# Patient Record
Sex: Female | Born: 1960 | Race: White | Hispanic: No | Marital: Single | Smoking: Never smoker
Health system: Southern US, Community
[De-identification: ages and names within clinical notes are randomized; demographics above are authoritative.]

## PROBLEM LIST (undated history)

## (undated) DIAGNOSIS — F250 Schizoaffective disorder, bipolar type: Secondary | ICD-10-CM

## (undated) DIAGNOSIS — I639 Cerebral infarction, unspecified: Secondary | ICD-10-CM

## (undated) DIAGNOSIS — J449 Chronic obstructive pulmonary disease, unspecified: Secondary | ICD-10-CM

## (undated) DIAGNOSIS — I10 Essential (primary) hypertension: Secondary | ICD-10-CM

## (undated) DIAGNOSIS — F32A Depression, unspecified: Secondary | ICD-10-CM

## (undated) DIAGNOSIS — F209 Schizophrenia, unspecified: Secondary | ICD-10-CM

## (undated) DIAGNOSIS — G2401 Drug induced subacute dyskinesia: Secondary | ICD-10-CM

## (undated) DIAGNOSIS — E039 Hypothyroidism, unspecified: Secondary | ICD-10-CM

## (undated) DIAGNOSIS — F329 Major depressive disorder, single episode, unspecified: Secondary | ICD-10-CM

## (undated) HISTORY — PX: NO PAST SURGERIES: SHX2092

---

## 2008-01-21 ENCOUNTER — Emergency Department (HOSPITAL_BASED_OUTPATIENT_CLINIC_OR_DEPARTMENT_OTHER): Admission: EM | Admit: 2008-01-21 | Discharge: 2008-01-21 | Payer: Self-pay | Admitting: Emergency Medicine

## 2014-10-07 ENCOUNTER — Emergency Department (HOSPITAL_COMMUNITY)
Admission: EM | Admit: 2014-10-07 | Discharge: 2014-10-10 | Disposition: A | Discharge status: Home / Self Care | Payer: Medicare Other | Attending: Emergency Medicine | Admitting: Emergency Medicine

## 2014-10-07 ENCOUNTER — Encounter (HOSPITAL_COMMUNITY): Payer: Self-pay | Admitting: Emergency Medicine

## 2014-10-07 DIAGNOSIS — F29 Unspecified psychosis not due to a substance or known physiological condition: Secondary | ICD-10-CM | POA: Diagnosis not present

## 2014-10-07 DIAGNOSIS — Z79899 Other long term (current) drug therapy: Secondary | ICD-10-CM | POA: Diagnosis not present

## 2014-10-07 DIAGNOSIS — Z8673 Personal history of transient ischemic attack (TIA), and cerebral infarction without residual deficits: Secondary | ICD-10-CM | POA: Diagnosis not present

## 2014-10-07 DIAGNOSIS — Z8669 Personal history of other diseases of the nervous system and sense organs: Secondary | ICD-10-CM | POA: Diagnosis not present

## 2014-10-07 DIAGNOSIS — Z794 Long term (current) use of insulin: Secondary | ICD-10-CM | POA: Insufficient documentation

## 2014-10-07 DIAGNOSIS — J449 Chronic obstructive pulmonary disease, unspecified: Secondary | ICD-10-CM | POA: Insufficient documentation

## 2014-10-07 DIAGNOSIS — F200 Paranoid schizophrenia: Secondary | ICD-10-CM | POA: Diagnosis not present

## 2014-10-07 DIAGNOSIS — I10 Essential (primary) hypertension: Secondary | ICD-10-CM | POA: Insufficient documentation

## 2014-10-07 DIAGNOSIS — Z3202 Encounter for pregnancy test, result negative: Secondary | ICD-10-CM | POA: Diagnosis not present

## 2014-10-07 DIAGNOSIS — E039 Hypothyroidism, unspecified: Secondary | ICD-10-CM | POA: Diagnosis not present

## 2014-10-07 DIAGNOSIS — Z008 Encounter for other general examination: Secondary | ICD-10-CM | POA: Diagnosis present

## 2014-10-07 DIAGNOSIS — F329 Major depressive disorder, single episode, unspecified: Secondary | ICD-10-CM | POA: Insufficient documentation

## 2014-10-07 HISTORY — DX: Depression, unspecified: F32.A

## 2014-10-07 HISTORY — DX: Chronic obstructive pulmonary disease, unspecified: J44.9

## 2014-10-07 HISTORY — DX: Essential (primary) hypertension: I10

## 2014-10-07 HISTORY — DX: Hypothyroidism, unspecified: E03.9

## 2014-10-07 HISTORY — DX: Cerebral infarction, unspecified: I63.9

## 2014-10-07 HISTORY — DX: Drug induced subacute dyskinesia: G24.01

## 2014-10-07 HISTORY — DX: Major depressive disorder, single episode, unspecified: F32.9

## 2014-10-07 LAB — RAPID URINE DRUG SCREEN, HOSP PERFORMED
Amphetamines: NOT DETECTED
BARBITURATES: NOT DETECTED
Benzodiazepines: NOT DETECTED
Cocaine: NOT DETECTED
OPIATES: NOT DETECTED
TETRAHYDROCANNABINOL: NOT DETECTED

## 2014-10-07 LAB — CBC WITH DIFFERENTIAL/PLATELET
BASOS ABS: 0 10*3/uL (ref 0.0–0.1)
BASOS PCT: 0 % (ref 0–1)
EOS ABS: 0.2 10*3/uL (ref 0.0–0.7)
Eosinophils Relative: 2 % (ref 0–5)
HCT: 38.6 % (ref 36.0–46.0)
Hemoglobin: 13 g/dL (ref 12.0–15.0)
LYMPHS PCT: 34 % (ref 12–46)
Lymphs Abs: 4.1 10*3/uL — ABNORMAL HIGH (ref 0.7–4.0)
MCH: 32.4 pg (ref 26.0–34.0)
MCHC: 33.7 g/dL (ref 30.0–36.0)
MCV: 96.3 fL (ref 78.0–100.0)
MONOS PCT: 7 % (ref 3–12)
Monocytes Absolute: 0.9 10*3/uL (ref 0.1–1.0)
NEUTROS ABS: 6.8 10*3/uL (ref 1.7–7.7)
NEUTROS PCT: 57 % (ref 43–77)
Platelets: 245 10*3/uL (ref 150–400)
RBC: 4.01 MIL/uL (ref 3.87–5.11)
RDW: 13.1 % (ref 11.5–15.5)
WBC: 12 10*3/uL — ABNORMAL HIGH (ref 4.0–10.5)

## 2014-10-07 LAB — URINALYSIS, ROUTINE W REFLEX MICROSCOPIC
BILIRUBIN URINE: NEGATIVE
GLUCOSE, UA: NEGATIVE mg/dL
HGB URINE DIPSTICK: NEGATIVE
Ketones, ur: NEGATIVE mg/dL
Leukocytes, UA: NEGATIVE
NITRITE: NEGATIVE
PH: 5.5 (ref 5.0–8.0)
Protein, ur: NEGATIVE mg/dL
SPECIFIC GRAVITY, URINE: 1.009 (ref 1.005–1.030)
Urobilinogen, UA: 0.2 mg/dL (ref 0.0–1.0)

## 2014-10-07 LAB — COMPREHENSIVE METABOLIC PANEL
ALBUMIN: 4.4 g/dL (ref 3.5–5.0)
ALT: 15 U/L (ref 14–54)
AST: 21 U/L (ref 15–41)
Alkaline Phosphatase: 78 U/L (ref 38–126)
Anion gap: 11 (ref 5–15)
BILIRUBIN TOTAL: 0.7 mg/dL (ref 0.3–1.2)
BUN: 23 mg/dL — AB (ref 6–20)
CALCIUM: 9.7 mg/dL (ref 8.9–10.3)
CO2: 24 mmol/L (ref 22–32)
Chloride: 102 mmol/L (ref 101–111)
Creatinine, Ser: 0.91 mg/dL (ref 0.44–1.00)
GLUCOSE: 129 mg/dL — AB (ref 65–99)
Potassium: 4.3 mmol/L (ref 3.5–5.1)
Sodium: 137 mmol/L (ref 135–145)
TOTAL PROTEIN: 7.4 g/dL (ref 6.5–8.1)

## 2014-10-07 LAB — TSH: TSH: 15.183 u[IU]/mL — AB (ref 0.350–4.500)

## 2014-10-07 LAB — PREGNANCY, URINE: PREG TEST UR: NEGATIVE

## 2014-10-07 LAB — ACETAMINOPHEN LEVEL: Acetaminophen (Tylenol), Serum: <10 ug/mL — ABNORMAL LOW (ref 10–30)

## 2014-10-07 LAB — ETHANOL

## 2014-10-07 LAB — SALICYLATE LEVEL

## 2014-10-07 MED ORDER — LORATADINE 10 MG PO TABS
10.0000 mg | ORAL_TABLET | Freq: Every day | ORAL | Status: DC
  Administered 2014-10-08 – 2014-10-10 (×3): 10 mg via ORAL
  Filled 2014-10-07 (×2): qty 1

## 2014-10-07 MED ORDER — FLUPHENAZINE HCL 5 MG PO TABS
5.0000 mg | ORAL_TABLET | Freq: Two times a day (BID) | ORAL | Status: DC
  Administered 2014-10-07 – 2014-10-10 (×6): 5 mg via ORAL
  Filled 2014-10-07 (×8): qty 1

## 2014-10-07 MED ORDER — POTASSIUM CHLORIDE CRYS ER 10 MEQ PO TBCR
10.0000 meq | EXTENDED_RELEASE_TABLET | Freq: Every day | ORAL | Status: DC
  Administered 2014-10-07 – 2014-10-10 (×4): 10 meq via ORAL
  Filled 2014-10-07 (×4): qty 1

## 2014-10-07 MED ORDER — ZIPRASIDONE MESYLATE 20 MG IM SOLR
20.0000 mg | Freq: Once | INTRAMUSCULAR | Status: AC
  Administered 2014-10-07: 20 mg via INTRAMUSCULAR
  Filled 2014-10-07: qty 20

## 2014-10-07 MED ORDER — LEVOTHYROXINE SODIUM 100 MCG PO TABS
100.0000 ug | ORAL_TABLET | Freq: Every day | ORAL | Status: DC
  Administered 2014-10-08 – 2014-10-10 (×3): 100 ug via ORAL
  Filled 2014-10-07 (×4): qty 1

## 2014-10-07 MED ORDER — MIRTAZAPINE 30 MG PO TABS
15.0000 mg | ORAL_TABLET | Freq: Every day | ORAL | Status: DC
  Administered 2014-10-07 – 2014-10-09 (×3): 15 mg via ORAL
  Filled 2014-10-07 (×3): qty 1

## 2014-10-07 MED ORDER — FUROSEMIDE 40 MG PO TABS
20.0000 mg | ORAL_TABLET | Freq: Every day | ORAL | Status: DC
  Administered 2014-10-07 – 2014-10-10 (×4): 20 mg via ORAL
  Filled 2014-10-07: qty 1
  Filled 2014-10-07: qty 0.5
  Filled 2014-10-07: qty 1
  Filled 2014-10-07: qty 0.5

## 2014-10-07 MED ORDER — LORAZEPAM 2 MG/ML IJ SOLN
2.0000 mg | Freq: Once | INTRAMUSCULAR | Status: AC
  Administered 2014-10-07: 2 mg via INTRAMUSCULAR
  Filled 2014-10-07: qty 1

## 2014-10-07 MED ORDER — FLUPHENAZINE HCL 10 MG PO TABS
10.0000 mg | ORAL_TABLET | Freq: Every day | ORAL | Status: DC
  Administered 2014-10-07 – 2014-10-09 (×3): 10 mg via ORAL
  Filled 2014-10-07 (×4): qty 1

## 2014-10-07 MED ORDER — STERILE WATER FOR INJECTION IJ SOLN
INTRAMUSCULAR | Status: AC
  Administered 2014-10-07: 1.2 mL
  Filled 2014-10-07: qty 10

## 2014-10-07 MED ORDER — LORAZEPAM 1 MG PO TABS
1.0000 mg | ORAL_TABLET | Freq: Three times a day (TID) | ORAL | Status: DC | PRN
  Administered 2014-10-07 – 2014-10-10 (×3): 1 mg via ORAL
  Filled 2014-10-07 (×3): qty 1

## 2014-10-07 MED ORDER — ALBUTEROL SULFATE HFA 108 (90 BASE) MCG/ACT IN AERS
2.0000 | INHALATION_SPRAY | Freq: Four times a day (QID) | RESPIRATORY_TRACT | Status: DC | PRN
  Administered 2014-10-08: 2 via RESPIRATORY_TRACT
  Filled 2014-10-07: qty 6.7

## 2014-10-07 MED ORDER — FAMOTIDINE 20 MG PO TABS
20.0000 mg | ORAL_TABLET | Freq: Two times a day (BID) | ORAL | Status: DC
  Administered 2014-10-07 – 2014-10-10 (×7): 20 mg via ORAL
  Filled 2014-10-07 (×7): qty 1

## 2014-10-07 MED ORDER — LOSARTAN POTASSIUM 25 MG PO TABS
25.0000 mg | ORAL_TABLET | Freq: Every day | ORAL | Status: DC
  Administered 2014-10-08 – 2014-10-10 (×3): 25 mg via ORAL
  Filled 2014-10-07 (×3): qty 1

## 2014-10-07 MED ORDER — METFORMIN HCL 500 MG PO TABS
1000.0000 mg | ORAL_TABLET | Freq: Two times a day (BID) | ORAL | Status: DC
  Administered 2014-10-07 – 2014-10-10 (×7): 1000 mg via ORAL
  Filled 2014-10-07 (×8): qty 2

## 2014-10-07 MED ORDER — INSULIN GLARGINE 100 UNIT/ML ~~LOC~~ SOLN
15.0000 [IU] | Freq: Every day | SUBCUTANEOUS | Status: DC
  Administered 2014-10-08 – 2014-10-10 (×3): 15 [IU] via SUBCUTANEOUS
  Filled 2014-10-07 (×3): qty 0.15

## 2014-10-07 NOTE — ED Notes (Signed)
Pt defecated large stool on floor. Cleaned by environmental.
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
## 2014-10-07 NOTE — ED Notes (Signed)
Patient restless and coming out to nursing station. Staff redirected pt to room. No s/s of distress noted.
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
## 2014-10-07 NOTE — ED Notes (Signed)
Patient continues to scream, yell, and be in a state of high agitation. Patient ambulatory without difficulty.

## 2014-10-07 NOTE — ED Notes (Addendum)
GUARDIAN/ Judeth CornfieldSISTER- [facility]  HOME [phone] MOBILE [phone] WORK [phone]

## 2014-10-08 DIAGNOSIS — F29 Unspecified psychosis not due to a substance or known physiological condition: Secondary | ICD-10-CM | POA: Diagnosis not present

## 2014-10-08 LAB — CBG MONITORING, ED: Glucose-Capillary: 145 mg/dL — ABNORMAL HIGH (ref 65–99)

## 2014-10-08 NOTE — ED Notes (Signed)
MD and NP bedside

## 2014-10-09 DIAGNOSIS — F29 Unspecified psychosis not due to a substance or known physiological condition: Secondary | ICD-10-CM | POA: Insufficient documentation

## 2014-10-09 DIAGNOSIS — F200 Paranoid schizophrenia: Secondary | ICD-10-CM | POA: Diagnosis present

## 2014-10-09 LAB — CBG MONITORING, ED: Glucose-Capillary: 136 mg/dL — ABNORMAL HIGH (ref 65–99)
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
## 2014-10-09 NOTE — Progress Notes (Signed)
CSW contacted outside facilities which were all at capacity.  CSW faxed referrals to the following inpatient psych facilities that are at capacity, but taking referrals:  [facility]  Addelyn Alleman LCSW,LCAS Behavioral Health Disposition CSW [phone]   

## 2014-10-09 NOTE — ED Notes (Addendum)
Behavioral Health called to inquire if patient is on oxygen.  Informed caller patient is on room air.
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
## 2014-10-09 NOTE — ED Notes (Signed)
Patient ate 100% of her lunch. 

## 2014-10-09 NOTE — ED Notes (Signed)
Pt waiting on placement. Pt is wanting all her medications with the side effects and the reasons for pt to be on the medications. Pt would also like to see the doctor before 11am today. Pt calm at this time just demanding when talked to . Sitter at bedside. Educated pt that we would have the doctor come and talk to her just unsure on the time frame. I will also ask the pharmacy to send information on pts medications.

## 2014-10-09 NOTE — ED Notes (Signed)
Patient very verbal asking for lunch.  Informed patient it should be another 30 minutes before lunch arrives.  Patient states she will remind nurse in 30 minutes. Resting on stretcher.

## 2014-10-09 NOTE — ED Notes (Signed)
Pt ate 100% of her lunch at this time 

## 2014-10-10 DIAGNOSIS — F29 Unspecified psychosis not due to a substance or known physiological condition: Secondary | ICD-10-CM | POA: Diagnosis not present

## 2014-10-10 LAB — CBG MONITORING, ED
GLUCOSE-CAPILLARY: 127 mg/dL — AB (ref 65–99)
Glucose-Capillary: 104 mg/dL — ABNORMAL HIGH (ref 65–99)
Glucose-Capillary: 119 mg/dL — ABNORMAL HIGH (ref 65–99)
Glucose-Capillary: 134 mg/dL — ABNORMAL HIGH (ref 65–99)

## 2014-10-10 LAB — T4, FREE: Free T4: 1.08 ng/dL (ref 0.61–1.12)

## 2014-10-10 MED ORDER — ACETAMINOPHEN 325 MG PO TABS
650.0000 mg | ORAL_TABLET | Freq: Once | ORAL | Status: DC
  Filled 2014-10-10: qty 2

## 2014-10-10 NOTE — ED Notes (Signed)
[facility] called for transport request.

## 2014-10-10 NOTE — Consult Note (Signed)
[facility] Face-to-Face Psychiatry Consult   Reason for Consult:  Delusions Referring Physician:  EDP Patient Identification: Carrie Clark MRN:  [id number] Principal Diagnosis: Schizophrenia, paranoid Diagnosis:   Patient Active Problem List   Diagnosis Date Noted  . 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 Psychoses [F29]     Total Time spent with patient: 30 minutes  Subjective:   Carrie Clark is a 54 y.o. female patient does not warrant admission.  HPI:  The patient has stabilized.  Denies suicidal/homicidal ideations, hallucinations, and alcohol/drug abuse.  She is delusional at times over somatic concerns but not a threat to herself.  The psychiatrist feels she is stable to return to her assisted living, [facility]. HPI Elements:   Location:  generalized. Quality:  acute. Severity:  mild. Timing:  intermittent. Duration:  few days. Context:  stressors.  Past Medical History:  Past Medical History  Diagnosis Date  . Tardive dyskinesia   . COPD (chronic obstructive pulmonary disease)   . Depression   . Hypertension   . Hypothyroidism   . CVA (cerebral infarction)    History reviewed. No pertinent past surgical history. Family History: History reviewed. No pertinent family history. Social History:  History  Alcohol Use No     History  Drug Use No    History   Social History  . Marital Status: Unknown    Spouse Name: N/A  . Number of Children: N/A  . Years of Education: N/A   Social History Main Topics  . Smoking status: Never Smoker   . Smokeless tobacco: Not on file  . Alcohol Use: No  . Drug Use: No  . Sexual Activity: Not on file   Other Topics Concern  . None   Social History Narrative  . None   Additional Social History:    Pain Medications: none History of alcohol / drug use?: No history of alcohol / drug abuse                     Allergies:   Allergies  Allergen Reactions  . Fish Allergy Other (See Comments)   Allergy reaction not listed    Labs:  Results for orders placed or performed during the hospital encounter of 10/07/14 (from the past 48 hour(s))  CBG monitoring, ED     Status: Abnormal   Collection Time: 10/09/14  1:29 PM  Result Value Ref Range   Glucose-Capillary 136 (H) 65 - 99 mg/dL  POC CBG, ED     Status: Abnormal   Collection Time: 10/09/14  5:50 PM  Result Value Ref Range   Glucose-Capillary 127 (H) 65 - 99 mg/dL   Comment 1 Notify RN    Comment 2 Document in Chart   POC CBG, ED     Status: Abnormal   Collection Time: 10/10/14  7:26 AM  Result Value Ref Range   Glucose-Capillary 104 (H) 65 - 99 mg/dL   Comment 1 Notify RN   T4, free     Status: None   Collection Time: 10/10/14  9:34 AM  Result Value Ref Range   Free T4 1.08 0.61 - 1.12 ng/dL    Comment: Performed at [facility]  CBG monitoring, ED     Status: Abnormal   Collection Time: 10/10/14 11:57 AM  Result Value Ref Range   Glucose-Capillary 119 (H) 65 - 99 mg/dL   Comment 1 Notify RN     Vitals: Blood pressure 106/57, pulse 85, temperature  97.8 F (36.6 C), temperature source Oral, resp. rate 18, SpO2 98 %.  Risk to Self: Suicidal Ideation: No-Not Currently/Within Last 6 Months Suicidal Intent: No-Not Currently/Within Last 6 Months Is patient at risk for suicide?: No Suicidal Plan?: No-Not Currently/Within Last 6 Months Access to Means: No What has been your use of drugs/alcohol within the last 12 months?: none Intentional Self Injurious Behavior: None Risk to Others: Homicidal Ideation: No-Not Currently/Within Last 6 Months Thoughts of Harm to Others: No-Not Currently Present/Within Last 6 Months Current Homicidal Intent: No-Not Currently/Within Last 6 Months Current Homicidal Plan: No-Not Currently/Within Last 6 Months Access to Homicidal Means: No History of harm to others?: No Assessment of Violence: On admission Violent Behavior Description: yelling, screaming,  Does patient have access  to weapons?: No Criminal Charges Pending?: No Does patient have a court date: No Prior Inpatient Therapy: Prior Inpatient Therapy: Yes Prior Therapy Dates: unknown Prior Therapy Facilty/Provider(s): unknown Reason for Treatment: Schizophrenia Prior Outpatient Therapy: Prior Outpatient Therapy: Yes Prior Therapy Dates: current Prior Therapy Facilty/Provider(s): Dr. Rana SnareZanic Reason for Treatment: Schizophrenia Does patient have an ACCT team?: Unknown Does patient have Intensive In-House Services?  : No Does patient have [facility] services? : No Does patient have P4CC services?: No  Current Facility-Administered Medications  Medication Dose Route Frequency Provider Last Rate Last Dose  . acetaminophen (TYLENOL) tablet 650 mg  650 mg Oral Once Charm RingsJamison Y Lord, NP      . albuterol (PROVENTIL HFA;VENTOLIN HFA) 108 (90 BASE) MCG/ACT inhaler 2 puff  2 puff Inhalation Q6H PRN Benjiman CoreNathan Pickering, MD   2 puff at 10/08/14 2304  . famotidine (PEPCID) tablet 20 mg  20 mg Oral BID Benjiman CoreNathan Pickering, MD   20 mg at 10/10/14 0951  . fluPHENAZine (PROLIXIN) tablet 10 mg  10 mg Oral QHS Benjiman CoreNathan Pickering, MD   10 mg at 10/09/14 2122  . fluPHENAZine (PROLIXIN) tablet 5 mg  5 mg Oral BID Benjiman CoreNathan Pickering, MD   5 mg at 10/10/14 [id number]  . furosemide (LASIX) tablet 20 mg  20 mg Oral Daily Benjiman CoreNathan Pickering, MD   20 mg at 10/10/14 0951  . insulin glargine (LANTUS) injection 15 Units  15 Units Subcutaneous Daily Benjiman CoreNathan Pickering, MD   15 Units at 10/10/14 [phone]  . levothyroxine (SYNTHROID, LEVOTHROID) tablet 100 mcg  100 mcg Oral QAC breakfast Benjiman CoreNathan Pickering, MD   100 mcg at 10/10/14 [id number]  . loratadine (CLARITIN) tablet 10 mg  10 mg Oral Daily Benjiman CoreNathan Pickering, MD   10 mg at 10/10/14 [id number]  . LORazepam (ATIVAN) tablet 1 mg  1 mg Oral Q8H PRN Benjiman CoreNathan Pickering, MD   1 mg at 10/10/14 1628  . losartan (COZAAR) tablet 25 mg  25 mg Oral Daily Benjiman CoreNathan Pickering, MD   25 mg at 10/10/14 0807  . metFORMIN (GLUCOPHAGE) tablet 1,000 mg   1,000 mg Oral BID WC Benjiman CoreNathan Pickering, MD   1,000 mg at 10/10/14 1629  . mirtazapine (REMERON) tablet 15 mg  15 mg Oral QHS Benjiman CoreNathan Pickering, MD   15 mg at 10/09/14 2121  . potassium chloride (K-DUR,KLOR-CON) CR tablet 10 mEq  10 mEq Oral Daily Benjiman CoreNathan Pickering, MD   10 mEq at 10/10/14 [id number]   Current Outpatient Prescriptions  Medication Sig Dispense Refill  . albuterol (PROVENTIL HFA;VENTOLIN HFA) 108 (90 BASE) MCG/ACT inhaler Inhale 2 puffs into the lungs every 6 (six) hours as needed for wheezing or shortness of breath.    . cetirizine (ZYRTEC) 10 MG tablet Take 10 mg by  mouth daily.    . famotidine (PEPCID) 20 MG tablet Take 20 mg by mouth 2 (two) times daily.    . fluPHENAZine (PROLIXIN) 10 MG tablet Take 10 mg by mouth at bedtime.    . fluPHENAZine (PROLIXIN) 5 MG tablet Take 5 mg by mouth 2 (two) times daily.    . fluticasone (FLONASE) 50 MCG/ACT nasal spray Place 50 sprays into both nostrils 2 (two) times daily as needed.    . furosemide (LASIX) 20 MG tablet Take 20 mg by mouth daily.    Marland Kitchen. ibuprofen (ADVIL,MOTRIN) 400 MG tablet Take 400 mg by mouth 2 (two) times daily as needed for moderate pain.    Marland Kitchen. LANTUS 100 UNIT/ML injection Inject 100 mLs as directed daily.    Marland Kitchen. levothyroxine (SYNTHROID, LEVOTHROID) 50 MCG tablet Take 50 mcg by mouth daily.    Marland Kitchen. LORazepam (ATIVAN) 0.5 MG tablet Take 0.5 mg by mouth 3 times/day as needed-between meals & bedtime for anxiety or sedation.     Marland Kitchen. losartan (COZAAR) 25 MG tablet Take 25 mg by mouth daily.    . Menthol, Topical Analgesic, (BENGAY EX) Apply 1 application topically 3 (three) times daily as needed (neck pain).    . metFORMIN (GLUCOPHAGE) 1000 MG tablet Take 1,000 mg by mouth 2 (two) times daily.    . mirtazapine (REMERON) 15 MG tablet Take 15 mg by mouth at bedtime.    . Multiple Vitamins-Minerals (MULTIVITAMIN WITH MINERALS) tablet Take 1 tablet by mouth daily.    . ondansetron (ZOFRAN-ODT) 4 MG disintegrating tablet Take 4 mg by mouth 3  (three) times daily as needed.    . potassium chloride (K-DUR,KLOR-CON) 10 MEQ tablet Take 10 mEq by mouth daily.      Musculoskeletal: Strength & Muscle Tone: within normal limits Gait & Station: normal Patient leans: N/A  Psychiatric Specialty Exam: Physical Exam  Review of Systems  Constitutional: Negative.   HENT: Negative.   Eyes: Negative.   Respiratory: Negative.   Cardiovascular: Negative.   Gastrointestinal: Negative.   Genitourinary: Negative.   Musculoskeletal: Negative.   Skin: Negative.   Neurological: Negative.   Endo/Heme/Allergies: Negative.   Psychiatric/Behavioral: The patient has insomnia.     Blood pressure 106/57, pulse 85, temperature 97.8 F (36.6 C), temperature source Oral, resp. rate 18, SpO2 98 %.There is no height or weight on file to calculate BMI.  General Appearance: Casual  Eye Contact::  Good  Speech:  Normal Rate  Volume:  Normal  Mood:  Euthymic  Affect:  Blunt  Thought Process:  Coherent  Orientation:  Full (Time, Place, and Person)  Thought Content:  Delusions  Suicidal Thoughts:  No  Homicidal Thoughts:  No  Memory:  Immediate;   Fair Recent;   Fair Remote;   Fair  Judgement:  Fair  Insight:  Fair  Psychomotor Activity:  Normal  Concentration:  Good  Recall:  Good  Fund of Knowledge:  Good  Language: Good  Akathisia:  No  Handed:  Right  AIMS (if indicated):     Assets:  Financial Resources/Insurance Housing Leisure Time Resilience Social Support  ADL's:  Intact  Cognition: WNL  Sleep:      Medical Decision Making: Review of Psycho-Social Stressors (1), Review or order clinical lab tests (1) and Review of Medication Regimen & Side Effects (2)  Treatment Plan Summary: Daily contact with patient to assess and evaluate symptoms and progress in treatment, Medication management and Plan DIscharge to [facility], SNF with follow-up at her  regular providers  Plan:  No evidence of imminent risk to self or others at present.    Disposition: DIscharge to [facility], assisted living, with follow-up at her regular providers  Nanine Means, PMH-NP 10/10/2014 4:49 PM Patient seen face-to-face for psychiatric evaluation, chart reviewed and case discussed with the physician extender and developed treatment plan. Reviewed the information documented and agree with the treatment plan. Thedore Mins, MD

## 2014-10-10 NOTE — Progress Notes (Signed)
CSW completed FL2 for pt and [facility] through [facility].  [facility] and [facility] at [phone].  Trish MageBrittney Rodolphe Edmonston, LCSWA [phone] ED CSW 10/10/2014 7:03 PM

## 2014-10-10 NOTE — [facility] Counselor (Signed)
Called [facility] at [phone] to let them know about discharge plans- can call the same number for report.   Kateri PlummerKristin Dusten Ellinwood, M.S., LPCA, [place]ASA, [facility] Licensed Professional Counselor Associate  Triage Specialist  [facility]  Therapeutic Triage Services Phone: [phone] Fax: [phone]

## 2014-10-10 NOTE — ED Notes (Signed)
45 Patient pacing in room, coming out in nursing station asking for water or ice chips, patient instructed that is was night time, that she should lay down and take a nap, patient states " I 'll try"  At  [phone] Patient awake, has rested for a short time tonight talking to self at this time, laughing, clapping hands.

## 2014-10-10 NOTE — [facility] Suicide Risk Assessment (Signed)
Suicide Risk Assessment  Discharge [facility]   [facility]   Demographic Factors:  Caucasian  Total Time spent with patient: 30 minutes  Musculoskeletal: Strength & Muscle Tone: within normal limits Gait & Station: normal Patient leans: N/A  Psychiatric Specialty Exam: Physical Exam  Review of Systems  Constitutional: Negative.   HENT: Negative.   Eyes: Negative.   Respiratory: Negative.   Cardiovascular: Negative.   Gastrointestinal: Negative.   Genitourinary: Negative.   Musculoskeletal: Negative.   Skin: Negative.   Neurological: Negative.   Endo/Heme/Allergies: Negative.   Psychiatric/Behavioral: The patient has insomnia.     Blood pressure 106/57, pulse 85, temperature 97.8 F (36.6 C), temperature source Oral, resp. rate 18, SpO2 98 %.There is no height or weight on file to calculate BMI.  General Appearance: Casual  Eye Contact::  Good  Speech:  Normal Rate  Volume:  Normal  Mood:  Euthymic  Affect:  Blunt  Thought Process:  Coherent  Orientation:  Full (Time, Place, and Person)  Thought Content:  Delusions  Suicidal Thoughts:  No  Homicidal Thoughts:  No  Memory:  Immediate;   Fair Recent;   Fair Remote;   Fair  Judgement:  Fair  Insight:  Fair  Psychomotor Activity:  Normal  Concentration:  Good  Recall:  Good  Fund of Knowledge:  Good  Language: Good  Akathisia:  No  Handed:  Right  AIMS (if indicated):     Assets:  Financial Resources/Insurance Housing Leisure Time Resilience Social Support  ADL's:  Intact  Cognition: WNL  Sleep:      Has this patient used any form of tobacco in the last 30 days? (Cigarettes, Smokeless Tobacco, Cigars, and/or Pipes) No  Mental Status Per Nursing Assessment::   On Admission:   Delusional  Current Mental Status by Physician: NA  Loss Factors: NA  Historical Factors: NA  Risk Reduction Factors:   Sense of responsibility to family, Living with another person, especially  a relative, Positive social support and Positive therapeutic relationship  Continued Clinical Symptoms:  Somatic delusions at times  Cognitive Features That Contribute To Risk:  None    Suicide Risk:  Minimal: No identifiable suicidal ideation.  Patients presenting with no risk factors but with morbid ruminations; may be classified as minimal risk based on the severity of the depressive symptoms  Principal Problem: Schizophrenia, paranoid Discharge Diagnoses:  Patient Active Problem List   Diagnosis Date Noted  . 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 Psychoses [F29]       Plan Of Care/Follow-up recommendations:  Activity:  as tolerated Diet:  heart healthy  Is patient on multiple antipsychotic therapies at discharge:  No   Has Patient had three or more failed trials of antipsychotic monotherapy by history:  No  Recommended Plan for Multiple Antipsychotic Therapies: NA    Chad Donoghue, PMH-NP 10/10/2014, 4:55 PM

## 2014-10-10 NOTE — ED Notes (Addendum)
Pt is very pleasant reading the bible this am. Pt eats very fast and has to be reminded to slow down so she does not chock. Pt at times has garbled speech but this is normal for her. Pt is a 1:1 this am for safety.pt stated he has been drinking since the age of 54. Pt also stated he has not followed up with any of his referrals and is not sure why. He di tell the MD and NP he wants longterm treatment. Pt FSBS is 119 at 12noon. Pt remains with a sitter and remains a 1:1. Pt is concerned and fixated on her blood volume and having someone bring in a machine to check it.12:45p- Pt was given 650mg  of tylenol for a headache 6/10. Pt at times can be very loud and demanding;1:35pm-Pt stated her headache is gone.

## 2014-11-11 ENCOUNTER — Encounter (HOSPITAL_COMMUNITY): Payer: Self-pay

## 2014-11-11 ENCOUNTER — Emergency Department (HOSPITAL_COMMUNITY)
Admission: EM | Admit: 2014-11-11 | Discharge: 2014-11-11 | Disposition: A | Discharge status: Home / Self Care | Payer: Medicare Other | Attending: Emergency Medicine | Admitting: Emergency Medicine

## 2014-11-11 DIAGNOSIS — E039 Hypothyroidism, unspecified: Secondary | ICD-10-CM | POA: Diagnosis not present

## 2014-11-11 DIAGNOSIS — J449 Chronic obstructive pulmonary disease, unspecified: Secondary | ICD-10-CM | POA: Diagnosis not present

## 2014-11-11 DIAGNOSIS — M549 Dorsalgia, unspecified: Secondary | ICD-10-CM | POA: Insufficient documentation

## 2014-11-11 DIAGNOSIS — Z8673 Personal history of transient ischemic attack (TIA), and cerebral infarction without residual deficits: Secondary | ICD-10-CM | POA: Diagnosis not present

## 2014-11-11 DIAGNOSIS — R4689 Other symptoms and signs involving appearance and behavior: Secondary | ICD-10-CM | POA: Diagnosis present

## 2014-11-11 DIAGNOSIS — Z8669 Personal history of other diseases of the nervous system and sense organs: Secondary | ICD-10-CM | POA: Insufficient documentation

## 2014-11-11 DIAGNOSIS — Z79899 Other long term (current) drug therapy: Secondary | ICD-10-CM | POA: Insufficient documentation

## 2014-11-11 DIAGNOSIS — F200 Paranoid schizophrenia: Secondary | ICD-10-CM

## 2014-11-11 DIAGNOSIS — F918 Other conduct disorders: Secondary | ICD-10-CM | POA: Diagnosis not present

## 2014-11-11 DIAGNOSIS — I10 Essential (primary) hypertension: Secondary | ICD-10-CM | POA: Insufficient documentation

## 2014-11-11 DIAGNOSIS — Z794 Long term (current) use of insulin: Secondary | ICD-10-CM | POA: Diagnosis not present

## 2014-11-11 DIAGNOSIS — R45851 Suicidal ideations: Secondary | ICD-10-CM | POA: Diagnosis present

## 2014-11-11 LAB — COMPREHENSIVE METABOLIC PANEL
ALBUMIN: 3.9 g/dL (ref 3.5–5.0)
ALK PHOS: 86 U/L (ref 38–126)
ALT: 15 U/L (ref 14–54)
ANION GAP: 11 (ref 5–15)
AST: 19 U/L (ref 15–41)
BUN: 9 mg/dL (ref 6–20)
CO2: 25 mmol/L (ref 22–32)
Calcium: 9.7 mg/dL (ref 8.9–10.3)
Chloride: 103 mmol/L (ref 101–111)
Creatinine, Ser: 0.72 mg/dL (ref 0.44–1.00)
GFR calc Af Amer: >60 mL/min (ref >60)
GFR calc non Af Amer: >60 mL/min (ref >60)
Glucose, Bld: 169 mg/dL — ABNORMAL HIGH (ref 65–99)
POTASSIUM: 3.6 mmol/L (ref 3.5–5.1)
SODIUM: 139 mmol/L (ref 135–145)
TOTAL PROTEIN: 6.9 g/dL (ref 6.5–8.1)
Total Bilirubin: 0.3 mg/dL (ref 0.3–1.2)

## 2014-11-11 LAB — CBC
HCT: 38.4 % (ref 36.0–46.0)
Hemoglobin: 12.5 g/dL (ref 12.0–15.0)
MCH: 30.5 pg (ref 26.0–34.0)
MCHC: 32.6 g/dL (ref 30.0–36.0)
MCV: 93.7 fL (ref 78.0–100.0)
PLATELETS: 289 10*3/uL (ref 150–400)
RBC: 4.1 MIL/uL (ref 3.87–5.11)
RDW: 12.9 % (ref 11.5–15.5)
WBC: 11.2 10*3/uL — AB (ref 4.0–10.5)

## 2014-11-11 LAB — ETHANOL

## 2014-11-11 LAB — SALICYLATE LEVEL: Salicylate Lvl: <4 mg/dL (ref 2.8–30.0)

## 2014-11-11 LAB — ACETAMINOPHEN LEVEL

## 2014-11-11 MED ORDER — FLUTICASONE PROPIONATE 50 MCG/ACT NA SUSP: 50.0000 | Freq: Two times a day (BID) | NASAL | Status: DC | PRN

## 2014-11-11 MED ORDER — LOSARTAN POTASSIUM 25 MG PO TABS: 25.0000 mg | ORAL_TABLET | Freq: Every day | ORAL | Status: DC

## 2014-11-11 MED ORDER — LEVOTHYROXINE SODIUM 50 MCG PO TABS: 50.0000 ug | ORAL_TABLET | Freq: Every day | ORAL | Status: DC

## 2014-11-11 MED ORDER — CETIRIZINE HCL 10 MG PO TABS: 10.0000 mg | ORAL_TABLET | Freq: Every day | ORAL | Status: DC

## 2014-11-11 MED ORDER — FUROSEMIDE 20 MG PO TABS: 20.0000 mg | ORAL_TABLET | Freq: Every day | ORAL | Status: AC

## 2014-11-11 MED ORDER — NICOTINE 21 MG/24HR TD PT24
21.0000 mg | MEDICATED_PATCH | Freq: Every day | TRANSDERMAL | Status: DC
  Administered 2014-11-11: 21 mg via TRANSDERMAL
  Filled 2014-11-11: qty 1

## 2014-11-11 MED ORDER — ACETAMINOPHEN 325 MG PO TABS: 650.0000 mg | ORAL_TABLET | ORAL | Status: DC | PRN

## 2014-11-11 MED ORDER — MULTI-VITAMIN/MINERALS PO TABS: 1.0000 | ORAL_TABLET | Freq: Every day | ORAL | Status: DC

## 2014-11-11 MED ORDER — POTASSIUM CHLORIDE CRYS ER 10 MEQ PO TBCR
10.0000 meq | EXTENDED_RELEASE_TABLET | Freq: Every day | ORAL | Status: DC
Start: 2014-11-11 — End: 2023-10-09

## 2014-11-11 MED ORDER — LANTUS 100 UNIT/ML ~~LOC~~ SOLN: 1.0000 [IU] | Freq: Every day | SUBCUTANEOUS | Status: DC

## 2014-11-11 MED ORDER — ONDANSETRON HCL 4 MG PO TABS: 4.0000 mg | ORAL_TABLET | Freq: Three times a day (TID) | ORAL | Status: DC | PRN

## 2014-11-11 MED ORDER — NICOTINE 21 MG/24HR TD PT24: 21.0000 mg | MEDICATED_PATCH | Freq: Every day | TRANSDERMAL | Status: DC

## 2014-11-11 MED ORDER — METFORMIN HCL 1000 MG PO TABS: 1000.0000 mg | ORAL_TABLET | Freq: Two times a day (BID) | ORAL | Status: DC

## 2014-11-11 MED ORDER — FLUPHENAZINE HCL 10 MG PO TABS: 10.0000 mg | ORAL_TABLET | Freq: Every day | ORAL | Status: DC

## 2014-11-11 MED ORDER — FAMOTIDINE 20 MG PO TABS: 20.0000 mg | ORAL_TABLET | Freq: Two times a day (BID) | ORAL | Status: DC
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
## 2014-11-11 NOTE — ED Notes (Signed)
Pt presents with c/o suicidal thoughts. Per pt, she made some statements to the facility where she stays that she wanted to kill herself. Pt reports she does not have a plan, thoughts only. Pt reports she suffers from anxiety and depression as well.

## 2014-11-11 NOTE — ED Notes (Addendum)
Pt is asleep but does awaken when her name is called. She stated she wanted to wait on breakfast. Pt denies Si and HI and contracts for safety-pt is able to ambulate. Report was given to Angie and pt will be transported back to the pscy ER.
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
## 2014-11-11 NOTE — Consult Note (Signed)
Combine Psychiatry Consult   Reason for Consult:  Delusions, Suicidal Statements Referring Physician:  EDP Patient Identification: Carrie Clark MRN:  [id number] Principal Diagnosis: Schizophrenia, paranoid Diagnosis:   Patient Active Problem List   Diagnosis Date Noted  . Episode of behavior change [R68.89]     Priority: High  . 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 Psychoses [F29]     Total Time spent with patient: 30 minutes  Subjective:   Carrie Clark is a 54 y.o. female patient does not warrant admission. Pt seen and chart reviewed with this NP and Dr. Darleene Cleaver. Pt denies suicidal/homicidal ideation and psychosis and does not appear to be responding to internal stimuli at this time. Pt reports that she was upset, but is not upset any longer. Pt at baseline for discharge at this time.   HPI: The patient has stabilized.  Denies suicidal/homicidal ideations, hallucinations, and alcohol/drug abuse.  She is delusional at times over somatic concerns, but pt is as she was last time, in that she is not a threat to herself or others at this time. Pt is from [facility] and will sometimes act out there and require evaluation to rule out necessity of psychiatric admission.    HPI Elements:   Location:  generalized. Quality:  acute. Severity:  mild. Timing:  intermittent. Duration:  few days. Context:  stressors.  Past Medical History:  Past Medical History  Diagnosis Date  . Tardive dyskinesia   . COPD (chronic obstructive pulmonary disease)   . Depression   . Hypertension   . Hypothyroidism   . CVA (cerebral infarction)    History reviewed. No pertinent past surgical history. Family History: No family history on file. Social History:  History  Alcohol Use No     History  Drug Use No    History   Social History  . Marital Status: Unknown    Spouse Name: N/A  . Number of Children: N/A  . Years of Education: N/A   Social History Main  Topics  . Smoking status: Never Smoker   . Smokeless tobacco: Not on file  . Alcohol Use: No  . Drug Use: No  . Sexual Activity: Not on file   Other Topics Concern  . None   Social History Narrative   Additional Social History:                          Allergies:   Allergies  Allergen Reactions  . Fish Allergy Other (See Comments)    Allergy reaction not listed    Labs:  Results for orders placed or performed during the hospital encounter of 11/11/14 (from the past 48 hour(s))  Acetaminophen level     Status: Abnormal   Collection Time: 11/11/14  2:20 AM  Result Value Ref Range   Acetaminophen (Tylenol), Serum <10 (L) 10 - 30 ug/mL    Comment:        THERAPEUTIC CONCENTRATIONS VARY SIGNIFICANTLY. A RANGE OF 10-30 ug/mL MAY BE AN EFFECTIVE CONCENTRATION FOR MANY PATIENTS. HOWEVER, SOME ARE BEST TREATED AT CONCENTRATIONS OUTSIDE THIS RANGE. ACETAMINOPHEN CONCENTRATIONS >150 ug/mL AT 4 HOURS AFTER INGESTION AND >50 ug/mL AT 12 HOURS AFTER INGESTION ARE OFTEN ASSOCIATED WITH TOXIC REACTIONS.   CBC     Status: Abnormal   Collection Time: 11/11/14  2:20 AM  Result Value Ref Range   WBC 11.2 (H) 4.0 - 10.5 K/uL   RBC 4.10 3.87 - 5.11  MIL/uL   Hemoglobin 12.5 12.0 - 15.0 g/dL   HCT 38.4 36.0 - 46.0 %   MCV 93.7 78.0 - 100.0 fL   MCH 30.5 26.0 - 34.0 pg   MCHC 32.6 30.0 - 36.0 g/dL   RDW 12.9 11.5 - 15.5 %   Platelets 289 150 - 400 K/uL  Comprehensive metabolic panel     Status: Abnormal   Collection Time: 11/11/14  2:20 AM  Result Value Ref Range   Sodium 139 135 - 145 mmol/L   Potassium 3.6 3.5 - 5.1 mmol/L   Chloride 103 101 - 111 mmol/L   CO2 25 22 - 32 mmol/L   Glucose, Bld 169 (H) 65 - 99 mg/dL   BUN 9 6 - 20 mg/dL   Creatinine, Ser 0.72 0.44 - 1.00 mg/dL   Calcium 9.7 8.9 - 10.3 mg/dL   Total Protein 6.9 6.5 - 8.1 g/dL   Albumin 3.9 3.5 - 5.0 g/dL   AST 19 15 - 41 U/L   ALT 15 14 - 54 U/L   Alkaline Phosphatase 86 38 - 126 U/L   Total  Bilirubin 0.3 0.3 - 1.2 mg/dL   GFR calc non Af Amer >60 >60 mL/min   GFR calc Af Amer >60 >60 mL/min    Comment: (NOTE) The eGFR has been calculated using the CKD EPI equation. This calculation has not been validated in all clinical situations. eGFR's persistently <60 mL/min signify possible Chronic Kidney Disease.    Anion gap 11 5 - 15  Ethanol (ETOH)     Status: None   Collection Time: 11/11/14  2:20 AM  Result Value Ref Range   Alcohol, Ethyl (B) <5 <5 mg/dL    Comment:        LOWEST DETECTABLE LIMIT FOR SERUM ALCOHOL IS 5 mg/dL FOR MEDICAL PURPOSES ONLY   Salicylate level     Status: None   Collection Time: 11/11/14  2:20 AM  Result Value Ref Range   Salicylate Lvl <4.0 2.8 - 30.0 mg/dL    Vitals: Blood pressure 122/88, pulse 78, temperature 98.4 F (36.9 C), temperature source Oral, resp. rate 18, SpO2 100 %.  Risk to Self: Is patient at risk for suicide?: Yes Risk to Others:   Prior Inpatient Therapy:   Prior Outpatient Therapy:    Current Facility-Administered Medications  Medication Dose Route Frequency Provider Last Rate Last Dose  . acetaminophen (TYLENOL) tablet 650 mg  650 mg Oral Q4H PRN Antonietta Breach, PA-C      . nicotine (NICODERM CQ - dosed in mg/24 hours) patch 21 mg  21 mg Transdermal Daily Antonietta Breach, PA-C   21 mg at 11/11/14 0854  . ondansetron (ZOFRAN) tablet 4 mg  4 mg Oral Q8H PRN Antonietta Breach, PA-C       Current Outpatient Prescriptions  Medication Sig Dispense Refill  . albuterol (PROVENTIL HFA;VENTOLIN HFA) 108 (90 BASE) MCG/ACT inhaler Inhale 2 puffs into the lungs every 6 (six) hours as needed for wheezing or shortness of breath.    Marland Kitchen ibuprofen (ADVIL,MOTRIN) 400 MG tablet Take 400 mg by mouth 2 (two) times daily as needed for moderate pain.    Marland Kitchen LORazepam (ATIVAN) 0.5 MG tablet Take 0.5 mg by mouth 3 times/day as needed-between meals & bedtime for anxiety or sedation.     . ondansetron (ZOFRAN-ODT) 4 MG disintegrating tablet Take 4 mg by mouth  3 (three) times daily as needed for nausea.     . cetirizine (ZYRTEC) 10 MG tablet  Take 1 tablet (10 mg total) by mouth daily.    . famotidine (PEPCID) 20 MG tablet Take 1 tablet (20 mg total) by mouth 2 (two) times daily.    . fluPHENAZine (PROLIXIN) 10 MG tablet Take 1 tablet (10 mg total) by mouth at bedtime.    . fluticasone (FLONASE) 50 MCG/ACT nasal spray Place 50 sprays into both nostrils 2 (two) times daily as needed.    . furosemide (LASIX) 20 MG tablet Take 1 tablet (20 mg total) by mouth daily. 30 tablet   . LANTUS 100 UNIT/ML injection Inject 0.01 mLs (1 Units total) into the skin daily. And titrate dose as directed by prescriber 10 mL   . levothyroxine (SYNTHROID, LEVOTHROID) 50 MCG tablet Take 1 tablet (50 mcg total) by mouth daily.    Marland Kitchen losartan (COZAAR) 25 MG tablet Take 1 tablet (25 mg total) by mouth daily.    . metFORMIN (GLUCOPHAGE) 1000 MG tablet Take 1 tablet (1,000 mg total) by mouth 2 (two) times daily.    . Multiple Vitamins-Minerals (MULTIVITAMIN WITH MINERALS) tablet Take 1 tablet by mouth daily.    . nicotine (NICODERM CQ - DOSED IN MG/24 HOURS) 21 mg/24hr patch Place 1 patch (21 mg total) onto the skin daily. 28 patch 0  . potassium chloride (K-DUR,KLOR-CON) 10 MEQ tablet Take 1 tablet (10 mEq total) by mouth daily.      Musculoskeletal: Strength & Muscle Tone: within normal limits Gait & Station: normal Patient leans: N/A  Psychiatric Specialty Exam: Physical Exam  Review of Systems  Constitutional: Negative.   HENT: Negative.   Eyes: Negative.   Respiratory: Negative.   Cardiovascular: Negative.   Gastrointestinal: Negative.   Genitourinary: Negative.   Musculoskeletal: Negative.   Skin: Negative.   Neurological: Negative.   Endo/Heme/Allergies: Negative.   Psychiatric/Behavioral: Positive for depression. Negative for suicidal ideas. The patient has insomnia.   All other systems reviewed and are negative.   Blood pressure 122/88, pulse 78,  temperature 98.4 F (36.9 C), temperature source Oral, resp. rate 18, SpO2 100 %.There is no height or weight on file to calculate BMI.  General Appearance: Casual  Eye Contact::  Good  Speech:  Normal Rate  Volume:  Normal  Mood:  Euthymic  Affect:  Blunt  Thought Process:  Coherent  Orientation:  Full (Time, Place, and Person)  Thought Content:  Delusions  Suicidal Thoughts:  No  Homicidal Thoughts:  No  Memory:  Immediate;   Fair Recent;   Fair Remote;   Fair  Judgement:  Fair  Insight:  Fair  Psychomotor Activity:  Normal  Concentration:  Good  Recall:  Good  Fund of Knowledge:  Good  Language: Good  Akathisia:  No  Handed:  Right  AIMS (if indicated):     Assets:  Financial Resources/Insurance Housing Leisure Time Resilience Social Support  ADL's:  Intact  Cognition: WNL  Sleep:      Medical Decision Making: Review of Psycho-Social Stressors (1), Review or order clinical lab tests (1) and Review of Medication Regimen & Side Effects (2)  Treatment Plan Summary: Schizophrenia, paranoid, improving acute episode of exacerbation; stable for discharge back to group home.   Daily contact with patient to assess and evaluate symptoms and progress in treatment, Medication management and Plan DIscharge to [facility], SNF with follow-up at her regular providers  Plan:  No evidence of imminent risk to self or others at present.     Disposition: -Discharge to [facility], assisted living -Continue  home meds without changes -Follow-up at her regular providers  Benjamine Mola, FNP-BC 11/11/2014 3:18 PM  Patient seen face-to-face for psychiatric evaluation, chart reviewed and case discussed with the physician extender and developed treatment plan. Reviewed the information documented and agree with the treatment plan. Corena Pilgrim, MD

## 2014-11-11 NOTE — ED Notes (Addendum)
Pt wanded, belongings searched by security. 

## 2014-11-11 NOTE — Progress Notes (Signed)
CSW was informed that by 1st shift ED CSW that Lurena Joiner from [facility] states that the patient does not need an FL2 due to her not being here more than 24 hours.  CSW spoke with Deanna Artis of [facility] who states that the pt is welcomed back, and that Dorthy from the facility will be here to pick up the pt upon discharge.  Trish Mage [phone] ED CSW 11/11/2014 4:23 PM

## 2014-11-11 NOTE — ED Notes (Signed)
Patient verbalized understanding of discharge instructions and follow up, no further questions at this time. Patient will be discharged back to [facility] once ride arrives.  Watson Robarge, Wyman Songster, RN

## 2014-11-11 NOTE — ED Notes (Signed)
Patient denies SI/HI and A/V hallucinations. Patient being transferred back to [facility], stated she is very happy to be going back.  Mahalie Kanner, Wyman Songster, RN

## 2014-11-11 NOTE — ED Notes (Signed)
Pt in room and not talking at this time. Pt became tearful,when asked what was wrong, pt states "I rather not say". Pt not answering any other questions at this time.

## 2014-11-11 NOTE — ED Notes (Signed)
Patient denies SI/HI and A/V hallucinations. Patient gives minimal response, is isolative to her room, flat affect and presents as depressed. Patient kept closing her eyes and gave doctor minimal response while he was trying to talk to her.  Sylvestre Rathgeber, Wyman Songster, RN

## 2014-11-11 NOTE — BH Assessment (Addendum)
Spoke with Carollee Herter RN, who reports pt is keeping her head covered and not speaking. She will contact this writer if pt appears ready to be assessed.    Attempted again to assess pt, she would not remove blanket from her head, would not respond to questions. She made growl/snoring sounds when questions were asked. Carollee Herter RN reports pt informed her she was from [facility]. When asked if she was thinking of harming herself pt only responded with "I'd rather no say," and then did not communicate with staff again. Carollee Herter RN will contact this Clinical research associate by [phone] if pt awakens more fully at morning vitals.     Clista Bernhardt, [facility] Triage Specialist 11/11/2014 4:28 AM

## 2014-11-17 ENCOUNTER — Emergency Department (HOSPITAL_COMMUNITY)
Admission: EM | Admit: 2014-11-17 | Discharge: 2014-11-17 | Disposition: A | Discharge status: Home / Self Care | Payer: Medicare Other | Attending: Emergency Medicine | Admitting: Emergency Medicine

## 2014-11-17 ENCOUNTER — Encounter (HOSPITAL_COMMUNITY): Payer: Self-pay | Admitting: *Deleted

## 2014-11-17 DIAGNOSIS — J449 Chronic obstructive pulmonary disease, unspecified: Secondary | ICD-10-CM | POA: Diagnosis not present

## 2014-11-17 DIAGNOSIS — I10 Essential (primary) hypertension: Secondary | ICD-10-CM | POA: Insufficient documentation

## 2014-11-17 DIAGNOSIS — R112 Nausea with vomiting, unspecified: Secondary | ICD-10-CM

## 2014-11-17 DIAGNOSIS — Z794 Long term (current) use of insulin: Secondary | ICD-10-CM | POA: Diagnosis not present

## 2014-11-17 DIAGNOSIS — E039 Hypothyroidism, unspecified: Secondary | ICD-10-CM | POA: Insufficient documentation

## 2014-11-17 DIAGNOSIS — Z79899 Other long term (current) drug therapy: Secondary | ICD-10-CM | POA: Insufficient documentation

## 2014-11-17 DIAGNOSIS — Z8669 Personal history of other diseases of the nervous system and sense organs: Secondary | ICD-10-CM | POA: Diagnosis not present

## 2014-11-17 DIAGNOSIS — Z8673 Personal history of transient ischemic attack (TIA), and cerebral infarction without residual deficits: Secondary | ICD-10-CM | POA: Diagnosis not present

## 2014-11-17 LAB — CBC WITH DIFFERENTIAL/PLATELET
BASOS PCT: 0 % (ref 0–1)
Basophils Absolute: 0 10*3/uL (ref 0.0–0.1)
EOS ABS: 0.3 10*3/uL (ref 0.0–0.7)
Eosinophils Relative: 3 % (ref 0–5)
HCT: 37.2 % (ref 36.0–46.0)
Hemoglobin: 12.3 g/dL (ref 12.0–15.0)
Lymphocytes Relative: 43 % (ref 12–46)
Lymphs Abs: 5 10*3/uL — ABNORMAL HIGH (ref 0.7–4.0)
MCH: 31 pg (ref 26.0–34.0)
MCHC: 33.1 g/dL (ref 30.0–36.0)
MCV: 93.7 fL (ref 78.0–100.0)
Monocytes Absolute: 0.8 10*3/uL (ref 0.1–1.0)
Monocytes Relative: 7 % (ref 3–12)
Neutro Abs: 5.6 10*3/uL (ref 1.7–7.7)
Neutrophils Relative %: 47 % (ref 43–77)
PLATELETS: 285 10*3/uL (ref 150–400)
RBC: 3.97 MIL/uL (ref 3.87–5.11)
RDW: 13 % (ref 11.5–15.5)
WBC: 11.7 10*3/uL — ABNORMAL HIGH (ref 4.0–10.5)

## 2014-11-17 LAB — COMPREHENSIVE METABOLIC PANEL
ALBUMIN: 3.6 g/dL (ref 3.5–5.0)
ALK PHOS: 78 U/L (ref 38–126)
ALT: 12 U/L — AB (ref 14–54)
AST: 20 U/L (ref 15–41)
Anion gap: 11 (ref 5–15)
BUN: 10 mg/dL (ref 6–20)
CO2: 27 mmol/L (ref 22–32)
CREATININE: 0.7 mg/dL (ref 0.44–1.00)
Calcium: 9.4 mg/dL (ref 8.9–10.3)
Chloride: 99 mmol/L — ABNORMAL LOW (ref 101–111)
GFR calc Af Amer: >60 mL/min (ref >60)
Glucose, Bld: 113 mg/dL — ABNORMAL HIGH (ref 65–99)
POTASSIUM: 3.3 mmol/L — AB (ref 3.5–5.1)
SODIUM: 137 mmol/L (ref 135–145)
Total Bilirubin: 0.5 mg/dL (ref 0.3–1.2)
Total Protein: 6.5 g/dL (ref 6.5–8.1)

## 2014-11-17 LAB — URINE MICROSCOPIC-ADD ON

## 2014-11-17 LAB — URINALYSIS, ROUTINE W REFLEX MICROSCOPIC
BILIRUBIN URINE: NEGATIVE
Glucose, UA: NEGATIVE mg/dL
HGB URINE DIPSTICK: NEGATIVE
Ketones, ur: NEGATIVE mg/dL
Nitrite: NEGATIVE
Protein, ur: NEGATIVE mg/dL
Specific Gravity, Urine: 1.002 — ABNORMAL LOW (ref 1.005–1.030)
Urobilinogen, UA: 0.2 mg/dL (ref 0.0–1.0)
pH: 6 (ref 5.0–8.0)

## 2014-11-17 LAB — CBG MONITORING, ED: Glucose-Capillary: 105 mg/dL — ABNORMAL HIGH (ref 65–99)

## 2014-11-17 LAB — LIPASE, BLOOD: Lipase: 22 U/L (ref 22–51)

## 2014-11-17 MED ORDER — SODIUM CHLORIDE 0.9 % IV BOLUS (SEPSIS)
1000.0000 mL | Freq: Once | INTRAVENOUS | Status: AC
Start: 2014-11-17 — End: 2014-11-17
  Administered 2014-11-17: 1000 mL via INTRAVENOUS

## 2014-11-17 MED ORDER — ONDANSETRON HCL 4 MG/2ML IJ SOLN
4.0000 mg | Freq: Once | INTRAMUSCULAR | Status: AC
  Administered 2014-11-17: 4 mg via INTRAVENOUS
  Filled 2014-11-17: qty 2

## 2014-11-17 NOTE — ED Notes (Signed)
Bed: FE07 Expected date: 11/17/14 Expected time: 12:26 AM Means of arrival: Ambulance Comments: Vomiting, hypotensive

## 2014-11-17 NOTE — ED Notes (Signed)
Pt states " I just don't feel good", pt unable to tell what is wrong, asking for blood to be checked, pt also asking for snack.

## 2014-11-17 NOTE — ED Notes (Signed)
Pt stating she is not nauseous. Requesting a snack.

## 2014-11-17 NOTE — ED Notes (Signed)
Per EMS pt from [facility], states she's been having vomiting, low blood pressure, low blood sugar, and blood in urine x 2 weeks, BP 110/70, CBG 124.

## 2014-11-17 NOTE — ED Notes (Signed)
[facility] called to transport pt back to [facility] 

## 2014-11-17 NOTE — ED Provider Notes (Signed)
CSN: [id number]     Arrival date & time 11/17/14  0038 History   First MD Initiated Contact with Patient 11/17/14 0046     Chief Complaint  Patient presents with  . Emesis     (Consider location/radiation/quality/duration/timing/severity/associated sxs/prior Treatment) Patient is a 54 y.o. female presenting with vomiting. The history is provided by the patient. No language interpreter was used.  Emesis Severity:  Moderate Duration:  3 days Timing:  Constant Number of daily episodes:  3 Quality:  Stomach contents Progression:  Unchanged Chronicity:  New Recent urination:  Normal Context: not post-tussive and not self-induced   Relieved by:  Nothing Worsened by:  Nothing tried Ineffective treatments:  None tried Associated symptoms: no abdominal pain, no arthralgias, no chills and no diarrhea   Risk factors: no alcohol use, no diabetes, not pregnant now, no sick contacts, no suspect food intake and no travel to endemic areas     Past Medical History  Diagnosis Date  . Tardive dyskinesia   . COPD (chronic obstructive pulmonary disease)   . Depression   . Hypertension   . Hypothyroidism   . CVA (cerebral infarction)    History reviewed. No pertinent past surgical history. No family history on file. History  Substance Use Topics  . Smoking status: Never Smoker   . Smokeless tobacco: Not on file  . Alcohol Use: No   OB History    No data available     Review of Systems  Constitutional: Negative for fever, chills and fatigue.  HENT: Negative for trouble swallowing.   Eyes: Negative for visual disturbance.  Respiratory: Negative for shortness of breath.   Cardiovascular: Negative for chest pain and palpitations.  Gastrointestinal: Positive for nausea and vomiting. Negative for abdominal pain and diarrhea.  Genitourinary: Negative for dysuria and difficulty urinating.  Musculoskeletal: Negative for arthralgias and neck pain.  Skin: Negative for color change.   Neurological: Negative for dizziness and weakness.  Psychiatric/Behavioral: Negative for dysphoric mood.      Allergies  Fish allergy  Home Medications   Prior to Admission medications   Medication Sig Start Date End Date Taking? Authorizing Provider  albuterol (PROVENTIL HFA;VENTOLIN HFA) 108 (90 BASE) MCG/ACT inhaler Inhale 2 puffs into the lungs every 6 (six) hours as needed for wheezing or shortness of breath.    Historical Provider, MD  cetirizine (ZYRTEC) 10 MG tablet Take 1 tablet (10 mg total) by mouth daily. 11/11/14   Beau Fanny, FNP  famotidine (PEPCID) 20 MG tablet Take 1 tablet (20 mg total) by mouth 2 (two) times daily. 11/11/14   Beau Fanny, FNP  fluPHENAZine (PROLIXIN) 10 MG tablet Take 1 tablet (10 mg total) by mouth at bedtime. 11/11/14   Beau Fanny, FNP  fluticasone (FLONASE) 50 MCG/ACT nasal spray Place 50 sprays into both nostrils 2 (two) times daily as needed. 11/11/14   Beau Fanny, FNP  furosemide (LASIX) 20 MG tablet Take 1 tablet (20 mg total) by mouth daily. 11/11/14   Beau Fanny, FNP  ibuprofen (ADVIL,MOTRIN) 400 MG tablet Take 400 mg by mouth 2 (two) times daily as needed for moderate pain.    Historical Provider, MD  LANTUS 100 UNIT/ML injection Inject 0.01 mLs (1 Units total) into the skin daily. And titrate dose as directed by prescriber 11/11/14   Beau Fanny, FNP  levothyroxine (SYNTHROID, LEVOTHROID) 50 MCG tablet Take 1 tablet (50 mcg total) by mouth daily. 11/11/14   Beau Fanny, FNP  LORazepam (  ATIVAN) 0.5 MG tablet Take 0.5 mg by mouth 3 times/day as needed-between meals & bedtime for anxiety or sedation.  09/09/14   Historical Provider, MD  losartan (COZAAR) 25 MG tablet Take 1 tablet (25 mg total) by mouth daily. 11/11/14   Beau Fanny, FNP  metFORMIN (GLUCOPHAGE) 1000 MG tablet Take 1 tablet (1,000 mg total) by mouth 2 (two) times daily. 11/11/14   Beau Fanny, FNP  Multiple Vitamins-Minerals (MULTIVITAMIN WITH MINERALS) tablet  Take 1 tablet by mouth daily. 11/11/14   Beau Fanny, FNP  nicotine (NICODERM CQ - DOSED IN MG/24 HOURS) 21 mg/24hr patch Place 1 patch (21 mg total) onto the skin daily. 11/11/14   Beau Fanny, FNP  ondansetron (ZOFRAN-ODT) 4 MG disintegrating tablet Take 4 mg by mouth 3 (three) times daily as needed for nausea.  09/09/14   Historical Provider, MD  potassium chloride (K-DUR,KLOR-CON) 10 MEQ tablet Take 1 tablet (10 mEq total) by mouth daily. 11/11/14   Beau Fanny, FNP   There were no vitals taken for this visit. Physical Exam  Constitutional: She is oriented to person, place, and time. She appears well-developed and well-nourished. No distress.  HENT:  Head: Normocephalic and atraumatic.  Eyes: Conjunctivae and EOM are normal.  Neck: Normal range of motion.  Cardiovascular: Normal rate and regular rhythm.  Exam reveals no gallop and no friction rub.   No murmur heard. Pulmonary/Chest: Effort normal and breath sounds normal. She has no wheezes. She has no rales. She exhibits no tenderness.  Abdominal: Soft. She exhibits no distension. There is no tenderness. There is no rebound.  Musculoskeletal: Normal range of motion.  Neurological: She is alert and oriented to person, place, and time. Coordination normal.  Speech is goal-oriented. Moves limbs without ataxia.   Skin: Skin is warm and dry.  Psychiatric: She has a normal mood and affect. Her behavior is normal.  Nursing note and vitals reviewed.   ED Course  Procedures (including critical care time) Labs Review Labs Reviewed  CBC WITH DIFFERENTIAL/PLATELET - Abnormal; Notable for the following:    WBC 11.7 (*)    Lymphs Abs 5.0 (*)    All other components within normal limits  COMPREHENSIVE METABOLIC PANEL - Abnormal; Notable for the following:    Potassium 3.3 (*)    Chloride 99 (*)    Glucose, Bld 113 (*)    ALT 12 (*)    All other components within normal limits  URINALYSIS, ROUTINE W REFLEX MICROSCOPIC (NOT AT [facility]) -  Abnormal; Notable for the following:    APPearance CLOUDY (*)    Specific Gravity, Urine 1.002 (*)    Leukocytes, UA TRACE (*)    All other components within normal limits  CBG MONITORING, ED - Abnormal; Notable for the following:    Glucose-Capillary 105 (*)    All other components within normal limits  LIPASE, BLOOD  URINE MICROSCOPIC-ADD ON    Imaging Review No results found.   EKG Interpretation None      MDM   Final diagnoses:  Non-intractable vomiting with nausea, vomiting of unspecified type    2:46 AM Labs show mild elevation in WBC which are likely due to vomiting. I reviewed the labs and urinalysis which show no other acute changes. Vitals stable and patient afebrile. Patient reports her nausea has improved. Patient likely has a viral illness and will be discharged without further evaluation.    Emilia Beck, PA-C 11/17/14 0253  Richardean Canal, MD 11/17/14  0704 

## 2014-11-17 NOTE — ED Notes (Signed)
Pt ambulatory to restroom with steady gait to give urine sample.

## 2014-12-09 ENCOUNTER — Encounter (HOSPITAL_COMMUNITY): Payer: Self-pay | Admitting: Physical Medicine and Rehabilitation

## 2014-12-09 ENCOUNTER — Emergency Department (HOSPITAL_COMMUNITY)
Admission: EM | Admit: 2014-12-09 | Discharge: 2014-12-10 | Disposition: A | Discharge status: Skilled Nursing Facility | Payer: Medicare Other | Attending: Emergency Medicine | Admitting: Emergency Medicine

## 2014-12-09 ENCOUNTER — Emergency Department (HOSPITAL_COMMUNITY): Payer: Medicare Other

## 2014-12-09 DIAGNOSIS — Z8669 Personal history of other diseases of the nervous system and sense organs: Secondary | ICD-10-CM | POA: Insufficient documentation

## 2014-12-09 DIAGNOSIS — J449 Chronic obstructive pulmonary disease, unspecified: Secondary | ICD-10-CM | POA: Insufficient documentation

## 2014-12-09 DIAGNOSIS — I10 Essential (primary) hypertension: Secondary | ICD-10-CM | POA: Diagnosis not present

## 2014-12-09 DIAGNOSIS — Z79899 Other long term (current) drug therapy: Secondary | ICD-10-CM | POA: Diagnosis not present

## 2014-12-09 DIAGNOSIS — E039 Hypothyroidism, unspecified: Secondary | ICD-10-CM | POA: Diagnosis not present

## 2014-12-09 DIAGNOSIS — F209 Schizophrenia, unspecified: Secondary | ICD-10-CM | POA: Diagnosis not present

## 2014-12-09 DIAGNOSIS — Z8673 Personal history of transient ischemic attack (TIA), and cerebral infarction without residual deficits: Secondary | ICD-10-CM | POA: Diagnosis not present

## 2014-12-09 DIAGNOSIS — Z794 Long term (current) use of insulin: Secondary | ICD-10-CM | POA: Diagnosis not present

## 2014-12-09 DIAGNOSIS — R531 Weakness: Secondary | ICD-10-CM | POA: Diagnosis present

## 2014-12-09 HISTORY — DX: Schizophrenia, unspecified: F20.9

## 2014-12-09 LAB — CBC WITH DIFFERENTIAL/PLATELET
BASOS ABS: 0.1 10*3/uL (ref 0.0–0.1)
Basophils Relative: 1 % (ref 0–1)
Eosinophils Absolute: 0.3 10*3/uL (ref 0.0–0.7)
Eosinophils Relative: 3 % (ref 0–5)
HEMATOCRIT: 35.6 % — AB (ref 36.0–46.0)
HEMOGLOBIN: 12 g/dL (ref 12.0–15.0)
LYMPHS PCT: 33 % (ref 12–46)
Lymphs Abs: 3.6 10*3/uL (ref 0.7–4.0)
MCH: 31 pg (ref 26.0–34.0)
MCHC: 33.7 g/dL (ref 30.0–36.0)
MCV: 92 fL (ref 78.0–100.0)
MONO ABS: 0.7 10*3/uL (ref 0.1–1.0)
MONOS PCT: 7 % (ref 3–12)
Neutro Abs: 6.2 10*3/uL (ref 1.7–7.7)
Neutrophils Relative %: 56 % (ref 43–77)
Platelets: 291 10*3/uL (ref 150–400)
RBC: 3.87 MIL/uL (ref 3.87–5.11)
RDW: 12.7 % (ref 11.5–15.5)
WBC: 10.9 10*3/uL — ABNORMAL HIGH (ref 4.0–10.5)

## 2014-12-09 LAB — URINALYSIS, ROUTINE W REFLEX MICROSCOPIC
Bilirubin Urine: NEGATIVE
GLUCOSE, UA: NEGATIVE mg/dL
HGB URINE DIPSTICK: NEGATIVE
Ketones, ur: NEGATIVE mg/dL
Nitrite: NEGATIVE
Protein, ur: NEGATIVE mg/dL
Specific Gravity, Urine: 1.002 — ABNORMAL LOW (ref 1.005–1.030)
UROBILINOGEN UA: 0.2 mg/dL (ref 0.0–1.0)
pH: 5.5 (ref 5.0–8.0)

## 2014-12-09 LAB — URINE MICROSCOPIC-ADD ON

## 2014-12-09 LAB — CBG MONITORING, ED: GLUCOSE-CAPILLARY: 157 mg/dL — AB (ref 65–99)

## 2014-12-09 LAB — COMPREHENSIVE METABOLIC PANEL
ALT: 14 U/L (ref 14–54)
ANION GAP: 11 (ref 5–15)
AST: 21 U/L (ref 15–41)
Albumin: 3.6 g/dL (ref 3.5–5.0)
Alkaline Phosphatase: 69 U/L (ref 38–126)
BUN: 10 mg/dL (ref 6–20)
CO2: 23 mmol/L (ref 22–32)
Calcium: 9.3 mg/dL (ref 8.9–10.3)
Chloride: 99 mmol/L — ABNORMAL LOW (ref 101–111)
Creatinine, Ser: 0.71 mg/dL (ref 0.44–1.00)
GFR calc Af Amer: >60 mL/min (ref >60)
GFR calc non Af Amer: >60 mL/min (ref >60)
Glucose, Bld: 212 mg/dL — ABNORMAL HIGH (ref 65–99)
POTASSIUM: 4.3 mmol/L (ref 3.5–5.1)
Sodium: 133 mmol/L — ABNORMAL LOW (ref 135–145)
Total Bilirubin: 0.3 mg/dL (ref 0.3–1.2)
Total Protein: 5.9 g/dL — ABNORMAL LOW (ref 6.5–8.1)

## 2014-12-09 LAB — ETHANOL: Alcohol, Ethyl (B): <5 mg/dL (ref <5)

## 2014-12-09 MED ORDER — SODIUM CHLORIDE 0.9 % IV BOLUS (SEPSIS)
500.0000 mL | Freq: Once | INTRAVENOUS | Status: AC
  Administered 2014-12-09: 500 mL via INTRAVENOUS

## 2014-12-09 NOTE — ED Notes (Signed)
Tried to call report to [facility], phone line busy x2.

## 2014-12-09 NOTE — Discharge Instructions (Signed)

## 2014-12-09 NOTE — ED Notes (Signed)
[facility] has been contacted for pt. Pick up.

## 2014-12-09 NOTE — ED Notes (Signed)
Pt was found by security walking around hospital campus and brought back to ED lobby. Pt was diaphoretic and drooling. Vitals and CBG were rechecked. When asked why she left, pt responded, "I don't know." Pt has been asking for matches, ice cream, and food. Pt only given water at this time, per RN Danae OrleansNikki Simmons.

## 2014-12-09 NOTE — ED Notes (Addendum)
Pt gave tech a urine cup filled with water, pt informed that she needed to provide a urine specimen and verbalized understanding.

## 2014-12-09 NOTE — ED Notes (Signed)
Pt states she is ready to go, refusing to put BP cuff and be placed on heart monitor again.

## 2014-12-09 NOTE — ED Notes (Signed)
Pt presents to department for evaluation of generalized weakness. [facility] ED via [facility]. Pt states "I need a blood transfusion because I feel weak." pt is alert and oriented x4.

## 2014-12-09 NOTE — ED Provider Notes (Signed)
CSN: [id number]     Arrival date & time 12/09/14  1449 History   First MD Initiated Contact with Patient 12/09/14 1657     Chief Complaint  Patient presents with  . Weakness     (Consider location/radiation/quality/duration/timing/severity/associated sxs/prior Treatment) HPI Comments: Patient with a history of paranoid schizophrenia, tardive dyskinesia, diabetes mellitus and COPD presents with generalized weakness. She lives at [facility] assisted living facility. She was brought in by [facility] for generalized weakness. After she initially checked in, she apparently wandered off and was found by security walking outside the building. She was diaphoretic. She was brought back to the ED. When asked her why she left, she states that she just needs a little exercise. She states she feels a little weak after the walk. She feels generally weak. She feels a little bit achy all over. She denies any specific areas of pain. She denies any chest pain or shortness of breath. She has a little bit of nonproductive cough. She denies any urinary symptoms. She denies any nausea vomiting or diarrhea. She denies any hallucinations. She denies any suicidal ideations.  Patient is a 54 y.o. female presenting with weakness.  Weakness Pertinent negatives include no chest pain, no abdominal pain, no headaches and no shortness of breath.    Past Medical History  Diagnosis Date  . Tardive dyskinesia   . COPD (chronic obstructive pulmonary disease)   . Depression   . Hypertension   . Hypothyroidism   . CVA (cerebral infarction)   . Schizophrenia    History reviewed. No pertinent past surgical history. No family history on file. History  Substance Use Topics  . Smoking status: Never Smoker   . Smokeless tobacco: Not on file  . Alcohol Use: No   OB History    No data available     Review of Systems  Constitutional: Negative for fever, chills, diaphoresis and fatigue.  HENT: Negative for  congestion, rhinorrhea and sneezing.   Eyes: Negative.   Respiratory: Positive for cough. Negative for chest tightness and shortness of breath.   Cardiovascular: Negative for chest pain and leg swelling.  Gastrointestinal: Negative for nausea, vomiting, abdominal pain, diarrhea and blood in stool.  Genitourinary: Negative for frequency, hematuria, flank pain and difficulty urinating.  Musculoskeletal: Positive for myalgias. Negative for back pain and arthralgias.  Skin: Negative for rash.  Neurological: Positive for weakness. Negative for dizziness, speech difficulty, numbness and headaches.      Allergies  Fish allergy  Home Medications   Prior to Admission medications   Medication Sig Start Date End Date Taking? Authorizing Provider  albuterol (PROVENTIL HFA;VENTOLIN HFA) 108 (90 BASE) MCG/ACT inhaler Inhale 2 puffs into the lungs every 6 (six) hours as needed for wheezing or shortness of breath.   Yes Historical Provider, MD  cetirizine (ZYRTEC) 10 MG tablet Take 1 tablet (10 mg total) by mouth daily. 11/11/14  Yes Beau Fanny, FNP  famotidine (PEPCID) 20 MG tablet Take 1 tablet (20 mg total) by mouth 2 (two) times daily. 11/11/14  Yes Beau Fanny, FNP  fluPHENAZine (PROLIXIN) 5 MG tablet Take 5 mg by mouth daily.   Yes Historical Provider, MD  fluticasone (FLONASE) 50 MCG/ACT nasal spray Place 50 sprays into both nostrils 2 (two) times daily as needed. Patient taking differently: Place 50 sprays into both nostrils 2 (two) times daily as needed for allergies or rhinitis.  11/11/14  Yes Beau Fanny, FNP  furosemide (LASIX) 20 MG tablet Take  1 tablet (20 mg total) by mouth daily. 11/11/14  Yes Beau Fanny, FNP  ibuprofen (ADVIL,MOTRIN) 400 MG tablet Take 400 mg by mouth 2 (two) times daily as needed for moderate pain.   Yes Historical Provider, MD  levothyroxine (SYNTHROID, LEVOTHROID) 50 MCG tablet Take 1 tablet (50 mcg total) by mouth daily. 11/11/14  Yes Beau Fanny, FNP   lisinopril (PRINIVIL,ZESTRIL) 5 MG tablet Take 5 mg by mouth daily.   Yes Historical Provider, MD  LORazepam (ATIVAN) 0.5 MG tablet Take 0.5 mg by mouth 3 times/day as needed-between meals & bedtime for anxiety or sedation.  09/09/14  Yes Historical Provider, MD  metFORMIN (GLUCOPHAGE) 1000 MG tablet Take 1 tablet (1,000 mg total) by mouth 2 (two) times daily. 11/11/14  Yes Beau Fanny, FNP  Multiple Vitamins-Minerals (MULTIVITAMIN WITH MINERALS) tablet Take 1 tablet by mouth daily. 11/11/14  Yes Beau Fanny, FNP  nicotine (NICODERM CQ - DOSED IN MG/24 HOURS) 21 mg/24hr patch Place 1 patch (21 mg total) onto the skin daily. 11/11/14  Yes Beau Fanny, FNP  ondansetron (ZOFRAN-ODT) 4 MG disintegrating tablet Take 4 mg by mouth 3 (three) times daily as needed for nausea.  09/09/14  Yes Historical Provider, MD  potassium chloride (K-DUR,KLOR-CON) 10 MEQ tablet Take 1 tablet (10 mEq total) by mouth daily. 11/11/14  Yes Beau Fanny, FNP  tiotropium (SPIRIVA) 18 MCG inhalation capsule Place 18 mcg into inhaler and inhale daily.   Yes Historical Provider, MD  fluPHENAZine (PROLIXIN) 10 MG tablet Take 1 tablet (10 mg total) by mouth at bedtime. Patient not taking: Reported on 12/09/2014 11/11/14   Beau Fanny, FNP  LANTUS 100 UNIT/ML injection Inject 0.01 mLs (1 Units total) into the skin daily. And titrate dose as directed by prescriber Patient not taking: Reported on 12/09/2014 11/11/14   Beau Fanny, FNP  losartan (COZAAR) 25 MG tablet Take 1 tablet (25 mg total) by mouth daily. Patient not taking: Reported on 12/09/2014 11/11/14   Everardo All Withrow, FNP   BP 140/92 mmHg  Pulse 78  Temp(Src) 98 F (36.7 C) (Oral)  Resp 18  SpO2 98% Physical Exam  Constitutional: She is oriented to person, place, and time. She appears well-developed and well-nourished.  HENT:  Head: Normocephalic and atraumatic.  Eyes: Pupils are equal, round, and reactive to light.  Neck: Normal range of motion. Neck supple.   Cardiovascular: Normal rate, regular rhythm and normal heart sounds.   Pulmonary/Chest: Effort normal and breath sounds normal. No respiratory distress. She has no wheezes. She has no rales. She exhibits no tenderness.  Abdominal: Soft. Bowel sounds are normal. There is no tenderness. There is no rebound and no guarding.  Musculoskeletal: Normal range of motion. She exhibits no edema.  No edema or calf tenderness  Lymphadenopathy:    She has no cervical adenopathy.  Neurological: She is alert and oriented to person, place, and time.  Patient is oriented to person place and time. She knows the day of the week and the month and year. She is acting appropriately with no apparent psychosis. She is moving all extremities symmetrically. She has no facial drooping or slurred speech.  Skin: Skin is warm and dry. No rash noted.  Psychiatric: She has a normal mood and affect.    ED Course  Procedures (including critical care time) Labs Review Labs Reviewed  CBC WITH DIFFERENTIAL/PLATELET - Abnormal; Notable for the following:    WBC 10.9 (*)    HCT  35.6 (*)    All other components within normal limits  COMPREHENSIVE METABOLIC PANEL - Abnormal; Notable for the following:    Sodium 133 (*)    Chloride 99 (*)    Glucose, Bld 212 (*)    Total Protein 5.9 (*)    All other components within normal limits  URINALYSIS, ROUTINE W REFLEX MICROSCOPIC (NOT AT [facility]) - Abnormal; Notable for the following:    Specific Gravity, Urine 1.002 (*)    Leukocytes, UA TRACE (*)    All other components within normal limits  URINE MICROSCOPIC-ADD ON - Abnormal; Notable for the following:    Squamous Epithelial / LPF FEW (*)    All other components within normal limits  CBG MONITORING, ED - Abnormal; Notable for the following:    Glucose-Capillary 157 (*)    All other components within normal limits  ETHANOL  CBG MONITORING, ED    Imaging Review Dg Chest 2 View  12/09/2014   CLINICAL DATA:  Cough, weakness,  history of COPD.  Nonsmoker.  EXAM: CHEST  2 VIEW  COMPARISON:  07/27/2014  FINDINGS: The heart size and mediastinal contours are within normal limits. Both lungs are clear. The visualized skeletal structures are unremarkable.  IMPRESSION: No active cardiopulmonary disease.   Electronically Signed   By: Elige KoHetal  Patel   On: 12/09/2014 18:46     EKG Interpretation   Date/Time:  Friday December 09 2014 17:16:30 EDT Ventricular Rate:  88 PR Interval:    QRS Duration: 90 QT Interval:  379 QTC Calculation: 458 R Axis:   19 Text Interpretation:  Normal sinus rhythm No old tracing to compare  Confirmed by Tahjay Binion  MD, Omeed Osuna ([id number]) on 12/09/2014 5:22:59 PM      MDM   Final diagnoses:  Weakness    Pt's labs are unremarkable.  Urine neg.  Pt without symptoms currently.  Ambulating without difficulty.  mentating normally without evidence of psychosis.  Will d/c back to assisted living    Rolan BuccoMelanie Anica Alcaraz, MD 12/10/14 1058

## 2014-12-09 NOTE — ED Notes (Addendum)
Tried to call report to [facility], phone line busy x1.

## 2014-12-10 NOTE — ED Notes (Signed)
Pt discharged back to [facility] with [facility]

## 2015-02-05 ENCOUNTER — Emergency Department (HOSPITAL_COMMUNITY): Payer: Medicare Other

## 2015-02-05 ENCOUNTER — Encounter (HOSPITAL_COMMUNITY): Payer: Self-pay

## 2015-02-05 ENCOUNTER — Emergency Department (HOSPITAL_COMMUNITY)
Admission: EM | Admit: 2015-02-05 | Discharge: 2015-02-05 | Disposition: A | Discharge status: Home / Self Care | Payer: Medicare Other | Attending: Emergency Medicine | Admitting: Emergency Medicine

## 2015-02-05 DIAGNOSIS — R51 Headache: Secondary | ICD-10-CM | POA: Diagnosis present

## 2015-02-05 DIAGNOSIS — F200 Paranoid schizophrenia: Secondary | ICD-10-CM | POA: Insufficient documentation

## 2015-02-05 DIAGNOSIS — Z720 Tobacco use: Secondary | ICD-10-CM | POA: Insufficient documentation

## 2015-02-05 DIAGNOSIS — Z8669 Personal history of other diseases of the nervous system and sense organs: Secondary | ICD-10-CM | POA: Insufficient documentation

## 2015-02-05 DIAGNOSIS — E039 Hypothyroidism, unspecified: Secondary | ICD-10-CM | POA: Diagnosis not present

## 2015-02-05 DIAGNOSIS — J449 Chronic obstructive pulmonary disease, unspecified: Secondary | ICD-10-CM | POA: Diagnosis not present

## 2015-02-05 DIAGNOSIS — F419 Anxiety disorder, unspecified: Secondary | ICD-10-CM | POA: Insufficient documentation

## 2015-02-05 DIAGNOSIS — R000 Tachycardia, unspecified: Secondary | ICD-10-CM | POA: Diagnosis not present

## 2015-02-05 DIAGNOSIS — I10 Essential (primary) hypertension: Secondary | ICD-10-CM | POA: Diagnosis not present

## 2015-02-05 DIAGNOSIS — F329 Major depressive disorder, single episode, unspecified: Secondary | ICD-10-CM | POA: Diagnosis not present

## 2015-02-05 DIAGNOSIS — Z7951 Long term (current) use of inhaled steroids: Secondary | ICD-10-CM | POA: Insufficient documentation

## 2015-02-05 DIAGNOSIS — Z79899 Other long term (current) drug therapy: Secondary | ICD-10-CM | POA: Insufficient documentation

## 2015-02-05 DIAGNOSIS — Z794 Long term (current) use of insulin: Secondary | ICD-10-CM | POA: Insufficient documentation

## 2015-02-05 DIAGNOSIS — Z8673 Personal history of transient ischemic attack (TIA), and cerebral infarction without residual deficits: Secondary | ICD-10-CM | POA: Insufficient documentation

## 2015-02-05 MED ORDER — ACETAMINOPHEN 325 MG PO TABS
650.0000 mg | ORAL_TABLET | Freq: Once | ORAL | Status: AC
  Administered 2015-02-05: 650 mg via ORAL
  Filled 2015-02-05: qty 2

## 2015-02-05 NOTE — ED Notes (Signed)
DC papers reviewed with patient. Pt verbalizes understanding  And does not argue discharge.

## 2015-02-05 NOTE — ED Notes (Addendum)
PT REFUSED BLOOD DRAW X 3 RN MADE AWARE

## 2015-02-05 NOTE — ED Notes (Signed)
Pt continually making angry outbursts.  However, outbursts are not toward staff and are random.

## 2015-02-05 NOTE — ED Notes (Signed)
[facility] at bedside. Pt alert,oriented, and ambulated to [facility] truck.  She takes all belongings with her.

## 2015-02-05 NOTE — ED Notes (Signed)
Bed: ZO10 Expected date:  Expected time:  Means of arrival:  Comments: Unresponsive ? Vagal, ? Responsive on EMS arrival

## 2015-02-05 NOTE — ED Notes (Signed)
PT INFO ME I COULD GET SOME BLOOD WORK FROM HER.ONCE I STARTED PT INFO ME SHE DOES NOT WANT TO GIVE BLOOD TAKE EVERYTHING OUT AND OFF OF HER. NURSE HAVE BEEN INFO

## 2015-02-05 NOTE — ED Notes (Addendum)
Pt refuses to allow staff to assist her to the restroom to provide urine sample, or allow for bloodwork. RN aware

## 2015-02-05 NOTE — ED Notes (Addendum)
Spoke with patient again per Dr. Eliot Ford request.  Patient refuses to let us draw any blood from her.  When questioned why, she screamed "because it makes her weaker and she is not allowing any further harmful treatment from [place]".    Dr. Freida Busman made aware of patient's response.

## 2015-02-05 NOTE — ED Notes (Addendum)
Report called to [facility] spoke to [place].

## 2015-02-05 NOTE — Discharge Instructions (Signed)
You have deferred blood work at this time. Chest x-ray was negative. Follow-up with your doctor as needed

## 2015-02-05 NOTE — ED Notes (Signed)
[facility] has been called  

## 2015-02-05 NOTE — ED Provider Notes (Signed)
CSN: [id number]     Arrival date & time 02/05/15  1638 History   First MD Initiated Contact with Patient 02/05/15 1717     Chief Complaint  Patient presents with  . Headache  . Cough     (Consider location/radiation/quality/duration/timing/severity/associated sxs/prior Treatment) HPI Comments: Here from her nursing home complaining of cough and congestion. Some associated mild frontal headache 1 day. No associated fever or chills. No dysuria or hematuria. No neck pain. No photophobia. Became upset with staff members at [facility] care where she lives and was sent here for further management. Symptoms persistent and nothing makes them better. No treatment use prior to arrival.  Patient is a 54 y.o. female presenting with headaches and cough. The history is provided by the patient.  Headache Associated symptoms: cough   Cough Associated symptoms: headaches     Past Medical History  Diagnosis Date  . Tardive dyskinesia   . COPD (chronic obstructive pulmonary disease)   . Depression   . Hypertension   . Hypothyroidism   . CVA (cerebral infarction)   . Schizophrenia    History reviewed. No pertinent past surgical history. History reviewed. No pertinent family history. Social History  Substance Use Topics  . Smoking status: Current Every Day Smoker -- 1.00 packs/day    Types: Cigarettes  . Smokeless tobacco: None  . Alcohol Use: No   OB History    No data available     Review of Systems  Respiratory: Positive for cough.   Neurological: Positive for headaches.  All other systems reviewed and are negative.     Allergies  Fish allergy  Home Medications   Prior to Admission medications   Medication Sig Start Date End Date Taking? Authorizing Provider  albuterol (PROVENTIL HFA;VENTOLIN HFA) 108 (90 BASE) MCG/ACT inhaler Inhale 2 puffs into the lungs every 6 (six) hours as needed for wheezing or shortness of breath.   Yes Historical Provider, MD  benztropine (COGENTIN) 0.5  MG tablet Take 0.5 mg by mouth at bedtime.   Yes Historical Provider, MD  cetirizine (ZYRTEC) 10 MG tablet Take 1 tablet (10 mg total) by mouth daily. 11/11/14  Yes Beau Fanny, FNP  famotidine (PEPCID) 20 MG tablet Take 1 tablet (20 mg total) by mouth 2 (two) times daily. 11/11/14  Yes Beau Fanny, FNP  fluPHENAZine (PROLIXIN) 5 MG/ML solution Take 10 mg by mouth 2 (two) times daily. Dose with 60 ML of water prior to administration   Yes Historical Provider, MD  fluticasone (FLONASE) 50 MCG/ACT nasal spray Place 50 sprays into both nostrils 2 (two) times daily as needed. Patient taking differently: Place 50 sprays into both nostrils 2 (two) times daily as needed for allergies or rhinitis.  11/11/14  Yes Beau Fanny, FNP  furosemide (LASIX) 20 MG tablet Take 1 tablet (20 mg total) by mouth daily. 11/11/14  Yes Beau Fanny, FNP  ibuprofen (ADVIL,MOTRIN) 400 MG tablet Take 400 mg by mouth 2 (two) times daily as needed for moderate pain.   Yes Historical Provider, MD  levothyroxine (SYNTHROID, LEVOTHROID) 50 MCG tablet Take 1 tablet (50 mcg total) by mouth daily. 11/11/14  Yes Beau Fanny, FNP  lisinopril (PRINIVIL,ZESTRIL) 5 MG tablet Take 5 mg by mouth daily.   Yes Historical Provider, MD  LORazepam (ATIVAN) 0.5 MG tablet Take 0.5 mg by mouth at bedtime. May take 1 tablet every 8 hours as needed for anxiety/agitation 09/09/14  Yes Historical Provider, MD  metFORMIN (GLUCOPHAGE) 1000 MG tablet  Take 1 tablet (1,000 mg total) by mouth 2 (two) times daily. 11/11/14  Yes Beau Fanny, FNP  Multiple Vitamins-Minerals (MULTIVITAMIN WITH MINERALS) tablet Take 1 tablet by mouth daily. 11/11/14  Yes Beau Fanny, FNP  nicotine (NICODERM CQ - DOSED IN MG/24 HOURS) 21 mg/24hr patch Place 1 patch (21 mg total) onto the skin daily. 11/11/14  Yes Beau Fanny, FNP  ondansetron (ZOFRAN-ODT) 4 MG disintegrating tablet Take 4 mg by mouth 3 (three) times daily as needed for nausea.  09/09/14  Yes Historical  Provider, MD  potassium chloride (K-DUR,KLOR-CON) 10 MEQ tablet Take 1 tablet (10 mEq total) by mouth daily. 11/11/14  Yes Beau Fanny, FNP  tiotropium (SPIRIVA) 18 MCG inhalation capsule Place 18 mcg into inhaler and inhale daily.   Yes Historical Provider, MD  fluPHENAZine (PROLIXIN) 10 MG tablet Take 1 tablet (10 mg total) by mouth at bedtime. Patient not taking: Reported on 12/09/2014 11/11/14   Beau Fanny, FNP  LANTUS 100 UNIT/ML injection Inject 0.01 mLs (1 Units total) into the skin daily. And titrate dose as directed by prescriber Patient not taking: Reported on 12/09/2014 11/11/14   Beau Fanny, FNP  losartan (COZAAR) 25 MG tablet Take 1 tablet (25 mg total) by mouth daily. Patient not taking: Reported on 12/09/2014 11/11/14   Beau Fanny, FNP   BP 111/63 mmHg  Pulse 107  Temp(Src) 99.1 F (37.3 C) (Oral)  Resp 18  SpO2 96% Physical Exam  Constitutional: She is oriented to person, place, and time. She appears well-developed and well-nourished.  Non-toxic appearance. No distress.  HENT:  Head: Normocephalic and atraumatic.  Eyes: Conjunctivae, EOM and lids are normal. Pupils are equal, round, and reactive to light.  Neck: Normal range of motion. Neck supple. No tracheal deviation present. No thyroid mass present.  Cardiovascular: Regular rhythm and normal heart sounds.  Tachycardia present.  Exam reveals no gallop.   No murmur heard. Pulmonary/Chest: Effort normal and breath sounds normal. No stridor. No respiratory distress. She has no decreased breath sounds. She has no wheezes. She has no rhonchi. She has no rales.  Abdominal: Soft. Normal appearance and bowel sounds are normal. She exhibits no distension. There is no tenderness. There is no rebound and no CVA tenderness.  Musculoskeletal: Normal range of motion. She exhibits no edema or tenderness.  Neurological: She is alert and oriented to person, place, and time. She has normal strength. No cranial nerve deficit or  sensory deficit. GCS eye subscore is 4. GCS verbal subscore is 5. GCS motor subscore is 6.  Skin: Skin is warm and dry. No abrasion and no rash noted.  Psychiatric: Her speech is normal and behavior is normal. Her mood appears anxious.  Nursing note and vitals reviewed.   ED Course  Procedures (including critical care time) Labs Review Labs Reviewed  URINE CULTURE  URINALYSIS, ROUTINE W REFLEX MICROSCOPIC (NOT AT [facility])  CBC WITH DIFFERENTIAL/PLATELET  COMPREHENSIVE METABOLIC PANEL    Imaging Review No results found. I have personally reviewed and evaluated these images and lab results as part of my medical decision-making.   EKG Interpretation None      MDM   Final diagnoses:  None    Patient offered blood work which she has refused. She has capacity to make this decision. Suspect that she likely has a viral illness. Chest x-ray is negative. We discharged back to her facility    Lorre Nick, MD 02/05/15 1905

## 2015-02-05 NOTE — ED Notes (Addendum)
Per EMS, Pt, from [facility], c/o headache starting this morning and congested cough x "a while."  Pain score 10/10.  Pt has not taken anything for pain.

## 2015-02-07 ENCOUNTER — Emergency Department (HOSPITAL_COMMUNITY)
Admission: EM | Admit: 2015-02-07 | Discharge: 2015-02-07 | Disposition: A | Discharge status: Home / Self Care | Payer: Medicare Other | Attending: Emergency Medicine | Admitting: Emergency Medicine

## 2015-02-07 ENCOUNTER — Encounter (HOSPITAL_COMMUNITY): Payer: Self-pay | Admitting: Nurse Practitioner

## 2015-02-07 DIAGNOSIS — J449 Chronic obstructive pulmonary disease, unspecified: Secondary | ICD-10-CM | POA: Diagnosis not present

## 2015-02-07 DIAGNOSIS — Z79899 Other long term (current) drug therapy: Secondary | ICD-10-CM | POA: Diagnosis not present

## 2015-02-07 DIAGNOSIS — E039 Hypothyroidism, unspecified: Secondary | ICD-10-CM | POA: Diagnosis not present

## 2015-02-07 DIAGNOSIS — Y92128 Other place in nursing home as the place of occurrence of the external cause: Secondary | ICD-10-CM | POA: Insufficient documentation

## 2015-02-07 DIAGNOSIS — S300XXA Contusion of lower back and pelvis, initial encounter: Secondary | ICD-10-CM | POA: Insufficient documentation

## 2015-02-07 DIAGNOSIS — Y998 Other external cause status: Secondary | ICD-10-CM | POA: Insufficient documentation

## 2015-02-07 DIAGNOSIS — Z720 Tobacco use: Secondary | ICD-10-CM | POA: Insufficient documentation

## 2015-02-07 DIAGNOSIS — Y9389 Activity, other specified: Secondary | ICD-10-CM | POA: Diagnosis not present

## 2015-02-07 DIAGNOSIS — Z8673 Personal history of transient ischemic attack (TIA), and cerebral infarction without residual deficits: Secondary | ICD-10-CM | POA: Diagnosis not present

## 2015-02-07 DIAGNOSIS — Z794 Long term (current) use of insulin: Secondary | ICD-10-CM | POA: Insufficient documentation

## 2015-02-07 DIAGNOSIS — W010XXA Fall on same level from slipping, tripping and stumbling without subsequent striking against object, initial encounter: Secondary | ICD-10-CM | POA: Diagnosis not present

## 2015-02-07 DIAGNOSIS — I10 Essential (primary) hypertension: Secondary | ICD-10-CM | POA: Insufficient documentation

## 2015-02-07 DIAGNOSIS — Z8669 Personal history of other diseases of the nervous system and sense organs: Secondary | ICD-10-CM | POA: Insufficient documentation

## 2015-02-07 DIAGNOSIS — Z8659 Personal history of other mental and behavioral disorders: Secondary | ICD-10-CM | POA: Insufficient documentation

## 2015-02-07 DIAGNOSIS — S3992XA Unspecified injury of lower back, initial encounter: Secondary | ICD-10-CM | POA: Diagnosis present

## 2015-02-07 MED ORDER — TRAMADOL HCL 50 MG PO TABS
50.0000 mg | ORAL_TABLET | Freq: Once | ORAL | Status: AC
  Administered 2015-02-07: 50 mg via ORAL
  Filled 2015-02-07: qty 1

## 2015-02-07 MED ORDER — HYDROCODONE-ACETAMINOPHEN 5-325 MG PO TABS: 1.0000 | ORAL_TABLET | ORAL | Status: DC | PRN

## 2015-02-07 NOTE — Discharge Instructions (Signed)
Contusion °A contusion is a deep bruise. Contusions are the result of an injury that caused bleeding under the skin. The contusion may turn blue, purple, or yellow. Minor injuries will give you a painless contusion, but more severe contusions may stay painful and swollen for a few weeks.  °CAUSES  °A contusion is usually caused by a blow, trauma, or direct force to an area of the body. °SYMPTOMS  °· Swelling and redness of the injured area. °· Bruising of the injured area. °· Tenderness and soreness of the injured area. °· Pain. °DIAGNOSIS  °The diagnosis can be made by taking a history and physical exam. An X-ray, CT scan, or MRI may be needed to determine if there were any associated injuries, such as fractures. °TREATMENT  °Specific treatment will depend on what area of the body was injured. In general, the best treatment for a contusion is resting, icing, elevating, and applying cold compresses to the injured area. Over-the-counter medicines may also be recommended for pain control. Ask your caregiver what the best treatment is for your contusion. °HOME CARE INSTRUCTIONS  °· Put ice on the injured area. °¨ Put ice in a plastic bag. °¨ Place a towel between your skin and the bag. °¨ Leave the ice on for 15-20 minutes, 3-4 times a day, or as directed by your health care provider. °· Only take over-the-counter or prescription medicines for pain, discomfort, or fever as directed by your caregiver. Your caregiver may recommend avoiding anti-inflammatory medicines (aspirin, ibuprofen, and naproxen) for 48 hours because these medicines may increase bruising. °· Rest the injured area. °· If possible, elevate the injured area to reduce swelling. °SEEK IMMEDIATE MEDICAL CARE IF:  °· You have increased bruising or swelling. °· You have pain that is getting worse. °· Your swelling or pain is not relieved with medicines. °MAKE SURE YOU:  °· Understand these instructions. °· Will watch your condition. °· Will get help right  away if you are not doing well or get worse. °Document Released: 02/20/2005 Document Revised: 05/18/2013 Document Reviewed: 03/18/2011 °[employer]® Patient Information ©2015 [employer]. This information is not intended to replace advice given to you by your health care provider. Make sure you discuss any questions you have with your health care provider. ° °

## 2015-02-07 NOTE — ED Notes (Signed)
Pt is from [facility], facility sent her because she fell and landed on her buttocks, ambulatory during this triage assessment, asking for "strong medication."

## 2015-02-07 NOTE — ED Notes (Signed)
Bed: WTR8 Expected date:  Expected time:  Means of arrival:  Comments: EMS/69F/fall/nsg home/R buttock pain

## 2015-02-07 NOTE — ED Provider Notes (Signed)
CSN: [id number]     Arrival date & time 02/07/15  2046 History  This chart was scribed for non-physician practitioner Antony Madura, PA-C, working with Eber Hong, MD, by Tanda Rockers, ED Scribe. This patient was seen in room WTR8/WTR8 and the patient's care was started at 9:05 PM.  Chief Complaint  Patient presents with  . Buttock Pain   . Fall   The history is provided by the patient. No language interpreter was used.     HPI Comments: Carrie Clark is a 54 y.o. female brought in by ambulance, with hx schizophrenia who presents to the Emergency Department complaining of sudden onset, severe, right buttock pain s/p ground level fall that occurred approximately 1 hour ago. Pt states that she tripped, causing her to fall. No head injury or LOC. Pt was given 2 Tylenol en route without relief. She would like something else for the pain. Pt is able to ambulate without difficulty. Denies back pain, neck pain, or any other associated symptoms.    Past Medical History  Diagnosis Date  . Tardive dyskinesia   . COPD (chronic obstructive pulmonary disease)   . Depression   . Hypertension   . Hypothyroidism   . CVA (cerebral infarction)   . Schizophrenia    History reviewed. No pertinent past surgical history. History reviewed. No pertinent family history. Social History  Substance Use Topics  . Smoking status: Current Every Day Smoker -- 1.00 packs/day    Types: Cigarettes  . Smokeless tobacco: None  . Alcohol Use: No   OB History    No data available      Review of Systems  Musculoskeletal: Positive for arthralgias (Right buttock pain). Negative for back pain and neck pain.  Neurological: Negative for syncope.  All other systems reviewed and are negative.   Allergies  Fish allergy  Home Medications   Prior to Admission medications   Medication Sig Start Date End Date Taking? Authorizing Provider  albuterol (PROVENTIL HFA;VENTOLIN HFA) 108 (90 BASE) MCG/ACT inhaler Inhale 2  puffs into the lungs every 6 (six) hours as needed for wheezing or shortness of breath.    Historical Provider, MD  benztropine (COGENTIN) 0.5 MG tablet Take 0.5 mg by mouth at bedtime.    Historical Provider, MD  cetirizine (ZYRTEC) 10 MG tablet Take 1 tablet (10 mg total) by mouth daily. 11/11/14   Beau Fanny, FNP  famotidine (PEPCID) 20 MG tablet Take 1 tablet (20 mg total) by mouth 2 (two) times daily. 11/11/14   Beau Fanny, FNP  fluPHENAZine (PROLIXIN) 10 MG tablet Take 1 tablet (10 mg total) by mouth at bedtime. Patient not taking: Reported on 12/09/2014 11/11/14   Beau Fanny, FNP  fluPHENAZine (PROLIXIN) 5 MG/ML solution Take 10 mg by mouth 2 (two) times daily. Dose with 60 ML of water prior to administration    Historical Provider, MD  fluticasone (FLONASE) 50 MCG/ACT nasal spray Place 50 sprays into both nostrils 2 (two) times daily as needed. Patient taking differently: Place 50 sprays into both nostrils 2 (two) times daily as needed for allergies or rhinitis.  11/11/14   Beau Fanny, FNP  furosemide (LASIX) 20 MG tablet Take 1 tablet (20 mg total) by mouth daily. 11/11/14   Beau Fanny, FNP  HYDROcodone-acetaminophen (NORCO/VICODIN) 5-325 MG per tablet Take 1 tablet by mouth every 4 (four) hours as needed. 02/07/15   Antony Madura, PA-C  ibuprofen (ADVIL,MOTRIN) 400 MG tablet Take 400 mg by mouth 2 (two)  times daily as needed for moderate pain.    Historical Provider, MD  LANTUS 100 UNIT/ML injection Inject 0.01 mLs (1 Units total) into the skin daily. And titrate dose as directed by prescriber Patient not taking: Reported on 12/09/2014 11/11/14   Beau Fanny, FNP  levothyroxine (SYNTHROID, LEVOTHROID) 50 MCG tablet Take 1 tablet (50 mcg total) by mouth daily. 11/11/14   Beau Fanny, FNP  lisinopril (PRINIVIL,ZESTRIL) 5 MG tablet Take 5 mg by mouth daily.    Historical Provider, MD  LORazepam (ATIVAN) 0.5 MG tablet Take 0.5 mg by mouth at bedtime. May take 1 tablet every 8  hours as needed for anxiety/agitation 09/09/14   Historical Provider, MD  losartan (COZAAR) 25 MG tablet Take 1 tablet (25 mg total) by mouth daily. Patient not taking: Reported on 12/09/2014 11/11/14   Beau Fanny, FNP  metFORMIN (GLUCOPHAGE) 1000 MG tablet Take 1 tablet (1,000 mg total) by mouth 2 (two) times daily. 11/11/14   Beau Fanny, FNP  Multiple Vitamins-Minerals (MULTIVITAMIN WITH MINERALS) tablet Take 1 tablet by mouth daily. 11/11/14   Beau Fanny, FNP  nicotine (NICODERM CQ - DOSED IN MG/24 HOURS) 21 mg/24hr patch Place 1 patch (21 mg total) onto the skin daily. 11/11/14   Beau Fanny, FNP  ondansetron (ZOFRAN-ODT) 4 MG disintegrating tablet Take 4 mg by mouth 3 (three) times daily as needed for nausea.  09/09/14   Historical Provider, MD  potassium chloride (K-DUR,KLOR-CON) 10 MEQ tablet Take 1 tablet (10 mEq total) by mouth daily. 11/11/14   Beau Fanny, FNP  tiotropium (SPIRIVA) 18 MCG inhalation capsule Place 18 mcg into inhaler and inhale daily.    Historical Provider, MD   Triage Vitals: BP 126/62 mmHg  Pulse 96  Temp(Src) 98.6 F (37 C) (Oral)  Resp 14  SpO2 96%   Physical Exam  Constitutional: She is oriented to person, place, and time. She appears well-developed and well-nourished. No distress.  Nontoxic/nonseptic appearing  HENT:  Head: Normocephalic and atraumatic.  Eyes: Conjunctivae and EOM are normal. No scleral icterus.  Neck: Normal range of motion.  Pulmonary/Chest: Effort normal. No respiratory distress.  Respirations even and unlabored  Musculoskeletal: Normal range of motion.       Right hip: She exhibits tenderness. She exhibits normal range of motion, no bony tenderness, no crepitus and no deformity.       Legs: No tenderness to palpation to the thoracic or lumbar midline. No bony deformities, step-offs, or crepitus.  Neurological: She is alert and oriented to person, place, and time. She exhibits normal muscle tone. Coordination normal.  GCS  15. Patient ambulatory with steady gait.  Skin: Skin is warm and dry. No rash noted. She is not diaphoretic. No erythema. No pallor.  Psychiatric: Her speech is normal. She is agitated.  Nursing note and vitals reviewed.   ED Course  Procedures (including critical care time)  DIAGNOSTIC STUDIES: Oxygen Saturation is 96% on RA, normal by my interpretation.    COORDINATION OF CARE: 9:07 PM-Discussed treatment plan which includes pain medication with pt at bedside and pt agreed to plan.   Labs Review Labs Reviewed - No data to display  Imaging Review No results found.   I have personally reviewed and evaluated these images and lab results as part of my medical decision-making.   EKG Interpretation None      MDM   Final diagnoses:  Contusion, buttock, initial encounter    54 year old female presents to the  emergency department for further evaluation of pain to her right buttock secondary to a fall. Patient in no acute distress. She is ambulatory in the emergency department. She has a palpable contusion to her right buttock without ecchymosis or hematoma. No tenderness to palpation to the thoracic or lumbosacral midline. No bony deformities, step-offs, or crepitus. No red flags or signs concerning for cauda equina. Symptoms to be managed with ice and short course of Norco. Will refer to PCP for recheck. Return precautions given at discharge. Patient stable for discharge back to [facility] via [facility].  I personally performed the services described in this documentation, which was scribed in my presence. The recorded information has been reviewed and is accurate.   Filed Vitals:   02/07/15 2101  BP: 126/62  Pulse: 96  Temp: 98.6 F (37 C)  TempSrc: Oral  Resp: 14  SpO2: 96%        Antony Madura, PA-C 02/07/15 2156  Eber Hong, MD 02/09/15 2157

## 2015-04-02 ENCOUNTER — Encounter (HOSPITAL_COMMUNITY): Payer: Self-pay | Admitting: Emergency Medicine

## 2015-04-02 ENCOUNTER — Emergency Department (HOSPITAL_COMMUNITY)
Admission: EM | Admit: 2015-04-02 | Discharge: 2015-04-02 | Disposition: A | Discharge status: Home / Self Care | Payer: Medicare Other | Attending: Emergency Medicine | Admitting: Emergency Medicine

## 2015-04-02 DIAGNOSIS — R112 Nausea with vomiting, unspecified: Secondary | ICD-10-CM | POA: Diagnosis not present

## 2015-04-02 DIAGNOSIS — Z8669 Personal history of other diseases of the nervous system and sense organs: Secondary | ICD-10-CM | POA: Diagnosis not present

## 2015-04-02 DIAGNOSIS — Z7951 Long term (current) use of inhaled steroids: Secondary | ICD-10-CM | POA: Diagnosis not present

## 2015-04-02 DIAGNOSIS — Z8673 Personal history of transient ischemic attack (TIA), and cerebral infarction without residual deficits: Secondary | ICD-10-CM | POA: Insufficient documentation

## 2015-04-02 DIAGNOSIS — E039 Hypothyroidism, unspecified: Secondary | ICD-10-CM | POA: Diagnosis not present

## 2015-04-02 DIAGNOSIS — Z720 Tobacco use: Secondary | ICD-10-CM | POA: Insufficient documentation

## 2015-04-02 DIAGNOSIS — Z79899 Other long term (current) drug therapy: Secondary | ICD-10-CM | POA: Diagnosis not present

## 2015-04-02 DIAGNOSIS — J449 Chronic obstructive pulmonary disease, unspecified: Secondary | ICD-10-CM | POA: Diagnosis not present

## 2015-04-02 DIAGNOSIS — I10 Essential (primary) hypertension: Secondary | ICD-10-CM | POA: Insufficient documentation

## 2015-04-02 DIAGNOSIS — F329 Major depressive disorder, single episode, unspecified: Secondary | ICD-10-CM | POA: Diagnosis not present

## 2015-04-02 NOTE — ED Provider Notes (Signed)
CSN: [id number]     Arrival date & time 04/02/15  1027 History   First MD Initiated Contact with Patient 04/02/15 1131     Chief Complaint  Patient presents with  . Emesis    vomited x 1 this am. denies pain     (Consider location/radiation/quality/duration/timing/severity/associated sxs/prior Treatment) Patient is a 54 y.o. female presenting with vomiting. The history is provided by the patient.  Emesis  patient presents from her nursing home. Reportedly had one episode of vomiting. States she just felt unsteady. States she feels much better now. No chest pain. No trouble breathing. No headache. No confusion. No dysuria. States she just wants to go back home. States she no longer feels nauseous.  Past Medical History  Diagnosis Date  . Tardive dyskinesia   . COPD (chronic obstructive pulmonary disease) (HCC)   . Depression   . Hypertension   . Hypothyroidism   . CVA (cerebral infarction)   . Schizophrenia (HCC)    History reviewed. No pertinent past surgical history. Family History  Problem Relation Age of Onset  . Family history unknown: Yes   Social History  Substance Use Topics  . Smoking status: Current Every Day Smoker -- 1.00 packs/day    Types: Cigarettes  . Smokeless tobacco: None  . Alcohol Use: No   OB History    No data available     Review of Systems  Constitutional: Negative for appetite change.  Respiratory: Negative for shortness of breath.   Gastrointestinal: Positive for nausea and vomiting.  Endocrine: Negative for polydipsia.  Genitourinary: Negative for flank pain.  Musculoskeletal: Negative for joint swelling.  Skin: Negative for wound.  Neurological: Negative for light-headedness.      Allergies  Review of patient's allergies indicates no known allergies.  Home Medications   Prior to Admission medications   Medication Sig Start Date End Date Taking? Authorizing Provider  albuterol (PROVENTIL HFA;VENTOLIN HFA) 108 (90 BASE) MCG/ACT  inhaler Inhale 2 puffs into the lungs every 6 (six) hours as needed for wheezing or shortness of breath.   Yes Historical Provider, MD  benztropine (COGENTIN) 0.5 MG tablet Take 0.5 mg by mouth at bedtime.   Yes Historical Provider, MD  cetirizine (ZYRTEC) 10 MG tablet Take 1 tablet (10 mg total) by mouth daily. 11/11/14  Yes Beau FannyJohn C Withrow, FNP  donepezil (ARICEPT) 5 MG tablet Take 5 mg by mouth at bedtime.   Yes Historical Provider, MD  famotidine (PEPCID) 20 MG tablet Take 1 tablet (20 mg total) by mouth 2 (two) times daily. 11/11/14  Yes Beau FannyJohn C Withrow, FNP  fluPHENAZine (PROLIXIN) 10 MG tablet Take 1 tablet (10 mg total) by mouth at bedtime. Patient taking differently: Take 10 mg by mouth 3 (three) times daily.  11/11/14  Yes Beau FannyJohn C Withrow, FNP  fluticasone (FLONASE) 50 MCG/ACT nasal spray Place 50 sprays into both nostrils 2 (two) times daily as needed. Patient taking differently: Place 50 sprays into both nostrils 2 (two) times daily as needed for allergies or rhinitis.  11/11/14  Yes Beau FannyJohn C Withrow, FNP  furosemide (LASIX) 20 MG tablet Take 1 tablet (20 mg total) by mouth daily. 11/11/14  Yes Beau FannyJohn C Withrow, FNP  HYDROcodone-acetaminophen (NORCO/VICODIN) 5-325 MG per tablet Take 1 tablet by mouth every 4 (four) hours as needed. Patient taking differently: Take 1 tablet by mouth every 4 (four) hours as needed for moderate pain.  02/07/15  Yes Antony MaduraKelly Humes, PA-C  ibuprofen (ADVIL,MOTRIN) 400 MG tablet Take 400 mg by  mouth 2 (two) times daily as needed for moderate pain.   Yes Historical Provider, MD  LANTUS 100 UNIT/ML injection Inject 0.01 mLs (1 Units total) into the skin daily. And titrate dose as directed by prescriber Patient taking differently: Inject 30 Units into the skin at bedtime. Depends on BG level 11/11/14  Yes Beau Fanny, FNP  levothyroxine (SYNTHROID, LEVOTHROID) 50 MCG tablet Take 1 tablet (50 mcg total) by mouth daily. 11/11/14  Yes Beau Fanny, FNP  lisinopril  (PRINIVIL,ZESTRIL) 5 MG tablet Take 5 mg by mouth daily.   Yes Historical Provider, MD  LORazepam (ATIVAN) 0.5 MG tablet Take 0.5 mg by mouth at bedtime. May take 1 tablet every 8 hours as needed for anxiety/agitation 09/09/14  Yes Historical Provider, MD  metFORMIN (GLUCOPHAGE) 1000 MG tablet Take 1 tablet (1,000 mg total) by mouth 2 (two) times daily. 11/11/14  Yes Beau Fanny, FNP  mirtazapine (REMERON) 30 MG tablet Take 30 mg by mouth daily.   Yes Historical Provider, MD  Multiple Vitamins-Minerals (MULTIVITAMIN WITH MINERALS) tablet Take 1 tablet by mouth daily. 11/11/14  Yes Beau Fanny, FNP  nicotine (NICODERM CQ - DOSED IN MG/24 HOURS) 21 mg/24hr patch Place 1 patch (21 mg total) onto the skin daily. 11/11/14  Yes Beau Fanny, FNP  ondansetron (ZOFRAN-ODT) 4 MG disintegrating tablet Take 4 mg by mouth 3 (three) times daily as needed for nausea.  09/09/14  Yes Historical Provider, MD  potassium chloride (K-DUR,KLOR-CON) 10 MEQ tablet Take 1 tablet (10 mEq total) by mouth daily. 11/11/14  Yes Beau Fanny, FNP  tiotropium (SPIRIVA) 18 MCG inhalation capsule Place 18 mcg into inhaler and inhale daily.   Yes Historical Provider, MD  losartan (COZAAR) 25 MG tablet Take 1 tablet (25 mg total) by mouth daily. Patient not taking: Reported on 12/09/2014 11/11/14   Everardo All Withrow, FNP   BP 132/67 mmHg  Pulse 82  Temp(Src) 98.7 F (37.1 C) (Oral)  Resp 20  SpO2 96% Physical Exam  Constitutional: She appears well-developed.  HENT:  Head: Atraumatic.  Eyes: Pupils are equal, round, and reactive to light.  Neck: Neck supple.  Cardiovascular: Normal rate.   Pulmonary/Chest: Effort normal.  Abdominal: Soft.  Musculoskeletal: Normal range of motion.  Neurological: She is alert.  Skin: Skin is warm.  Psychiatric:  Somewhat flat affect    ED Course  Procedures (including critical care time) Labs Review Labs Reviewed - No data to display  Imaging Review No results found. I have  personally reviewed and evaluated these images and lab results as part of my medical decision-making.   EKG Interpretation None      MDM   Final diagnoses:  Non-intractable vomiting with nausea, vomiting of unspecified type     patient comes from nursing home with one episode of vomiting. Tolerated orals here.  She appears better baseline. Will discharge home.    Benjiman Core, MD 04/03/15 [id number]

## 2015-04-02 NOTE — ED Notes (Signed)
Per EMS-#12. [facility] staff called EMS with patient c/o vomiting x 1. Pt currently denies NVD or pain. Pt is alert, oriented , appropriate and cooperative

## 2015-04-02 NOTE — ED Notes (Signed)
Bed: WA31 Expected date:  Expected time:  Means of arrival:  Comments: 

## 2015-04-02 NOTE — ED Notes (Signed)
Bed: WA26 Expected date:  Expected time:  Means of arrival:  Comments: 

## 2015-04-02 NOTE — Discharge Instructions (Signed)

## 2015-04-02 NOTE — ED Notes (Signed)
Bed: [id number] Expected date: 04/02/15 Expected time: 10:29 AM Means of arrival: Ambulance Comments: AMS from [facility] home

## 2015-06-07 ENCOUNTER — Emergency Department (HOSPITAL_COMMUNITY)
Admission: EM | Admit: 2015-06-07 | Discharge: 2015-06-08 | Discharge status: Against Medical Advice | Payer: Medicare Other | Attending: Emergency Medicine | Admitting: Emergency Medicine

## 2015-06-07 ENCOUNTER — Encounter (HOSPITAL_COMMUNITY): Payer: Self-pay

## 2015-06-07 ENCOUNTER — Emergency Department (HOSPITAL_COMMUNITY): Payer: Medicare Other

## 2015-06-07 DIAGNOSIS — I10 Essential (primary) hypertension: Secondary | ICD-10-CM | POA: Diagnosis not present

## 2015-06-07 DIAGNOSIS — J441 Chronic obstructive pulmonary disease with (acute) exacerbation: Secondary | ICD-10-CM | POA: Insufficient documentation

## 2015-06-07 DIAGNOSIS — Z7951 Long term (current) use of inhaled steroids: Secondary | ICD-10-CM | POA: Insufficient documentation

## 2015-06-07 DIAGNOSIS — F17210 Nicotine dependence, cigarettes, uncomplicated: Secondary | ICD-10-CM | POA: Insufficient documentation

## 2015-06-07 DIAGNOSIS — Z79899 Other long term (current) drug therapy: Secondary | ICD-10-CM | POA: Diagnosis not present

## 2015-06-07 DIAGNOSIS — Z8669 Personal history of other diseases of the nervous system and sense organs: Secondary | ICD-10-CM | POA: Diagnosis not present

## 2015-06-07 DIAGNOSIS — F329 Major depressive disorder, single episode, unspecified: Secondary | ICD-10-CM | POA: Insufficient documentation

## 2015-06-07 DIAGNOSIS — E039 Hypothyroidism, unspecified: Secondary | ICD-10-CM | POA: Diagnosis not present

## 2015-06-07 DIAGNOSIS — R062 Wheezing: Secondary | ICD-10-CM

## 2015-06-07 DIAGNOSIS — R0602 Shortness of breath: Secondary | ICD-10-CM | POA: Diagnosis present

## 2015-06-07 DIAGNOSIS — Z8673 Personal history of transient ischemic attack (TIA), and cerebral infarction without residual deficits: Secondary | ICD-10-CM | POA: Diagnosis not present

## 2015-06-07 MED ORDER — PREDNISONE 20 MG PO TABS
60.0000 mg | ORAL_TABLET | Freq: Once | ORAL | Status: AC
  Administered 2015-06-07: 60 mg via ORAL
  Filled 2015-06-07: qty 3

## 2015-06-07 MED ORDER — PREDNISONE 20 MG PO TABS: 60.0000 mg | ORAL_TABLET | Freq: Once | ORAL | Status: DC

## 2015-06-07 MED ORDER — ALBUTEROL SULFATE HFA 108 (90 BASE) MCG/ACT IN AERS: 2.0000 | INHALATION_SPRAY | Freq: Four times a day (QID) | RESPIRATORY_TRACT | Status: DC | PRN

## 2015-06-07 MED ORDER — ALBUTEROL (5 MG/ML) CONTINUOUS INHALATION SOLN
10.0000 mg/h | INHALATION_SOLUTION | Freq: Once | RESPIRATORY_TRACT | Status: AC
  Administered 2015-06-07: 10 mg/h via RESPIRATORY_TRACT
  Filled 2015-06-07: qty 20

## 2015-06-07 MED ORDER — AZITHROMYCIN 250 MG PO TABS: ORAL_TABLET | ORAL | Status: DC

## 2015-06-07 MED ORDER — ONDANSETRON HCL 4 MG/2ML IJ SOLN
4.0000 mg | Freq: Once | INTRAMUSCULAR | Status: AC
  Administered 2015-06-07: 4 mg via INTRAVENOUS
  Filled 2015-06-07: qty 2

## 2015-06-07 MED ORDER — MAGNESIUM SULFATE 2 GM/50ML IV SOLN
2.0000 g | Freq: Once | INTRAVENOUS | Status: AC
  Administered 2015-06-07: 2 g via INTRAVENOUS
  Filled 2015-06-07: qty 50

## 2015-06-07 MED ORDER — IPRATROPIUM BROMIDE 0.02 % IN SOLN
0.5000 mg | Freq: Once | RESPIRATORY_TRACT | Status: AC
  Administered 2015-06-07: 0.5 mg via RESPIRATORY_TRACT
  Filled 2015-06-07: qty 2.5

## 2015-06-07 NOTE — ED Notes (Signed)
[facility] called. They state that they do not have a way to transport the patient home. Their "transportation guy isnt there"

## 2015-06-07 NOTE — ED Notes (Signed)
Pt sister called. States that she is unable to pick pt up because it is 10pm and she is ready for bed.

## 2015-06-07 NOTE — ED Notes (Signed)
[facility] arrives via EMS [facility]. Complaining of SOB. EMS reports no difficulty ambulating, pt speaking in complete sentences, clear lung sounds. EMS vitals 122/66, 72 HR, 22 RR

## 2015-06-07 NOTE — Progress Notes (Signed)
RT attempted to give pt cont. Neb and patient took it off twice. RT explained to patient that this was beneficial to her lungs but she still refused to finish the rest of it.

## 2015-06-07 NOTE — ED Provider Notes (Signed)
CSN: [id number]     Arrival date & time 06/07/15  1913 History   First MD Initiated Contact with Patient 06/07/15 1915     Chief Complaint  Patient presents with  . Shortness of Breath     (Consider location/radiation/quality/duration/timing/severity/associated sxs/prior Treatment) Patient is a 55 y.o. female presenting with shortness of breath and cough.  Shortness of Breath Associated symptoms: cough   Cough Cough characteristics:  Non-productive Severity:  Mild Duration:  1 day Timing:  Constant Chronicity:  Recurrent Smoker: no   Context: not animal exposure   Relieved by:  None tried Worsened by:  Nothing tried Ineffective treatments:  None tried Associated symptoms: chills and shortness of breath     Past Medical History  Diagnosis Date  . Tardive dyskinesia   . COPD (chronic obstructive pulmonary disease) (HCC)   . Depression   . Hypertension   . Hypothyroidism   . CVA (cerebral infarction)   . Schizophrenia (HCC)    History reviewed. No pertinent past surgical history. Family History  Problem Relation Age of Onset  . Family history unknown: Yes   Social History  Substance Use Topics  . Smoking status: Current Every Day Smoker -- 1.00 packs/day    Types: Cigarettes  . Smokeless tobacco: None  . Alcohol Use: No   OB History    No data available     Review of Systems  Constitutional: Positive for chills.  Respiratory: Positive for cough and shortness of breath. Negative for chest tightness.   Neurological: Negative for seizures.  All other systems reviewed and are negative.     Allergies  Review of patient's allergies indicates no known allergies.  Home Medications   Prior to Admission medications   Medication Sig Start Date End Date Taking? Authorizing Provider  benztropine (COGENTIN) 0.5 MG tablet Take 0.5 mg by mouth at bedtime.   Yes Historical Provider, MD  cetirizine (ZYRTEC) 10 MG tablet Take 1 tablet (10 mg total) by mouth daily.  11/11/14  Yes Beau FannyJohn C Withrow, FNP  famotidine (PEPCID) 20 MG tablet Take 1 tablet (20 mg total) by mouth 2 (two) times daily. 11/11/14  Yes Beau FannyJohn C Withrow, FNP  fluPHENAZine (PROLIXIN) 10 MG tablet Take 1 tablet (10 mg total) by mouth at bedtime. Patient taking differently: Take 10 mg by mouth 3 (three) times daily.  11/11/14  Yes Beau FannyJohn C Withrow, FNP  fluticasone (FLONASE) 50 MCG/ACT nasal spray Place 50 sprays into both nostrils 2 (two) times daily as needed. Patient taking differently: Place 50 sprays into both nostrils 2 (two) times daily as needed for allergies or rhinitis.  11/11/14  Yes Beau FannyJohn C Withrow, FNP  furosemide (LASIX) 20 MG tablet Take 1 tablet (20 mg total) by mouth daily. 11/11/14  Yes Beau FannyJohn C Withrow, FNP  ibuprofen (ADVIL,MOTRIN) 400 MG tablet Take 400 mg by mouth 2 (two) times daily as needed for moderate pain.   Yes Historical Provider, MD  levothyroxine (SYNTHROID, LEVOTHROID) 50 MCG tablet Take 1 tablet (50 mcg total) by mouth daily. 11/11/14  Yes Beau FannyJohn C Withrow, FNP  lisinopril (PRINIVIL,ZESTRIL) 5 MG tablet Take 5 mg by mouth daily.   Yes Historical Provider, MD  LORazepam (ATIVAN) 0.5 MG tablet Take 0.5 mg by mouth at bedtime. May take 1 tablet every 8 hours as needed for anxiety/agitation 09/09/14  Yes Historical Provider, MD  metFORMIN (GLUCOPHAGE) 1000 MG tablet Take 1 tablet (1,000 mg total) by mouth 2 (two) times daily. 11/11/14  Yes Beau FannyJohn C Withrow, FNP  mirtazapine (REMERON) 30 MG tablet Take 30 mg by mouth daily.   Yes Historical Provider, MD  Multiple Vitamins-Minerals (MULTIVITAMIN WITH MINERALS) tablet Take 1 tablet by mouth daily. 11/11/14  Yes Beau Fanny, FNP  ondansetron (ZOFRAN-ODT) 4 MG disintegrating tablet Take 4 mg by mouth 3 (three) times daily as needed for nausea or vomiting.   Yes Historical Provider, MD  potassium chloride (K-DUR,KLOR-CON) 10 MEQ tablet Take 1 tablet (10 mEq total) by mouth daily. 11/11/14  Yes Beau Fanny, FNP  tiotropium (SPIRIVA) 18 MCG  inhalation capsule Place 18 mcg into inhaler and inhale daily.   Yes Historical Provider, MD  albuterol (PROVENTIL HFA;VENTOLIN HFA) 108 (90 Base) MCG/ACT inhaler Inhale 2 puffs into the lungs every 6 (six) hours as needed for wheezing or shortness of breath. 06/07/15   Marily Memos, MD  azithromycin (ZITHROMAX Z-PAK) 250 MG tablet 2 po day one, then 1 daily x 4 days 06/07/15   Marily Memos, MD  predniSONE (DELTASONE) 20 MG tablet Take 3 tablets (60 mg total) by mouth once. 06/08/15   Eithel Ryall, MD   BP 103/61 mmHg  Pulse 81  Temp(Src) 98.5 F (36.9 C) (Oral)  Resp 18  SpO2 93% Physical Exam  Constitutional: She is oriented to person, place, and time. She appears well-developed and well-nourished.  HENT:  Head: Normocephalic and atraumatic.  Eyes: Pupils are equal, round, and reactive to light.  Neck: Normal range of motion.  Cardiovascular: Normal rate and regular rhythm.   Pulmonary/Chest: Effort normal. No stridor. No respiratory distress. She has wheezes.  Abdominal: Soft. She exhibits no distension.  Musculoskeletal: Normal range of motion. She exhibits no edema or tenderness.  Neurological: She is alert and oriented to person, place, and time. No cranial nerve deficit.  Skin: Skin is warm and dry. No erythema.  Nursing note and vitals reviewed.   ED Course  Procedures (including critical care time) Labs Review Labs Reviewed - No data to display  Imaging Review Dg Chest 2 View  06/07/2015  CLINICAL DATA:  Shortness of breath and chest pain for several hours. COPD. Hypertension. Smoker. EXAM: CHEST  2 VIEW COMPARISON:  02/05/2015 FINDINGS: The heart size and mediastinal contours are within normal limits. Both lungs are clear. The visualized skeletal structures are unremarkable. IMPRESSION: No active cardiopulmonary disease. Electronically Signed   By: Myles Rosenthal M.D.   On: 06/07/2015 19:52   I have personally reviewed and evaluated these images and lab results as part of my  medical decision-making.   EKG Interpretation None      MDM   Final diagnoses:  Wheezing   Likely copd exacerbation. Will tx with treatments, steroids, cxr to eval for pneumonia.   patient refusing all medications and interventions. Her vital signs are stable. She is alert and oriented. She has a capacity make decisions and was leaving against my advice. I discussed with her possibly having pneumonia versus COPD exacerbation versus blood clot or many other possibilities that have not been went into. However she states she doesn't want "any of these fucking medications" " please take this fuckign thing out of my arm" when referring to the IV. Patient left AMA.     Marily Memos, MD 06/07/15 2117

## 2015-06-07 NOTE — ED Notes (Signed)
Pt. On monitor. 

## 2015-06-07 NOTE — ED Notes (Signed)
Resp called for continuous neb 

## 2015-06-07 NOTE — ED Notes (Signed)
Pt refused continuous neb per Amy (respiratory).

## 2015-06-07 NOTE — ED Notes (Signed)
Notified [facility] for transportation [facility].

## 2015-06-07 NOTE — ED Notes (Signed)
Bed: WA06 Expected date:  Expected time:  Means of arrival:  Comments[facility]54[facility]

## 2015-06-07 NOTE — ED Notes (Signed)
Pt tore out her IV with half of the magnesium in. She states that she no longer wants it. She is refusing all further treatment including the peak flow meter. Pt concerned that we are attempting to cremate her and demands to leave AMA. MD and Charge nurse have been notified.

## 2015-06-07 NOTE — ED Notes (Signed)
[facility]

## 2015-07-03 ENCOUNTER — Emergency Department (HOSPITAL_COMMUNITY): Payer: Medicare Other

## 2015-07-03 ENCOUNTER — Encounter (HOSPITAL_COMMUNITY): Payer: Self-pay | Admitting: Emergency Medicine

## 2015-07-03 ENCOUNTER — Emergency Department (HOSPITAL_COMMUNITY)
Admission: EM | Admit: 2015-07-03 | Discharge: 2015-07-03 | Disposition: A | Discharge status: Home / Self Care | Payer: Medicare Other | Attending: Emergency Medicine | Admitting: Emergency Medicine

## 2015-07-03 DIAGNOSIS — J449 Chronic obstructive pulmonary disease, unspecified: Secondary | ICD-10-CM | POA: Diagnosis not present

## 2015-07-03 DIAGNOSIS — IMO0002 Reserved for concepts with insufficient information to code with codable children: Secondary | ICD-10-CM

## 2015-07-03 DIAGNOSIS — Z79899 Other long term (current) drug therapy: Secondary | ICD-10-CM | POA: Diagnosis not present

## 2015-07-03 DIAGNOSIS — F911 Conduct disorder, childhood-onset type: Secondary | ICD-10-CM | POA: Diagnosis not present

## 2015-07-03 DIAGNOSIS — I10 Essential (primary) hypertension: Secondary | ICD-10-CM | POA: Insufficient documentation

## 2015-07-03 DIAGNOSIS — R4182 Altered mental status, unspecified: Secondary | ICD-10-CM

## 2015-07-03 DIAGNOSIS — F17210 Nicotine dependence, cigarettes, uncomplicated: Secondary | ICD-10-CM | POA: Insufficient documentation

## 2015-07-03 DIAGNOSIS — Z8669 Personal history of other diseases of the nervous system and sense organs: Secondary | ICD-10-CM | POA: Diagnosis not present

## 2015-07-03 DIAGNOSIS — Z7951 Long term (current) use of inhaled steroids: Secondary | ICD-10-CM | POA: Diagnosis not present

## 2015-07-03 DIAGNOSIS — Z8673 Personal history of transient ischemic attack (TIA), and cerebral infarction without residual deficits: Secondary | ICD-10-CM | POA: Diagnosis not present

## 2015-07-03 DIAGNOSIS — E039 Hypothyroidism, unspecified: Secondary | ICD-10-CM | POA: Diagnosis not present

## 2015-07-03 DIAGNOSIS — F329 Major depressive disorder, single episode, unspecified: Secondary | ICD-10-CM | POA: Insufficient documentation

## 2015-07-03 DIAGNOSIS — Z7982 Long term (current) use of aspirin: Secondary | ICD-10-CM | POA: Diagnosis not present

## 2015-07-03 DIAGNOSIS — F209 Schizophrenia, unspecified: Secondary | ICD-10-CM | POA: Insufficient documentation

## 2015-07-03 LAB — URINE MICROSCOPIC-ADD ON
RBC / HPF: NONE SEEN RBC/hpf (ref 0–5)
SQUAMOUS EPITHELIAL / LPF: NONE SEEN

## 2015-07-03 LAB — RAPID URINE DRUG SCREEN, HOSP PERFORMED
Amphetamines: NOT DETECTED
BARBITURATES: NOT DETECTED
Benzodiazepines: NOT DETECTED
Cocaine: NOT DETECTED
Opiates: NOT DETECTED
Tetrahydrocannabinol: NOT DETECTED

## 2015-07-03 LAB — ETHANOL: Alcohol, Ethyl (B): <5 mg/dL (ref <5)

## 2015-07-03 LAB — COMPREHENSIVE METABOLIC PANEL
ALBUMIN: 4 g/dL (ref 3.5–5.0)
ALT: 23 U/L (ref 14–54)
AST: 23 U/L (ref 15–41)
Alkaline Phosphatase: 88 U/L (ref 38–126)
Anion gap: 12 (ref 5–15)
BILIRUBIN TOTAL: 0.5 mg/dL (ref 0.3–1.2)
BUN: 20 mg/dL (ref 6–20)
CO2: 26 mmol/L (ref 22–32)
CREATININE: 0.86 mg/dL (ref 0.44–1.00)
Calcium: 9.5 mg/dL (ref 8.9–10.3)
Chloride: 99 mmol/L — ABNORMAL LOW (ref 101–111)
GFR calc Af Amer: >60 mL/min (ref >60)
GLUCOSE: 186 mg/dL — AB (ref 65–99)
POTASSIUM: 4.2 mmol/L (ref 3.5–5.1)
Sodium: 137 mmol/L (ref 135–145)
TOTAL PROTEIN: 6.5 g/dL (ref 6.5–8.1)

## 2015-07-03 LAB — CBC WITH DIFFERENTIAL/PLATELET
Basophils Absolute: 0.1 10*3/uL (ref 0.0–0.1)
Basophils Relative: 0 %
EOS PCT: 2 %
Eosinophils Absolute: 0.3 10*3/uL (ref 0.0–0.7)
HEMATOCRIT: 38.7 % (ref 36.0–46.0)
Hemoglobin: 12.8 g/dL (ref 12.0–15.0)
LYMPHS PCT: 41 %
Lymphs Abs: 5.6 10*3/uL — ABNORMAL HIGH (ref 0.7–4.0)
MCH: 31 pg (ref 26.0–34.0)
MCHC: 33.1 g/dL (ref 30.0–36.0)
MCV: 93.7 fL (ref 78.0–100.0)
MONO ABS: 0.9 10*3/uL (ref 0.1–1.0)
MONOS PCT: 6 %
NEUTROS ABS: 6.9 10*3/uL (ref 1.7–7.7)
Neutrophils Relative %: 51 %
PLATELETS: 331 10*3/uL (ref 150–400)
RBC: 4.13 MIL/uL (ref 3.87–5.11)
RDW: 14.5 % (ref 11.5–15.5)
WBC: 13.8 10*3/uL — ABNORMAL HIGH (ref 4.0–10.5)

## 2015-07-03 LAB — URINALYSIS, ROUTINE W REFLEX MICROSCOPIC
BILIRUBIN URINE: NEGATIVE
GLUCOSE, UA: NEGATIVE mg/dL
HGB URINE DIPSTICK: NEGATIVE
KETONES UR: NEGATIVE mg/dL
Nitrite: POSITIVE — AB
PH: 5 (ref 5.0–8.0)
PROTEIN: NEGATIVE mg/dL
Specific Gravity, Urine: 1.006 (ref 1.005–1.030)

## 2015-07-03 NOTE — Progress Notes (Signed)
Patient noted to have been seen in the ED 5 times within the last six  Months.  Patient is resident at [facility].  Pcp listed on nursing facility paperwork is Dr. Herma Mering.  System updated.
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
## 2015-07-03 NOTE — ED Notes (Signed)
Nurse will get labs 
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
## 2015-07-03 NOTE — ED Notes (Signed)
Alerted MD Phiffer to patient status, stated she would place orders on patient. Notified charge nurse, who stated we could move her to next available room.
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
## 2015-07-03 NOTE — ED Notes (Signed)
UNABLE TO COLLECT LABS PATIENT IS NOT IN THE ROOM 

## 2015-07-03 NOTE — Discharge Instructions (Signed)
Schizophrenia °Schizophrenia is a mental illness. It may cause disturbed or disorganized thinking, speech, or behavior. People with schizophrenia have problems functioning in one or more areas of life: work, school, home, or relationships. People with schizophrenia are at increased risk for suicide, certain chronic physical illnesses, and unhealthy behaviors, such as smoking and drug use. °People who have family members with schizophrenia are at higher risk of developing the illness. Schizophrenia affects men and women equally but usually appears at an earlier age (teenage or early adult years) in men.  °SYMPTOMS °The earliest symptoms are often subtle (prodrome) and may go unnoticed until the illness becomes more severe (first-break psychosis). Symptoms of schizophrenia may be continuous or may come and go in severity. Episodes often are triggered by major life events, such as family stress, college, military service, marriage, pregnancy or child birth, divorce, or loss of a loved one. People with schizophrenia may see, hear, or feel things that do not exist (hallucinations). They may have false beliefs in spite of obvious proof to the contrary (delusions). Sometimes speech is incoherent or behavior is odd or withdrawn.  °DIAGNOSIS °Schizophrenia is diagnosed through an assessment by your caregiver. Your caregiver will ask questions about your thoughts, behavior, mood, and ability to function in daily life. Your caregiver may ask questions about your medical history and use of alcohol or drugs, including prescription medication. Your caregiver may also order blood tests and imaging exams. Certain medical conditions and substances can cause symptoms that resemble schizophrenia. Your caregiver may refer you to a mental health specialist for evaluation. There are three major criterion for a diagnosis of schizophrenia: °· Two or more of the following five symptoms are present for a month or longer: °¨ Delusions. Often  the delusions are that you are being attacked, harassed, cheated, persecuted or conspired against (persecutory delusions). °¨ Hallucinations.   °¨ Disorganized speech that does not make sense to others. °¨ Grossly disorganized (confused or unfocused) behavior or extremely overactive or underactive motor activity (catatonia). °¨ Negative symptoms such as bland or blunted emotions (flat affect), loss of will power (avolition), and withdrawal from social contacts (social isolation). °· Level of functioning in one or more major areas of life (work, school, relationships, or self-care) is markedly below the level of functioning before the onset of illness.   °· There are continuous signs of illness (either mild symptoms or decreased level of functioning) for at least 6 months or longer. °TREATMENT  °Schizophrenia is a long-term illness. It is best controlled with continuous treatment rather than treatment only when symptoms occur. The following treatments are used to manage schizophrenia: °· Medication--Medication is the most effective and important form of treatment for schizophrenia. Antipsychotic medications are usually prescribed to help manage schizophrenia. Other types of medication may be added to relieve any symptoms that may occur despite the use of antipsychotic medications. °· Counseling or talk therapy--Individual, group, or family counseling may be helpful in providing education, support, and guidance. Many people with schizophrenia also benefit from social skills and job skills (vocational) training. °A combination of medication and counseling is best for managing the disorder over time. A procedure in which electricity is applied to the brain through the scalp (electroconvulsive therapy) may be used to treat catatonic schizophrenia or schizophrenia in people who cannot take or do not respond to medication and counseling. °  °This information is not intended to replace advice given to you by your health  care provider. Make sure you discuss any questions you have with   your health care provider. °  °Document Released: 05/10/2000 Document Revised: 06/03/2014 Document Reviewed: 08/05/2012 °Elsevier Interactive Patient Education ©2016 [employer] ° °

## 2015-07-03 NOTE — ED Notes (Signed)
Facility called and [facility] called.

## 2015-09-07 ENCOUNTER — Emergency Department (HOSPITAL_COMMUNITY)
Admission: EM | Admit: 2015-09-07 | Discharge: 2015-09-11 | Disposition: A | Discharge status: Home / Self Care | Payer: Medicare Other | Attending: Emergency Medicine | Admitting: Emergency Medicine

## 2015-09-07 ENCOUNTER — Encounter (HOSPITAL_COMMUNITY): Payer: Self-pay | Admitting: Emergency Medicine

## 2015-09-07 DIAGNOSIS — I10 Essential (primary) hypertension: Secondary | ICD-10-CM | POA: Diagnosis not present

## 2015-09-07 DIAGNOSIS — F419 Anxiety disorder, unspecified: Secondary | ICD-10-CM | POA: Insufficient documentation

## 2015-09-07 DIAGNOSIS — R443 Hallucinations, unspecified: Secondary | ICD-10-CM | POA: Diagnosis not present

## 2015-09-07 DIAGNOSIS — Z794 Long term (current) use of insulin: Secondary | ICD-10-CM | POA: Insufficient documentation

## 2015-09-07 DIAGNOSIS — Z8669 Personal history of other diseases of the nervous system and sense organs: Secondary | ICD-10-CM | POA: Insufficient documentation

## 2015-09-07 DIAGNOSIS — F919 Conduct disorder, unspecified: Secondary | ICD-10-CM | POA: Diagnosis not present

## 2015-09-07 DIAGNOSIS — Z7951 Long term (current) use of inhaled steroids: Secondary | ICD-10-CM | POA: Diagnosis not present

## 2015-09-07 DIAGNOSIS — Z8673 Personal history of transient ischemic attack (TIA), and cerebral infarction without residual deficits: Secondary | ICD-10-CM | POA: Insufficient documentation

## 2015-09-07 DIAGNOSIS — R451 Restlessness and agitation: Secondary | ICD-10-CM | POA: Diagnosis not present

## 2015-09-07 DIAGNOSIS — R45850 Homicidal ideations: Secondary | ICD-10-CM | POA: Insufficient documentation

## 2015-09-07 DIAGNOSIS — R4689 Other symptoms and signs involving appearance and behavior: Secondary | ICD-10-CM

## 2015-09-07 DIAGNOSIS — F329 Major depressive disorder, single episode, unspecified: Secondary | ICD-10-CM | POA: Diagnosis not present

## 2015-09-07 DIAGNOSIS — Z7982 Long term (current) use of aspirin: Secondary | ICD-10-CM | POA: Insufficient documentation

## 2015-09-07 DIAGNOSIS — Z046 Encounter for general psychiatric examination, requested by authority: Secondary | ICD-10-CM | POA: Diagnosis present

## 2015-09-07 DIAGNOSIS — J449 Chronic obstructive pulmonary disease, unspecified: Secondary | ICD-10-CM | POA: Diagnosis not present

## 2015-09-07 DIAGNOSIS — F250 Schizoaffective disorder, bipolar type: Secondary | ICD-10-CM | POA: Diagnosis not present

## 2015-09-07 DIAGNOSIS — E039 Hypothyroidism, unspecified: Secondary | ICD-10-CM | POA: Diagnosis not present

## 2015-09-07 DIAGNOSIS — Z79899 Other long term (current) drug therapy: Secondary | ICD-10-CM | POA: Insufficient documentation

## 2015-09-07 DIAGNOSIS — R45851 Suicidal ideations: Secondary | ICD-10-CM | POA: Diagnosis not present

## 2015-09-07 DIAGNOSIS — Z7984 Long term (current) use of oral hypoglycemic drugs: Secondary | ICD-10-CM | POA: Insufficient documentation

## 2015-09-07 DIAGNOSIS — F251 Schizoaffective disorder, depressive type: Secondary | ICD-10-CM | POA: Diagnosis not present

## 2015-09-07 DIAGNOSIS — F17210 Nicotine dependence, cigarettes, uncomplicated: Secondary | ICD-10-CM | POA: Diagnosis not present

## 2015-09-07 DIAGNOSIS — N390 Urinary tract infection, site not specified: Secondary | ICD-10-CM | POA: Insufficient documentation

## 2015-09-07 DIAGNOSIS — F6089 Other specific personality disorders: Secondary | ICD-10-CM | POA: Diagnosis not present

## 2015-09-07 LAB — URINE MICROSCOPIC-ADD ON

## 2015-09-07 LAB — URINALYSIS, ROUTINE W REFLEX MICROSCOPIC
Bilirubin Urine: NEGATIVE
GLUCOSE, UA: NEGATIVE mg/dL
HGB URINE DIPSTICK: NEGATIVE
KETONES UR: NEGATIVE mg/dL
NITRITE: POSITIVE — AB
PH: 5 (ref 5.0–8.0)
PROTEIN: NEGATIVE mg/dL
SPECIFIC GRAVITY, URINE: 1.006 (ref 1.005–1.030)

## 2015-09-07 LAB — COMPREHENSIVE METABOLIC PANEL
ALK PHOS: 74 U/L (ref 38–126)
ALT: 17 U/L (ref 14–54)
ANION GAP: 9 (ref 5–15)
AST: 23 U/L (ref 15–41)
Albumin: 4 g/dL (ref 3.5–5.0)
BUN: 19 mg/dL (ref 6–20)
CO2: 28 mmol/L (ref 22–32)
CREATININE: 0.83 mg/dL (ref 0.44–1.00)
Calcium: 9.8 mg/dL (ref 8.9–10.3)
Chloride: 101 mmol/L (ref 101–111)
Glucose, Bld: 157 mg/dL — ABNORMAL HIGH (ref 65–99)
Potassium: 4.4 mmol/L (ref 3.5–5.1)
Sodium: 138 mmol/L (ref 135–145)
Total Bilirubin: 0.3 mg/dL (ref 0.3–1.2)
Total Protein: 6.6 g/dL (ref 6.5–8.1)

## 2015-09-07 LAB — RAPID URINE DRUG SCREEN, HOSP PERFORMED
Amphetamines: NOT DETECTED
Barbiturates: NOT DETECTED
Benzodiazepines: NOT DETECTED
Cocaine: NOT DETECTED
OPIATES: NOT DETECTED
Tetrahydrocannabinol: NOT DETECTED

## 2015-09-07 LAB — CBG MONITORING, ED: Glucose-Capillary: 199 mg/dL — ABNORMAL HIGH (ref 65–99)

## 2015-09-07 LAB — ETHANOL

## 2015-09-07 LAB — CBC
HCT: 39.5 % (ref 36.0–46.0)
Hemoglobin: 13.3 g/dL (ref 12.0–15.0)
MCH: 31.1 pg (ref 26.0–34.0)
MCHC: 33.7 g/dL (ref 30.0–36.0)
MCV: 92.5 fL (ref 78.0–100.0)
PLATELETS: 279 10*3/uL (ref 150–400)
RBC: 4.27 MIL/uL (ref 3.87–5.11)
RDW: 13.2 % (ref 11.5–15.5)
WBC: 12.3 10*3/uL — ABNORMAL HIGH (ref 4.0–10.5)

## 2015-09-07 LAB — SALICYLATE LEVEL

## 2015-09-07 LAB — ACETAMINOPHEN LEVEL: Acetaminophen (Tylenol), Serum: <10 ug/mL — ABNORMAL LOW (ref 10–30)

## 2015-09-07 MED ORDER — TIOTROPIUM BROMIDE MONOHYDRATE 18 MCG IN CAPS
18.0000 ug | ORAL_CAPSULE | Freq: Every day | RESPIRATORY_TRACT | Status: DC
  Administered 2015-09-08 – 2015-09-10 (×3): 18 ug via RESPIRATORY_TRACT
  Filled 2015-09-07: qty 5

## 2015-09-07 MED ORDER — ALUM & MAG HYDROXIDE-SIMETH 200-200-20 MG/5ML PO SUSP
30.0000 mL | ORAL | Status: DC | PRN
Start: 2015-09-07 — End: 2015-09-11

## 2015-09-07 MED ORDER — FLUTICASONE PROPIONATE 50 MCG/ACT NA SUSP
2.0000 | Freq: Every evening | NASAL | Status: DC | PRN
  Filled 2015-09-07: qty 16

## 2015-09-07 MED ORDER — LEVOTHYROXINE SODIUM 50 MCG PO TABS
50.0000 ug | ORAL_TABLET | Freq: Every day | ORAL | Status: DC
  Administered 2015-09-08 – 2015-09-11 (×4): 50 ug via ORAL
  Filled 2015-09-07 (×5): qty 1

## 2015-09-07 MED ORDER — ATORVASTATIN CALCIUM 10 MG PO TABS
10.0000 mg | ORAL_TABLET | Freq: Every day | ORAL | Status: DC
  Administered 2015-09-08 – 2015-09-10 (×3): 10 mg via ORAL
  Filled 2015-09-07 (×4): qty 1

## 2015-09-07 MED ORDER — LISINOPRIL 5 MG PO TABS
5.0000 mg | ORAL_TABLET | Freq: Every day | ORAL | Status: DC
  Administered 2015-09-09 – 2015-09-11 (×3): 5 mg via ORAL
  Filled 2015-09-07 (×4): qty 1

## 2015-09-07 MED ORDER — ACETAMINOPHEN 325 MG PO TABS: 650.0000 mg | ORAL_TABLET | ORAL | Status: DC | PRN

## 2015-09-07 MED ORDER — ONDANSETRON 4 MG PO TBDP: 4.0000 mg | ORAL_TABLET | Freq: Three times a day (TID) | ORAL | Status: DC | PRN

## 2015-09-07 MED ORDER — FAMOTIDINE 20 MG PO TABS
20.0000 mg | ORAL_TABLET | Freq: Two times a day (BID) | ORAL | Status: DC
  Administered 2015-09-07 – 2015-09-11 (×7): 20 mg via ORAL
  Filled 2015-09-07 (×8): qty 1

## 2015-09-07 MED ORDER — METFORMIN HCL 500 MG PO TABS
1000.0000 mg | ORAL_TABLET | Freq: Two times a day (BID) | ORAL | Status: DC
  Administered 2015-09-08 – 2015-09-11 (×7): 1000 mg via ORAL
  Filled 2015-09-07 (×9): qty 2

## 2015-09-07 MED ORDER — DONEPEZIL HCL 5 MG PO TABS
5.0000 mg | ORAL_TABLET | Freq: Every day | ORAL | Status: DC
  Administered 2015-09-07 – 2015-09-10 (×4): 5 mg via ORAL
  Filled 2015-09-07 (×4): qty 1

## 2015-09-07 MED ORDER — ONDANSETRON HCL 4 MG PO TABS: 4.0000 mg | ORAL_TABLET | Freq: Three times a day (TID) | ORAL | Status: DC | PRN

## 2015-09-07 MED ORDER — IBUPROFEN 200 MG PO TABS: 600.0000 mg | ORAL_TABLET | Freq: Three times a day (TID) | ORAL | Status: DC | PRN

## 2015-09-07 MED ORDER — FUROSEMIDE 20 MG PO TABS
20.0000 mg | ORAL_TABLET | Freq: Every day | ORAL | Status: DC
  Administered 2015-09-08 – 2015-09-11 (×4): 20 mg via ORAL
  Filled 2015-09-07 (×4): qty 1

## 2015-09-07 MED ORDER — ASPIRIN EC 81 MG PO TBEC
81.0000 mg | DELAYED_RELEASE_TABLET | Freq: Every day | ORAL | Status: DC
  Administered 2015-09-08 – 2015-09-11 (×4): 81 mg via ORAL
  Filled 2015-09-07 (×4): qty 1

## 2015-09-07 MED ORDER — LORAZEPAM 1 MG PO TABS
1.0000 mg | ORAL_TABLET | Freq: Three times a day (TID) | ORAL | Status: DC | PRN
  Administered 2015-09-07 – 2015-09-10 (×5): 1 mg via ORAL
  Filled 2015-09-07 (×5): qty 1

## 2015-09-07 MED ORDER — CEPHALEXIN 500 MG PO CAPS
500.0000 mg | ORAL_CAPSULE | Freq: Once | ORAL | Status: AC
  Administered 2015-09-07: 500 mg via ORAL
  Filled 2015-09-07: qty 1

## 2015-09-07 MED ORDER — ACETAMINOPHEN 325 MG PO TABS
325.0000 mg | ORAL_TABLET | Freq: Four times a day (QID) | ORAL | Status: DC | PRN
  Administered 2015-09-09: 325 mg via ORAL
  Filled 2015-09-07: qty 1

## 2015-09-07 MED ORDER — POTASSIUM CHLORIDE CRYS ER 10 MEQ PO TBCR
10.0000 meq | EXTENDED_RELEASE_TABLET | Freq: Every day | ORAL | Status: DC
  Administered 2015-09-09 – 2015-09-11 (×3): 10 meq via ORAL
  Filled 2015-09-07 (×4): qty 1

## 2015-09-07 MED ORDER — LORATADINE 10 MG PO TABS
10.0000 mg | ORAL_TABLET | Freq: Every day | ORAL | Status: DC
  Administered 2015-09-08 – 2015-09-11 (×4): 10 mg via ORAL
  Filled 2015-09-07 (×4): qty 1

## 2015-09-07 MED ORDER — INSULIN GLARGINE 100 UNIT/ML ~~LOC~~ SOLN
30.0000 [IU] | Freq: Every day | SUBCUTANEOUS | Status: DC
  Administered 2015-09-07 – 2015-09-10 (×4): 30 [IU] via SUBCUTANEOUS
  Filled 2015-09-07 (×6): qty 0.3

## 2015-09-07 MED ORDER — FLUPHENAZINE HCL 10 MG PO TABS
10.0000 mg | ORAL_TABLET | Freq: Three times a day (TID) | ORAL | Status: DC
  Administered 2015-09-07 – 2015-09-11 (×11): 10 mg via ORAL
  Filled 2015-09-07 (×13): qty 1

## 2015-09-07 MED ORDER — ALBUTEROL SULFATE HFA 108 (90 BASE) MCG/ACT IN AERS: 2.0000 | INHALATION_SPRAY | Freq: Four times a day (QID) | RESPIRATORY_TRACT | Status: DC | PRN

## 2015-09-07 MED ORDER — BENZTROPINE MESYLATE 1 MG PO TABS
0.5000 mg | ORAL_TABLET | Freq: Every day | ORAL | Status: DC
  Administered 2015-09-07: 0.5 mg via ORAL
  Filled 2015-09-07: qty 1
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
## 2015-09-07 NOTE — ED Notes (Signed)
Pt given night time snack of 2 cheese sticks and diet Sprite.

## 2015-09-07 NOTE — ED Provider Notes (Signed)
CSN: [id number]     Arrival date & time 09/07/15  1425 History   First MD Initiated Contact with Patient 09/07/15 1553     Chief Complaint  Patient presents with  . IVC      (Consider location/radiation/quality/duration/timing/severity/associated sxs/prior Treatment) Patient is a 55 y.o. female presenting with mental health disorder.  Mental Health Problem Presenting symptoms: aggressive behavior, agitation and homicidal ideas   Presenting symptoms: no suicidal thoughts, no suicidal threats and no suicide attempt   Patient accompanied by:  Law enforcement Degree of incapacity (severity):  Moderate Timing:  Constant Chronicity:  New Context: not alcohol use   Treatment compliance:  Untreated Relieved by:  None tried Worsened by:  Nothing tried Ineffective treatments:  None tried Associated symptoms: no abdominal pain, no anxiety, no chest pain, no hypersomnia, no insomnia and no irritability     Past Medical History  Diagnosis Date  . Tardive dyskinesia   . COPD (chronic obstructive pulmonary disease) (HCC)   . Depression   . Hypertension   . Hypothyroidism   . CVA (cerebral infarction)   . Schizophrenia (HCC)    No past surgical history on file. Family History  Problem Relation Age of Onset  . Family history unknown: Yes   Social History  Substance Use Topics  . Smoking status: Current Every Day Smoker -- 1.00 packs/day    Types: Cigarettes  . Smokeless tobacco: None  . Alcohol Use: No   OB History    No data available     Review of Systems  Constitutional: Negative for fever, chills and irritability.  Eyes: Negative for pain.  Respiratory: Negative for cough and shortness of breath.   Cardiovascular: Negative for chest pain.  Gastrointestinal: Negative for abdominal pain.  Genitourinary: Negative for dysuria.  Psychiatric/Behavioral: Positive for homicidal ideas and agitation. Negative for suicidal ideas. The patient is not nervous/anxious and does not have  insomnia.   All other systems reviewed and are negative.     Allergies  Fish allergy  Home Medications   Prior to Admission medications   Medication Sig Start Date End Date Taking? Authorizing Provider  acetaminophen (TYLENOL) 325 MG tablet Take 325 mg by mouth every 6 (six) hours as needed for moderate pain.   Yes Historical Provider, MD  albuterol (PROVENTIL HFA;VENTOLIN HFA) 108 (90 Base) MCG/ACT inhaler Inhale 2 puffs into the lungs every 6 (six) hours as needed for wheezing or shortness of breath. 06/07/15  Yes Marily Memos, MD  aspirin EC 81 MG tablet Take 81 mg by mouth daily.   Yes Historical Provider, MD  atorvastatin (LIPITOR) 10 MG tablet Take 10 mg by mouth daily.   Yes Historical Provider, MD  benztropine (COGENTIN) 0.5 MG tablet Take 0.5 mg by mouth at bedtime.   Yes Historical Provider, MD  cetirizine (ZYRTEC) 10 MG tablet Take 1 tablet (10 mg total) by mouth daily. 11/11/14  Yes Beau Fanny, FNP  donepezil (ARICEPT) 5 MG tablet Take 5 mg by mouth at bedtime.   Yes Historical Provider, MD  famotidine (PEPCID) 20 MG tablet Take 1 tablet (20 mg total) by mouth 2 (two) times daily. 11/11/14  Yes Beau Fanny, FNP  fluPHENAZine (PROLIXIN) 10 MG tablet Take 1 tablet (10 mg total) by mouth at bedtime. Patient taking differently: Take 10 mg by mouth 3 (three) times daily.  11/11/14  Yes Beau Fanny, FNP  fluticasone (FLONASE) 50 MCG/ACT nasal spray Place 50 sprays into both nostrils 2 (two) times daily as  needed. Patient taking differently: Place 2 sprays into both nostrils at bedtime as needed for rhinitis (congestion.).  11/11/14  Yes Beau Fanny, FNP  furosemide (LASIX) 20 MG tablet Take 1 tablet (20 mg total) by mouth daily. 11/11/14  Yes Beau Fanny, FNP  insulin glargine (LANTUS) 100 UNIT/ML injection Inject 30 Units into the skin at bedtime.   Yes Historical Provider, MD  levothyroxine (SYNTHROID, LEVOTHROID) 50 MCG tablet Take 1 tablet (50 mcg total) by mouth daily.  11/11/14  Yes Beau Fanny, FNP  lisinopril (PRINIVIL,ZESTRIL) 5 MG tablet Take 5 mg by mouth daily.   Yes Historical Provider, MD  LORazepam (ATIVAN) 0.5 MG tablet Take 0.5 mg by mouth at bedtime.  09/09/14  Yes Historical Provider, MD  LORazepam (ATIVAN) 0.5 MG tablet Take 0.5 mg by mouth every 8 (eight) hours as needed for anxiety (/agitation.).   Yes Historical Provider, MD  metFORMIN (GLUCOPHAGE) 1000 MG tablet Take 1 tablet (1,000 mg total) by mouth 2 (two) times daily. 11/11/14  Yes Beau Fanny, FNP  mirtazapine (REMERON) 30 MG tablet Take 30 mg by mouth daily.   Yes Historical Provider, MD  Multiple Vitamins-Minerals (MULTIVITAMIN WITH MINERALS) tablet Take 1 tablet by mouth daily. 11/11/14  Yes Beau Fanny, FNP  ondansetron (ZOFRAN-ODT) 4 MG disintegrating tablet Take 4 mg by mouth 3 (three) times daily as needed for nausea or vomiting.   Yes Historical Provider, MD  potassium chloride (K-DUR,KLOR-CON) 10 MEQ tablet Take 1 tablet (10 mEq total) by mouth daily. 11/11/14  Yes Beau Fanny, FNP  tiotropium (SPIRIVA) 18 MCG inhalation capsule Place 18 mcg into inhaler and inhale daily.   Yes Historical Provider, MD   BP 101/64 mmHg  Pulse 78  Temp(Src) 97.9 F (36.6 C) (Oral)  Resp 16  SpO2 96% Physical Exam  Constitutional: She is oriented to person, place, and time. She appears well-developed and well-nourished.  HENT:  Head: Normocephalic and atraumatic.  Neck: Normal range of motion.  Cardiovascular: Normal rate and regular rhythm.  Exam reveals no friction rub.   No murmur heard. Pulmonary/Chest: No stridor. No respiratory distress.  Abdominal: She exhibits no distension.  Musculoskeletal: Normal range of motion. She exhibits no edema or tenderness.  Neurological: She is alert and oriented to person, place, and time. No cranial nerve deficit.  Psychiatric: Her mood appears anxious. Her affect is labile. She expresses no homicidal and no suicidal ideation. She expresses no  suicidal plans and no homicidal plans.  Nursing note and vitals reviewed.   ED Course  Procedures (including critical care time) Labs Review Labs Reviewed  COMPREHENSIVE METABOLIC PANEL - Abnormal; Notable for the following:    Glucose, Bld 157 (*)    All other components within normal limits  ACETAMINOPHEN LEVEL - Abnormal; Notable for the following:    Acetaminophen (Tylenol), Serum <10 (*)    All other components within normal limits  CBC - Abnormal; Notable for the following:    WBC 12.3 (*)    All other components within normal limits  URINALYSIS, ROUTINE W REFLEX MICROSCOPIC (NOT AT [facility]) - Abnormal; Notable for the following:    Nitrite POSITIVE (*)    Leukocytes, UA SMALL (*)    All other components within normal limits  URINE MICROSCOPIC-ADD ON - Abnormal; Notable for the following:    Squamous Epithelial / LPF 0-5 (*)    Bacteria, UA MANY (*)    All other components within normal limits  CBG MONITORING, ED -  Abnormal; Notable for the following:    Glucose-Capillary 199 (*)    All other components within normal limits  URINE CULTURE  ETHANOL  SALICYLATE LEVEL  URINE RAPID DRUG SCREEN, HOSP PERFORMED    Imaging Review No results found. I have personally reviewed and evaluated these images and lab results as part of my medical decision-making.   EKG Interpretation None      MDM   Final diagnoses:  Aggression  UTI (lower urinary tract infection)   Per facility, has had some hallucinations, increasing aggressive behavior. IVC'ed by them, will consult TTS.  Has UTI, started keflex, will need that to continue. Awaiting TTS consult and disposition.     Brystol Wasilewski MesnerMarily Memos, MD 09/08/15 [phone]

## 2015-09-07 NOTE — ED Notes (Signed)
Pt used waiting room bathroom before coming back to triage room. Unable for UA ATM

## 2015-09-07 NOTE — BH Assessment (Signed)
Per Carrie CottonJosephine, patient will remain in the ED overnight and will be re-assessed in the morning for a final disposition.
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
## 2015-09-08 DIAGNOSIS — R45851 Suicidal ideations: Secondary | ICD-10-CM | POA: Diagnosis not present

## 2015-09-08 DIAGNOSIS — F251 Schizoaffective disorder, depressive type: Secondary | ICD-10-CM

## 2015-09-08 DIAGNOSIS — F919 Conduct disorder, unspecified: Secondary | ICD-10-CM | POA: Diagnosis not present

## 2015-09-08 DIAGNOSIS — R45850 Homicidal ideations: Secondary | ICD-10-CM | POA: Diagnosis not present

## 2015-09-08 LAB — CBG MONITORING, ED
GLUCOSE-CAPILLARY: 136 mg/dL — AB (ref 65–99)
Glucose-Capillary: 59 mg/dL — ABNORMAL LOW (ref 65–99)
Glucose-Capillary: 120 mg/dL — ABNORMAL HIGH (ref 65–99)

## 2015-09-08 MED ORDER — BENZTROPINE MESYLATE 1 MG PO TABS
0.5000 mg | ORAL_TABLET | Freq: Two times a day (BID) | ORAL | Status: DC
  Administered 2015-09-08 – 2015-09-11 (×6): 0.5 mg via ORAL
  Filled 2015-09-08 (×6): qty 1

## 2015-09-08 MED ORDER — OXCARBAZEPINE 150 MG PO TABS
150.0000 mg | ORAL_TABLET | Freq: Two times a day (BID) | ORAL | Status: DC
Start: 2015-09-08 — End: 2015-09-10
  Administered 2015-09-08 – 2015-09-09 (×4): 150 mg via ORAL
  Filled 2015-09-08: qty 1
  Filled 2015-09-08 (×2): qty 0.5
  Filled 2015-09-08 (×2): qty 1

## 2015-09-08 MED ORDER — CEPHALEXIN 500 MG PO CAPS
500.0000 mg | ORAL_CAPSULE | Freq: Four times a day (QID) | ORAL | Status: DC
  Administered 2015-09-08 – 2015-09-11 (×14): 500 mg via ORAL
  Filled 2015-09-08 (×15): qty 1
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
## 2015-09-08 NOTE — ED Notes (Signed)
Night time snack of 2 cheese sticks and Sprite Zero given.  Pt requested Orange Juice also.

## 2015-09-08 NOTE — ED Notes (Signed)
Pt asked for adult diapers. She urinates in the diapers and has to be redirected to go to the bathroom and change them. She is capable of doing this, but does not do it. She is ambulatory. Her room smells like urine.

## 2015-09-08 NOTE — ED Notes (Signed)
Pt awake, alert & responsive, no distress noted, pt sleeping at present,  Monitoring for safety, Q 15 min checks in effect.

## 2015-09-08 NOTE — BHH Counselor (Signed)
This Clinical research associatewriter submitted supporting documentation to the following facilities for patient placement: [facility], [facility], [facility], [facility], [facility]    Leonila Speranza [place], [place]
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
## 2015-09-09 DIAGNOSIS — F919 Conduct disorder, unspecified: Secondary | ICD-10-CM | POA: Diagnosis not present

## 2015-09-09 LAB — CBG MONITORING, ED
GLUCOSE-CAPILLARY: 47 mg/dL — AB (ref 65–99)
GLUCOSE-CAPILLARY: 185 mg/dL — AB (ref 65–99)
Glucose-Capillary: 219 mg/dL — ABNORMAL HIGH (ref 65–99)
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
## 2015-09-09 NOTE — ED Notes (Signed)
Pt walking up and down hallway, yelling. Pt denies AVH. Reports "today is not my day" Will continue to monitor.

## 2015-09-09 NOTE — ED Notes (Signed)
Pt sitting up eating breakfast.

## 2015-09-09 NOTE — ED Notes (Signed)
Patient appears pleasant, cooperative. Denies SI, HI, AVH. Talkative and calm.  Encouragement offered.  Q15 safety checks in place.

## 2015-09-09 NOTE — ED Notes (Signed)
Patient restless, argumentative.

## 2015-09-10 DIAGNOSIS — F251 Schizoaffective disorder, depressive type: Secondary | ICD-10-CM | POA: Diagnosis not present

## 2015-09-10 DIAGNOSIS — F919 Conduct disorder, unspecified: Secondary | ICD-10-CM | POA: Diagnosis not present

## 2015-09-10 DIAGNOSIS — R4689 Other symptoms and signs involving appearance and behavior: Secondary | ICD-10-CM | POA: Diagnosis present

## 2015-09-10 DIAGNOSIS — F6089 Other specific personality disorders: Secondary | ICD-10-CM

## 2015-09-10 LAB — URINE CULTURE

## 2015-09-10 LAB — CBG MONITORING, ED
GLUCOSE-CAPILLARY: 142 mg/dL — AB (ref 65–99)
GLUCOSE-CAPILLARY: 208 mg/dL — AB (ref 65–99)

## 2015-09-10 MED ORDER — DIPHENHYDRAMINE HCL 50 MG/ML IJ SOLN
50.0000 mg | Freq: Once | INTRAMUSCULAR | Status: AC
  Administered 2015-09-11: 50 mg via INTRAMUSCULAR
  Filled 2015-09-10: qty 1

## 2015-09-10 MED ORDER — OXCARBAZEPINE 300 MG PO TABS
300.0000 mg | ORAL_TABLET | Freq: Two times a day (BID) | ORAL | Status: DC
  Administered 2015-09-10 – 2015-09-11 (×2): 300 mg via ORAL
  Filled 2015-09-10 (×2): qty 1

## 2015-09-10 MED ORDER — OLANZAPINE 10 MG PO TBDP
10.0000 mg | ORAL_TABLET | Freq: Three times a day (TID) | ORAL | Status: DC | PRN
Start: 2015-09-10 — End: 2015-09-11
  Administered 2015-09-10: 10 mg via ORAL
  Filled 2015-09-10 (×2): qty 1

## 2015-09-10 NOTE — ED Notes (Signed)
Pt awake, alert & responsive, no distress noted, calm & cooperative.  Monitoring for safety, Q 15 min checks in effect,no distress noted.

## 2015-09-10 NOTE — ED Notes (Signed)
Pt becoming more agitated, constantly coming to the Nurses Station.  Pt demanding to make a phone call and get out of [facility] at this time.  Redirected back to room, pt screaming & cursing at staff.

## 2015-09-10 NOTE — Consult Note (Signed)
Psychiatric Specialty Exam: Physical Exam  ROS  Blood pressure 104/55, pulse 79, temperature 97.4 F (36.3 C), temperature source Oral, resp. rate 18, SpO2 99 %.There is no height or weight on file to calculate BMI.  General Appearance: Disheveled  Eye Contact:: Minimal  Speech: Garbled and Slow  Volume: Decreased  Mood: Irritable  Affect: Labile  Thought Process: Disorganized  Orientation: Other: only to person  Thought Content: Delusions and Hallucinations: Auditory  Suicidal Thoughts:no  Homicidal Thoughts:no  Memory: Immediate; Fair Recent; Poor Remote; Poor  Judgement: Impaired  Insight: Lacking  Psychomotor Activity: Increased  Concentration: Fair  Recall: [place]  Fund of Knowledge:Fair  Language: Fair  Akathisia: No  Handed: Right  AIMS (if indicated):    Assets: Social Support  ADL's: Impaired  Cognition: Impaired, Mild  Sleep:            Patient yells out for her needs and also comes to the Nursing station.  Patient states she yells out if she is annoyed of something and when asked about what makes her angry she replied"everything makes me angry"   Patient is eating and drinking well and she is compliant with her medications.  She denies SI/HI/AVH.  We are still looking for inpatient Psychiatric hospitalization.  Schizoaffective disorder, depressive type (HCC)   Plan:  Seek placement at any facility with available bed, Offer medications as prescribed.  Dahlia ByesJosephine Onuoha   PMHNP-BC Patient seen face-to-face for psychiatric evaluation, chart reviewed and case discussed with the physician extender and developed treatment plan. Reviewed the information documented and agree with the treatment plan. Thedore MinsMojeed Quincey Nored, MD

## 2015-09-11 DIAGNOSIS — F919 Conduct disorder, unspecified: Secondary | ICD-10-CM | POA: Diagnosis not present

## 2015-09-11 DIAGNOSIS — F250 Schizoaffective disorder, bipolar type: Secondary | ICD-10-CM | POA: Diagnosis not present

## 2015-09-11 DIAGNOSIS — F6089 Other specific personality disorders: Secondary | ICD-10-CM | POA: Diagnosis not present

## 2015-09-11 MED ORDER — METFORMIN HCL 1000 MG PO TABS: 1000.0000 mg | ORAL_TABLET | Freq: Two times a day (BID) | ORAL | Status: DC

## 2015-09-11 MED ORDER — LORAZEPAM 1 MG PO TABS: 1.0000 mg | ORAL_TABLET | Freq: Three times a day (TID) | ORAL | Status: DC | PRN

## 2015-09-11 MED ORDER — CEPHALEXIN 500 MG PO CAPS: 500.0000 mg | ORAL_CAPSULE | Freq: Four times a day (QID) | ORAL | Status: DC

## 2015-09-11 MED ORDER — OXCARBAZEPINE 300 MG PO TABS: 300.0000 mg | ORAL_TABLET | Freq: Two times a day (BID) | ORAL | Status: DC

## 2015-09-11 NOTE — Consult Note (Signed)
[facility] Face-to-Face Psychiatry Consult   Reason for Consult:  Aggression Referring Physician:  EDP Patient Identification: Carrie Clark MRN:  [id number] Principal Diagnosis: Schizoaffective disorder, bipolar type [facility]) Diagnosis:   Patient Active Problem List   Diagnosis Date Noted  . Schizoaffective disorder, bipolar type (HCC) [F25.0] 09/11/2015    Priority: High  . Aggression [F60.89]     Priority: High  . Episode of behavior change [R68.89]   . Psychoses [F29]     Total Time spent with patient: 30 minutes  Subjective:   Carrie Clark is a 55 y.o. female patient does not warrant admission.  HPI:  55 yo female who presented to the ED from her assisted living facility, history of schizoaffective disorder, with suicidal ideations and agitation.  Denies suicidal/homicidal ideations, hallucinations.  She has been calm and cooperative with no aggression or agitation in 24 hours, stable for discharge.  Past Psychiatric History: schizoaffective disorder  Risk to Self: Suicidal Ideation: Yes-Currently Present Suicidal Intent: Yes-Currently Present Is patient at risk for suicide?: Yes Suicidal Plan?: No Access to Means: No What has been your use of drugs/alcohol within the last 12 months?: NA How many times?: 0 Other Self Harm Risks: NA Triggers for Past Attempts:  (NA) Intentional Self Injurious Behavior: None Risk to Others: Homicidal Ideation: No Thoughts of Harm to Others: No Current Homicidal Intent: No Current Homicidal Plan: No Access to Homicidal Means: No Identified Victim: NA History of harm to others?: No Assessment of Violence: None Noted Violent Behavior Description: N Does patient have access to weapons?: No Criminal Charges Pending?: No Does patient have a court date: No Prior Inpatient Therapy: Prior Inpatient Therapy: Yes Prior Therapy Dates: Unable to remember  Prior Therapy Facilty/Provider(s): Unable to remember Reason for Treatment: SI and Psychosis Prior  Outpatient Therapy: Prior Outpatient Therapy: Yes Prior Therapy Dates: Unable to remember Prior Therapy Facilty/Provider(s): Unable to remember Reason for Treatment: Unable to remember  Does patient have an ACCT team?: No Does patient have Intensive In-House Services?  : No Does patient have [facility] services? : No Does patient have P4CC services?: No  Past Medical History:  Past Medical History  Diagnosis Date  . Tardive dyskinesia   . COPD (chronic obstructive pulmonary disease) (HCC)   . Depression   . Hypertension   . Hypothyroidism   . CVA (cerebral infarction)   . Schizophrenia (HCC)    No past surgical history on file. Family History:  Family History  Problem Relation Age of Onset  . Family history unknown: Yes   Family Psychiatric  History: Unknown Social History:  History  Alcohol Use No     History  Drug Use No    Social History   Social History  . Marital Status: Unknown    Spouse Name: N/A  . Number of Children: N/A  . Years of Education: N/A   Social History Main Topics  . Smoking status: Current Every Day Smoker -- 1.00 packs/day    Types: Cigarettes  . Smokeless tobacco: None  . Alcohol Use: No  . Drug Use: No  . Sexual Activity: Not Asked   Other Topics Concern  . None   Social History Narrative   Additional Social History:    Allergies:   Allergies  Allergen Reactions  . Fish Allergy Other (See Comments)    Allergy reaction not listed- on MAR    Labs:  Results for orders placed or performed during the hospital encounter of 09/07/15 (from the past 48 hour(s))  CBG  monitoring, ED     Status: Abnormal   Collection Time: 09/09/15  8:09 PM  Result Value Ref Range   Glucose-Capillary 185 (H) 65 - 99 mg/dL  CBG monitoring, ED     Status: Abnormal   Collection Time: 09/10/15  7:26 AM  Result Value Ref Range   Glucose-Capillary 142 (H) 65 - 99 mg/dL  CBG monitoring, ED     Status: Abnormal   Collection Time: 09/10/15  8:28 PM  Result  Value Ref Range   Glucose-Capillary 208 (H) 65 - 99 mg/dL    Current Facility-Administered Medications  Medication Dose Route Frequency Provider Last Rate Last Dose  . acetaminophen (TYLENOL) tablet 325 mg  325 mg Oral Q6H PRN Marily Memos, MD   325 mg at 09/09/15 2218  . acetaminophen (TYLENOL) tablet 650 mg  650 mg Oral Q4H PRN Marily Memos, MD      . albuterol (PROVENTIL HFA;VENTOLIN HFA) 108 (90 Base) MCG/ACT inhaler 2 puff  2 puff Inhalation Q6H PRN Marily Memos, MD      . alum & mag hydroxide-simeth (MAALOX/MYLANTA) 200-200-20 MG/5ML suspension 30 mL  30 mL Oral PRN Marily Memos, MD      . aspirin EC tablet 81 mg  81 mg Oral Daily Marily Memos, MD   81 mg at 09/10/15 1059  . atorvastatin (LIPITOR) tablet 10 mg  10 mg Oral q1800 Marily Memos, MD   10 mg at 09/10/15 1853  . benztropine (COGENTIN) tablet 0.5 mg  0.5 mg Oral BID Thedore Mins, MD   0.5 mg at 09/10/15 2140  . cephALEXin (KEFLEX) capsule 500 mg  500 mg Oral 4 times per day Marily Memos, MD   500 mg at 09/11/15 1610  . donepezil (ARICEPT) tablet 5 mg  5 mg Oral QHS Marily Memos, MD   5 mg at 09/10/15 2141  . famotidine (PEPCID) tablet 20 mg  20 mg Oral BID Marily Memos, MD   20 mg at 09/10/15 2140  . fluPHENAZine (PROLIXIN) tablet 10 mg  10 mg Oral TID Marily Memos, MD   10 mg at 09/10/15 2140  . fluticasone (FLONASE) 50 MCG/ACT nasal spray 2 spray  2 spray Each Nare QHS PRN Marily Memos, MD      . furosemide (LASIX) tablet 20 mg  20 mg Oral Daily Marily Memos, MD   20 mg at 09/10/15 1100  . ibuprofen (ADVIL,MOTRIN) tablet 600 mg  600 mg Oral Q8H PRN Marily Memos, MD      . insulin glargine (LANTUS) injection 30 Units  30 Units Subcutaneous QHS Marily Memos, MD   30 Units at 09/10/15 2141  . levothyroxine (SYNTHROID, LEVOTHROID) tablet 50 mcg  50 mcg Oral QAC breakfast Marily Memos, MD   50 mcg at 09/11/15 0817  . lisinopril (PRINIVIL,ZESTRIL) tablet 5 mg  5 mg Oral Daily Marily Memos, MD   5 mg at 09/10/15 1101  . loratadine  (CLARITIN) tablet 10 mg  10 mg Oral Daily Marily Memos, MD   10 mg at 09/10/15 1059  . LORazepam (ATIVAN) tablet 1 mg  1 mg Oral Q8H PRN Marily Memos, MD   1 mg at 09/10/15 2140  . metFORMIN (GLUCOPHAGE) tablet 1,000 mg  1,000 mg Oral BID WC Marily Memos, MD   1,000 mg at 09/11/15 0817  . OLANZapine zydis (ZYPREXA) disintegrating tablet 10 mg  10 mg Oral Q8H PRN Thedore Mins, MD   10 mg at 09/10/15 2141  . ondansetron (ZOFRAN) tablet 4 mg  4 mg  Oral Q8H PRN Marily MemosJason Mesner, MD      . ondansetron (ZOFRAN-ODT) disintegrating tablet 4 mg  4 mg Oral TID PRN Marily MemosJason Mesner, MD      . Oxcarbazepine (TRILEPTAL) tablet 300 mg  300 mg Oral BID Thedore MinsMojeed Muna Demers, MD   300 mg at 09/10/15 2141  . potassium chloride (K-DUR,KLOR-CON) CR tablet 10 mEq  10 mEq Oral Daily Marily MemosJason Mesner, MD   10 mEq at 09/10/15 1104  . tiotropium (SPIRIVA) inhalation capsule 18 mcg  18 mcg Inhalation Daily Marily MemosJason Mesner, MD   18 mcg at 09/10/15 [id number]   Current Outpatient Prescriptions  Medication Sig Dispense Refill  . acetaminophen (TYLENOL) 325 MG tablet Take 325 mg by mouth every 6 (six) hours as needed for moderate pain.    Marland Kitchen. albuterol (PROVENTIL HFA;VENTOLIN HFA) 108 (90 Base) MCG/ACT inhaler Inhale 2 puffs into the lungs every 6 (six) hours as needed for wheezing or shortness of breath. 1 Inhaler 0  . aspirin EC 81 MG tablet Take 81 mg by mouth daily.    Marland Kitchen. atorvastatin (LIPITOR) 10 MG tablet Take 10 mg by mouth daily.    . benztropine (COGENTIN) 0.5 MG tablet Take 0.5 mg by mouth at bedtime.    . cetirizine (ZYRTEC) 10 MG tablet Take 1 tablet (10 mg total) by mouth daily.    Marland Kitchen. donepezil (ARICEPT) 5 MG tablet Take 5 mg by mouth at bedtime.    . famotidine (PEPCID) 20 MG tablet Take 1 tablet (20 mg total) by mouth 2 (two) times daily.    . fluPHENAZine (PROLIXIN) 10 MG tablet Take 1 tablet (10 mg total) by mouth at bedtime. (Patient taking differently: Take 10 mg by mouth 3 (three) times daily. )    . fluticasone (FLONASE) 50  MCG/ACT nasal spray Place 50 sprays into both nostrils 2 (two) times daily as needed. (Patient taking differently: Place 2 sprays into both nostrils at bedtime as needed for rhinitis (congestion.). )    . furosemide (LASIX) 20 MG tablet Take 1 tablet (20 mg total) by mouth daily. 30 tablet   . insulin glargine (LANTUS) 100 UNIT/ML injection Inject 30 Units into the skin at bedtime.    Marland Kitchen. levothyroxine (SYNTHROID, LEVOTHROID) 50 MCG tablet Take 1 tablet (50 mcg total) by mouth daily.    Marland Kitchen. lisinopril (PRINIVIL,ZESTRIL) 5 MG tablet Take 5 mg by mouth daily.    Marland Kitchen. LORazepam (ATIVAN) 0.5 MG tablet Take 0.5 mg by mouth at bedtime.     Marland Kitchen. LORazepam (ATIVAN) 0.5 MG tablet Take 0.5 mg by mouth every 8 (eight) hours as needed for anxiety Marland Kitchen(/agitation.).    Marland Kitchen. metFORMIN (GLUCOPHAGE) 1000 MG tablet Take 1 tablet (1,000 mg total) by mouth 2 (two) times daily.    . mirtazapine (REMERON) 30 MG tablet Take 30 mg by mouth daily.    . Multiple Vitamins-Minerals (MULTIVITAMIN WITH MINERALS) tablet Take 1 tablet by mouth daily.    . ondansetron (ZOFRAN-ODT) 4 MG disintegrating tablet Take 4 mg by mouth 3 (three) times daily as needed for nausea or vomiting.    . potassium chloride (K-DUR,KLOR-CON) 10 MEQ tablet Take 1 tablet (10 mEq total) by mouth daily.    Marland Kitchen. tiotropium (SPIRIVA) 18 MCG inhalation capsule Place 18 mcg into inhaler and inhale daily.    . [DISCONTINUED] losartan (COZAAR) 25 MG tablet Take 1 tablet (25 mg total) by mouth daily. (Patient not taking: Reported on 12/09/2014)      Musculoskeletal: Strength & Muscle Tone: within normal limits Gait &  Station: normal Patient leans: N/A  Psychiatric Specialty Exam: Review of Systems  Constitutional: Negative.   HENT: Negative.   Eyes: Negative.   Respiratory: Negative.   Cardiovascular: Negative.   Gastrointestinal: Negative.   Genitourinary: Negative.   Musculoskeletal: Negative.   Skin: Negative.   Neurological: Negative.   Endo/Heme/Allergies:  Negative.   Psychiatric/Behavioral: Negative.     Blood pressure 99/63, pulse 67, temperature 98.6 F (37 C), temperature source Oral, resp. rate 18, SpO2 98 %.There is no height or weight on file to calculate BMI.  General Appearance: Casual  Eye Contact::  Good  Speech:  Normal Rate  Volume:  Normal  Mood:  Euthymic  Affect:  Congruent  Thought Process:  Coherent  Orientation:  Full (Time, Place, and Person)  Thought Content:  WDL  Suicidal Thoughts:  No  Homicidal Thoughts:  No  Memory:  Immediate;   Good Recent;   Good Remote;   Good  Judgement:  Fair  Insight:  Fair  Psychomotor Activity:  Normal  Concentration:  Good  Recall:  Good  Fund of Knowledge:Good  Language: Good  Akathisia:  No  Handed:  Right  AIMS (if indicated):     Assets:  Housing Leisure Time Physical Health Resilience Social Support  ADL's:  Intact  Cognition: WNL  Sleep:      Treatment Plan Summary: Daily contact with patient to assess and evaluate symptoms and progress in treatment, Medication management and Plan schizoaffective disorder, bipolar type:  -Crisis stabilization -Medication management:  Continue Cogentin 0.5 mg BID for EPS, Aricept 5 mg at bedtime for memory issues, Prolixin 10 mg TID for psychosis, Ativan 1 mg TID PRN agitation/anxiety, Trileptal 300 mg BID mood stabilization, and Zyprexa 10 mg every 8 hours PRN agitation. -Individual counseling  Disposition: No evidence of imminent risk to self or others at present.    Nanine Means, NP 09/11/2015 10:21 AM Patient seen face-to-face for psychiatric evaluation, chart reviewed and case discussed with the physician extender and developed treatment plan. Reviewed the information documented and agree with the treatment plan. Thedore Mins, MD

## 2015-09-11 NOTE — ED Notes (Signed)
[facility]  Left the unit on a stretcher with [facility].  All belongings, discharge summary and prescriptions given to the transport team.

## 2015-09-11 NOTE — ED Notes (Signed)
Patient incontinent of urine.  Bed cleaned and sheets changed.  Patient put on clean scrubs, but did not shower.  Was angrily asking for cereal and became upset when told we didn't have any.

## 2015-09-11 NOTE — [place] FL2 (Signed)
[place] MEDICAID FL2 LEVEL OF CARE SCREENING TOOL     IDENTIFICATION  Patient Name: Carrie Clark Birthdate: 12/07/1960 Sex: female Admission Date (Current Location): 09/07/2015  [facility] and [place] Number:  Producer, television/film/video and Address:  [employer],  [address] [place] [address]      Provider Number: [id number]  Attending Physician Name and Address:  Provider Default, MD  Relative Name and Phone Number:       Current Level of Care: Hospital Recommended Level of Care: Assisted Living Facility Prior Approval Number:    Date Approved/Denied:   PASRR Number:    Discharge Plan: Other (Comment) (Assisted Living Facility)    Current Diagnoses: Patient Active Problem List   Diagnosis Date Noted  . Schizoaffective disorder, bipolar type (HCC) 09/11/2015  . Aggression   . Episode of behavior change   . Psychoses     Orientation RESPIRATION BLADDER Height & Weight     Self, Time, Situation, Place  Normal Continent (Per Nurse, patient being treated for a UTI) Weight:   Height:     BEHAVIORAL SYMPTOMS/MOOD NEUROLOGICAL BOWEL NUTRITION STATUS      Continent Diet  AMBULATORY STATUS COMMUNICATION OF NEEDS Skin   Independent Verbally Normal                       Personal Care Assistance Level of Assistance  Feeding, Bathing Bathing Assistance: Independent (Per Nurse, needed verbal cueing) Feeding assistance: Independent       Functional Limitations Info  Sight, Hearing, Speech Sight Info: Adequate Hearing Info: Adequate Speech Info: Adequate    SPECIAL CARE FACTORS FREQUENCY   (Per Nurse, patient taking medication for blood pressure)                    Contractures      Additional Factors Info  Allergies (Fish Allergy)   Allergies Info: Fish Allergy           Current Medications (09/11/2015):  This is the current hospital active medication list Current Facility-Administered Medications  Medication Dose Route Frequency  Provider Last Rate Last Dose  . acetaminophen (TYLENOL) tablet 325 mg  325 mg Oral Q6H PRN Marily Memos, MD   325 mg at 09/09/15 2218  . acetaminophen (TYLENOL) tablet 650 mg  650 mg Oral Q4H PRN Marily Memos, MD      . albuterol (PROVENTIL HFA;VENTOLIN HFA) 108 (90 Base) MCG/ACT inhaler 2 puff  2 puff Inhalation Q6H PRN Marily Memos, MD      . alum & mag hydroxide-simeth (MAALOX/MYLANTA) 200-200-20 MG/5ML suspension 30 mL  30 mL Oral PRN Marily Memos, MD      . aspirin EC tablet 81 mg  81 mg Oral Daily Marily Memos, MD   81 mg at 09/11/15 1044  . atorvastatin (LIPITOR) tablet 10 mg  10 mg Oral q1800 Marily Memos, MD   10 mg at 09/10/15 1853  . benztropine (COGENTIN) tablet 0.5 mg  0.5 mg Oral BID Kennedy Bohanon, MD   0.5 mg at 09/11/15 1045  . cephALEXin (KEFLEX) capsule 500 mg  500 mg Oral 4 times per day Marily Memos, MD   500 mg at 09/11/15 8119  . donepezil (ARICEPT) tablet 5 mg  5 mg Oral QHS Marily Memos, MD   5 mg at 09/10/15 2141  . famotidine (PEPCID) tablet 20 mg  20 mg Oral BID Marily Memos, MD   20 mg at 09/11/15 1045  . fluPHENAZine (PROLIXIN)  tablet 10 mg  10 mg Oral TID Marily MemosJason Mesner, MD   10 mg at 09/11/15 1045  . fluticasone (FLONASE) 50 MCG/ACT nasal spray 2 spray  2 spray Each Nare QHS PRN Marily MemosJason Mesner, MD      . furosemide (LASIX) tablet 20 mg  20 mg Oral Daily Marily MemosJason Mesner, MD   20 mg at 09/11/15 1045  . ibuprofen (ADVIL,MOTRIN) tablet 600 mg  600 mg Oral Q8H PRN Marily MemosJason Mesner, MD      . insulin glargine (LANTUS) injection 30 Units  30 Units Subcutaneous QHS Marily MemosJason Mesner, MD   30 Units at 09/10/15 2141  . levothyroxine (SYNTHROID, LEVOTHROID) tablet 50 mcg  50 mcg Oral QAC breakfast Marily MemosJason Mesner, MD   50 mcg at 09/11/15 0817  . lisinopril (PRINIVIL,ZESTRIL) tablet 5 mg  5 mg Oral Daily Marily MemosJason Mesner, MD   5 mg at 09/11/15 1045  . loratadine (CLARITIN) tablet 10 mg  10 mg Oral Daily Marily MemosJason Mesner, MD   10 mg at 09/11/15 1045  . LORazepam (ATIVAN) tablet 1 mg  1 mg Oral Q8H PRN  Marily MemosJason Mesner, MD   1 mg at 09/10/15 2140  . metFORMIN (GLUCOPHAGE) tablet 1,000 mg  1,000 mg Oral BID WC Marily MemosJason Mesner, MD   1,000 mg at 09/11/15 0817  . OLANZapine zydis (ZYPREXA) disintegrating tablet 10 mg  10 mg Oral Q8H PRN Thedore MinsMojeed Holiday Mcmenamin, MD   10 mg at 09/10/15 2141  . ondansetron (ZOFRAN) tablet 4 mg  4 mg Oral Q8H PRN Marily MemosJason Mesner, MD      . ondansetron (ZOFRAN-ODT) disintegrating tablet 4 mg  4 mg Oral TID PRN Marily MemosJason Mesner, MD      . Oxcarbazepine (TRILEPTAL) tablet 300 mg  300 mg Oral BID Thedore MinsMojeed Ligaya Cormier, MD   300 mg at 09/11/15 1045  . potassium chloride (K-DUR,KLOR-CON) CR tablet 10 mEq  10 mEq Oral Daily Marily MemosJason Mesner, MD   10 mEq at 09/11/15 1045  . tiotropium (SPIRIVA) inhalation capsule 18 mcg  18 mcg Inhalation Daily Marily MemosJason Mesner, MD   18 mcg at 09/10/15 [id number]   Current Outpatient Prescriptions  Medication Sig Dispense Refill  . acetaminophen (TYLENOL) 325 MG tablet Take 325 mg by mouth every 6 (six) hours as needed for moderate pain.    Marland Kitchen. albuterol (PROVENTIL HFA;VENTOLIN HFA) 108 (90 Base) MCG/ACT inhaler Inhale 2 puffs into the lungs every 6 (six) hours as needed for wheezing or shortness of breath. 1 Inhaler 0  . aspirin EC 81 MG tablet Take 81 mg by mouth daily.    Marland Kitchen. atorvastatin (LIPITOR) 10 MG tablet Take 10 mg by mouth daily.    . benztropine (COGENTIN) 0.5 MG tablet Take 0.5 mg by mouth at bedtime.    . cetirizine (ZYRTEC) 10 MG tablet Take 1 tablet (10 mg total) by mouth daily.    Marland Kitchen. donepezil (ARICEPT) 5 MG tablet Take 5 mg by mouth at bedtime.    . famotidine (PEPCID) 20 MG tablet Take 1 tablet (20 mg total) by mouth 2 (two) times daily.    . fluPHENAZine (PROLIXIN) 10 MG tablet Take 1 tablet (10 mg total) by mouth at bedtime. (Patient taking differently: Take 10 mg by mouth 3 (three) times daily. )    . fluticasone (FLONASE) 50 MCG/ACT nasal spray Place 50 sprays into both nostrils 2 (two) times daily as needed. (Patient taking differently: Place 2 sprays into both  nostrils at bedtime as needed for rhinitis (congestion.). )    . furosemide (LASIX) 20  MG tablet Take 1 tablet (20 mg total) by mouth daily. 30 tablet   . insulin glargine (LANTUS) 100 UNIT/ML injection Inject 30 Units into the skin at bedtime.    Marland Kitchen levothyroxine (SYNTHROID, LEVOTHROID) 50 MCG tablet Take 1 tablet (50 mcg total) by mouth daily.    Marland Kitchen lisinopril (PRINIVIL,ZESTRIL) 5 MG tablet Take 5 mg by mouth daily.    Marland Kitchen LORazepam (ATIVAN) 0.5 MG tablet Take 0.5 mg by mouth at bedtime.     Marland Kitchen LORazepam (ATIVAN) 0.5 MG tablet Take 0.5 mg by mouth every 8 (eight) hours as needed for anxiety Marland Kitchen/agitation.).    Marland Kitchen mirtazapine (REMERON) 30 MG tablet Take 30 mg by mouth daily.    . Multiple Vitamins-Minerals (MULTIVITAMIN WITH MINERALS) tablet Take 1 tablet by mouth daily.    . ondansetron (ZOFRAN-ODT) 4 MG disintegrating tablet Take 4 mg by mouth 3 (three) times daily as needed for nausea or vomiting.    . potassium chloride (K-DUR,KLOR-CON) 10 MEQ tablet Take 1 tablet (10 mEq total) by mouth daily.    Marland Kitchen tiotropium (SPIRIVA) 18 MCG inhalation capsule Place 18 mcg into inhaler and inhale daily.    . cephALEXin (KEFLEX) 500 MG capsule Take 1 capsule (500 mg total) by mouth every 6 (six) hours. 10 capsule 0  . LORazepam (ATIVAN) 1 MG tablet Take 1 tablet (1 mg total) by mouth every 8 (eight) hours as needed for anxiety (agitation). 30 tablet 0  . metFORMIN (GLUCOPHAGE) 1000 MG tablet Take 1 tablet (1,000 mg total) by mouth 2 (two) times daily.    . Oxcarbazepine (TRILEPTAL) 300 MG tablet Take 1 tablet (300 mg total) by mouth 2 (two) times daily. 60 tablet 0  . [DISCONTINUED] losartan (COZAAR) 25 MG tablet Take 1 tablet (25 mg total) by mouth daily. (Patient not taking: Reported on 12/09/2014)       Discharge Medications: Please see discharge summary for a list of discharge medications.  Relevant Imaging Results:  Relevant Lab Results:   Additional Information    Claudean Severance,  [place]    Thedore Mins, MD 09/11/2015 11:38 AM

## 2015-09-11 NOTE — Progress Notes (Addendum)
CSW called [facility] to speak with the Admissions Director, as patient has been psychiatrically discharged. CSW spoke with Myrle ShengShontay Gardner at [facility] who states they will need a FL2 signed by the physician for review before patient could return. She stated Judeth Cornfieldebecca White, Resident Coordinator would be the person that would review the FL2. CSW informed Myrle ShengShontay Gardner that patient had been cleared for discharge and was ready to return to their facility. [facility]. Fax# [phone]  CSW spoke with Judeth Cornfieldebecca White, Care Coordinator who states she will call CSW back [facility]. CSW informed Care Coordinator that patient had been cleared and is ready for discharge.   Elenore PaddyLaVonia Doryce Mcgregory, LCSWA [phone] ED CSW 09/11/2015 11:10 AM

## 2015-09-11 NOTE — [facility] Suicide Risk Assessment (Signed)
Suicide Risk Assessment  Discharge Assessment   [facility]   Principal Problem: Schizoaffective disorder, bipolar type [facility](HCC) Discharge Diagnoses:  Patient Active Problem List   Diagnosis Date Noted  . Schizoaffective disorder, bipolar type (HCC) [F25.0] 09/11/2015    Priority: High  . Aggression [F60.89]     Priority: High  . Episode of behavior change [R68.89]   . Psychoses [F29]     Total Time spent with patient: 30 minutes   Musculoskeletal: Strength & Muscle Tone: within normal limits Gait & Station: normal Patient leans: N/A  Psychiatric Specialty Exam: Review of Systems  Constitutional: Negative.   HENT: Negative.   Eyes: Negative.   Respiratory: Negative.   Cardiovascular: Negative.   Gastrointestinal: Negative.   Genitourinary: Negative.   Musculoskeletal: Negative.   Skin: Negative.   Neurological: Negative.   Endo/Heme/Allergies: Negative.   Psychiatric/Behavioral: Negative.     Blood pressure 99/63, pulse 67, temperature 98.6 F (37 C), temperature source Oral, resp. rate 18, SpO2 98 %.There is no height or weight on file to calculate BMI.  General Appearance: Casual  Eye Contact::  Good  Speech:  Normal Rate  Volume:  Normal  Mood:  Euthymic  Affect:  Congruent  Thought Process:  Coherent  Orientation:  Full (Time, Place, and Person)  Thought Content:  WDL  Suicidal Thoughts:  No  Homicidal Thoughts:  No  Memory:  Immediate;   Good Recent;   Good Remote;   Good  Judgement:  Fair  Insight:  Fair  Psychomotor Activity:  Normal  Concentration:  Good  Recall:  Good  Fund of Knowledge:Good  Language: Good  Akathisia:  No  Handed:  Right  AIMS (if indicated):     Assets:  Housing Leisure Time Physical Health Resilience Social Support  ADL's:  Intact  Cognition: WNL  Sleep:      Mental Status Per Nursing Assessment::   On Admission:   suicidal ideations, agitation  Demographic Factors:  NA  Loss  Factors: NA  Historical Factors: Impulsivity  Risk Reduction Factors:   Living with another person, especially a relative, Positive social support and Positive therapeutic relationship  Continued Clinical Symptoms:  None  Cognitive Features That Contribute To Risk:  None    Suicide Risk:  Minimal: No identifiable suicidal ideation.  Patients presenting with no risk factors but with morbid ruminations; may be classified as minimal risk based on the severity of the depressive symptoms    Plan Of Care/Follow-up recommendations:  Activity:  as tolerated Diet:  regular  Twilla Khouri, NP 09/11/2015, 10:30 AM

## 2015-09-11 NOTE — Progress Notes (Addendum)
CSW spoke with Judeth Cornfieldebecca White, Care Coordinator at [facility], [phone]. [facility] their [facility]. CSW informed NP and Psychiatry Team.  CSW spoke with Judeth Cornfieldebecca White, Care Coordinator to see if patient would be able to be transported by their agency. She stated the person that transports was out on today. Nurse was informed of this information and she stated to consult with the NP regarding transport. NP stated patient could be transported by taxi. CSW consulted Asst. Social Work Interior and spatial designerDirector to see if patient would be able to be transported by taxi. He stated [facility]. CSW informed NP, Nurse, and Psychiatry Team. CSW provided Nurse with the name and contact number for the Care Coordinator at [facility].   Elenore PaddyLaVonia Danitza Schoenfeldt, LCSWA [phone] ED CSW 09/11/2015 12:41 PM

## 2015-10-10 ENCOUNTER — Encounter (HOSPITAL_COMMUNITY): Payer: Self-pay | Admitting: Emergency Medicine

## 2015-10-10 ENCOUNTER — Emergency Department (HOSPITAL_COMMUNITY)
Admission: EM | Admit: 2015-10-10 | Discharge: 2015-10-10 | Disposition: A | Discharge status: Home / Self Care | Payer: Medicare Other | Attending: Emergency Medicine | Admitting: Emergency Medicine

## 2015-10-10 ENCOUNTER — Emergency Department (HOSPITAL_COMMUNITY): Payer: Medicare Other

## 2015-10-10 DIAGNOSIS — E162 Hypoglycemia, unspecified: Secondary | ICD-10-CM

## 2015-10-10 DIAGNOSIS — R410 Disorientation, unspecified: Secondary | ICD-10-CM | POA: Insufficient documentation

## 2015-10-10 DIAGNOSIS — J449 Chronic obstructive pulmonary disease, unspecified: Secondary | ICD-10-CM | POA: Diagnosis not present

## 2015-10-10 DIAGNOSIS — F17210 Nicotine dependence, cigarettes, uncomplicated: Secondary | ICD-10-CM | POA: Insufficient documentation

## 2015-10-10 DIAGNOSIS — Z7984 Long term (current) use of oral hypoglycemic drugs: Secondary | ICD-10-CM | POA: Insufficient documentation

## 2015-10-10 DIAGNOSIS — Z792 Long term (current) use of antibiotics: Secondary | ICD-10-CM | POA: Insufficient documentation

## 2015-10-10 DIAGNOSIS — F329 Major depressive disorder, single episode, unspecified: Secondary | ICD-10-CM | POA: Insufficient documentation

## 2015-10-10 DIAGNOSIS — E871 Hypo-osmolality and hyponatremia: Secondary | ICD-10-CM | POA: Insufficient documentation

## 2015-10-10 DIAGNOSIS — Z7982 Long term (current) use of aspirin: Secondary | ICD-10-CM | POA: Diagnosis not present

## 2015-10-10 DIAGNOSIS — I10 Essential (primary) hypertension: Secondary | ICD-10-CM | POA: Diagnosis not present

## 2015-10-10 DIAGNOSIS — Z794 Long term (current) use of insulin: Secondary | ICD-10-CM | POA: Diagnosis not present

## 2015-10-10 DIAGNOSIS — Z79899 Other long term (current) drug therapy: Secondary | ICD-10-CM | POA: Diagnosis not present

## 2015-10-10 DIAGNOSIS — Z7951 Long term (current) use of inhaled steroids: Secondary | ICD-10-CM | POA: Insufficient documentation

## 2015-10-10 DIAGNOSIS — Y92129 Unspecified place in nursing home as the place of occurrence of the external cause: Secondary | ICD-10-CM

## 2015-10-10 DIAGNOSIS — Z8673 Personal history of transient ischemic attack (TIA), and cerebral infarction without residual deficits: Secondary | ICD-10-CM | POA: Diagnosis not present

## 2015-10-10 DIAGNOSIS — F209 Schizophrenia, unspecified: Secondary | ICD-10-CM | POA: Insufficient documentation

## 2015-10-10 DIAGNOSIS — E039 Hypothyroidism, unspecified: Secondary | ICD-10-CM | POA: Diagnosis not present

## 2015-10-10 DIAGNOSIS — W19XXXA Unspecified fall, initial encounter: Secondary | ICD-10-CM

## 2015-10-10 HISTORY — DX: Cerebral infarction, unspecified: I63.9

## 2015-10-10 LAB — CBC WITH DIFFERENTIAL/PLATELET
Basophils Absolute: 0.1 10*3/uL (ref 0.0–0.1)
Basophils Relative: 1 %
EOS PCT: 2 %
Eosinophils Absolute: 0.2 10*3/uL (ref 0.0–0.7)
HCT: 38 % (ref 36.0–46.0)
Hemoglobin: 12.6 g/dL (ref 12.0–15.0)
LYMPHS ABS: 2.8 10*3/uL (ref 0.7–4.0)
LYMPHS PCT: 27 %
MCH: 30.4 pg (ref 26.0–34.0)
MCHC: 33.2 g/dL (ref 30.0–36.0)
MCV: 91.6 fL (ref 78.0–100.0)
MONO ABS: 0.7 10*3/uL (ref 0.1–1.0)
Monocytes Relative: 6 %
Neutro Abs: 6.9 10*3/uL (ref 1.7–7.7)
Neutrophils Relative %: 64 %
PLATELETS: 280 10*3/uL (ref 150–400)
RBC: 4.15 MIL/uL (ref 3.87–5.11)
RDW: 13.2 % (ref 11.5–15.5)
WBC: 10.5 10*3/uL (ref 4.0–10.5)

## 2015-10-10 LAB — COMPREHENSIVE METABOLIC PANEL
ALBUMIN: 4.3 g/dL (ref 3.5–5.0)
ALT: 17 U/L (ref 14–54)
AST: 21 U/L (ref 15–41)
Alkaline Phosphatase: 74 U/L (ref 38–126)
Anion gap: 12 (ref 5–15)
BUN: 19 mg/dL (ref 6–20)
CHLORIDE: 93 mmol/L — AB (ref 101–111)
CO2: 22 mmol/L (ref 22–32)
CREATININE: 0.59 mg/dL (ref 0.44–1.00)
Calcium: 9.3 mg/dL (ref 8.9–10.3)
GFR calc Af Amer: >60 mL/min (ref >60)
GFR calc non Af Amer: >60 mL/min (ref >60)
GLUCOSE: 127 mg/dL — AB (ref 65–99)
POTASSIUM: 4.3 mmol/L (ref 3.5–5.1)
Sodium: 127 mmol/L — ABNORMAL LOW (ref 135–145)
Total Bilirubin: 0.5 mg/dL (ref 0.3–1.2)
Total Protein: 6.6 g/dL (ref 6.5–8.1)

## 2015-10-10 LAB — URINALYSIS, ROUTINE W REFLEX MICROSCOPIC
Bilirubin Urine: NEGATIVE
GLUCOSE, UA: NEGATIVE mg/dL
Hgb urine dipstick: NEGATIVE
KETONES UR: NEGATIVE mg/dL
NITRITE: NEGATIVE
PH: 5 (ref 5.0–8.0)
PROTEIN: NEGATIVE mg/dL
Specific Gravity, Urine: 1.011 (ref 1.005–1.030)

## 2015-10-10 LAB — URINE MICROSCOPIC-ADD ON

## 2015-10-10 LAB — CBG MONITORING, ED
GLUCOSE-CAPILLARY: 179 mg/dL — AB (ref 65–99)
GLUCOSE-CAPILLARY: 131 mg/dL — AB (ref 65–99)
Glucose-Capillary: 92 mg/dL (ref 65–99)

## 2015-10-10 NOTE — ED Notes (Addendum)
CSW reports that they called [facility] and the Pt is clear to return.

## 2015-10-10 NOTE — ED Notes (Signed)
Patient received a lunch tray.

## 2015-10-10 NOTE — ED Notes (Signed)
Paper work and report given to [facility].   

## 2015-10-10 NOTE — ED Notes (Signed)
[facility] with [facility]/o [facility]  Pt states she fell three times yesterday  Pt denies any LOC or pain from the fall  EMS  Facility states she has not been acting herself, has been crying, and disoriented  EMS states the initial CBG was 34 so they gave 30gm of oral glucose  Rechecked it and it went up to 83 then rechecked again when they arrived and it was 67  Pt states [facility] is not [facility] because they are mean to her and have not let her have food since lunch time yesterday

## 2015-10-10 NOTE — ED Notes (Signed)
Report given to Alaina, RN

## 2015-10-10 NOTE — ED Notes (Signed)
Pt transported to CT ?

## 2015-10-10 NOTE — ED Notes (Signed)
Dr. Preston FleetingGlick at bedside. Aware most recent CBG 92. Pt provided with orange juice and sandwich at this time.

## 2015-10-10 NOTE — ED Provider Notes (Signed)
I received this patient in signout from Dr. Preston FleetingGlick. She had presented with hypoglycemia and had received workup including lab work and head CT which was reassuring. We were awaiting social work consultation because the patient requested not to return to her current living facility. Social work evaluated the patient and discussed with Merchandiser, retailsupervisor. Ultimately, recommended to return to current facility. We have no concerns for neglect based on the patient's reassuring exam. He shouldn't has requested multiple times for food and has had a sandwich and lunch tray. Discharged in satisfactory condition.  Laurence Spatesachel Morgan Ritvik Mczeal, MD 10/10/15 [phone]

## 2015-10-10 NOTE — ED Provider Notes (Signed)
CSN: [id number]     Arrival date & time 10/10/15  [id number] History   None    Chief Complaint  Patient presents with  . Hypoglycemia     (Consider location/radiation/quality/duration/timing/severity/associated sxs/prior Treatment) Patient is a [age] y.o. female presenting with hypoglycemia. The history is provided by the patient, the nursing home and the EMS personnel.  Hypoglycemia [facility] sugar. She apparently had fallen several times yesterday and the facility had stated that she had been disoriented. EMS noted blood sugar of 34 and gave her oral glucose with improvement of blood sugar to 83. Patient states that she did hit her head when she fell. She is also stating that she does not wish to go back to the facility.  Past Medical History  Diagnosis Date  . Tardive dyskinesia   . COPD (chronic obstructive pulmonary disease) (HCC)   . Depression   . Hypertension   . Hypothyroidism   . CVA (cerebral infarction)   . Schizophrenia (HCC)   . Stroke [facility](HCC)    History reviewed. No pertinent past surgical history. Family History  Problem Relation Age of Onset  . Family history unknown: Yes   Social History  Substance Use Topics  . Smoking status: Current Every Day Smoker -- 1.00 packs/day    Types: Cigarettes  . Smokeless tobacco: None  . Alcohol Use: No   OB History    No data available     Review of Systems  All other systems reviewed and are negative.     Allergies  Fish allergy  Home Medications   Prior to Admission medications   Medication Sig Start Date End Date Taking? Authorizing Provider  acetaminophen (TYLENOL) 325 MG tablet Take 325 mg by mouth every 6 (six) hours as needed for moderate pain.    Historical Provider, MD  albuterol (PROVENTIL HFA;VENTOLIN HFA) 108 (90 Base) MCG/ACT inhaler Inhale 2 puffs into the lungs every 6 (six) hours as needed for wheezing or shortness of breath. 06/07/15   Marily MemosJason Mesner, MD   aspirin EC 81 MG tablet Take 81 mg by mouth daily.    Historical Provider, MD  atorvastatin (LIPITOR) 10 MG tablet Take 10 mg by mouth daily.    Historical Provider, MD  benztropine (COGENTIN) 0.5 MG tablet Take 0.5 mg by mouth at bedtime.    Historical Provider, MD  cephALEXin (KEFLEX) 500 MG capsule Take 1 capsule (500 mg total) by mouth every 6 (six) hours. 09/11/15   Charm RingsJamison Y Lord, NP  cetirizine (ZYRTEC) 10 MG tablet Take 1 tablet (10 mg total) by mouth daily. 11/11/14   Beau FannyJohn C Withrow, FNP  donepezil (ARICEPT) 5 MG tablet Take 5 mg by mouth at bedtime.    Historical Provider, MD  famotidine (PEPCID) 20 MG tablet Take 1 tablet (20 mg total) by mouth 2 (two) times daily. 11/11/14   Beau FannyJohn C Withrow, FNP  fluPHENAZine (PROLIXIN) 10 MG tablet Take 1 tablet (10 mg total) by mouth at bedtime. Patient taking differently: Take 10 mg by mouth 3 (three) times daily.  11/11/14   Beau FannyJohn C Withrow, FNP  fluticasone (FLONASE) 50 MCG/ACT nasal spray Place 50 sprays into both nostrils 2 (two) times daily as needed. Patient taking differently: Place 2 sprays into both nostrils at bedtime as needed for rhinitis (congestion.).  11/11/14   Beau FannyJohn C Withrow, FNP  furosemide (LASIX) 20 MG tablet Take 1 tablet (20 mg total) by mouth daily. 11/11/14   Beau FannyJohn C Withrow,  FNP  insulin glargine (LANTUS) 100 UNIT/ML injection Inject 30 Units into the skin at bedtime.    Historical Provider, MD  levothyroxine (SYNTHROID, LEVOTHROID) 50 MCG tablet Take 1 tablet (50 mcg total) by mouth daily. 11/11/14   Beau Fanny, FNP  lisinopril (PRINIVIL,ZESTRIL) 5 MG tablet Take 5 mg by mouth daily.    Historical Provider, MD  LORazepam (ATIVAN) 1 MG tablet Take 1 tablet (1 mg total) by mouth every 8 (eight) hours as needed for anxiety (agitation). 09/11/15   Charm Rings, NP  metFORMIN (GLUCOPHAGE) 1000 MG tablet Take 1 tablet (1,000 mg total) by mouth 2 (two) times daily. 09/11/15   Charm Rings, NP  mirtazapine (REMERON) 30 MG tablet Take  30 mg by mouth daily.    Historical Provider, MD  Multiple Vitamins-Minerals (MULTIVITAMIN WITH MINERALS) tablet Take 1 tablet by mouth daily. 11/11/14   Beau Fanny, FNP  ondansetron (ZOFRAN-ODT) 4 MG disintegrating tablet Take 4 mg by mouth 3 (three) times daily as needed for nausea or vomiting.    Historical Provider, MD  Oxcarbazepine (TRILEPTAL) 300 MG tablet Take 1 tablet (300 mg total) by mouth 2 (two) times daily. 09/11/15   Charm Rings, NP  potassium chloride (K-DUR,KLOR-CON) 10 MEQ tablet Take 1 tablet (10 mEq total) by mouth daily. 11/11/14   Beau Fanny, FNP  tiotropium (SPIRIVA) 18 MCG inhalation capsule Place 18 mcg into inhaler and inhale daily.    Historical Provider, MD   BP 117/56 mmHg  Pulse 79  Temp(Src) 97.7 F (36.5 C) (Oral)  Resp 16  SpO2 95% Physical Exam  Nursing note and vitals reviewed.  54 year old female, resting comfortably and in no acute distress. Vital signs are normal. Oxygen saturation is 95%, which is normal. Head is normocephalic and atraumatic. PERRLA, EOMI. Oropharynx is clear. Neck is nontender without adenopathy or JVD. Back is nontender and there is no CVA tenderness. Lungs are clear without rales, wheezes, or rhonchi. Chest is nontender. Heart has regular rate and rhythm without murmur. Abdomen is soft, flat, nontender without masses or hepatosplenomegaly and peristalsis is normoactive. Extremities have no cyanosis or edema, full range of motion is present. Skin is warm and dry without rash. Neurologic: Mental status is normal, cranial nerves are intact, there are no motor or sensory deficits.  ED Course  Procedures (including critical care time) Labs Review Results for orders placed or performed during the hospital encounter of 10/10/15  CBC with Differential  Result Value Ref Range   WBC 10.5 4.0 - 10.5 K/uL   RBC 4.15 3.87 - 5.11 MIL/uL   Hemoglobin 12.6 12.0 - 15.0 g/dL   HCT 16.1 09.6 - 04.5 %   MCV 91.6 78.0 - 100.0 fL    MCH 30.4 26.0 - 34.0 pg   MCHC 33.2 30.0 - 36.0 g/dL   RDW 40.9 81.1 - 91.4 %   Platelets 280 150 - 400 K/uL   Neutrophils Relative % 64 %   Neutro Abs 6.9 1.7 - 7.7 K/uL   Lymphocytes Relative 27 %   Lymphs Abs 2.8 0.7 - 4.0 K/uL   Monocytes Relative 6 %   Monocytes Absolute 0.7 0.1 - 1.0 K/uL   Eosinophils Relative 2 %   Eosinophils Absolute 0.2 0.0 - 0.7 K/uL   Basophils Relative 1 %   Basophils Absolute 0.1 0.0 - 0.1 K/uL  Urinalysis, Routine w reflex microscopic  Result Value Ref Range   Color, Urine YELLOW YELLOW   APPearance  CLOUDY (A) CLEAR   Specific Gravity, Urine 1.011 1.005 - 1.030   pH 5.0 5.0 - 8.0   Glucose, UA NEGATIVE NEGATIVE mg/dL   Hgb urine dipstick NEGATIVE NEGATIVE   Bilirubin Urine NEGATIVE NEGATIVE   Ketones, ur NEGATIVE NEGATIVE mg/dL   Protein, ur NEGATIVE NEGATIVE mg/dL   Nitrite NEGATIVE NEGATIVE   Leukocytes, UA TRACE (A) NEGATIVE  Comprehensive metabolic panel  Result Value Ref Range   Sodium 127 (L) 135 - 145 mmol/L   Potassium 4.3 3.5 - 5.1 mmol/L   Chloride 93 (L) 101 - 111 mmol/L   CO2 22 22 - 32 mmol/L   Glucose, Bld 127 (H) 65 - 99 mg/dL   BUN 19 6 - 20 mg/dL   Creatinine, Ser 1.61 0.44 - 1.00 mg/dL   Calcium 9.3 8.9 - 09.6 mg/dL   Total Protein 6.6 6.5 - 8.1 g/dL   Albumin 4.3 3.5 - 5.0 g/dL   AST 21 15 - 41 U/L   ALT 17 14 - 54 U/L   Alkaline Phosphatase 74 38 - 126 U/L   Total Bilirubin 0.5 0.3 - 1.2 mg/dL   GFR calc non Af Amer >60 >60 mL/min   GFR calc Af Amer >60 >60 mL/min   Anion gap 12 5 - 15  Urine microscopic-add on  Result Value Ref Range   Squamous Epithelial / LPF 0-5 (A) NONE SEEN   WBC, UA 0-5 0 - 5 WBC/hpf   RBC / HPF 0-5 0 - 5 RBC/hpf   Bacteria, UA FEW (A) NONE SEEN  CBG monitoring, ED  Result Value Ref Range   Glucose-Capillary 92 65 - 99 mg/dL   Imaging Review Ct Head Wo Contrast  10/10/2015  CLINICAL DATA:  Multiple falls yesterday. EXAM: CT HEAD WITHOUT CONTRAST CT CERVICAL SPINE WITHOUT CONTRAST  TECHNIQUE: Multidetector CT imaging of the head and cervical spine was performed following the standard protocol without intravenous contrast. Multiplanar CT image reconstructions of the cervical spine were also generated. COMPARISON:  CT head 07/03/2015 CT head and cervical spine 08/23/2014 FINDINGS: CT HEAD FINDINGS Ventricles and sulci appear symmetrical. No mass effect or midline shift. No abnormal extra-axial fluid collections. Gray-white matter junctions are distinct. Basal cisterns are not effaced. No evidence of acute intracranial hemorrhage. No depressed skull fractures. Visualized paranasal sinuses demonstrate mucosal thickening in the ethmoid air cells. Opacification of right greater than left mastoid air cells. Opacification of left external auditory canal likely due cerumen. CT CERVICAL SPINE FINDINGS There is severe reversal of the usual cervical lordosis with slight anterior subluxation of C3 on C4. Alignment is unchanged since prior study and is likely degenerative. Diffuse degenerative changes throughout the cervical spine with narrowed interspaces and endplate hypertrophic changes. Bridging anterior osteophytes from C4 through C7. No vertebral compression deformities. Normal alignment of the posterior elements. No prevertebral soft tissue swelling. C1-2 articulation appears intact. Congenital nonunion of the posterior arch of C1. No focal bone lesion or bone destruction. Soft tissues are unremarkable. IMPRESSION: No acute intracranial abnormalities. Chronic reversal of the usual cervical lordosis likely degenerative. Diffuse degenerative changes in the cervical spine. No acute displaced fractures identified. Electronically Signed   By: Burman Nieves M.D.   On: 10/10/2015 06:25   Ct Cervical Spine Wo Contrast  10/10/2015  CLINICAL DATA:  Multiple falls yesterday. EXAM: CT HEAD WITHOUT CONTRAST CT CERVICAL SPINE WITHOUT CONTRAST TECHNIQUE: Multidetector CT imaging of the head and cervical spine  was performed following the standard protocol without intravenous contrast. Multiplanar CT  image reconstructions of the cervical spine were also generated. COMPARISON:  CT head 07/03/2015 CT head and cervical spine 08/23/2014 FINDINGS: CT HEAD FINDINGS Ventricles and sulci appear symmetrical. No mass effect or midline shift. No abnormal extra-axial fluid collections. Gray-white matter junctions are distinct. Basal cisterns are not effaced. No evidence of acute intracranial hemorrhage. No depressed skull fractures. Visualized paranasal sinuses demonstrate mucosal thickening in the ethmoid air cells. Opacification of right greater than left mastoid air cells. Opacification of left external auditory canal likely due cerumen. CT CERVICAL SPINE FINDINGS There is severe reversal of the usual cervical lordosis with slight anterior subluxation of C3 on C4. Alignment is unchanged since prior study and is likely degenerative. Diffuse degenerative changes throughout the cervical spine with narrowed interspaces and endplate hypertrophic changes. Bridging anterior osteophytes from C4 through C7. No vertebral compression deformities. Normal alignment of the posterior elements. No prevertebral soft tissue swelling. C1-2 articulation appears intact. Congenital nonunion of the posterior arch of C1. No focal bone lesion or bone destruction. Soft tissues are unremarkable. IMPRESSION: No acute intracranial abnormalities. Chronic reversal of the usual cervical lordosis likely degenerative. Diffuse degenerative changes in the cervical spine. No acute displaced fractures identified. Electronically Signed   By: Burman Nieves M.D.   On: 10/10/2015 06:25   I have personally reviewed and evaluated these images and lab results as part of my medical decision-making.   MDM   Final diagnoses:  Fall at nursing home, initial encounter  Hypoglycemia  Hyponatremia    Hypoglycemia, in patient receiving insulin and metformin. Blood  sugars come up with oral glucose and she is given a sandwich in the ED. Recent falls. She will be sent for CT of head and cervical spine to rule out occult injury. Screening labs will be obtained. Will ask for social service evaluation given the patient's desire not to return to the facility where she is staying. Old records reviewed in she does have prior ED visits for mental status changes. She does have a known history of schizoaffective disorder.  Laboratory workup shows hypernatremia which is not felt to be clinically significant. CTs are unremarkable and remainder of laboratory workup is unremarkable. Social service consult is pending.  Dione Booze, MD 10/10/15 [phone]

## 2015-10-10 NOTE — Discharge Instructions (Signed)

## 2015-10-10 NOTE — Progress Notes (Addendum)
CSW staffed with EDP and Nurse. CSW attempted to speak with patient at bedside, however, patient was asleep at the time. CSW will make another attempt to speak with patient. CSW will consult with Asst. Social Work Interior and spatial designerDirector regarding patient's placement.   CSW received a telephone call from EDP who states patient has been medically cleared and ready for discharge. CSW consulted with Asst. Social Work Interior and spatial designerDirector who stated for [facility]. EDP along with CSW spoke with patient at bedside.[facility] was [facility] CSW informed patient's Nurse of this information.  12:15pm- CSW spoke with Crystal with [facility] regarding patient returning to their facility. She stated CSW would need to speak with someone named Lurena JoinerRebecca regarding patient's return. CSW provided name and contact number for return call.  12:28pm- CSW received a telephone call from Darlyne Russiananesha Tucker, Supervisor with [facility] who states patient is allowed return to their facility. CSW informed EDP and Nurse of this information. [phone]   Elenore PaddyLaVonia Deonna Krummel, LCSWA [phone] ED CSW 8:28 AM 10/10/2015

## 2015-10-10 NOTE — ED Notes (Signed)
Pt transported back to room at this time. Pt yelling stating "I need a meal now!" Pt redirected, remains agitated at this time.

## 2016-02-04 ENCOUNTER — Emergency Department (HOSPITAL_COMMUNITY)
Admission: EM | Admit: 2016-02-04 | Discharge: 2016-02-04 | Disposition: A | Discharge status: Home / Self Care | Payer: Medicare Other | Attending: Emergency Medicine | Admitting: Emergency Medicine

## 2016-02-04 ENCOUNTER — Encounter (HOSPITAL_COMMUNITY): Payer: Self-pay | Admitting: Emergency Medicine

## 2016-02-04 ENCOUNTER — Emergency Department (HOSPITAL_COMMUNITY): Payer: Medicare Other

## 2016-02-04 DIAGNOSIS — J449 Chronic obstructive pulmonary disease, unspecified: Secondary | ICD-10-CM | POA: Insufficient documentation

## 2016-02-04 DIAGNOSIS — I10 Essential (primary) hypertension: Secondary | ICD-10-CM | POA: Insufficient documentation

## 2016-02-04 DIAGNOSIS — Y929 Unspecified place or not applicable: Secondary | ICD-10-CM | POA: Diagnosis not present

## 2016-02-04 DIAGNOSIS — W228XXA Striking against or struck by other objects, initial encounter: Secondary | ICD-10-CM | POA: Diagnosis not present

## 2016-02-04 DIAGNOSIS — W19XXXA Unspecified fall, initial encounter: Secondary | ICD-10-CM

## 2016-02-04 DIAGNOSIS — Y9301 Activity, walking, marching and hiking: Secondary | ICD-10-CM | POA: Diagnosis not present

## 2016-02-04 DIAGNOSIS — Z79899 Other long term (current) drug therapy: Secondary | ICD-10-CM | POA: Diagnosis not present

## 2016-02-04 DIAGNOSIS — Y999 Unspecified external cause status: Secondary | ICD-10-CM | POA: Insufficient documentation

## 2016-02-04 DIAGNOSIS — Z23 Encounter for immunization: Secondary | ICD-10-CM | POA: Insufficient documentation

## 2016-02-04 DIAGNOSIS — S0083XA Contusion of other part of head, initial encounter: Secondary | ICD-10-CM | POA: Insufficient documentation

## 2016-02-04 DIAGNOSIS — E039 Hypothyroidism, unspecified: Secondary | ICD-10-CM | POA: Diagnosis not present

## 2016-02-04 DIAGNOSIS — Z8673 Personal history of transient ischemic attack (TIA), and cerebral infarction without residual deficits: Secondary | ICD-10-CM | POA: Diagnosis not present

## 2016-02-04 DIAGNOSIS — F17210 Nicotine dependence, cigarettes, uncomplicated: Secondary | ICD-10-CM | POA: Diagnosis not present

## 2016-02-04 DIAGNOSIS — Z7982 Long term (current) use of aspirin: Secondary | ICD-10-CM | POA: Insufficient documentation

## 2016-02-04 DIAGNOSIS — S0990XA Unspecified injury of head, initial encounter: Secondary | ICD-10-CM

## 2016-02-04 MED ORDER — TETANUS-DIPHTH-ACELL PERTUSSIS 5-2.5-18.5 LF-MCG/0.5 IM SUSP
0.5000 mL | Freq: Once | INTRAMUSCULAR | Status: AC
  Administered 2016-02-04: 0.5 mL via INTRAMUSCULAR
  Filled 2016-02-04: qty 0.5

## 2016-02-04 MED ORDER — ACETAMINOPHEN 325 MG PO TABS
650.0000 mg | ORAL_TABLET | Freq: Once | ORAL | Status: AC
  Administered 2016-02-04: 650 mg via ORAL
  Filled 2016-02-04: qty 2

## 2016-02-04 NOTE — ED Notes (Signed)
Attempted to [facility] to [facility] with no response.

## 2016-02-04 NOTE — ED Provider Notes (Signed)
[facility]-EMERGENCY DEPT Provider Note   CSN: [id number] Arrival date & time: 02/04/16  1741     History   Chief Complaint No chief complaint on file.   HPI Carrie Clark is a 55 y.o. female.  HPI   Carrie Clark is a 55 y.o. female with PMH significant for COPD, CVA, Depression, HTN, Schizophrenia, and Hypothyroidism who presents with witnessed fall approximately 5 PM today.  Patient states she was walking outside when she lost her balance and fell backwards striking her head.  No LOC. Now complains of headache.  No neck pain, back pain, N/V.  Denies any CP, SOB, dizziness, or syncope.  No prior medications.  No modifying or aggravating factors. She is not anticoagulated.   Past Medical History:  Diagnosis Date  . COPD (chronic obstructive pulmonary disease) (HCC)   . CVA (cerebral infarction)   . Depression   . Hypertension   . Hypothyroidism   . Schizophrenia (HCC)   . Stroke (HCC)   . Tardive dyskinesia     Patient Active Problem List   Diagnosis Date Noted  . Schizoaffective disorder, bipolar type (HCC) 09/11/2015  . Aggression   . Episode of behavior change   . Psychoses     History reviewed. No pertinent surgical history.  OB History    No data available       Home Medications    Prior to Admission medications   Medication Sig Start Date End Date Taking? Authorizing Provider  acetaminophen (TYLENOL) 325 MG tablet Take 325 mg by mouth every 6 (six) hours as needed for moderate pain.    Historical Provider, MD  albuterol (PROVENTIL HFA;VENTOLIN HFA) 108 (90 Base) MCG/ACT inhaler Inhale 2 puffs into the lungs every 6 (six) hours as needed for wheezing or shortness of breath. 06/07/15   Marily Memos, MD  aspirin EC 81 MG tablet Take 81 mg by mouth daily.    Historical Provider, MD  atorvastatin (LIPITOR) 10 MG tablet Take 10 mg by mouth daily.    Historical Provider, MD  benztropine (COGENTIN) 0.5 MG tablet Take 0.5 mg by mouth at bedtime.    Historical Provider, MD    cephALEXin (KEFLEX) 500 MG capsule Take 1 capsule (500 mg total) by mouth every 6 (six) hours. Patient not taking: Reported on 10/10/2015 09/11/15   Charm Rings, NP  cetirizine (ZYRTEC) 10 MG tablet Take 1 tablet (10 mg total) by mouth daily. 11/11/14   Beau Fanny, FNP  donepezil (ARICEPT) 5 MG tablet Take 5 mg by mouth at bedtime.    Historical Provider, MD  famotidine (PEPCID) 20 MG tablet Take 1 tablet (20 mg total) by mouth 2 (two) times daily. 11/11/14   Beau Fanny, FNP  fluPHENAZine (PROLIXIN) 10 MG tablet Take 1 tablet (10 mg total) by mouth at bedtime. 11/11/14   Beau Fanny, FNP  fluticasone (FLONASE) 50 MCG/ACT nasal spray Place 50 sprays into both nostrils 2 (two) times daily as needed. Patient taking differently: Place 1 spray into both nostrils 2 (two) times daily.  11/11/14   Beau Fanny, FNP  furosemide (LASIX) 20 MG tablet Take 1 tablet (20 mg total) by mouth daily. 11/11/14   Beau Fanny, FNP  levothyroxine (SYNTHROID, LEVOTHROID) 50 MCG tablet Take 1 tablet (50 mcg total) by mouth daily. 11/11/14   Beau Fanny, FNP  lisinopril (PRINIVIL,ZESTRIL) 5 MG tablet Take 5 mg by mouth daily.    Historical Provider, MD  LORazepam (ATIVAN) 1 MG tablet Take  1 tablet (1 mg total) by mouth every 8 (eight) hours as needed for anxiety (agitation). Patient taking differently: Take 0.5 mg by mouth every 6 (six) hours as needed for anxiety (agitation). And 0.5mg  at bedtime. 09/11/15   Charm Rings, NP  metFORMIN (GLUCOPHAGE) 1000 MG tablet Take 1 tablet (1,000 mg total) by mouth 2 (two) times daily. 09/11/15   Charm Rings, NP  mirtazapine (REMERON) 30 MG tablet Take 30 mg by mouth daily.    Historical Provider, MD  Multiple Vitamins-Minerals (MULTIVITAMIN WITH MINERALS) tablet Take 1 tablet by mouth daily. 11/11/14   Beau Fanny, FNP  ondansetron (ZOFRAN-ODT) 4 MG disintegrating tablet Take 4 mg by mouth every 8 (eight) hours as needed for nausea or vomiting.     Historical  Provider, MD  Oxcarbazepine (TRILEPTAL) 300 MG tablet Take 1 tablet (300 mg total) by mouth 2 (two) times daily. 09/11/15   Charm Rings, NP  potassium chloride (K-DUR,KLOR-CON) 10 MEQ tablet Take 1 tablet (10 mEq total) by mouth daily. 11/11/14   Beau Fanny, FNP  tiotropium (SPIRIVA) 18 MCG inhalation capsule Place 18 mcg into inhaler and inhale daily.    Historical Provider, MD    Family History Family History  Problem Relation Age of Onset  . Family history unknown: Yes    Social History Social History  Substance Use Topics  . Smoking status: Current Every Day Smoker    Packs/day: 1.00    Types: Cigarettes  . Smokeless tobacco: Never Used  . Alcohol use No     Allergies   Fish allergy   Review of Systems Review of Systems All other systems negative unless otherwise stated in HPI   Physical Exam Updated Vital Signs BP 146/85   Pulse 77   Temp 98.2 F (36.8 C)   Resp 16   SpO2 96%   Physical Exam  Constitutional: She is oriented to person, place, and time. She appears well-developed and well-nourished.  Non-toxic appearance. She does not have a sickly appearance. She does not appear ill.  HENT:  Head: Normocephalic. Head is with abrasion and with contusion.    Mouth/Throat: Oropharynx is clear and moist.  Superficial abrasion to superior posterior occiput. Bleeding controlled.   Eyes: Conjunctivae are normal. Pupils are equal, round, and reactive to light.  Neck: Normal range of motion. Neck supple.  Mild cervical midline tenderness.   Cardiovascular: Normal rate and regular rhythm.   Pulmonary/Chest: Effort normal and breath sounds normal. No accessory muscle usage or stridor. No respiratory distress. She has no wheezes. She has no rhonchi. She has no rales.  Abdominal: Soft. Bowel sounds are normal. She exhibits no distension. There is no tenderness.  Musculoskeletal: Normal range of motion. She exhibits tenderness.  Lymphadenopathy:    She has no  cervical adenopathy.  Neurological: She is alert and oriented to person, place, and time.  Speech clear without dysarthria. Cranial nerves grossly intact.  Normal finger to nose.  Normal RAMs.  No pronator drift.  Normal strength and sensation.   Skin: Skin is warm and dry.  Psychiatric: She has a normal mood and affect. Her behavior is normal.     ED Treatments / Results  Labs (all labs ordered are listed, but only abnormal results are displayed) Labs Reviewed - No data to display  EKG  EKG Interpretation None       Radiology Ct Head Wo Contrast  Result Date: 02/04/2016 CLINICAL DATA:  Status post fall.  No loss  of consciousness. EXAM: CT HEAD WITHOUT CONTRAST CT CERVICAL SPINE WITHOUT CONTRAST TECHNIQUE: Multidetector CT imaging of the head and cervical spine was performed following the standard protocol without intravenous contrast. Multiplanar CT image reconstructions of the cervical spine were also generated. COMPARISON:  10/10/2015 FINDINGS: CT HEAD FINDINGS Brain: No evidence of acute infarction, hemorrhage, hydrocephalus, extra-axial collection or mass lesion/mass effect. Vascular: No hyperdense vessel or unexpected calcification. Skull: No osseous abnormality. Sinuses/Orbits: Visualized paranasal sinuses are clear. Visualized mastoid sinuses are clear. Visualized orbits demonstrate no focal abnormality. Other: None CT CERVICAL SPINE FINDINGS Alignment: No static listhesis.  Kyphosis of the cervical spine. Skull base and vertebrae: No acute fracture. No primary bone lesion or focal pathologic process. Soft tissues and spinal canal: No prevertebral fluid or swelling. No visible canal hematoma. Disc levels: Degenerative disc disease with disc height loss at C4-5, C5-6 and C6-7 with anterior bridging osteophytes. Severe right facet arthropathy at C2-3 with right foraminal stenosis. At C3-4 there is bilateral facet arthropathy and uncovertebral degenerative changes with mild foraminal  stenosis. At C5-6 there is bilateral uncovertebral degenerative change. At C6-7 there is bilateral uncovertebral degenerative changes with right foraminal stenosis. At C7-T1 there is bilateral uncovertebral degenerative changes with right foraminal stenosis. Upper chest: Lung apices are clear. Other: No fluid collection or hematoma. IMPRESSION: 1. No acute intracranial pathology. 2. No acute osseous injury of the cervical spine. 3. Cervical spine spondylosis as described above. Electronically Signed   By: Elige KoHetal  Patel   On: 02/04/2016 19:40   Ct Cervical Spine Wo Contrast  Result Date: 02/04/2016 CLINICAL DATA:  Status post fall.  No loss of consciousness. EXAM: CT HEAD WITHOUT CONTRAST CT CERVICAL SPINE WITHOUT CONTRAST TECHNIQUE: Multidetector CT imaging of the head and cervical spine was performed following the standard protocol without intravenous contrast. Multiplanar CT image reconstructions of the cervical spine were also generated. COMPARISON:  10/10/2015 FINDINGS: CT HEAD FINDINGS Brain: No evidence of acute infarction, hemorrhage, hydrocephalus, extra-axial collection or mass lesion/mass effect. Vascular: No hyperdense vessel or unexpected calcification. Skull: No osseous abnormality. Sinuses/Orbits: Visualized paranasal sinuses are clear. Visualized mastoid sinuses are clear. Visualized orbits demonstrate no focal abnormality. Other: None CT CERVICAL SPINE FINDINGS Alignment: No static listhesis.  Kyphosis of the cervical spine. Skull base and vertebrae: No acute fracture. No primary bone lesion or focal pathologic process. Soft tissues and spinal canal: No prevertebral fluid or swelling. No visible canal hematoma. Disc levels: Degenerative disc disease with disc height loss at C4-5, C5-6 and C6-7 with anterior bridging osteophytes. Severe right facet arthropathy at C2-3 with right foraminal stenosis. At C3-4 there is bilateral facet arthropathy and uncovertebral degenerative changes with mild  foraminal stenosis. At C5-6 there is bilateral uncovertebral degenerative change. At C6-7 there is bilateral uncovertebral degenerative changes with right foraminal stenosis. At C7-T1 there is bilateral uncovertebral degenerative changes with right foraminal stenosis. Upper chest: Lung apices are clear. Other: No fluid collection or hematoma. IMPRESSION: 1. No acute intracranial pathology. 2. No acute osseous injury of the cervical spine. 3. Cervical spine spondylosis as described above. Electronically Signed   By: Elige KoHetal  Patel   On: 02/04/2016 19:40    Procedures Procedures (including critical care time)  Medications Ordered in ED Medications  acetaminophen (TYLENOL) tablet 650 mg (650 mg Oral Given 02/04/16 1907)  Tdap (BOOSTRIX) injection 0.5 mL (0.5 mLs Intramuscular Given 02/04/16 1906)     Initial Impression / Assessment and Plan / ED Course  I have reviewed the triage vital signs and the  nursing notes.  Pertinent labs & imaging results that were available during my care of the patient were reviewed by me and considered in my medical decision making (see chart for details).  Clinical Course   Patient presents with fall and head injury.  No focal neurological deficits.  Contusion to posterior head.  Will update tetanus, give Tylenol, and obtain CT head/c-spine. Imaging negative for acute abnormalities.  Return precautions discussed.  Follow up PCP.  Stable for discharge.   Case has been discussed with and seen by Dr. Effie Shy who agrees with the above plan for discharge.   Final Clinical Impressions(s) / ED Diagnoses   Final diagnoses:  Fall, initial encounter  Head injury, initial encounter    New Prescriptions New Prescriptions   No medications on file     Cheri Fowler, PA-C 02/04/16 2012    Cheri Fowler, PA-C 02/04/16 2013    Mancel Bale, MD 02/05/16 1951    Mancel Bale, MD 02/05/16 1952

## 2016-02-04 NOTE — ED Notes (Signed)
[facility] called for transport to [facility].

## 2016-02-04 NOTE — ED Provider Notes (Signed)
  Face-to-face evaluation   History: She fell backwards after tripping, while walking outside.  Physical exam: Alert, cooperative, comfortable. Contusion with superficial abrasion mid occiput. Patient is alert and lucid.  Medical screening examination/treatment/procedure(s) were conducted as a shared visit with non-physician practitioner(s) and myself.  I personally evaluated the patient during the encounter   Mancel BaleElliott Mataio Mele, MD 02/05/16 1952

## 2016-02-04 NOTE — ED Notes (Signed)
Bed: WA01 Expected date:  Expected time:  Means of arrival:  Comments: Fall, head injury

## 2016-02-04 NOTE — ED Triage Notes (Signed)
[facility] for [facility]. Witness fall outside facility. No LOC, abrasion noted to posterior head, bleeding controlled. Denies neck or back pain, no N/V.  Last VS: 116/62, 70s hs, cbg95, 96%ra.

## 2016-08-19 ENCOUNTER — Ambulatory Visit (INDEPENDENT_AMBULATORY_CARE_PROVIDER_SITE_OTHER): Payer: Medicare Other | Admitting: Podiatry

## 2016-08-19 DIAGNOSIS — B351 Tinea unguium: Secondary | ICD-10-CM

## 2016-08-19 DIAGNOSIS — M79676 Pain in unspecified toe(s): Secondary | ICD-10-CM

## 2016-08-19 NOTE — Progress Notes (Signed)
Subjective:     Patient ID: Carrie Clark, female   DOB: [birth year], 56 y.o.   MRN: [id number]  HPI   Review of Systems     Objective:   Physical Exam GENERAL APPEARANCE: Alert, conversant. Appropriately groomed. No acute distress.  VASCULAR: Pedal pulses are  palpable at  [facility] bilateral.  Capillary refill time is immediate to all digits,  Normal temperature gradient.  Digital hair growth is present bilateral  NEUROLOGIC: sensation is normal to 5.07 monofilament at 5/5 sites bilateral.  Light touch is intact bilateral, Muscle strength normal.  MUSCULOSKELETAL: acceptable muscle strength, tone and stability bilateral.  Intrinsic muscluature intact bilateral.  Rectus appearance of foot and digits noted bilateral.   DERMATOLOGIC: skin color, texture, and turgor are within normal limits.  No preulcerative lesions or ulcers  are seen, no interdigital maceration noted.  No open lesions present.   No drainage noted.  NAILS  Thick disfigured discolored nails both feet.      Assessment:     Onychomycosis    Plan:     IE  Debridement of nails  RTC 4 months.   Helane GuntherGregory Pookela Sellin DPM

## 2016-12-05 ENCOUNTER — Encounter (HOSPITAL_COMMUNITY): Payer: Self-pay | Admitting: Emergency Medicine

## 2016-12-05 ENCOUNTER — Emergency Department (HOSPITAL_COMMUNITY)
Admission: EM | Admit: 2016-12-05 | Discharge: 2016-12-06 | Disposition: A | Discharge status: Home / Self Care | Payer: Medicare Other | Attending: Emergency Medicine | Admitting: Emergency Medicine

## 2016-12-05 DIAGNOSIS — R739 Hyperglycemia, unspecified: Secondary | ICD-10-CM | POA: Diagnosis not present

## 2016-12-05 DIAGNOSIS — Z79899 Other long term (current) drug therapy: Secondary | ICD-10-CM | POA: Insufficient documentation

## 2016-12-05 DIAGNOSIS — Z7984 Long term (current) use of oral hypoglycemic drugs: Secondary | ICD-10-CM | POA: Diagnosis not present

## 2016-12-05 DIAGNOSIS — E039 Hypothyroidism, unspecified: Secondary | ICD-10-CM | POA: Diagnosis not present

## 2016-12-05 DIAGNOSIS — J449 Chronic obstructive pulmonary disease, unspecified: Secondary | ICD-10-CM | POA: Diagnosis not present

## 2016-12-05 DIAGNOSIS — I10 Essential (primary) hypertension: Secondary | ICD-10-CM | POA: Insufficient documentation

## 2016-12-05 DIAGNOSIS — Z7982 Long term (current) use of aspirin: Secondary | ICD-10-CM | POA: Insufficient documentation

## 2016-12-05 LAB — CBG MONITORING, ED
GLUCOSE-CAPILLARY: 463 mg/dL — AB (ref 65–99)
GLUCOSE-CAPILLARY: 400 mg/dL — AB (ref 65–99)

## 2016-12-05 MED ORDER — SODIUM CHLORIDE 0.9 % IV BOLUS (SEPSIS)
1000.0000 mL | Freq: Once | INTRAVENOUS | Status: AC
  Administered 2016-12-06: 1000 mL via INTRAVENOUS

## 2016-12-05 MED ORDER — INSULIN ASPART 100 UNIT/ML ~~LOC~~ SOLN
5.0000 [IU] | Freq: Once | SUBCUTANEOUS | Status: AC
  Administered 2016-12-06: 5 [IU] via INTRAVENOUS
  Filled 2016-12-05: qty 1

## 2016-12-05 MED ORDER — SODIUM CHLORIDE 0.9 % IV BOLUS (SEPSIS)
500.0000 mL | Freq: Once | INTRAVENOUS | Status: AC
  Administered 2016-12-06: 500 mL via INTRAVENOUS

## 2016-12-05 NOTE — ED Triage Notes (Signed)
Pt from [facility] and has been having high blood sugars all day. Facility member cannot tell me her prior blood sugars.

## 2016-12-06 DIAGNOSIS — R739 Hyperglycemia, unspecified: Secondary | ICD-10-CM | POA: Diagnosis not present

## 2016-12-06 LAB — BASIC METABOLIC PANEL
Anion gap: 11 (ref 5–15)
BUN: 19 mg/dL (ref 6–20)
CALCIUM: 9.7 mg/dL (ref 8.9–10.3)
CHLORIDE: 96 mmol/L — AB (ref 101–111)
CO2: 29 mmol/L (ref 22–32)
CREATININE: 0.86 mg/dL (ref 0.44–1.00)
GFR calc non Af Amer: >60 mL/min (ref >60)
GLUCOSE: 438 mg/dL — AB (ref 65–99)
Potassium: 4.3 mmol/L (ref 3.5–5.1)
Sodium: 136 mmol/L (ref 135–145)

## 2016-12-06 LAB — CBC
HCT: 41.2 % (ref 36.0–46.0)
HEMOGLOBIN: 13.7 g/dL (ref 12.0–15.0)
MCH: 31.8 pg (ref 26.0–34.0)
MCHC: 33.3 g/dL (ref 30.0–36.0)
MCV: 95.6 fL (ref 78.0–100.0)
PLATELETS: 257 10*3/uL (ref 150–400)
RBC: 4.31 MIL/uL (ref 3.87–5.11)
RDW: 13.1 % (ref 11.5–15.5)
WBC: 11.2 10*3/uL — ABNORMAL HIGH (ref 4.0–10.5)

## 2016-12-06 LAB — LACTIC ACID, PLASMA: LACTIC ACID, VENOUS: 4.3 mmol/L — AB (ref 0.5–1.9)

## 2016-12-06 LAB — CBG MONITORING, ED
Glucose-Capillary: 288 mg/dL — ABNORMAL HIGH (ref 65–99)
Glucose-Capillary: 278 mg/dL — ABNORMAL HIGH (ref 65–99)

## 2016-12-06 NOTE — ED Notes (Signed)
Gave pt a urine cup and requested urine sample.  Pt returned cup with clear cold water

## 2016-12-06 NOTE — ED Notes (Signed)
ED Provider at bedside. 

## 2016-12-06 NOTE — ED Notes (Signed)
Informed Dr Lynelle DoctorKnapp about critical lactic and that pt removed own IV.  Per Dr Lynelle DoctorKnapp gave pt 4 cups of water to drink

## 2016-12-06 NOTE — ED Provider Notes (Signed)
[facility]-EMERGENCY DEPT Provider Note   CSN: [id number] Arrival date & time: 12/05/16  2013  Time seen 23:30 PM    History   Chief Complaint Chief Complaint  Patient presents with  . Hyperglycemia    HPI Carrie Clark is a 56 y.o. female.  HPI  patient is here with her nursing aid from her assisted living facility. Patient is allowed to go to the store and buy items and yesterday she bought some white powdered sugar donuts which she ate yesterday evening and this morning. The aide reports her blood sugar this morning was in the 300s and then this evening it got up into the 500s. Patient has been thirsty all day and drinking well water. She also reports she's had polyuria for at least a month. She denies nausea, vomiting, chest pain, shortness of breath, fever. Her aide states her behavior is the same as usual. She states she's had some dry cough. Pt is on metformin, and she is not on insulin.   PCP Duffy RhodyPartridge, Tanillya, FNP   Past Medical History:  Diagnosis Date  . COPD (chronic obstructive pulmonary disease) (HCC)   . CVA (cerebral infarction)   . Depression   . Hypertension   . Hypothyroidism   . Schizophrenia (HCC)   . Stroke (HCC)   . Tardive dyskinesia     Patient Active Problem List   Diagnosis Date Noted  . Schizoaffective disorder, bipolar type (HCC) 09/11/2015  . Aggression   . Episode of behavior change   . Psychoses     History reviewed. No pertinent surgical history.  OB History    No data available       Home Medications    Prior to Admission medications   Medication Sig Start Date End Date Taking? Authorizing Provider  acetaminophen (TYLENOL) 325 MG tablet Take 325 mg by mouth every 6 (six) hours as needed for moderate pain.    [provider]  albuterol (PROVENTIL HFA;VENTOLIN HFA) 108 (90 Base) MCG/ACT inhaler Inhale 2 puffs into the lungs every 6 (six) hours as needed for wheezing or shortness of breath. 06/07/15   Mesner, Barbara CowerJason, MD    aspirin EC 81 MG tablet Take 81 mg by mouth daily.    [provider]  atorvastatin (LIPITOR) 10 MG tablet Take 10 mg by mouth daily.    [provider]  benztropine (COGENTIN) 0.5 MG tablet Take 0.5 mg by mouth at bedtime.    [provider]  cephALEXin (KEFLEX) 500 MG capsule Take 1 capsule (500 mg total) by mouth every 6 (six) hours. Patient not taking: Reported on 10/10/2015 09/11/15   Charm RingsLord, Jamison Y, NP  cetirizine (ZYRTEC) 10 MG tablet Take 1 tablet (10 mg total) by mouth daily. 11/11/14   Withrow, Everardo AllJohn C, FNP  donepezil (ARICEPT) 5 MG tablet Take 5 mg by mouth at bedtime.    [provider]  famotidine (PEPCID) 20 MG tablet Take 1 tablet (20 mg total) by mouth 2 (two) times daily. 11/11/14   Withrow, Everardo AllJohn C, FNP  fluPHENAZine (PROLIXIN) 10 MG tablet Take 1 tablet (10 mg total) by mouth at bedtime. 11/11/14   Withrow, Everardo AllJohn C, FNP  fluticasone (FLONASE) 50 MCG/ACT nasal spray Place 50 sprays into both nostrils 2 (two) times daily as needed. Patient taking differently: Place 1 spray into both nostrils 2 (two) times daily.  11/11/14   Withrow, Everardo AllJohn C, FNP  furosemide (LASIX) 20 MG tablet Take 1 tablet (20 mg total) by mouth daily. 11/11/14  Withrow, Everardo All, FNP  levothyroxine (SYNTHROID, LEVOTHROID) 50 MCG tablet Take 1 tablet (50 mcg total) by mouth daily. 11/11/14   Withrow, Everardo All, FNP  lisinopril (PRINIVIL,ZESTRIL) 5 MG tablet Take 5 mg by mouth daily.    [provider]  LORazepam (ATIVAN) 1 MG tablet Take 1 tablet (1 mg total) by mouth every 8 (eight) hours as needed for anxiety (agitation). Patient taking differently: Take 0.5 mg by mouth every 6 (six) hours as needed for anxiety (agitation). And 0.5mg  at bedtime. 09/11/15   Charm Rings, NP  metFORMIN (GLUCOPHAGE) 1000 MG tablet Take 1 tablet (1,000 mg total) by mouth 2 (two) times daily. 09/11/15   Charm Rings, NP  mirtazapine (REMERON) 30 MG tablet Take 30 mg by mouth daily.    [provider]  Multiple Vitamins-Minerals (MULTIVITAMIN WITH MINERALS) tablet Take 1 tablet by mouth daily. 11/11/14   Withrow, Everardo All, FNP  ondansetron (ZOFRAN-ODT) 4 MG disintegrating tablet Take 4 mg by mouth every 8 (eight) hours as needed for nausea or vomiting.     [provider]  Oxcarbazepine (TRILEPTAL) 300 MG tablet Take 1 tablet (300 mg total) by mouth 2 (two) times daily. 09/11/15   Charm Rings, NP  potassium chloride (K-DUR,KLOR-CON) 10 MEQ tablet Take 1 tablet (10 mEq total) by mouth daily. 11/11/14   Withrow, Everardo All, FNP  tiotropium (SPIRIVA) 18 MCG inhalation capsule Place 18 mcg into inhaler and inhale daily.    [provider]    Family History Family History  Problem Relation Age of Onset  . Family history unknown: Yes    Social History Social History  Substance Use Topics  . Smoking status: Current Every Day Smoker    Packs/day: 1.00    Types: Cigarettes  . Smokeless tobacco: Never Used  . Alcohol use No  lives in ALF   Allergies   Fish allergy   Review of Systems Review of Systems  All other systems reviewed and are negative.    Physical Exam Updated Vital Signs BP (!) 112/49   Pulse 86   Temp 98.1 F (36.7 C) (Oral)   Resp 18   Ht 5\' 4"  (1.626 m)   Wt 60.8 kg (134 lb)   SpO2 95%   BMI 23.00 kg/m   Vital signs normal    Physical Exam  Constitutional: She is oriented to person, place, and time. She appears well-developed and well-nourished.  Non-toxic appearance. She does not appear ill. No distress.  Seems mentally slow  HENT:  Head: Normocephalic and atraumatic.  Right Ear: External ear normal.  Left Ear: External ear normal.  Nose: Nose normal. No mucosal edema or rhinorrhea.  Mouth/Throat: Oropharynx is clear and moist and mucous membranes are normal. No dental abscesses or uvula swelling.  Eyes: Pupils are equal, round, and reactive to light. Conjunctivae and EOM are normal.  Neck: Normal range of motion and  full passive range of motion without pain. Neck supple.  Cardiovascular: Normal rate, regular rhythm and normal heart sounds.  Exam reveals no gallop and no friction rub.   No murmur heard. Pulmonary/Chest: Effort normal and breath sounds normal. No respiratory distress. She has no wheezes. She has no rhonchi. She has no rales. She exhibits no tenderness and no crepitus.  Abdominal: Soft. Normal appearance and bowel sounds are normal. She exhibits no distension. There is no tenderness. There is no rebound and no guarding.  Musculoskeletal: Normal range of motion. She exhibits no edema or  tenderness.  Moves all extremities well.   Neurological: She is alert and oriented to person, place, and time. She has normal strength. No cranial nerve deficit.  Skin: Skin is warm, dry and intact. No rash noted. No erythema. No pallor.  Psychiatric: She has a normal mood and affect. Her speech is normal and behavior is normal. Her mood appears not anxious.  Nursing note and vitals reviewed.    ED Treatments / Results  Labs (all labs ordered are listed, but only abnormal results are displayed) Results for orders placed or performed during the hospital encounter of 12/05/16  Basic metabolic panel  Result Value Ref Range   Sodium 136 135 - 145 mmol/L   Potassium 4.3 3.5 - 5.1 mmol/L   Chloride 96 (L) 101 - 111 mmol/L   CO2 29 22 - 32 mmol/L   Glucose, Bld 438 (H) 65 - 99 mg/dL   BUN 19 6 - 20 mg/dL   Creatinine, Ser 1.61 0.44 - 1.00 mg/dL   Calcium 9.7 8.9 - 09.6 mg/dL   GFR calc non Af Amer >60 >60 mL/min   GFR calc Af Amer >60 >60 mL/min   Anion gap 11 5 - 15  CBC  Result Value Ref Range   WBC 11.2 (H) 4.0 - 10.5 K/uL   RBC 4.31 3.87 - 5.11 MIL/uL   Hemoglobin 13.7 12.0 - 15.0 g/dL   HCT 04.5 40.9 - 81.1 %   MCV 95.6 78.0 - 100.0 fL   MCH 31.8 26.0 - 34.0 pg   MCHC 33.3 30.0 - 36.0 g/dL   RDW 91.4 78.2 - 95.6 %   Platelets 257 150 - 400 K/uL  Lactic acid, plasma  Result Value Ref Range    Lactic Acid, Venous 4.3 (HH) 0.5 - 1.9 mmol/L  CBG monitoring, ED  Result Value Ref Range   Glucose-Capillary 463 (H) 65 - 99 mg/dL  CBG monitoring, ED  Result Value Ref Range   Glucose-Capillary 400 (H) 65 - 99 mg/dL  CBG monitoring, ED  Result Value Ref Range   Glucose-Capillary 288 (H) 65 - 99 mg/dL  CBG monitoring, ED  Result Value Ref Range   Glucose-Capillary 278 (H) 65 - 99 mg/dL   Laboratory interpretation all normal except Improving hyperglycemia EKG  EKG Interpretation None       Radiology No results found.  Procedures Procedures (including critical care time)  Medications Ordered in ED Medications  sodium chloride 0.9 % bolus 1,000 mL (0 mLs Intravenous Stopped 12/06/16 0137)  sodium chloride 0.9 % bolus 500 mL (0 mLs Intravenous Stopped 12/06/16 0137)  insulin aspart (novoLOG) injection 5 Units (5 Units Intravenous Given 12/06/16 0016)     Initial Impression / Assessment and Plan / ED Course  I have reviewed the triage vital signs and the nursing notes.  Pertinent labs & imaging results that were available during my care of the patient were reviewed by me and considered in my medical decision making (see chart for details).  Patient was given IV fluids and IV insulin. Laboratory testing was ordered.  Patient removed her IV after her first liter of fluid had gone in. She did get the IV insulin. She was given water to drink and drink 4 glasses. Her repeat CBG was improving. She was discharged back to her facility.  UA was not done because patient gave the nurse a cup where the fluid was clear and cool so they suspect she either put toilet water in the cup or sink water.  Final Clinical Impressions(s) / ED Diagnoses   Final diagnoses:  Hyperglycemia    Plan discharge  Devoria Albe, MD, Concha Pyo, MD 12/06/16 [phone]

## 2016-12-06 NOTE — Discharge Instructions (Signed)
Drink plenty of fluids. NO MORE DONUTS!!! Monitor your blood sugar closely. Recheck as needed.

## 2017-01-20 ENCOUNTER — Encounter (HOSPITAL_COMMUNITY): Payer: Self-pay

## 2017-01-20 ENCOUNTER — Emergency Department (HOSPITAL_COMMUNITY)
Admission: EM | Admit: 2017-01-20 | Discharge: 2017-01-20 | Disposition: A | Discharge status: Home / Self Care | Payer: Medicare Other | Attending: Emergency Medicine | Admitting: Emergency Medicine

## 2017-01-20 DIAGNOSIS — E871 Hypo-osmolality and hyponatremia: Secondary | ICD-10-CM | POA: Insufficient documentation

## 2017-01-20 DIAGNOSIS — J449 Chronic obstructive pulmonary disease, unspecified: Secondary | ICD-10-CM | POA: Diagnosis not present

## 2017-01-20 DIAGNOSIS — Y9389 Activity, other specified: Secondary | ICD-10-CM | POA: Insufficient documentation

## 2017-01-20 DIAGNOSIS — I10 Essential (primary) hypertension: Secondary | ICD-10-CM | POA: Insufficient documentation

## 2017-01-20 DIAGNOSIS — F17210 Nicotine dependence, cigarettes, uncomplicated: Secondary | ICD-10-CM | POA: Insufficient documentation

## 2017-01-20 DIAGNOSIS — Z7982 Long term (current) use of aspirin: Secondary | ICD-10-CM | POA: Insufficient documentation

## 2017-01-20 DIAGNOSIS — Y92198 Other place in other specified residential institution as the place of occurrence of the external cause: Secondary | ICD-10-CM | POA: Insufficient documentation

## 2017-01-20 DIAGNOSIS — E039 Hypothyroidism, unspecified: Secondary | ICD-10-CM | POA: Diagnosis not present

## 2017-01-20 DIAGNOSIS — Z7984 Long term (current) use of oral hypoglycemic drugs: Secondary | ICD-10-CM | POA: Diagnosis not present

## 2017-01-20 DIAGNOSIS — R451 Restlessness and agitation: Secondary | ICD-10-CM | POA: Insufficient documentation

## 2017-01-20 DIAGNOSIS — W19XXXA Unspecified fall, initial encounter: Secondary | ICD-10-CM | POA: Insufficient documentation

## 2017-01-20 DIAGNOSIS — R42 Dizziness and giddiness: Secondary | ICD-10-CM | POA: Diagnosis present

## 2017-01-20 DIAGNOSIS — Z79899 Other long term (current) drug therapy: Secondary | ICD-10-CM | POA: Insufficient documentation

## 2017-01-20 DIAGNOSIS — Y999 Unspecified external cause status: Secondary | ICD-10-CM | POA: Diagnosis not present

## 2017-01-20 LAB — COMPREHENSIVE METABOLIC PANEL
ALBUMIN: 4.1 g/dL (ref 3.5–5.0)
ALT: 15 U/L (ref 14–54)
ANION GAP: 8 (ref 5–15)
AST: 24 U/L (ref 15–41)
Alkaline Phosphatase: 68 U/L (ref 38–126)
BUN: 13 mg/dL (ref 6–20)
CALCIUM: 9.5 mg/dL (ref 8.9–10.3)
CHLORIDE: 88 mmol/L — AB (ref 101–111)
CO2: 30 mmol/L (ref 22–32)
CREATININE: 0.64 mg/dL (ref 0.44–1.00)
GFR calc Af Amer: >60 mL/min (ref >60)
GFR calc non Af Amer: >60 mL/min (ref >60)
GLUCOSE: 164 mg/dL — AB (ref 65–99)
POTASSIUM: 3.9 mmol/L (ref 3.5–5.1)
SODIUM: 126 mmol/L — AB (ref 135–145)
Total Bilirubin: 0.4 mg/dL (ref 0.3–1.2)
Total Protein: 6.3 g/dL — ABNORMAL LOW (ref 6.5–8.1)

## 2017-01-20 LAB — URINALYSIS, ROUTINE W REFLEX MICROSCOPIC
Bilirubin Urine: NEGATIVE
GLUCOSE, UA: NEGATIVE mg/dL
Hgb urine dipstick: NEGATIVE
KETONES UR: NEGATIVE mg/dL
NITRITE: NEGATIVE
PH: 6 (ref 5.0–8.0)
Protein, ur: NEGATIVE mg/dL
Specific Gravity, Urine: 1.009 (ref 1.005–1.030)

## 2017-01-20 LAB — CBC
HCT: 36.5 % (ref 36.0–46.0)
Hemoglobin: 12.9 g/dL (ref 12.0–15.0)
MCH: 32.4 pg (ref 26.0–34.0)
MCHC: 35.3 g/dL (ref 30.0–36.0)
MCV: 91.7 fL (ref 78.0–100.0)
Platelets: 327 10*3/uL (ref 150–400)
RBC: 3.98 MIL/uL (ref 3.87–5.11)
RDW: 12.4 % (ref 11.5–15.5)
WBC: 10.7 10*3/uL — AB (ref 4.0–10.5)

## 2017-01-20 LAB — TROPONIN I: Troponin I: <0.03 ng/mL (ref <0.03)

## 2017-01-20 MED ORDER — SODIUM CHLORIDE 1 G PO TABS: 1.0000 g | ORAL_TABLET | Freq: Two times a day (BID) | ORAL | 0 refills | Status: DC

## 2017-01-20 MED ORDER — ONDANSETRON HCL 4 MG/2ML IJ SOLN: 4.0000 mg | Freq: Once | INTRAMUSCULAR | Status: DC

## 2017-01-20 MED ORDER — ONDANSETRON HCL 4 MG PO TABS
4.0000 mg | ORAL_TABLET | Freq: Once | ORAL | Status: AC
  Administered 2017-01-20: 4 mg via ORAL
  Filled 2017-01-20: qty 1

## 2017-01-20 MED ORDER — SODIUM CHLORIDE 0.9 % IV BOLUS (SEPSIS)
1000.0000 mL | Freq: Once | INTRAVENOUS | Status: AC
  Administered 2017-01-20: 1000 mL via INTRAVENOUS

## 2017-01-20 NOTE — ED Provider Notes (Signed)
[facility]-EM[facility] DEPT Provider Note   CSN: [id number] Arrival date & time: 01/20/17  1251     History   Chief Complaint Chief Complaint  Patient presents with  . Dizziness    HPI Carrie Clark is a 56 y.o. female.  HPI   Carrie Clark is a 56yo female with a history of schizoaffective disorder, psychosis, bipolar disorder on a variety of psychotropic medications who presents to the Emergency Department via EMS after having a witnessed fall at her group home this morning around 10:30AM. Of note, patient is a difficult historian. Patient states that she felt normal today but is here because she "fell twice." She says that she was in her bedroom early this morning when she fell over and caught herself on her bed. She states that she did not hit her head or lose consciousness at this time. Later this morning, when she was standing smoking a cigar outside she states that she just "fell over." She states that she fell on the concrete. Remembers the entire fall, denies feeling light headed, dizzy, chest pain, shortness of breath, confusion prior or following the fall. States that she thinks she fell on her side. Denies biting her tongue or losing consciousness. She states that she doesn't think this has ever happened before. Does not use assistance with ambulation. Denies recent changes to her medication regimen, denies drug use. Does not have any complaints at this time, would like to have a "snack and sit out in the lounge."   Past Medical History:  Diagnosis Date  . COPD (chronic obstructive pulmonary disease) (HCC)   . CVA (cerebral infarction)   . Depression   . Hypertension   . Hypothyroidism   . Schizophrenia (HCC)   . Stroke (HCC)   . Tardive dyskinesia     Patient Active Problem List   Diagnosis Date Noted  . Schizoaffective disorder, bipolar type (HCC) 09/11/2015  . Aggression   . Episode of behavior change   . Psychoses     History reviewed. No pertinent surgical  history.  OB History    No data available       Home Medications    Prior to Admission medications   Medication Sig Start Date End Date Taking? Authorizing Provider  acetaminophen (TYLENOL) 325 MG tablet Take 325 mg by mouth every 6 (six) hours as needed for moderate pain.   Yes [provider]  albuterol (PROVENTIL HFA;VENTOLIN HFA) 108 (90 Base) MCG/ACT inhaler Inhale 2 puffs into the lungs every 6 (six) hours as needed for wheezing or shortness of breath. 06/07/15  Yes Mesner, Barbara Cower, MD  benztropine (COGENTIN) 0.5 MG tablet Take 0.5 mg by mouth at bedtime.   Yes [provider]  cetirizine (ZYRTEC) 10 MG tablet Take 1 tablet (10 mg total) by mouth daily. 11/11/14  Yes Withrow, Everardo All, FNP  donepezil (ARICEPT) 5 MG tablet Take 5 mg by mouth at bedtime.   Yes [provider]  famotidine (PEPCID) 20 MG tablet Take 1 tablet (20 mg total) by mouth 2 (two) times daily. 11/11/14  Yes Withrow, Everardo All, FNP  fluPHENAZine (PROLIXIN) 10 MG tablet Take 1 tablet (10 mg total) by mouth at bedtime. 11/11/14  Yes Withrow, Everardo All, FNP  fluticasone (FLONASE) 50 MCG/ACT nasal spray Place 50 sprays into both nostrils 2 (two) times daily as needed. Patient taking differently: Place 1 spray into both nostrils 2 (two) times daily.  11/11/14  Yes Withrow, Everardo All, FNP  furosemide (LASIX) 20 MG tablet  Take 1 tablet (20 mg total) by mouth daily. 11/11/14  Yes Withrow, Everardo All, FNP  levothyroxine (SYNTHROID, LEVOTHROID) 50 MCG tablet Take 1 tablet (50 mcg total) by mouth daily. 11/11/14  Yes Withrow, Everardo All, FNP  lisinopril (PRINIVIL,ZESTRIL) 5 MG tablet Take 5 mg by mouth daily.   Yes [provider]  LORazepam (ATIVAN) 1 MG tablet Take 1 tablet (1 mg total) by mouth every 8 (eight) hours as needed for anxiety (agitation). Patient taking differently: Take 0.5 mg by mouth every 6 (six) hours as needed for anxiety (agitation). And 0.5mg  at bedtime. 09/11/15  Yes Charm Rings, NP   metFORMIN (GLUCOPHAGE) 1000 MG tablet Take 1 tablet (1,000 mg total) by mouth 2 (two) times daily. 09/11/15  Yes Charm Rings, NP  mirtazapine (REMERON) 30 MG tablet Take 30 mg by mouth daily.   Yes [provider]  Multiple Vitamins-Minerals (MULTIVITAMIN WITH MINERALS) tablet Take 1 tablet by mouth daily. 11/11/14  Yes Withrow, Everardo All, FNP  ondansetron (ZOFRAN-ODT) 4 MG disintegrating tablet Take 4 mg by mouth every 8 (eight) hours as needed for nausea or vomiting.    Yes [provider]  potassium chloride (K-DUR,KLOR-CON) 10 MEQ tablet Take 1 tablet (10 mEq total) by mouth daily. 11/11/14  Yes Withrow, Everardo All, FNP  tiotropium (SPIRIVA) 18 MCG inhalation capsule Place 18 mcg into inhaler and inhale daily.   Yes [provider]  aspirin EC 81 MG tablet Take 81 mg by mouth daily.    [provider]  atorvastatin (LIPITOR) 10 MG tablet Take 10 mg by mouth daily.    [provider]  cephALEXin (KEFLEX) 500 MG capsule Take 1 capsule (500 mg total) by mouth every 6 (six) hours. Patient not taking: Reported on 10/10/2015 09/11/15   Charm Rings, NP  Oxcarbazepine (TRILEPTAL) 300 MG tablet Take 1 tablet (300 mg total) by mouth 2 (two) times daily. Patient not taking: Reported on 01/20/2017 09/11/15   Charm Rings, NP    Family History Family History  Problem Relation Age of Onset  . Family history unknown: Yes    Social History Social History  Substance Use Topics  . Smoking status: Current Every Day Smoker    Packs/day: 1.00    Types: Cigarettes  . Smokeless tobacco: Never Used  . Alcohol use No     Allergies   Fish allergy   Review of Systems Review of Systems  Constitutional: Negative for chills, diaphoresis, fatigue and fever.  HENT: Negative for congestion, ear pain and sore throat.   Eyes: Negative for pain and visual disturbance.  Respiratory: Negative for cough, shortness of breath and wheezing.   Cardiovascular: Negative  for chest pain and palpitations.  Gastrointestinal: Negative for abdominal pain, diarrhea, nausea and vomiting.  Genitourinary: Negative for difficulty urinating and dysuria.  Musculoskeletal: Negative for arthralgias and joint swelling.  Skin: Negative for rash and wound.  Neurological: Negative for dizziness, weakness, light-headedness, numbness and headaches.  Psychiatric/Behavioral: Negative for confusion.     Physical Exam Updated Vital Signs BP 112/69 (BP Location: Right Arm)   Pulse 66   Temp 98.2 F (36.8 C) (Oral)   Resp 16   Ht 5\' 4"  (1.626 m)   Wt 49.9 kg (110 lb)   SpO2 98%   BMI 18.88 kg/m   Physical Exam  Constitutional: She appears well-developed and well-nourished. No distress.  HENT:  Head: Normocephalic and atraumatic.  Mouth/Throat: Oropharynx is clear and moist. No oropharyngeal exudate.  Eyes: Pupils are equal, round, and reactive to light. Conjunctivae and EOM are normal. Right eye exhibits no discharge. Left eye exhibits no discharge.  Neck: Normal range of motion. Neck supple.  Cardiovascular: Normal rate and regular rhythm.  Exam reveals no gallop and no friction rub.   Murmur (Grade 2/6 systolic, heard best at the right upper sternal border ) heard. Pulmonary/Chest: Effort normal. No respiratory distress. She has no wheezes. She has no rales.  Abdominal: Soft. Bowel sounds are normal. There is no tenderness. There is no guarding.  Neurological: She is alert. She has normal strength. No cranial nerve deficit or sensory deficit. Coordination and gait normal.  No tremor noted with hands out in front of her.   Skin: Skin is warm and dry. She is not diaphoretic.  Psychiatric: She has a normal mood and affect. She is agitated. She expresses inappropriate judgment (Patient would like to go to the "lounge" and leave. Is not concerned about her falling episodes today.  ). She expresses no homicidal and no suicidal ideation.  Speech is mumbled, difficult to  understand.   Nursing note and vitals reviewed.    ED Treatments / Results  Labs (all labs ordered are listed, but only abnormal results are displayed) Labs Reviewed  CBC - Abnormal; Notable for the following:       Result Value   WBC 10.7 (*)    All other components within normal limits  COMPREHENSIVE METABOLIC PANEL - Abnormal; Notable for the following:    Sodium 126 (*)    Chloride 88 (*)    Glucose, Bld 164 (*)    Total Protein 6.3 (*)    All other components within normal limits  TROPONIN I  URINALYSIS, ROUTINE W REFLEX MICROSCOPIC    EKG  EKG Interpretation  Date/Time:  Monday January 20 2017 13:02:42 EDT Ventricular Rate:  69 PR Interval:    QRS Duration: 76 QT Interval:  389 QTC Calculation: 417 R Axis:   52 Text Interpretation:  Sinus rhythm Low voltage, precordial leads Borderline repolarization abnormality Borderline ST elevation, lateral leads Since last tracing Q wave now present in lead lll, ST abnormality has persisted over time Confirmed by Eber Hong ([id number]) on 01/20/2017 3:18:08 PM       Radiology No results found.  Procedures Procedures (including critical care time)  Medications Ordered in ED Medications - No data to display   Initial Impression / Assessment and Plan / ED Course  I have reviewed the triage vital signs and the nursing notes.  Pertinent labs & imaging results that were available during my care of the patient were reviewed by me and considered in my medical decision making (see chart for details).   Patient was a difficult historian, this could be a syncopal episode. Will work her up for it. Do not think that she is having a stroke at this time, as she has no focal neurological deficits on exam. EKG shows sinus rhythm, Q-waves in lead III which is new from her last EKG in February, 2017. Although patient is not having chest pain at this time, will get a troponin because of the abnormal EKG.  Do not think this is ACS because  she denies chest pain, troponin negative and EKG shows Q-wave in lead III which is likely old. Other labs reviewed. UA shows moderate amount of leukocytes with few bacteria. Will send for culture. Unlikely that a UTI caused the patient's fall today given UA. CMP shows that her sodium is  126, this seems as if it is a chronic issue for her as she had hyponatremia 09/2015 (with sodium of 127). Replaced with NS bolus. Will send her home with salt tablets. Furthermore, patient is not hypoglycemic (glucose 164).   Patient is taking many psychotropic medications which may be contributing to her fall today. She is not complaining of any chest pain, numbness, weakness, shortness of breath, headache at this time. She is able to ambulate without difficulty. At this time I believe she is safe to go home. No focal neurologic deficits again on recheck before discharge. Vital signs Blood pressure 112/69, pulse 66, temperature 98.2 F (36.8 C), temperature source Oral, resp. rate 16, height 5\' 4"  (1.626 m), weight 49.9 kg (110 lb), SpO2 98 %.  Discussed this patient with Dr. Hyacinth Meeker who agrees to the plan and also saw the patient. Patient agrees with the plan and return precautions discussed. She voices understanding.     Final Clinical Impressions(s) / ED Diagnoses   Final diagnoses:  None    New Prescriptions New Prescriptions   No medications on file     Lawrence Marseilles 01/20/17 1819    Eber Hong, MD 01/27/17 [phone]

## 2017-01-20 NOTE — ED Triage Notes (Signed)
Pt complaining of dizziness and lightheadedness for the last few days. States she has been stumbling around for the last few days. States she has issues with her "sugar" but no history of diabetes. EMS said CBG was 90 upon arrival.  VSS NAD

## 2017-01-20 NOTE — ED Provider Notes (Signed)
Medical screening examination/treatment/procedure(s) were conducted as a shared visit with non-physician practitioner(s) and myself.  I personally evaluated the patient during the encounter.  Clinical Impression:   Final diagnoses:  Fall, initial encounter  Hyponatremia    The pt presents after having 2 falls today - denies CP, palp, SOB, headache, or focal neuro deficits or complaints - she has hx of multiple psych hx and meds but denies having mania, insomnia, depression or active psychosis.  She has been eating and drinking since arrival and has no c/o - asking when she can go home.  She denies LOC with either fall.    On exam she has clear lungs and heart sounds, no arrhythmia - pt sitting in chair and able to stand and walk the room without difficutly, no drift, no facial droop - normal speech and normal strength in all 4 extrmities.  Follows commands without difficutly including FNF.   check ua, labs, ECG (essentially unchanged). Pt agreeable Anticipate d/c if w/u neg.   Eber Hong, MD 01/27/17 [phone]

## 2017-01-20 NOTE — ED Notes (Signed)
Went into patients room to get VS and to check on patient. Pt has taken off all Cardiac monitoring leads, SPO2 monitor, and BP cuff. Sitting in chair fully dressed. Told pt. We needed to put leads back on her chest and to get VS. Pt states, "No, I don' need those on me any longer." Pt would not comply

## 2017-01-20 NOTE — ED Notes (Signed)
Called [facility] about coming to pick up pt.

## 2017-01-20 NOTE — ED Notes (Signed)
Pt came out to nurses station stating, "I am ready to eat. I have not had anything to eat all day." Pt ripped out IV and had a piece of tissue stopping to blood. Went in pt's room and IV tip was intact. Covered with cotton ball and a band aid.

## 2017-01-20 NOTE — Discharge Instructions (Signed)
Please take salt tablets twice a day for a week for your low salt level in the Emergency Department.   We are sending a urine sample for culture to see if there is an infection. You will be notified of these results in a few days to a week.   Please return if you should have another fall, hit your head, lose consciousness.

## 2017-01-20 NOTE — ED Notes (Signed)
Pt given dinner tray.

## 2017-01-23 LAB — URINE CULTURE

## 2017-01-24 ENCOUNTER — Telehealth: Payer: Self-pay

## 2017-01-24 NOTE — Telephone Encounter (Signed)
No treatment needed for [facility] ED 01/20/17 per Sharin MonsEmily Sinclair Pharm D and Will Dansie PA-C

## 2017-02-03 ENCOUNTER — Ambulatory Visit: Payer: Medicare Other | Admitting: Podiatry

## 2017-04-19 IMAGING — CR DG CHEST 2V
2 series · 2 of 2 positions shown · non-contrast
Comparison: 02/05/2015

CLINICAL DATA: Shortness of breath and chest pain for several
hours. COPD. Hypertension. Smoker.

EXAM:
CHEST  2 VIEW

[w chest lat]
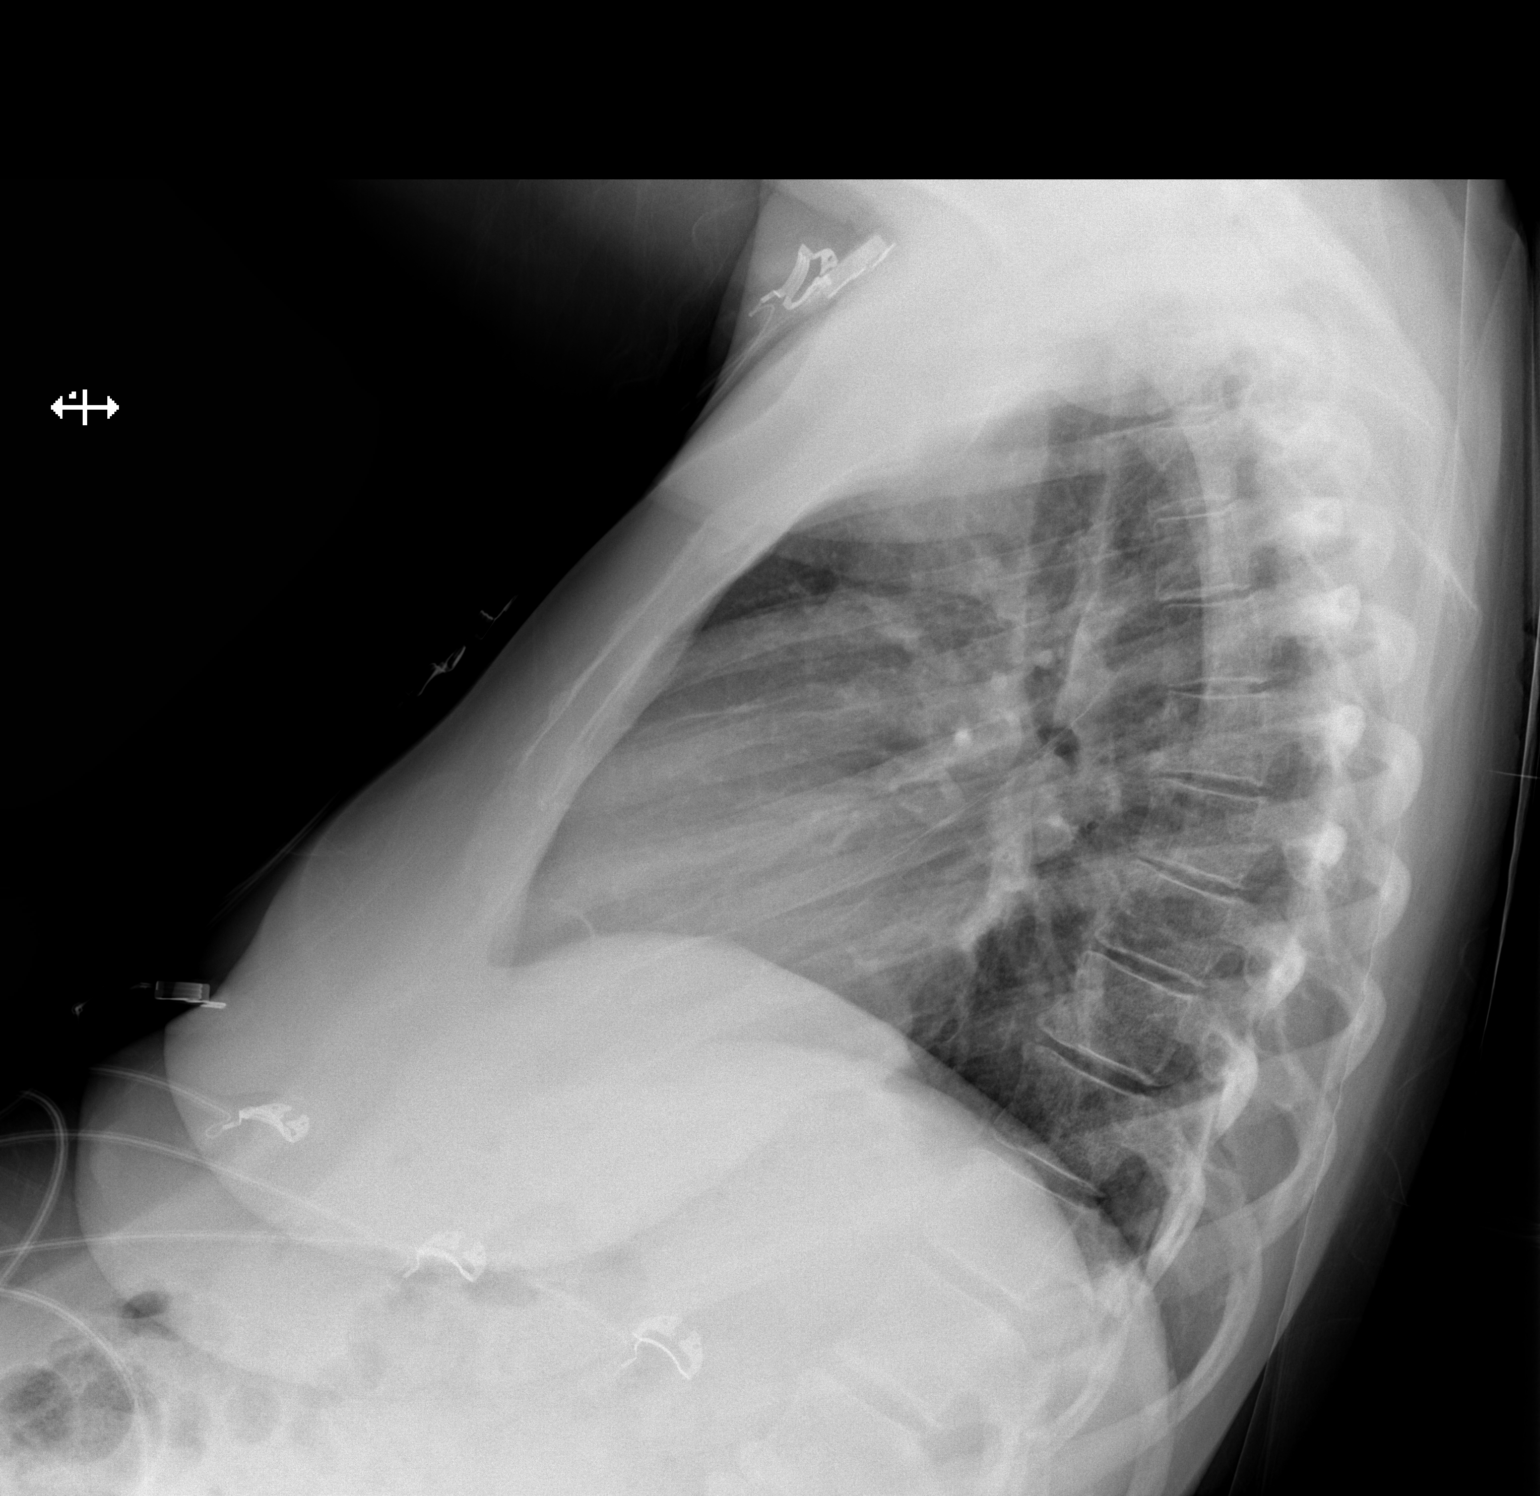

[x chest ap]
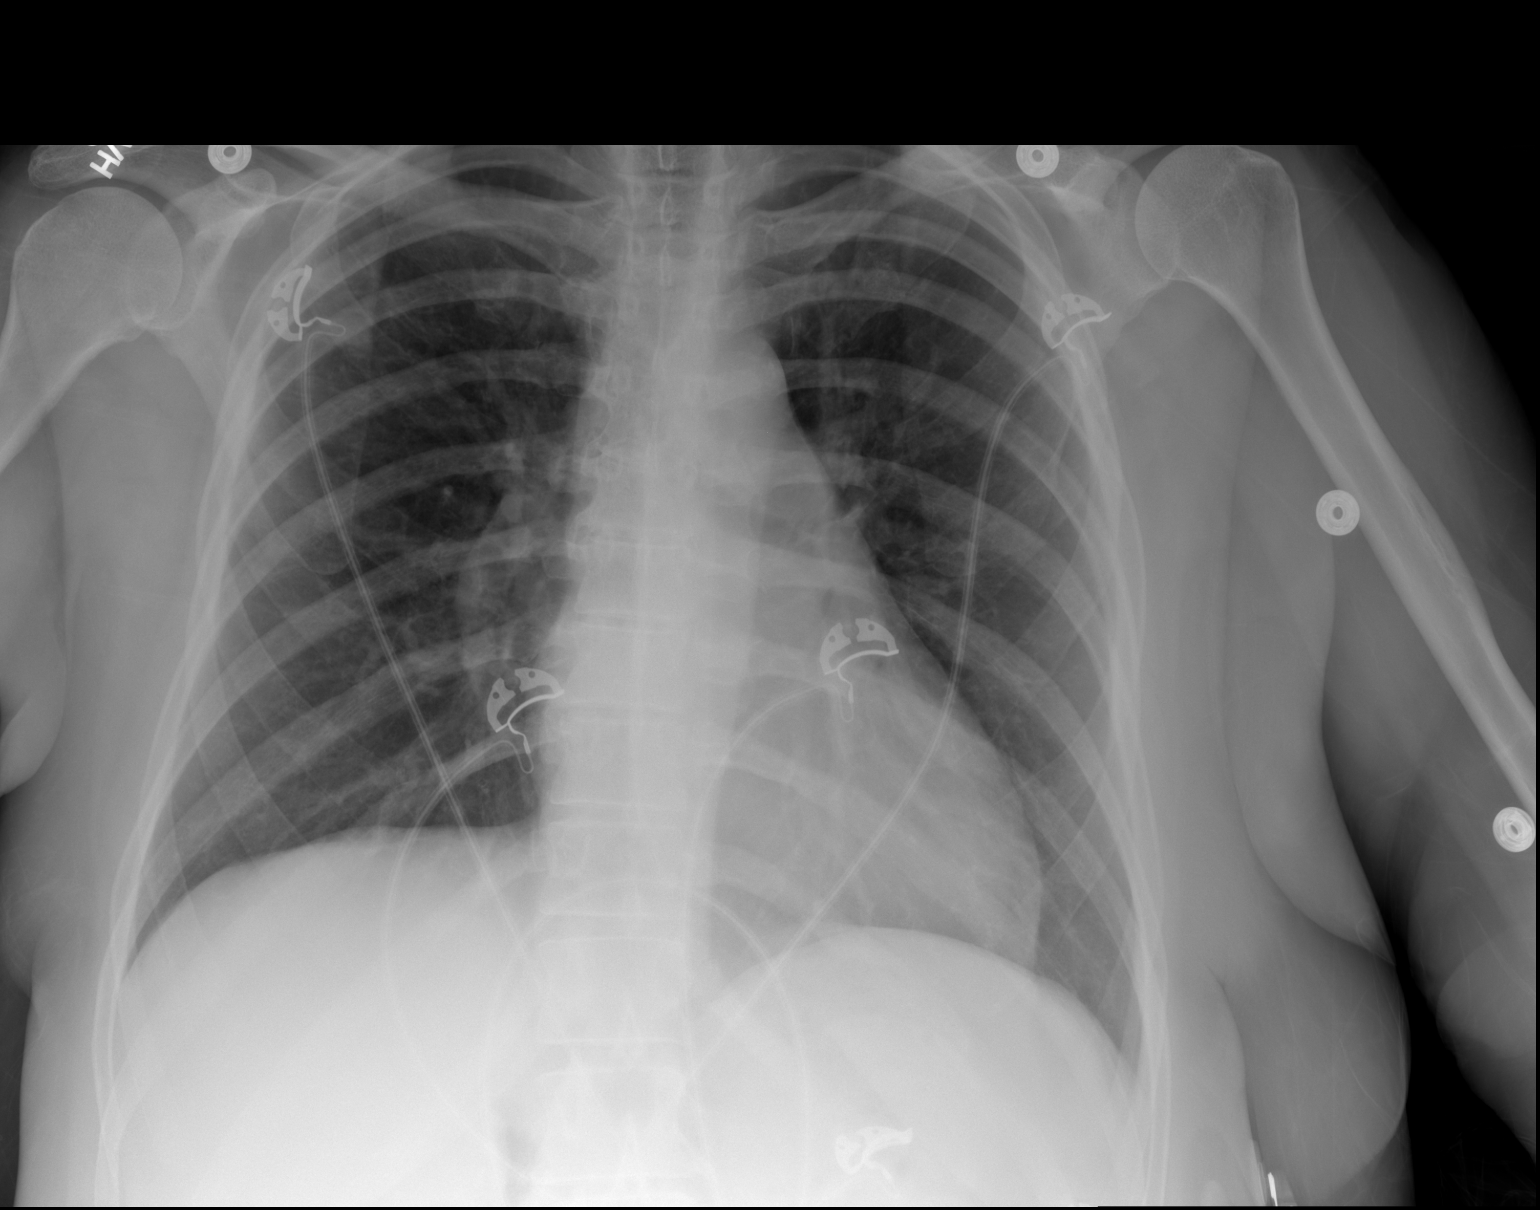

[2 of 2 positions shown; findings below may reference images not displayed]

FINDINGS: The heart size and mediastinal contours are within normal limits.
Both lungs are clear. The visualized skeletal structures are
unremarkable.
IMPRESSION: No active cardiopulmonary disease.

## 2017-08-18 ENCOUNTER — Emergency Department
Admission: EM | Admit: 2017-08-18 | Discharge: 2017-08-19 | Disposition: A | Discharge status: Home / Self Care | Payer: Medicare Other | Attending: Emergency Medicine | Admitting: Emergency Medicine

## 2017-08-18 ENCOUNTER — Encounter: Payer: Self-pay | Admitting: Medical Oncology

## 2017-08-18 DIAGNOSIS — Z79899 Other long term (current) drug therapy: Secondary | ICD-10-CM | POA: Diagnosis not present

## 2017-08-18 DIAGNOSIS — J449 Chronic obstructive pulmonary disease, unspecified: Secondary | ICD-10-CM | POA: Insufficient documentation

## 2017-08-18 DIAGNOSIS — N3001 Acute cystitis with hematuria: Secondary | ICD-10-CM

## 2017-08-18 DIAGNOSIS — F29 Unspecified psychosis not due to a substance or known physiological condition: Secondary | ICD-10-CM

## 2017-08-18 DIAGNOSIS — F17210 Nicotine dependence, cigarettes, uncomplicated: Secondary | ICD-10-CM | POA: Insufficient documentation

## 2017-08-18 DIAGNOSIS — R4689 Other symptoms and signs involving appearance and behavior: Secondary | ICD-10-CM | POA: Diagnosis present

## 2017-08-18 DIAGNOSIS — I10 Essential (primary) hypertension: Secondary | ICD-10-CM | POA: Insufficient documentation

## 2017-08-18 DIAGNOSIS — F259 Schizoaffective disorder, unspecified: Secondary | ICD-10-CM | POA: Insufficient documentation

## 2017-08-18 DIAGNOSIS — E039 Hypothyroidism, unspecified: Secondary | ICD-10-CM | POA: Diagnosis not present

## 2017-08-18 DIAGNOSIS — E162 Hypoglycemia, unspecified: Secondary | ICD-10-CM

## 2017-08-18 DIAGNOSIS — Z7982 Long term (current) use of aspirin: Secondary | ICD-10-CM | POA: Diagnosis not present

## 2017-08-18 DIAGNOSIS — Z794 Long term (current) use of insulin: Secondary | ICD-10-CM | POA: Diagnosis not present

## 2017-08-18 LAB — CBC WITH DIFFERENTIAL/PLATELET
BASOS ABS: 0.1 10*3/uL (ref 0–0.1)
Basophils Relative: 0 %
EOS ABS: 0.1 10*3/uL (ref 0–0.7)
Eosinophils Relative: 1 %
HCT: 39.5 % (ref 35.0–47.0)
HEMOGLOBIN: 13.1 g/dL (ref 12.0–16.0)
LYMPHS ABS: 1.8 10*3/uL (ref 1.0–3.6)
LYMPHS PCT: 10 %
MCH: 31.1 pg (ref 26.0–34.0)
MCHC: 33.1 g/dL (ref 32.0–36.0)
MCV: 94.1 fL (ref 80.0–100.0)
Monocytes Absolute: 1 10*3/uL — ABNORMAL HIGH (ref 0.2–0.9)
Monocytes Relative: 6 %
NEUTROS PCT: 83 %
Neutro Abs: 14.1 10*3/uL — ABNORMAL HIGH (ref 1.4–6.5)
Platelets: 301 10*3/uL (ref 150–440)
RBC: 4.2 MIL/uL (ref 3.80–5.20)
RDW: 14.8 % — ABNORMAL HIGH (ref 11.5–14.5)
WBC: 17 10*3/uL — AB (ref 3.6–11.0)

## 2017-08-18 LAB — URINALYSIS, COMPLETE (UACMP) WITH MICROSCOPIC
Bilirubin Urine: NEGATIVE
Glucose, UA: NEGATIVE mg/dL
HGB URINE DIPSTICK: NEGATIVE
Ketones, ur: NEGATIVE mg/dL
Nitrite: POSITIVE — AB
PROTEIN: NEGATIVE mg/dL
RBC / HPF: NONE SEEN RBC/hpf (ref 0–5)
SQUAMOUS EPITHELIAL / LPF: NONE SEEN
Specific Gravity, Urine: 1.013 (ref 1.005–1.030)
pH: 5 (ref 5.0–8.0)

## 2017-08-18 LAB — COMPREHENSIVE METABOLIC PANEL
ALK PHOS: 73 U/L (ref 38–126)
ALT: 28 U/L (ref 14–54)
AST: 58 U/L — AB (ref 15–41)
Albumin: 4 g/dL (ref 3.5–5.0)
Anion gap: 14 (ref 5–15)
BUN: 14 mg/dL (ref 6–20)
CO2: 24 mmol/L (ref 22–32)
CREATININE: 0.55 mg/dL (ref 0.44–1.00)
Calcium: 9.3 mg/dL (ref 8.9–10.3)
Chloride: 98 mmol/L — ABNORMAL LOW (ref 101–111)
GFR calc Af Amer: >60 mL/min (ref >60)
GFR calc non Af Amer: >60 mL/min (ref >60)
GLUCOSE: 93 mg/dL (ref 65–99)
Potassium: 3.2 mmol/L — ABNORMAL LOW (ref 3.5–5.1)
SODIUM: 136 mmol/L (ref 135–145)
Total Bilirubin: 0.7 mg/dL (ref 0.3–1.2)
Total Protein: 6.7 g/dL (ref 6.5–8.1)

## 2017-08-18 LAB — TSH: TSH: 2.295 u[IU]/mL (ref 0.350–4.500)

## 2017-08-18 LAB — GLUCOSE, CAPILLARY
Glucose-Capillary: 104 mg/dL — ABNORMAL HIGH (ref 65–99)
Glucose-Capillary: 164 mg/dL — ABNORMAL HIGH (ref 65–99)
Glucose-Capillary: 191 mg/dL — ABNORMAL HIGH (ref 65–99)

## 2017-08-18 LAB — T4, FREE: FREE T4: 1.02 ng/dL (ref 0.61–1.12)

## 2017-08-18 MED ORDER — OXCARBAZEPINE 300 MG PO TABS
300.0000 mg | ORAL_TABLET | Freq: Two times a day (BID) | ORAL | Status: DC
  Administered 2017-08-18 – 2017-08-19 (×2): 300 mg via ORAL
  Filled 2017-08-18 (×2): qty 1

## 2017-08-18 MED ORDER — ADULT MULTIVITAMIN W/MINERALS CH
1.0000 | ORAL_TABLET | Freq: Every day | ORAL | Status: DC
  Administered 2017-08-18 – 2017-08-19 (×2): 1 via ORAL
  Filled 2017-08-18 (×2): qty 1

## 2017-08-18 MED ORDER — BENZTROPINE MESYLATE 1 MG PO TABS
0.5000 mg | ORAL_TABLET | Freq: Every day | ORAL | Status: DC
  Administered 2017-08-18: 0.5 mg via ORAL
  Filled 2017-08-18: qty 1

## 2017-08-18 MED ORDER — HALOPERIDOL LACTATE 5 MG/ML IJ SOLN
5.0000 mg | Freq: Once | INTRAMUSCULAR | Status: AC
  Administered 2017-08-18: 5 mg via INTRAMUSCULAR
  Filled 2017-08-18: qty 1

## 2017-08-18 MED ORDER — FUROSEMIDE 40 MG PO TABS
20.0000 mg | ORAL_TABLET | Freq: Every day | ORAL | Status: DC
  Administered 2017-08-19: 20 mg via ORAL
  Filled 2017-08-18: qty 1

## 2017-08-18 MED ORDER — DONEPEZIL HCL 5 MG PO TABS
5.0000 mg | ORAL_TABLET | Freq: Every day | ORAL | Status: DC
  Administered 2017-08-18: 5 mg via ORAL
  Filled 2017-08-18: qty 1

## 2017-08-18 MED ORDER — OLANZAPINE 10 MG IM SOLR
5.0000 mg | Freq: Two times a day (BID) | INTRAMUSCULAR | Status: DC | PRN
  Filled 2017-08-18: qty 10

## 2017-08-18 MED ORDER — ALBUTEROL SULFATE (2.5 MG/3ML) 0.083% IN NEBU
3.0000 mL | INHALATION_SOLUTION | Freq: Four times a day (QID) | RESPIRATORY_TRACT | Status: DC | PRN
  Administered 2017-08-19: 3 mL via RESPIRATORY_TRACT
  Filled 2017-08-18: qty 3

## 2017-08-18 MED ORDER — SODIUM CHLORIDE 0.9 % IV SOLN
1.0000 g | Freq: Once | INTRAVENOUS | Status: AC
  Administered 2017-08-18: 1 g via INTRAVENOUS
  Filled 2017-08-18: qty 10

## 2017-08-18 MED ORDER — TIOTROPIUM BROMIDE MONOHYDRATE 18 MCG IN CAPS
18.0000 ug | ORAL_CAPSULE | Freq: Every day | RESPIRATORY_TRACT | Status: DC
  Administered 2017-08-18 – 2017-08-19 (×2): 18 ug via RESPIRATORY_TRACT
  Filled 2017-08-18 (×2): qty 5

## 2017-08-18 MED ORDER — LEVOTHYROXINE SODIUM 88 MCG PO TABS
88.0000 ug | ORAL_TABLET | Freq: Every day | ORAL | Status: DC
  Administered 2017-08-19: 88 ug via ORAL
  Filled 2017-08-18: qty 1

## 2017-08-18 MED ORDER — ZOLPIDEM TARTRATE 5 MG PO TABS
5.0000 mg | ORAL_TABLET | Freq: Every day | ORAL | Status: DC
  Administered 2017-08-19: 5 mg via ORAL
  Filled 2017-08-18: qty 1

## 2017-08-18 MED ORDER — OLANZAPINE 5 MG PO TABS
5.0000 mg | ORAL_TABLET | Freq: Every day | ORAL | Status: DC
  Administered 2017-08-18: 5 mg via ORAL
  Filled 2017-08-18: qty 1

## 2017-08-18 MED ORDER — FLUPHENAZINE HCL 10 MG PO TABS
10.0000 mg | ORAL_TABLET | Freq: Two times a day (BID) | ORAL | Status: DC
  Filled 2017-08-18 (×2): qty 1

## 2017-08-18 MED ORDER — FAMOTIDINE 20 MG PO TABS
20.0000 mg | ORAL_TABLET | Freq: Two times a day (BID) | ORAL | Status: DC
  Administered 2017-08-18 – 2017-08-19 (×3): 20 mg via ORAL
  Filled 2017-08-18 (×3): qty 1

## 2017-08-18 MED ORDER — LURASIDONE HCL 40 MG PO TABS
20.0000 mg | ORAL_TABLET | Freq: Every day | ORAL | Status: DC
  Administered 2017-08-18 – 2017-08-19 (×2): 20 mg via ORAL
  Filled 2017-08-18 (×2): qty 1

## 2017-08-18 MED ORDER — SODIUM CHLORIDE 1 G PO TABS
1.0000 g | ORAL_TABLET | Freq: Two times a day (BID) | ORAL | Status: DC
  Administered 2017-08-18 – 2017-08-19 (×2): 1 g via ORAL
  Filled 2017-08-18 (×3): qty 1

## 2017-08-18 MED ORDER — ZIPRASIDONE MESYLATE 20 MG IM SOLR
INTRAMUSCULAR | Status: AC
  Administered 2017-08-18: 20 mg via INTRAMUSCULAR
  Filled 2017-08-18: qty 20

## 2017-08-18 MED ORDER — LORAZEPAM 2 MG/ML IJ SOLN
2.0000 mg | Freq: Once | INTRAMUSCULAR | Status: AC
  Administered 2017-08-18: 2 mg via INTRAMUSCULAR
  Filled 2017-08-18: qty 1

## 2017-08-18 MED ORDER — FLUPHENAZINE HCL 5 MG PO TABS
10.0000 mg | ORAL_TABLET | Freq: Three times a day (TID) | ORAL | Status: DC
  Administered 2017-08-18 – 2017-08-19 (×3): 10 mg via ORAL
  Filled 2017-08-18 (×5): qty 2

## 2017-08-18 MED ORDER — ATORVASTATIN CALCIUM 20 MG PO TABS
40.0000 mg | ORAL_TABLET | Freq: Every day | ORAL | Status: DC
Start: 2017-08-18 — End: 2017-08-19
  Administered 2017-08-18 – 2017-08-19 (×2): 40 mg via ORAL
  Filled 2017-08-18 (×2): qty 2

## 2017-08-18 MED ORDER — DEXTROSE-NACL 5-0.9 % IV SOLN: Freq: Once | INTRAVENOUS | Status: DC

## 2017-08-18 MED ORDER — LISINOPRIL 5 MG PO TABS
5.0000 mg | ORAL_TABLET | Freq: Every day | ORAL | Status: DC
  Administered 2017-08-19: 5 mg via ORAL
  Filled 2017-08-18: qty 1

## 2017-08-18 MED ORDER — METFORMIN HCL 500 MG PO TABS
1000.0000 mg | ORAL_TABLET | Freq: Two times a day (BID) | ORAL | Status: DC
  Administered 2017-08-19: 1000 mg via ORAL
  Filled 2017-08-18: qty 2

## 2017-08-18 MED ORDER — CEPHALEXIN 500 MG PO CAPS
500.0000 mg | ORAL_CAPSULE | Freq: Three times a day (TID) | ORAL | Status: DC
  Administered 2017-08-19 (×2): 500 mg via ORAL
  Filled 2017-08-18 (×2): qty 1

## 2017-08-18 MED ORDER — POTASSIUM CHLORIDE CRYS ER 20 MEQ PO TBCR
10.0000 meq | EXTENDED_RELEASE_TABLET | Freq: Every day | ORAL | Status: DC
  Administered 2017-08-18 – 2017-08-19 (×2): 10 meq via ORAL
  Filled 2017-08-18 (×2): qty 1

## 2017-08-18 MED ORDER — ACETAMINOPHEN 325 MG PO TABS: 325.0000 mg | ORAL_TABLET | Freq: Four times a day (QID) | ORAL | Status: DC | PRN

## 2017-08-18 MED ORDER — ZIPRASIDONE MESYLATE 20 MG IM SOLR
20.0000 mg | Freq: Once | INTRAMUSCULAR | Status: AC
  Administered 2017-08-18: 20 mg via INTRAMUSCULAR

## 2017-08-18 NOTE — ED Notes (Signed)
Drenda FreezeFran Lodzinski called. Is pt's sister and legal guardian. Cell: [phone]. Asked her to fax papers confirming legal guardianship to us so we can document this appropriately.

## 2017-08-18 NOTE — ED Notes (Signed)
SOC  DONE  REPORT  GIVEN  GIVEN  TO MD

## 2017-08-18 NOTE — ED Notes (Signed)
SOC in progress.  

## 2017-08-18 NOTE — ED Notes (Signed)
CBG 164  
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
## 2017-08-18 NOTE — ED Provider Notes (Signed)
[facility] Emergency Department Provider Note  ____________________________________________  Time seen: Approximately 9:28 AM  I have reviewed the triage vital signs and the nursing notes.   HISTORY  Chief Complaint Hypoglycemia and Aggressive Behavior  Level 5 caveat:  Portions of the history and physical were unable to be obtained due to AMS   HPI Carrie Clark is a 57 y.o. female with a history of schizoaffective disorder, hypothyroidism, CVA on ASA only, HTN, COPD who presents for evaluation of aggressive behavior and hypoglycemic. Patient coming from [facility] via EMS for aggressive behavior, banging her head on the wall, talking about Jesus and people trying to kill her. Patient very agitated in route. Found per EMS to be hypoglycemic. She is on levemir and metformin. Seems to be eating normally but has not had any breakfast today. Patient given versed per EMS and [place]. BG on arrival 104. Patient very teary and agitated. Does not answer any questions.   Past Medical History:  Diagnosis Date  . COPD (chronic obstructive pulmonary disease) (HCC)   . CVA (cerebral infarction)   . Depression   . Hypertension   . Hypothyroidism   . Schizophrenia (HCC)   . Stroke (HCC)   . Tardive dyskinesia     Patient Active Problem List   Diagnosis Date Noted  . Schizoaffective disorder, bipolar type (HCC) 09/11/2015  . Aggression   . Episode of behavior change   . Psychoses (HCC)     History reviewed. No pertinent surgical history.  Prior to Admission medications   Medication Sig Start Date End Date Taking? Authorizing Provider  acetaminophen (TYLENOL) 325 MG tablet Take 325 mg by mouth every 6 (six) hours as needed for moderate pain.   Yes [provider]  albuterol (PROVENTIL HFA;VENTOLIN HFA) 108 (90 Base) MCG/ACT inhaler Inhale 2 puffs into the lungs every 6 (six) hours as needed for wheezing or shortness of breath. 06/07/15   Yes Mesner, Barbara Cower, MD  atorvastatin (LIPITOR) 40 MG tablet Take 40 mg by mouth daily.    Yes [provider]  benztropine (COGENTIN) 0.5 MG tablet Take 0.5 mg by mouth at bedtime.   Yes [provider]  cetirizine (ZYRTEC) 10 MG tablet Take 1 tablet (10 mg total) by mouth daily. 11/11/14  Yes Withrow, Everardo All, FNP  donepezil (ARICEPT) 5 MG tablet Take 5 mg by mouth at bedtime.   Yes [provider]  famotidine (PEPCID) 20 MG tablet Take 1 tablet (20 mg total) by mouth 2 (two) times daily. 11/11/14  Yes Withrow, Everardo All, FNP  fluPHENAZine (PROLIXIN) 10 MG tablet Take 1 tablet (10 mg total) by mouth at bedtime. Patient taking differently: Take 10 mg by mouth 3 (three) times daily.  11/11/14  Yes Withrow, Everardo All, FNP  fluticasone (FLONASE) 50 MCG/ACT nasal spray Place 50 sprays into both nostrils 2 (two) times daily as needed. Patient taking differently: Place 2 sprays into both nostrils every morning.  11/11/14  Yes Withrow, Everardo All, FNP  furosemide (LASIX) 20 MG tablet Take 1 tablet (20 mg total) by mouth daily. 11/11/14  Yes Withrow, John C, FNP  glucose blood test strip 1 each by Other route every morning. Use as instructed for blood sugar monitoring each morning before meals   Yes [provider]  guaiFENesin (ROBITUSSIN) 100 MG/5ML SOLN Take 10 mLs by mouth every 4 (four) hours as needed for cough or to loosen phlegm.   Yes [provider]  Insulin Detemir (  LEVEMIR) 100 UNIT/ML Pen Inject 18 Units into the skin 2 (two) times daily. Before breakfast and dinner   Yes [provider]  levothyroxine (SYNTHROID, LEVOTHROID) 88 MCG tablet Take 88 mcg by mouth daily before breakfast.   Yes [provider]  lisinopril (PRINIVIL,ZESTRIL) 5 MG tablet Take 5 mg by mouth daily.   Yes [provider]  LORazepam (ATIVAN) 1 MG tablet Take 1 tablet (1 mg total) by mouth every 8 (eight) hours as needed for anxiety (agitation). Patient taking  differently: Take 1 mg by mouth 2 (two) times daily as needed for anxiety (agitation).  09/11/15  Yes Lord, Herminio HeadsJamison Y, NP  lurasidone (LATUDA) 20 MG TABS tablet Take 20 mg by mouth daily. Take 20 mg PO at 1700 with a meal   Yes [provider]  metFORMIN (GLUCOPHAGE) 1000 MG tablet Take 1 tablet (1,000 mg total) by mouth 2 (two) times daily. 09/11/15  Yes Charm RingsLord, Jamison Y, NP  mirtazapine (REMERON) 15 MG tablet Take 15 mg by mouth daily.    Yes [provider]  Multiple Vitamins-Minerals (MULTIVITAMIN WITH MINERALS) tablet Take 1 tablet by mouth daily. 11/11/14  Yes Withrow, Everardo AllJohn C, FNP  ondansetron (ZOFRAN-ODT) 4 MG disintegrating tablet Take 4 mg by mouth 3 (three) times daily as needed for nausea or vomiting.    Yes [provider]  Oxcarbazepine (TRILEPTAL) 300 MG tablet Take 1 tablet (300 mg total) by mouth 2 (two) times daily. 09/11/15  Yes Lord, Herminio HeadsJamison Y, NP  potassium chloride (K-DUR,KLOR-CON) 10 MEQ tablet Take 1 tablet (10 mEq total) by mouth daily. 11/11/14  Yes Withrow, Everardo AllJohn C, FNP  tiotropium (SPIRIVA) 18 MCG inhalation capsule Place 18 mcg into inhaler and inhale daily.   Yes [provider]  zolpidem (AMBIEN) 5 MG tablet Take 5 mg by mouth at bedtime.   Yes [provider]  cephALEXin (KEFLEX) 500 MG capsule Take 1 capsule (500 mg total) by mouth every 6 (six) hours. Patient not taking: Reported on 10/10/2015 09/11/15   Charm RingsLord, Jamison Y, NP  levothyroxine (SYNTHROID, LEVOTHROID) 50 MCG tablet Take 1 tablet (50 mcg total) by mouth daily. Patient not taking: Reported on 08/18/2017 11/11/14   Beau FannyWithrow, John C, FNP  sodium chloride 1 g tablet Take 1 tablet (1 g total) by mouth 2 (two) times daily. 01/20/17   Kellie ShropshireShrosbree, Emily J, PA-C  losartan (COZAAR) 25 MG tablet Take 1 tablet (25 mg total) by mouth daily. Patient not taking: Reported on 12/09/2014 11/11/14 06/07/15  Beau FannyWithrow, John C, FNP    Allergies Fish allergy  Family History  Family history  unknown: Yes    Social History Social History   Tobacco Use  . Smoking status: Current Every Day Smoker    Packs/day: 1.00    Types: Cigarettes  . Smokeless tobacco: Never Used  Substance Use Topics  . Alcohol use: No  . Drug use: No    Review of Systems  Constitutional: Negative for fever. Respiratory: Negative for cough, congestion Gastrointestinal: Negative for vomiting or diarrhea. Psych: + agitation  Level 5 caveat:  Portions of the history and physical were unable to be obtained due to AMS   ____________________________________________   PHYSICAL EXAM:  VITAL SIGNS: ED Triage Vitals  Enc Vitals Group     BP 08/18/17 0907 (!) 164/100     Pulse Rate 08/18/17 0907 84     Resp 08/18/17 0907 20     Temp 08/18/17 0907 (!) 97.2 F (36.2 C)  Temp Source 08/18/17 0907 Oral     SpO2 08/18/17 0907 97 %     Weight 08/18/17 0908 130 lb (59 kg)     Height 08/18/17 0908 5\' 4"  (1.626 m)     Head Circumference --      Peak Flow --      Pain Score 08/18/17 0908 10     Pain Loc --      Pain Edu? --      Excl. in GC? --     Constitutional: Crying, agitated. HEENT:      Head: Normocephalic and atraumatic.         Eyes: Conjunctivae are normal. Sclera is non-icteric.       Mouth/Throat: Mucous membranes are moist.       Neck: Supple with no signs of meningismus. Cardiovascular: Regular rate and rhythm. No murmurs, gallops, or rubs. 2+ symmetrical distal pulses are present in all extremities. No JVD. Respiratory: Normal respiratory effort. Lungs are clear to auscultation bilaterally. No wheezes, crackles, or rhonchi.  Gastrointestinal: Soft, non tender, and non distended with positive bowel sounds. No rebound or guarding. Musculoskeletal: Nontender with normal range of motion in all extremities. No edema, cyanosis, or erythema of extremities. Neurologic: Normal speech, face is symmetric, moving all extremities Skin: Skin is warm, dry and intact. No rash  noted. Psychiatric: Mood and affect are depressed, crying, agitated, talking about Jesus, asking for a bible, talking about people trying to kill her, people owing her millions. ____________________________________________   LABS (all labs ordered are listed, but only abnormal results are displayed)  Labs Reviewed  CBC WITH DIFFERENTIAL/PLATELET - Abnormal; Notable for the following components:      Result Value   WBC 17.0 (*)    RDW 14.8 (*)    Neutro Abs 14.1 (*)    Monocytes Absolute 1.0 (*)    All other components within normal limits  COMPREHENSIVE METABOLIC PANEL - Abnormal; Notable for the following components:   Potassium 3.2 (*)    Chloride 98 (*)    AST 58 (*)    All other components within normal limits  URINALYSIS, COMPLETE (UACMP) WITH MICROSCOPIC - Abnormal; Notable for the following components:   Color, Urine YELLOW (*)    APPearance HAZY (*)    Nitrite POSITIVE (*)    Leukocytes, UA TRACE (*)    Bacteria, UA RARE (*)    All other components within normal limits  GLUCOSE, CAPILLARY - Abnormal; Notable for the following components:   Glucose-Capillary 104 (*)    All other components within normal limits  GLUCOSE, CAPILLARY - Abnormal; Notable for the following components:   Glucose-Capillary 164 (*)    All other components within normal limits  GLUCOSE, CAPILLARY - Abnormal; Notable for the following components:   Glucose-Capillary 191 (*)    All other components within normal limits  URINE CULTURE  TSH  T4, FREE  CBG MONITORING, ED   ____________________________________________  EKG  ED ECG REPORT I, Nita Sickle, the attending physician, personally viewed and interpreted this ECG.  Normal sinus rhythm, rate of 87, borderline prolonged QTC, normal axis, no ST elevations or depressions, diffuse T wave flattening. ____________________________________________  RADIOLOGY  none   ____________________________________________   PROCEDURES  Procedure(s) performed: None Procedures Critical Care performed:  None ____________________________________________   INITIAL IMPRESSION / ASSESSMENT AND PLAN / ED COURSE   57 y.o. female with a history of schizoaffective disorder, hypothyroidism, CVA on ASA only, HTN, COPD who presents for evaluation of aggressive  behavior and hypoglycemic.  Patient is on Levemir.  Has not eaten today.  She is very agitated, crying, altered.  Vitals are within normal limits.  She is neurologically intact.  Blood glucose here was 109.  Will feed patient and check a blood glucose q. 30 minutes for the next 2 hours.  Will check basic labs for any signs of infection, dehydration, or electrolyte abnormalities.  No signs or symptoms of stroke or meningitis.  Clinical Course as of Aug 19 1427  Mon Aug 18, 2017  1048 Patient started to become extremely agitated again, getting up from bed.  At this time she is a risk to herself and staff.  She will be given 20 mg of IM Geodon.  She is connected to telemetry.  Her repeat glucose is 134.  She had breakfast.. Labs showing leukocytosis with WBC 17. UA pending. No fever, no signs of sepsis. Plan to repeat BG in 30 min and if stable, patient will be cleared for psych evaluation. IVC papers taken.   [CV]    Clinical Course User Index [CV] Don Perking [place], MD   _________________________ 2:28 PM on 08/18/2017 -----------------------------------------  UA positive for UTI. WBC elevated but otherwise no signs of sepsis. Patient given rocephin and then keflex TID for 6 days. Culture pending. Evaluated by psych who recommended inpatient placement. TTS consulted for placement. Meds have been ordered.  As part of my medical decision making, I reviewed the following data within the electronic MEDICAL RECORD NUMBER Nursing notes reviewed and incorporated, Labs reviewed , EKG interpreted , A consult was requested and  obtained from this/these consultant(s) Psychiatry, Notes from prior ED visits and [place] Controlled Substance Database    Pertinent labs & imaging results that were available during my care of the patient were reviewed by me and considered in my medical decision making (see chart for details).    ____________________________________________   FINAL CLINICAL IMPRESSION(S) / ED DIAGNOSES  Final diagnoses:  Psychosis, unspecified psychosis type (HCC)  Acute cystitis with hematuria  Hypoglycemia      NEW MEDICATIONS STARTED DURING THIS VISIT:  ED Discharge Orders    None       Note:  This document was prepared using Dragon voice recognition software and may include unintentional dictation errors.    Don Perking, [place], MD 08/18/17 [phone]

## 2017-08-18 NOTE — ED Notes (Signed)
ENVIRONMENTAL ASSESSMENT Potentially harmful objects out of patient reach: Yes.   Personal belongings secured: Yes.   Patient dressed in hospital provided attire only: Yes.   Plastic bags out of patient reach: Yes.   Patient care equipment (cords, cables, call bells, lines, and drains) shortened, removed, or accounted for: No. Equipment and supplies removed from bottom of stretcher: Yes.   Potentially toxic materials out of patient reach: Yes.   Sharps container removed or out of patient reach: Yes.   

## 2017-08-18 NOTE — ED Notes (Signed)
SOC machine set up in pt room.  

## 2017-08-18 NOTE — ED Notes (Signed)
PT IVC/SOC COMPLETE/RECOMMENDS PLACEMENT. 

## 2017-08-18 NOTE — ED Notes (Signed)
CBG 191 

## 2017-08-18 NOTE — ED Notes (Signed)
BEHAVIORAL HEALTH ROUNDING Patient sleeping: Yes.   Patient alert and oriented: no Behavior appropriate: No.; If no, describe: delusional and paranoid when awake Nutrition and fluids offered: Yes  Toileting and hygiene offered: Yes  Sitter present: not applicable Law enforcement present: Yes

## 2017-08-18 NOTE — ED Triage Notes (Signed)
[facility] to [facility] with reports from staff there that pt was being aggressive and banging her head against the wall and punching things. EMS checked CBG and noted it to be 38. Pt refused to take oral glucose but drank a Mt dew. Receck revealed 65. Pt continued with aggressive behavior so 5MG  of IM Versed was given pta. Pt tearful upon arrival, yelling and appears to be having visual and auditory hallucinations.

## 2017-08-18 NOTE — ED Notes (Signed)
IVC PAPERS INITIATED BY MD VERONESE/[employer]S SECURITY AND RN HEATHER MADE AWARE.

## 2017-08-18 NOTE — ED Notes (Signed)
PT IVC PENDING SOC CONSULT/SOC CALLED. 

## 2017-08-18 NOTE — ED Notes (Signed)
Carrie SprangDebra Clark [phone] called to check on patient, call this number for updates.

## 2017-08-18 NOTE — ED Notes (Signed)
Patient resting quietly. Awakened for mini cath. Patient calm for cath. Has been incontinent of stool and washed and patient thanks nurse for that. After procedure, patient starts talking about grandmother trying to harm her and crying. Lights down, patient covered with blanket, continues to talk about things she is afraid of but resting on bed. Has been medicated prior to my receiving patient. Will observe to see if patient calms.

## 2017-08-18 NOTE — ED Notes (Signed)
Lunch meal provided to pt. Pt stated she did not want staff to open her juice.

## 2017-08-18 NOTE — ED Notes (Signed)
Pt changed out into burgundy scrubs. Cooperative through changing process. Pt clothes placed in pt belongings bag. Purple sheets, gray socks, gray/black clothes, jeans. 1 pt belonging bag.

## 2017-08-18 NOTE — ED Notes (Signed)
Pt cooperative with this RN. Pt keeps yelling and screaming out. Yelling does not seem to be directed at staff members but at voices pt is hearing. Pt screaming is high-pitched. Unable to understand much of what pt says while yelling. Denies SI or HI.

## 2017-08-18 NOTE — ED Notes (Signed)
Idell PicklesSandra Holloway- caretaker from [facility]- called to check on pt. Informed pt is sleeping at this time. Stated she would call back later.

## 2017-08-19 DIAGNOSIS — F29 Unspecified psychosis not due to a substance or known physiological condition: Secondary | ICD-10-CM | POA: Diagnosis not present

## 2017-08-19 MED ORDER — CEPHALEXIN 500 MG PO CAPS: 500.0000 mg | ORAL_CAPSULE | Freq: Four times a day (QID) | ORAL | 0 refills | Status: AC

## 2017-08-19 NOTE — ED Notes (Signed)
Transportation unable to pick up patient until 1900.

## 2017-08-19 NOTE — ED Notes (Signed)

## 2017-08-19 NOTE — ED Notes (Signed)
Ate all of dinner.

## 2017-08-19 NOTE — ED Notes (Signed)
Patient came out of room saying she was hungry and wanted to go to the cafeteria - I redirected her back to her room with a meal tray and chocolate milk

## 2017-08-19 NOTE — ED Notes (Signed)
Spoke with pt sister Drenda FreezeFran on the phone, per pt request, updated her the pt current status. Sister states she is going to try to come see her sometime today.

## 2017-08-19 NOTE — Discharge Instructions (Addendum)
it looks like a urinary tract infection was making her much worse. She is now better. Please continue the Keflex antibiotic one pill 4 times a day till it done. Please return for any further problems. is continue the rest of her medications and follow-up with her doctors.

## 2017-08-19 NOTE — ED Notes (Signed)

## 2017-08-19 NOTE — ED Notes (Addendum)
Spoke with June LeapFran Lodzinski, patient legal guardian about plan for discharge. Agreeable with plan to discharge back to family care home.

## 2017-08-19 NOTE — ED Notes (Addendum)
Dr. Loretha BrasilHulkower [phone].  Verbal orders to hold 1600 prolixin.  States that patient is appropriate to be discharged back to group home and that he will reverse IVC paperwork. He did speak with legal guardian, Drenda FreezeFran- patient sister.

## 2017-08-19 NOTE — ED Notes (Signed)
Spoke with Gavin Poundeborah at [facility].

## 2017-08-19 NOTE — ED Notes (Addendum)
[facility]; TV set up at bedside.

## 2017-08-19 NOTE — ED Notes (Signed)
Pt ate 100% of lunch and has had 2 snacks.

## 2017-08-19 NOTE — ED Provider Notes (Signed)
-----------------------------------------   5:58 AM on 08/19/2017 -----------------------------------------   Blood pressure 115/62, pulse (!) 110, temperature 97.8 F (36.6 C), temperature source Oral, resp. rate 20, height 5\' 4"  (1.626 m), weight 59 kg (130 lb), SpO2 91 %.  The patient had no acute events since last update.  Calm and cooperative at this time.  Disposition is pending Psychiatry/Behavioral Medicine team recommendations.     Merrily Brittleifenbark, Anaissa Macfadden, MD 08/19/17 [phone]

## 2017-08-19 NOTE — ED Notes (Signed)
Soc  Called  Per  Dr  Darnelle CatalanMalinda

## 2017-08-19 NOTE — ED Notes (Addendum)
Caregiver from facility here to get patient. Wheeled out via wheelchair.  Left with 1 bag of soiled clothing.  Left with Jory SimsJhandrel Mims

## 2017-08-19 NOTE — ED Notes (Signed)
Pt discharged back [facility]

## 2017-08-19 NOTE — ED Provider Notes (Signed)
patient is doing much better today tele-psychiatry sees the patient again and feels she is okay to go we will discharge her on her antibiotics.   Arnaldo NatalMalinda, Paul F, MD 08/19/17 1410

## 2017-08-19 NOTE — ED Notes (Signed)
Safety sitter at bedside 

## 2017-08-19 NOTE — ED Notes (Signed)
Legal guardian, Drenda FreezeFran aware of discharge plan.

## 2017-08-19 NOTE — ED Notes (Signed)
SOC  DONE  REPORT  GIVEN  TO MD 

## 2017-08-20 LAB — URINE CULTURE: Culture: >=100000 — AB

## 2017-10-13 ENCOUNTER — Ambulatory Visit (INDEPENDENT_AMBULATORY_CARE_PROVIDER_SITE_OTHER): Payer: Medicare Other | Admitting: Podiatry

## 2017-10-13 ENCOUNTER — Encounter: Payer: Self-pay | Admitting: Podiatry

## 2017-10-13 DIAGNOSIS — B351 Tinea unguium: Secondary | ICD-10-CM

## 2017-10-13 DIAGNOSIS — M79676 Pain in unspecified toe(s): Secondary | ICD-10-CM | POA: Diagnosis not present

## 2017-10-13 NOTE — Progress Notes (Signed)
Complaint:  Visit Type: Patient returns to my office for continued preventative foot care services. Complaint: Patient states" my nails have grown long and thick and become painful to walk and wear shoes" . The patient presents for preventative foot care services. No changes to ROS  Podiatric Exam: Vascular: dorsalis pedis and posterior tibial pulses are palpable bilateral. Capillary return is immediate. Temperature gradient is WNL. Skin turgor WNL  Sensorium: Normal Semmes Weinstein monofilament test. Normal tactile sensation bilaterally. Nail Exam: Pt has thick disfigured discolored nails with subungual debris noted bilateral entire nail hallux through fifth toenails Ulcer Exam: There is no evidence of ulcer or pre-ulcerative changes or infection. Orthopedic Exam: Muscle tone and strength are WNL. No limitations in general ROM. No crepitus or effusions noted. Foot type and digits show no abnormalities. Bony prominences are unremarkable. Skin: No Porokeratosis. No infection or ulcers  Diagnosis:  Onychomycosis, , Pain in right toe, pain in left toes  Treatment & Plan Procedures and Treatment: Consent by patient was obtained for treatment procedures.   Debridement of mycotic and hypertrophic toenails, 1 through 5 bilateral and clearing of subungual debris. No ulceration, no infection noted.  Return Visit-Office Procedure: Patient instructed to return to the office for a follow up visit 3 months for continued evaluation and treatment.    Winda Summerall DPM 

## 2018-01-01 ENCOUNTER — Encounter (HOSPITAL_COMMUNITY): Payer: Self-pay

## 2018-01-01 ENCOUNTER — Emergency Department (HOSPITAL_COMMUNITY)
Admission: EM | Admit: 2018-01-01 | Discharge: 2018-01-01 | Disposition: A | Discharge status: Home / Self Care | Payer: Medicare Other | Attending: Emergency Medicine | Admitting: Emergency Medicine

## 2018-01-01 DIAGNOSIS — R1084 Generalized abdominal pain: Secondary | ICD-10-CM | POA: Diagnosis present

## 2018-01-01 DIAGNOSIS — J449 Chronic obstructive pulmonary disease, unspecified: Secondary | ICD-10-CM | POA: Diagnosis not present

## 2018-01-01 DIAGNOSIS — E039 Hypothyroidism, unspecified: Secondary | ICD-10-CM | POA: Insufficient documentation

## 2018-01-01 DIAGNOSIS — F17210 Nicotine dependence, cigarettes, uncomplicated: Secondary | ICD-10-CM | POA: Insufficient documentation

## 2018-01-01 DIAGNOSIS — I10 Essential (primary) hypertension: Secondary | ICD-10-CM | POA: Insufficient documentation

## 2018-01-01 LAB — COMPREHENSIVE METABOLIC PANEL
ALK PHOS: 82 U/L (ref 38–126)
ALT: 20 U/L (ref 0–44)
ANION GAP: 9 (ref 5–15)
AST: 18 U/L (ref 15–41)
Albumin: 3.8 g/dL (ref 3.5–5.0)
BUN: 14 mg/dL (ref 6–20)
CALCIUM: 9.3 mg/dL (ref 8.9–10.3)
CO2: 30 mmol/L (ref 22–32)
Chloride: 98 mmol/L (ref 98–111)
Creatinine, Ser: 0.63 mg/dL (ref 0.44–1.00)
GFR calc Af Amer: >60 mL/min (ref >60)
Glucose, Bld: 53 mg/dL — ABNORMAL LOW (ref 70–99)
Potassium: 3.8 mmol/L (ref 3.5–5.1)
Sodium: 137 mmol/L (ref 135–145)
TOTAL PROTEIN: 6.8 g/dL (ref 6.5–8.1)
Total Bilirubin: 0.4 mg/dL (ref 0.3–1.2)

## 2018-01-01 LAB — URINALYSIS, ROUTINE W REFLEX MICROSCOPIC
Bilirubin Urine: NEGATIVE
Glucose, UA: NEGATIVE mg/dL
Hgb urine dipstick: NEGATIVE
Ketones, ur: NEGATIVE mg/dL
LEUKOCYTES UA: NEGATIVE
NITRITE: NEGATIVE
PROTEIN: NEGATIVE mg/dL
Specific Gravity, Urine: 1 — ABNORMAL LOW (ref 1.005–1.030)
pH: 6 (ref 5.0–8.0)

## 2018-01-01 LAB — CBC
HCT: 39.7 % (ref 36.0–46.0)
HEMOGLOBIN: 12.9 g/dL (ref 12.0–15.0)
MCH: 31.5 pg (ref 26.0–34.0)
MCHC: 32.5 g/dL (ref 30.0–36.0)
MCV: 97.1 fL (ref 78.0–100.0)
Platelets: 294 10*3/uL (ref 150–400)
RBC: 4.09 MIL/uL (ref 3.87–5.11)
RDW: 13.7 % (ref 11.5–15.5)
WBC: 11.8 10*3/uL — AB (ref 4.0–10.5)

## 2018-01-01 LAB — PREGNANCY, URINE: PREG TEST UR: NEGATIVE

## 2018-01-01 LAB — LIPASE, BLOOD: Lipase: 30 U/L (ref 11–51)

## 2018-01-01 NOTE — ED Notes (Signed)
Pt moved to hallway waiting for her ride.

## 2018-01-01 NOTE — ED Triage Notes (Addendum)
Pt brought in by EMS due to increase gas, loose stools and abdominal pain since this morning. Pt says she could be pregnant

## 2018-01-01 NOTE — Discharge Instructions (Signed)

## 2018-01-01 NOTE — ED Notes (Signed)
Patient states her stomach was hurting earlier, is not hurting now. Patient states it was just anxiety and she wants to go home. Non tender to palpation. Denies nausea, vomiting or diarrhea. Patient denies difficulty with urination.

## 2018-01-01 NOTE — ED Notes (Signed)
Pt loudly stating she's going to walk home.  Informed her the facility staff is on the way to pick her up.  Pt still insists on leaving.  Pt willing to wait if meal ordered.  Snack given and meal tray ordered.

## 2018-01-01 NOTE — ED Notes (Signed)
Pt walked up to desk stating she wants to go home states the pain she was having this morning is gone

## 2018-01-01 NOTE — ED Notes (Signed)
Patient will be placed in hallway for safety reasons.

## 2018-01-01 NOTE — ED Provider Notes (Signed)
Emergency Department Provider Note   I have reviewed the triage vital signs and the nursing notes.   HISTORY  Chief Complaint Abdominal Pain   HPI Carrie Clark is a 57 y.o. female with PMH of COPD, CVA, HTN, Schizophrenia, and hypothyroidism presents to the emergency department for evaluation of mid abdominal pain which was present upon waking this morning.  The patient denies any radiation of symptoms or other modifying factors.  She denies any diarrhea or vomiting.  She states that she had a bowel movement and her pain is now completely gone.  She denies any dysuria, hesitancy, urgency.  No fevers or chills.  She does tell staff that she could be pregnant though cannot know me her last menstrual cycle date.    Past Medical History:  Diagnosis Date  . COPD (chronic obstructive pulmonary disease) (HCC)   . CVA (cerebral infarction)   . Depression   . Hypertension   . Hypothyroidism   . Schizophrenia (HCC)   . Stroke (HCC)   . Tardive dyskinesia     Patient Active Problem List   Diagnosis Date Noted  . Schizoaffective disorder, bipolar type (HCC) 09/11/2015  . Aggression   . Episode of behavior change   . Psychoses (HCC)     History reviewed. No pertinent surgical history.  Allergies Fish allergy  Family History  Family history unknown: Yes    Social History Social History   Tobacco Use  . Smoking status: Current Every Day Smoker    Packs/day: 1.00    Types: Cigarettes  . Smokeless tobacco: Never Used  Substance Use Topics  . Alcohol use: No  . Drug use: No    Review of Systems  Constitutional: No fever/chills Eyes: No visual changes. ENT: No sore throat. Cardiovascular: Denies chest pain. Respiratory: Denies shortness of breath. Gastrointestinal: Positive abdominal pain.  No nausea, no vomiting.  No diarrhea.  No constipation. Genitourinary: Negative for dysuria. Musculoskeletal: Negative for back pain. Skin: Negative for rash. Neurological:  Negative for headaches, focal weakness or numbness.  10-point ROS otherwise negative.  ____________________________________________   PHYSICAL EXAM:  VITAL SIGNS: ED Triage Vitals  Enc Vitals Group     BP 01/01/18 1302 109/69     Pulse Rate 01/01/18 1302 96     Resp 01/01/18 1302 18     Temp 01/01/18 1302 97.9 F (36.6 C)     Temp Source 01/01/18 1302 Oral     SpO2 01/01/18 1302 100 %     Weight 01/01/18 1300 145 lb (65.8 kg)     Height 01/01/18 1300 5\' 5"  (1.651 m)     Pain Score 01/01/18 1301 9   Constitutional: Alert. Well appearing and in no acute distress. Eyes: Conjunctivae are normal.  Head: Atraumatic. Nose: No congestion/rhinnorhea. Mouth/Throat: Mucous membranes are moist. Neck: No stridor. Cardiovascular: Normal rate, regular rhythm. Good peripheral circulation. Grossly normal heart sounds.   Respiratory: Normal respiratory effort.  No retractions. Lungs CTAB. Gastrointestinal: Soft and nontender. No distention.  Musculoskeletal: No lower extremity tenderness nor edema. No gross deformities of extremities. Neurologic:  Normal speech and language. No gross focal neurologic deficits are appreciated.  Skin:  Skin is warm, dry and intact. No rash noted.  ____________________________________________   LABS (all labs ordered are listed, but only abnormal results are displayed)  Labs Reviewed  COMPREHENSIVE METABOLIC PANEL - Abnormal; Notable for the following components:      Result Value   Glucose, Bld 53 (*)    All other  components within normal limits  CBC - Abnormal; Notable for the following components:   WBC 11.8 (*)    All other components within normal limits  URINALYSIS, ROUTINE W REFLEX MICROSCOPIC - Abnormal; Notable for the following components:   Color, Urine COLORLESS (*)    Specific Gravity, Urine 1.000 (*)    All other components within normal limits  LIPASE, BLOOD  PREGNANCY, URINE    ____________________________________________  RADIOLOGY  None ____________________________________________   PROCEDURES  Procedure(s) performed:   Procedures  None  ____________________________________________   INITIAL IMPRESSION / ASSESSMENT AND PLAN / ED COURSE  Pertinent labs & imaging results that were available during my care of the patient were reviewed by me and considered in my medical decision making (see chart for details).  Presents to the emergency department with abdominal pain which improved after a bowel movement.  She is at a residential facility and is asking to be discharged.  Her abdomen is completely soft and nontender.  Her blood work has resulted with no acute findings.  I do not see an indication for abdominal imaging at this time.  Urinalysis is pending.  Plan for follow-up on this and likely discharge.  UA negative. Will discharge back to residential facility.   At this time, I do not feel there is any life-threatening condition present. I have reviewed and discussed all results (EKG, imaging, lab, urine as appropriate), exam findings with patient. I have reviewed nursing notes and appropriate previous records.  I feel the patient is safe to be discharged home without further emergent workup. Discussed usual and customary return precautions. Patient and family (if present) verbalize understanding and are comfortable with this plan.  Patient will follow-up with their primary care provider. If they do not have a primary care provider, information for follow-up has been provided to them. All questions have been answered.  ____________________________________________  FINAL CLINICAL IMPRESSION(S) / ED DIAGNOSES  Final diagnoses:  Generalized abdominal pain    Note:  This document was prepared using Dragon voice recognition software and may include unintentional dictation errors.  Alona BeneJoshua Achillies Buehl, MD Emergency Medicine    Cheria Sadiq, Arlyss RepressJoshua G, MD 01/01/18  [phone]

## 2018-01-01 NOTE — ED Notes (Signed)
Awaiting facility transport. Patient not safe to place in waiting area.

## 2018-01-01 NOTE — ED Notes (Signed)
Called [facility] and notifed of pt wanting to leave. Requests we tell her she needs to stay and be evaluated . Pt states she ok then returns to triage yelling she is ok and wants to leave. Pt is talking to director and she is going to stay

## 2018-01-01 NOTE — ED Notes (Signed)
Patient to be placed in area behind Triage, had waited prior to placement in room.

## 2018-01-19 ENCOUNTER — Ambulatory Visit: Payer: Medicare Other | Admitting: Podiatry

## 2018-02-12 ENCOUNTER — Ambulatory Visit: Payer: Medicare Other | Admitting: Podiatry

## 2018-04-30 ENCOUNTER — Ambulatory Visit: Payer: Medicare Other | Admitting: Podiatry

## 2018-12-02 ENCOUNTER — Emergency Department: Payer: Medicare Other

## 2018-12-02 ENCOUNTER — Other Ambulatory Visit: Payer: Self-pay

## 2018-12-02 ENCOUNTER — Observation Stay
Admission: EM | Admit: 2018-12-02 | Discharge: 2018-12-03 | Disposition: A | Discharge status: Home / Self Care | Payer: Medicare Other | Attending: Internal Medicine | Admitting: Internal Medicine

## 2018-12-02 DIAGNOSIS — Z79890 Hormone replacement therapy (postmenopausal): Secondary | ICD-10-CM | POA: Insufficient documentation

## 2018-12-02 DIAGNOSIS — Z1159 Encounter for screening for other viral diseases: Secondary | ICD-10-CM | POA: Diagnosis not present

## 2018-12-02 DIAGNOSIS — G9341 Metabolic encephalopathy: Secondary | ICD-10-CM | POA: Diagnosis not present

## 2018-12-02 DIAGNOSIS — Z8673 Personal history of transient ischemic attack (TIA), and cerebral infarction without residual deficits: Secondary | ICD-10-CM | POA: Insufficient documentation

## 2018-12-02 DIAGNOSIS — E039 Hypothyroidism, unspecified: Secondary | ICD-10-CM | POA: Diagnosis present

## 2018-12-02 DIAGNOSIS — F250 Schizoaffective disorder, bipolar type: Secondary | ICD-10-CM | POA: Diagnosis not present

## 2018-12-02 DIAGNOSIS — Z794 Long term (current) use of insulin: Secondary | ICD-10-CM | POA: Diagnosis not present

## 2018-12-02 DIAGNOSIS — E119 Type 2 diabetes mellitus without complications: Secondary | ICD-10-CM

## 2018-12-02 DIAGNOSIS — F17210 Nicotine dependence, cigarettes, uncomplicated: Secondary | ICD-10-CM | POA: Diagnosis not present

## 2018-12-02 DIAGNOSIS — E11649 Type 2 diabetes mellitus with hypoglycemia without coma: Secondary | ICD-10-CM | POA: Insufficient documentation

## 2018-12-02 DIAGNOSIS — J449 Chronic obstructive pulmonary disease, unspecified: Secondary | ICD-10-CM | POA: Diagnosis not present

## 2018-12-02 DIAGNOSIS — N390 Urinary tract infection, site not specified: Secondary | ICD-10-CM | POA: Diagnosis not present

## 2018-12-02 DIAGNOSIS — Z7951 Long term (current) use of inhaled steroids: Secondary | ICD-10-CM | POA: Diagnosis not present

## 2018-12-02 DIAGNOSIS — Z79899 Other long term (current) drug therapy: Secondary | ICD-10-CM | POA: Diagnosis not present

## 2018-12-02 DIAGNOSIS — I10 Essential (primary) hypertension: Secondary | ICD-10-CM | POA: Diagnosis not present

## 2018-12-02 DIAGNOSIS — R4182 Altered mental status, unspecified: Secondary | ICD-10-CM | POA: Diagnosis present

## 2018-12-02 LAB — COMPREHENSIVE METABOLIC PANEL
ALT: 18 U/L (ref 0–44)
AST: 25 U/L (ref 15–41)
Albumin: 3.3 g/dL — ABNORMAL LOW (ref 3.5–5.0)
Alkaline Phosphatase: 73 U/L (ref 38–126)
Anion gap: 12 (ref 5–15)
BUN: 25 mg/dL — ABNORMAL HIGH (ref 6–20)
CO2: 23 mmol/L (ref 22–32)
Calcium: 9.1 mg/dL (ref 8.9–10.3)
Chloride: 100 mmol/L (ref 98–111)
Creatinine, Ser: 0.79 mg/dL (ref 0.44–1.00)
GFR calc Af Amer: >60 mL/min (ref >60)
GFR calc non Af Amer: >60 mL/min (ref >60)
Glucose, Bld: 267 mg/dL — ABNORMAL HIGH (ref 70–99)
Potassium: 3.8 mmol/L (ref 3.5–5.1)
Sodium: 135 mmol/L (ref 135–145)
Total Bilirubin: 0.5 mg/dL (ref 0.3–1.2)
Total Protein: 5.9 g/dL — ABNORMAL LOW (ref 6.5–8.1)

## 2018-12-02 LAB — URINALYSIS, COMPLETE (UACMP) WITH MICROSCOPIC
Bilirubin Urine: NEGATIVE
Glucose, UA: NEGATIVE mg/dL
Hgb urine dipstick: NEGATIVE
Ketones, ur: NEGATIVE mg/dL
Nitrite: POSITIVE — AB
Protein, ur: NEGATIVE mg/dL
Specific Gravity, Urine: 1.013 (ref 1.005–1.030)
Squamous Epithelial / HPF: NONE SEEN (ref 0–5)
pH: 5 (ref 5.0–8.0)

## 2018-12-02 LAB — CBC WITH DIFFERENTIAL/PLATELET
Abs Immature Granulocytes: 0.06 10*3/uL (ref 0.00–0.07)
Basophils Absolute: 0.1 10*3/uL (ref 0.0–0.1)
Basophils Relative: 0 %
Eosinophils Absolute: 0.3 10*3/uL (ref 0.0–0.5)
Eosinophils Relative: 2 %
HCT: 32.2 % — ABNORMAL LOW (ref 36.0–46.0)
Hemoglobin: 10.5 g/dL — ABNORMAL LOW (ref 12.0–15.0)
Immature Granulocytes: 0 %
Lymphocytes Relative: 16 %
Lymphs Abs: 2.8 10*3/uL (ref 0.7–4.0)
MCH: 30.3 pg (ref 26.0–34.0)
MCHC: 32.6 g/dL (ref 30.0–36.0)
MCV: 93.1 fL (ref 80.0–100.0)
Monocytes Absolute: 1.1 10*3/uL — ABNORMAL HIGH (ref 0.1–1.0)
Monocytes Relative: 6 %
Neutro Abs: 13.5 10*3/uL — ABNORMAL HIGH (ref 1.7–7.7)
Neutrophils Relative %: 76 %
Platelets: 312 10*3/uL (ref 150–400)
RBC: 3.46 MIL/uL — ABNORMAL LOW (ref 3.87–5.11)
RDW: 13.4 % (ref 11.5–15.5)
WBC: 17.8 10*3/uL — ABNORMAL HIGH (ref 4.0–10.5)
nRBC: 0 % (ref 0.0–0.2)

## 2018-12-02 LAB — PREGNANCY, URINE: Preg Test, Ur: NEGATIVE

## 2018-12-02 LAB — GLUCOSE, CAPILLARY: Glucose-Capillary: 289 mg/dL — ABNORMAL HIGH (ref 70–99)

## 2018-12-02 MED ORDER — SODIUM CHLORIDE 0.9 % IV SOLN
1.0000 g | Freq: Once | INTRAVENOUS | Status: AC
  Administered 2018-12-03: 1 g via INTRAVENOUS
  Filled 2018-12-02: qty 10

## 2018-12-02 MED ORDER — HALOPERIDOL LACTATE 5 MG/ML IJ SOLN: 5.0000 mg | Freq: Once | INTRAMUSCULAR | Status: DC

## 2018-12-02 MED ORDER — CEPHALEXIN 500 MG PO CAPS
500.0000 mg | ORAL_CAPSULE | Freq: Three times a day (TID) | ORAL | Status: DC
  Administered 2018-12-02: 500 mg via ORAL
  Filled 2018-12-02: qty 1

## 2018-12-02 MED ORDER — HALOPERIDOL LACTATE 5 MG/ML IJ SOLN
5.0000 mg | Freq: Once | INTRAMUSCULAR | Status: AC
  Administered 2018-12-02: 5 mg via INTRAMUSCULAR
  Filled 2018-12-02: qty 1

## 2018-12-02 MED ORDER — LORAZEPAM 2 MG/ML IJ SOLN
2.0000 mg | Freq: Once | INTRAMUSCULAR | Status: AC
  Administered 2018-12-02: 2 mg via INTRAMUSCULAR
  Filled 2018-12-02: qty 1

## 2018-12-02 NOTE — ED Notes (Signed)
Pt states she feels weaker than normal

## 2018-12-02 NOTE — ED Notes (Signed)
Bed alarm placed in bed with pt

## 2018-12-02 NOTE — ED Notes (Signed)
Pt given water and diet meal tray at this time

## 2018-12-02 NOTE — ED Notes (Signed)
Pt refusing bloodwork and to give urine sample at this time

## 2018-12-02 NOTE — ED Notes (Signed)
Spoke with [facility] at this regarding pt

## 2018-12-02 NOTE — ED Triage Notes (Signed)
Pt to ED via EMS from [facility]. Per ems on arrival pt was combative on floor with cbg od 33. EMS gave 1 amp glucagon and pt ate sandwich and pepsi. EMS got cbg to 144, cbg 289 on arrival. Pt has extensive psych hx, ems states they do not believe pt has been taking meds regularly. Pt arrives yelling and being verbally abusive. Pt refusing bloodwork.

## 2018-12-02 NOTE — ED Provider Notes (Signed)
[facility] Emergency Department Provider Note   ____________________________________________   First MD Initiated Contact with Patient 12/02/18 2026     (approximate)  I have reviewed the triage vital signs and the nursing notes.   HISTORY  Chief Complaint Hypoglycemia  3 limited by altered mental status HPI Carrie Clark is a 58 y.o. female who reportedly was in her usual state of good health at the care home this morning and then sometime in the afternoon laid down on the floor and urinated on herself and then became confused and combative.  EMS arrived found her sugar was 33.  Apparently she ate something and now her sugar is 286.  She however is still confused although not combative.  Her speech is rambling and tangential.  She has a's history of schizophrenia and schizoaffective bipolar disorder.  Apparently according to her MAR she has not taken all of her medicines today.  We will see if we can get her mild agitation and refusal of blood work etc. under control and evaluate her further.  Currently she wants a pregnancy test and he wants me to check around for cramps but will not let her blood be drawn.  I am not sure if she hit her head.  She will not lay still enough to get a head CT.         Past Medical History:  Diagnosis Date  . COPD (chronic obstructive pulmonary disease) ([place])   . CVA (cerebral infarction)   . Depression   . Hypertension   . Hypothyroidism   . Schizophrenia ([place])   . Stroke ([place])   . Tardive dyskinesia     Patient Active Problem List   Diagnosis Date Noted  . Schizoaffective disorder, bipolar type ([place]) 09/11/2015  . Aggression   . Episode of behavior change   . Psychoses ([place])     History reviewed. No pertinent surgical history.  Prior to Admission medications   Medication Sig Start Date End Date Taking? Authorizing Provider  atorvastatin (LIPITOR) 40 MG tablet Take 40 mg by mouth daily.    Yes [provider]  benztropine (COGENTIN) 0.5 MG tablet Take 0.5 mg by mouth at bedtime.   Yes [provider]  cetirizine (ZYRTEC) 10 MG tablet Take 1 tablet (10 mg total) by mouth daily. 11/11/14  Yes Withrow, Elyse Jarvis, FNP  donepezil (ARICEPT) 5 MG tablet Take 5 mg by mouth at bedtime.   Yes [provider]  famotidine (PEPCID) 20 MG tablet Take 1 tablet (20 mg total) by mouth 2 (two) times daily. 11/11/14  Yes Withrow, Elyse Jarvis, FNP  fluPHENAZine (PROLIXIN) 10 MG tablet Take 1 tablet (10 mg total) by mouth at bedtime. Patient taking differently: Take 10 mg by mouth 3 (three) times daily.  11/11/14  Yes Withrow, Elyse Jarvis, FNP  fluticasone (FLONASE) 50 MCG/ACT nasal spray Place 50 sprays into both nostrils 2 (two) times daily as needed. Patient taking differently: Place 2 sprays into both nostrils every morning.  11/11/14  Yes Withrow, Elyse Jarvis, FNP  furosemide (LASIX) 20 MG tablet Take 1 tablet (20 mg total) by mouth daily. 11/11/14  Yes Withrow, John C, FNP  glucose blood test strip 1 each by Other route every morning. Use as instructed for blood sugar monitoring each morning before meals   Yes [provider]  Insulin Detemir (LEVEMIR) 100 UNIT/ML Pen Inject 25 Units into the skin 2 (two) times daily. Before breakfast and dinner    Yes  [provider]  levothyroxine (SYNTHROID) 100 MCG tablet Take 100 mcg by mouth daily before breakfast.    Yes [provider]  lisinopril (PRINIVIL,ZESTRIL) 5 MG tablet Take 5 mg by mouth daily.   Yes [provider]  lurasidone (LATUDA) 20 MG TABS tablet Take 20 mg by mouth daily. Take 20 mg PO at 1700 with a meal   Yes [provider]  metFORMIN (GLUCOPHAGE) 1000 MG tablet Take 1 tablet (1,000 mg total) by mouth 2 (two) times daily. 09/11/15  Yes Charm RingsLord, Jamison Y, NP  mirtazapine (REMERON) 15 MG tablet Take 15 mg by mouth at bedtime.   Yes [provider]  Multiple Vitamins-Minerals (MULTIVITAMIN WITH  MINERALS) tablet Take 1 tablet by mouth daily. 11/11/14  Yes Withrow, Everardo AllJohn C, FNP  Oxcarbazepine (TRILEPTAL) 300 MG tablet Take 1 tablet (300 mg total) by mouth 2 (two) times daily. 09/11/15  Yes Lord, Herminio HeadsJamison Y, NP  potassium chloride (K-DUR,KLOR-CON) 10 MEQ tablet Take 1 tablet (10 mEq total) by mouth daily. 11/11/14  Yes Withrow, Everardo AllJohn C, FNP  prazosin (MINIPRESS) 1 MG capsule Take 1 mg by mouth at bedtime.   Yes [provider]  tiotropium (SPIRIVA) 18 MCG inhalation capsule Place 18 mcg into inhaler and inhale daily.   Yes [provider]  zolpidem (AMBIEN) 5 MG tablet Take 5 mg by mouth at bedtime.   Yes [provider]  acetaminophen (TYLENOL) 325 MG tablet Take 325 mg by mouth every 6 (six) hours as needed for moderate pain.    [provider]  albuterol (PROVENTIL HFA;VENTOLIN HFA) 108 (90 Base) MCG/ACT inhaler Inhale 2 puffs into the lungs every 6 (six) hours as needed for wheezing or shortness of breath. 06/07/15   Mesner, Barbara CowerJason, MD  guaiFENesin (ROBITUSSIN) 100 MG/5ML SOLN Take 10 mLs by mouth every 4 (four) hours as needed for cough or to loosen phlegm.    [provider]  LORazepam (ATIVAN) 1 MG tablet Take 1 tablet (1 mg total) by mouth every 8 (eight) hours as needed for anxiety (agitation). Patient taking differently: Take 1 mg by mouth 2 (two) times daily as needed for anxiety (agitation).  09/11/15   Charm RingsLord, Jamison Y, NP  sodium chloride 1 g tablet Take 1 tablet (1 g total) by mouth 2 (two) times daily. Patient not taking: Reported on 12/02/2018 01/20/17   Kellie ShropshireShrosbree, Emily J, PA-C    Allergies Fish allergy  Family History  Family history unknown: Yes    Social History Social History   Tobacco Use  . Smoking status: Current Every Day Smoker    Packs/day: 1.00    Types: Cigarettes  . Smokeless tobacco: Never Used  Substance Use Topics  . Alcohol use: No  . Drug use: No    Review of Systems Unable to obtain   ____________________________________________   PHYSICAL EXAM:  VITAL SIGNS: ED Triage Vitals  Enc Vitals Group     BP 12/02/18 2026 116/70     Pulse Rate 12/02/18 2026 85     Resp 12/02/18 2026 20     Temp 12/02/18 2026 98.3 F (36.8 C)     Temp Source 12/02/18 2026 Oral     SpO2 12/02/18 2026 96 %     Weight 12/02/18 2027 145 lb (65.8 kg)     Height 12/02/18 2027 5\' 3"  (1.6 m)     Head Circumference --      Peak Flow --      Pain Score 12/02/18 2027 0  Pain Loc --      Pain Edu? --      Excl. in GC? --     Constitutional: Alert speech is nonsensical Eyes: Conjunctivae are normal.  Head: Atraumatic. Nose: No congestion/rhinnorhea. Mouth/Throat: Mucous membranes are moist.  Oropharynx non-erythematous. Neck: No stridor.  Cardiovascular: Normal rate, regular rhythm. Grossly normal heart sounds.  Good peripheral circulation. Respiratory: Normal respiratory effort.  No retractions. Lungs CTAB. Gastrointestinal: Soft and nontender. No distention. No abdominal bruits. No CVA tenderness. Musculoskeletal: No lower extremity tenderness nor edema.   Neurologic:  Normal speech and language. No gross focal neurologic deficits are appreciated. Skin:  Skin is warm, dry and intact. No rash noted.   ____________________________________________   LABS (all labs ordered are listed, but only abnormal results are displayed)  Labs Reviewed  GLUCOSE, CAPILLARY - Abnormal; Notable for the following components:      Result Value   Glucose-Capillary 289 (*)    All other components within normal limits  COMPREHENSIVE METABOLIC PANEL - Abnormal; Notable for the following components:   Glucose, Bld 267 (*)    BUN 25 (*)    Total Protein 5.9 (*)    Albumin 3.3 (*)    All other components within normal limits  CBC WITH DIFFERENTIAL/PLATELET - Abnormal; Notable for the following components:   WBC 17.8 (*)    RBC 3.46 (*)    Hemoglobin 10.5 (*)    HCT 32.2 (*)    Neutro Abs 13.5 (*)     Monocytes Absolute 1.1 (*)    All other components within normal limits  URINALYSIS, COMPLETE (UACMP) WITH MICROSCOPIC - Abnormal; Notable for the following components:   Color, Urine YELLOW (*)    APPearance CLEAR (*)    Nitrite POSITIVE (*)    Leukocytes,Ua SMALL (*)    Bacteria, UA RARE (*)    All other components within normal limits  SARS CORONAVIRUS 2 (HOSPITAL ORDER, PERFORMED IN [facility])  PREGNANCY, URINE   ____________________________________________  EKG   ____________________________________________  RADIOLOGY  ED MD interpretation:   Official radiology report(s): Ct Head Wo Contrast  Result Date: 12/02/2018 CLINICAL DATA:  Altered level of consciousness EXAM: CT HEAD WITHOUT CONTRAST TECHNIQUE: Contiguous axial images were obtained from the base of the skull through the vertex without intravenous contrast. COMPARISON:  02/04/2016 FINDINGS: Brain: No evidence of acute infarction, hemorrhage, hydrocephalus, extra-axial collection or mass lesion/mass effect. Vascular: No hyperdense vessel or unexpected calcification. Skull: Normal. Negative for fracture or focal lesion. Sinuses/Orbits: No acute finding. Other: None. IMPRESSION: Normal head CT for age Electronically Signed   By: Alcide CleverMark  Lukens M.D.   On: 12/02/2018 21:45   Dg Chest Portable 1 View  Result Date: 12/02/2018 CLINICAL DATA:  AMS EXAM: PORTABLE CHEST 1 VIEW COMPARISON:  07/03/2015 FINDINGS: The heart size and mediastinal contours are within normal limits. Both lungs are clear. The visualized skeletal structures are unremarkable. IMPRESSION: No acute abnormality of the lungs in AP portable projection. Electronically Signed   By: Lauralyn PrimesAlex  Bibbey M.D.   On: 12/02/2018 21:27    ____________________________________________   PROCEDURES  Procedure(s) performed (including Critical Care):  Procedures   ____________________________________________   INITIAL IMPRESSION / ASSESSMENT AND PLAN / ED  COURSE Carrie LovelessJoy Clark was evaluated in Emergency Department on 12/03/2018 for the symptoms described in the history of present illness. She was evaluated in the context of the global COVID-19 pandemic, which necessitated consideration that the patient might be at risk for infection with the SARS-CoV-2  virus that causes COVID-19. Institutional protocols and algorithms that pertain to the evaluation of patients at risk for COVID-19 are in a state of rapid change based on information released by regulatory bodies including the CDC and federal and state organizations. These policies and algorithms were followed during the patient's care in the ED.     Patient with history of recent hyperglycemia today also with altered mental status and UTI.  We will see if we can get her admitted and consult psychiatry.         ____________________________________________   FINAL CLINICAL IMPRESSION(S) / ED DIAGNOSES  Final diagnoses:  Urinary tract infection without hematuria, site unspecified  Altered mental status, unspecified altered mental status type     ED Discharge Orders    None       Note:  This document was prepared using Dragon voice recognition software and may include unintentional dictation errors.    Arnaldo NatalMalinda,  F, MD 12/03/18 [phone]

## 2018-12-03 ENCOUNTER — Other Ambulatory Visit: Payer: Self-pay

## 2018-12-03 ENCOUNTER — Encounter: Payer: Self-pay | Admitting: Internal Medicine

## 2018-12-03 DIAGNOSIS — E119 Type 2 diabetes mellitus without complications: Secondary | ICD-10-CM

## 2018-12-03 DIAGNOSIS — I10 Essential (primary) hypertension: Secondary | ICD-10-CM | POA: Diagnosis present

## 2018-12-03 DIAGNOSIS — E039 Hypothyroidism, unspecified: Secondary | ICD-10-CM | POA: Diagnosis present

## 2018-12-03 DIAGNOSIS — N390 Urinary tract infection, site not specified: Secondary | ICD-10-CM | POA: Diagnosis present

## 2018-12-03 DIAGNOSIS — R4182 Altered mental status, unspecified: Secondary | ICD-10-CM | POA: Diagnosis present

## 2018-12-03 DIAGNOSIS — J449 Chronic obstructive pulmonary disease, unspecified: Secondary | ICD-10-CM | POA: Diagnosis present

## 2018-12-03 LAB — BASIC METABOLIC PANEL
Anion gap: 8 (ref 5–15)
BUN: 21 mg/dL — ABNORMAL HIGH (ref 6–20)
CO2: 27 mmol/L (ref 22–32)
Calcium: 9 mg/dL (ref 8.9–10.3)
Chloride: 103 mmol/L (ref 98–111)
Creatinine, Ser: 0.73 mg/dL (ref 0.44–1.00)
GFR calc Af Amer: >60 mL/min (ref >60)
GFR calc non Af Amer: >60 mL/min (ref >60)
Glucose, Bld: 130 mg/dL — ABNORMAL HIGH (ref 70–99)
Potassium: 4.2 mmol/L (ref 3.5–5.1)
Sodium: 138 mmol/L (ref 135–145)

## 2018-12-03 LAB — GLUCOSE, CAPILLARY
Glucose-Capillary: 402 mg/dL — ABNORMAL HIGH (ref 70–99)
Glucose-Capillary: 120 mg/dL — ABNORMAL HIGH (ref 70–99)
Glucose-Capillary: 249 mg/dL — ABNORMAL HIGH (ref 70–99)

## 2018-12-03 LAB — CBC
HCT: 31.6 % — ABNORMAL LOW (ref 36.0–46.0)
Hemoglobin: 10.3 g/dL — ABNORMAL LOW (ref 12.0–15.0)
MCH: 30.4 pg (ref 26.0–34.0)
MCHC: 32.6 g/dL (ref 30.0–36.0)
MCV: 93.2 fL (ref 80.0–100.0)
Platelets: 317 10*3/uL (ref 150–400)
RBC: 3.39 MIL/uL — ABNORMAL LOW (ref 3.87–5.11)
RDW: 13.5 % (ref 11.5–15.5)
WBC: 12.1 10*3/uL — ABNORMAL HIGH (ref 4.0–10.5)
nRBC: 0 % (ref 0.0–0.2)

## 2018-12-03 LAB — MRSA PCR SCREENING: MRSA by PCR: POSITIVE — AB

## 2018-12-03 LAB — SARS CORONAVIRUS 2 BY RT PCR (HOSPITAL ORDER, PERFORMED IN ~~LOC~~ HOSPITAL LAB): SARS Coronavirus 2: NEGATIVE

## 2018-12-03 MED ORDER — CEPHALEXIN 500 MG PO CAPS: 500.0000 mg | ORAL_CAPSULE | Freq: Two times a day (BID) | ORAL | 0 refills | Status: AC

## 2018-12-03 MED ORDER — LURASIDONE HCL 40 MG PO TABS
20.0000 mg | ORAL_TABLET | Freq: Every day | ORAL | Status: DC
  Filled 2018-12-03: qty 1

## 2018-12-03 MED ORDER — LORAZEPAM 1 MG PO TABS
1.0000 mg | ORAL_TABLET | Freq: Two times a day (BID) | ORAL | Status: DC | PRN
  Administered 2018-12-03: 1 mg via ORAL
  Filled 2018-12-03: qty 1

## 2018-12-03 MED ORDER — FLUPHENAZINE HCL 5 MG PO TABS
10.0000 mg | ORAL_TABLET | Freq: Three times a day (TID) | ORAL | Status: DC
  Administered 2018-12-03: 10 mg via ORAL
  Filled 2018-12-03 (×3): qty 2

## 2018-12-03 MED ORDER — ACETAMINOPHEN 325 MG PO TABS: 650.0000 mg | ORAL_TABLET | Freq: Four times a day (QID) | ORAL | Status: DC | PRN

## 2018-12-03 MED ORDER — ONDANSETRON HCL 4 MG/2ML IJ SOLN: 4.0000 mg | Freq: Four times a day (QID) | INTRAMUSCULAR | Status: DC | PRN

## 2018-12-03 MED ORDER — FAMOTIDINE 20 MG PO TABS
20.0000 mg | ORAL_TABLET | Freq: Two times a day (BID) | ORAL | Status: DC
  Administered 2018-12-03: 09:00:00 20 mg via ORAL
  Filled 2018-12-03: qty 1

## 2018-12-03 MED ORDER — ONDANSETRON HCL 4 MG PO TABS: 4.0000 mg | ORAL_TABLET | Freq: Four times a day (QID) | ORAL | Status: DC | PRN

## 2018-12-03 MED ORDER — DONEPEZIL HCL 5 MG PO TABS: 5.0000 mg | ORAL_TABLET | Freq: Every day | ORAL | Status: DC

## 2018-12-03 MED ORDER — FLUPHENAZINE HCL 10 MG PO TABS: 10.0000 mg | ORAL_TABLET | Freq: Three times a day (TID) | ORAL | 0 refills | Status: DC

## 2018-12-03 MED ORDER — LORAZEPAM 1 MG PO TABS: 1.0000 mg | ORAL_TABLET | Freq: Two times a day (BID) | ORAL | Status: DC | PRN

## 2018-12-03 MED ORDER — ATORVASTATIN CALCIUM 20 MG PO TABS
40.0000 mg | ORAL_TABLET | Freq: Every day | ORAL | Status: DC
  Administered 2018-12-03: 09:00:00 40 mg via ORAL
  Filled 2018-12-03: qty 2

## 2018-12-03 MED ORDER — ENOXAPARIN SODIUM 40 MG/0.4ML ~~LOC~~ SOLN
40.0000 mg | SUBCUTANEOUS | Status: DC
  Administered 2018-12-03: 40 mg via SUBCUTANEOUS
  Filled 2018-12-03: qty 0.4

## 2018-12-03 MED ORDER — OXCARBAZEPINE 300 MG PO TABS: 300.0000 mg | ORAL_TABLET | Freq: Two times a day (BID) | ORAL | Status: DC

## 2018-12-03 MED ORDER — ACETAMINOPHEN 650 MG RE SUPP: 650.0000 mg | Freq: Four times a day (QID) | RECTAL | Status: DC | PRN

## 2018-12-03 MED ORDER — LEVOTHYROXINE SODIUM 100 MCG PO TABS
100.0000 ug | ORAL_TABLET | Freq: Every day | ORAL | Status: DC
  Filled 2018-12-03: qty 1

## 2018-12-03 MED ORDER — INSULIN ASPART 100 UNIT/ML ~~LOC~~ SOLN
0.0000 [IU] | Freq: Every day | SUBCUTANEOUS | Status: DC
  Administered 2018-12-03: 5 [IU] via SUBCUTANEOUS

## 2018-12-03 MED ORDER — BENZTROPINE MESYLATE 0.5 MG PO TABS
0.5000 mg | ORAL_TABLET | Freq: Every day | ORAL | Status: DC
  Filled 2018-12-03: qty 1

## 2018-12-03 MED ORDER — INSULIN ASPART 100 UNIT/ML ~~LOC~~ SOLN
0.0000 [IU] | Freq: Three times a day (TID) | SUBCUTANEOUS | Status: DC
  Administered 2018-12-03: 3 [IU] via SUBCUTANEOUS
  Filled 2018-12-03: qty 1

## 2018-12-03 MED ORDER — SODIUM CHLORIDE 0.9 % IV SOLN
1.0000 g | INTRAVENOUS | Status: DC
  Filled 2018-12-03: qty 10

## 2018-12-03 MED ORDER — INSULIN ASPART 100 UNIT/ML ~~LOC~~ SOLN
SUBCUTANEOUS | Status: AC
  Filled 2018-12-03: qty 1

## 2018-12-03 MED ORDER — TIOTROPIUM BROMIDE MONOHYDRATE 18 MCG IN CAPS
18.0000 ug | ORAL_CAPSULE | Freq: Every day | RESPIRATORY_TRACT | Status: DC
  Administered 2018-12-03: 09:00:00 18 ug via RESPIRATORY_TRACT
  Filled 2018-12-03: qty 5

## 2018-12-03 MED ORDER — MIRTAZAPINE 15 MG PO TABS: 15.0000 mg | ORAL_TABLET | Freq: Every day | ORAL | Status: DC

## 2018-12-03 NOTE — ED Notes (Signed)
Psychiatrist at bedside

## 2018-12-03 NOTE — Discharge Summary (Signed)
Carrie Clark at [place] NAME: Carrie Clark    MR#:  [id number]  DATE OF BIRTH:  1960-08-15  DATE OF ADMISSION:  12/02/2018 ADMITTING PHYSICIAN: Lance Coon, MD  DATE OF DISCHARGE: 12/03/2018  PRIMARY CARE PHYSICIAN: Carlos American, FNP    ADMISSION DIAGNOSIS:  Urinary tract infection without hematuria, site unspecified [N39.0] Altered mental status, unspecified altered mental status type [R41.82]  DISCHARGE DIAGNOSIS:  Principal Problem:   UTI (urinary tract infection) Active Problems:   Schizoaffective disorder, bipolar type (HCC)   Hypothyroidism   HTN (hypertension)   COPD (chronic obstructive pulmonary disease) (HCC)   Diabetes ([place])   Altered mental status   SECONDARY DIAGNOSIS:   Past Medical History:  Diagnosis Date  . COPD (chronic obstructive pulmonary disease) ([place])   . CVA (cerebral infarction)   . Depression   . Hypertension   . Hypothyroidism   . Schizophrenia ([place])   . Stroke ([place])   . Tardive dyskinesia     HOSPITAL COURSE:  58 y/o female with schizophrenia and COPD who presented to the emergency room due to altered mental status.  1.  Acute metabolic encephalopathy in the setting of hypoglycemia and urinary tract infection.  Mental status is at baseline.  2.  Urinary tract infection: Patient will continue on Keflex.  We asked that PCP please follow-up on final urine culture.  3.  Diabetes with hypoglycemia: This has resolved  4.  Schizophrenia: Patient was eval by psychiatry.  She will continue her outpatient psychiatric medications.  5.Tobacco dependence: Patient is encouraged to quit smoking and willing to attempt to quit was assessed. Patient highly motivated.Counseling was provided for 4 minutes.  6.  Hypothyroidism: Continue Synthroid  DISCHARGE CONDITIONS AND DIET:   Stable Diabetic diet  CONSULTS OBTAINED:    DRUG ALLERGIES:   Allergies  Allergen Reactions  . Fish Allergy Other  (See Comments)    Allergy reaction not listed- on MAR    DISCHARGE MEDICATIONS:   Allergies as of 12/03/2018      Reactions   Fish Allergy Other (See Comments)   Allergy reaction not listed- on MAR      Medication List    STOP taking these medications   sodium chloride 1 g tablet     TAKE these medications   acetaminophen 325 MG tablet Commonly known as: TYLENOL Take 325 mg by mouth every 6 (six) hours as needed for moderate pain.   albuterol 108 (90 Base) MCG/ACT inhaler Commonly known as: VENTOLIN HFA Inhale 2 puffs into the lungs every 6 (six) hours as needed for wheezing or shortness of breath.   atorvastatin 40 MG tablet Commonly known as: LIPITOR Take 40 mg by mouth daily.   benztropine 0.5 MG tablet Commonly known as: COGENTIN Take 0.5 mg by mouth at bedtime.   cetirizine 10 MG tablet Commonly known as: ZYRTEC Take 1 tablet (10 mg total) by mouth daily.   donepezil 5 MG tablet Commonly known as: ARICEPT Take 5 mg by mouth at bedtime.   famotidine 20 MG tablet Commonly known as: PEPCID Take 1 tablet (20 mg total) by mouth 2 (two) times daily.   fluPHENAZine 10 MG tablet Commonly known as: PROLIXIN Take 1 tablet (10 mg total) by mouth 3 (three) times daily.   fluticasone 50 MCG/ACT nasal spray Commonly known as: FLONASE Place 50 sprays into both nostrils 2 (two) times daily as needed. What changed:   how much to take  when to take this  furosemide 20 MG tablet Commonly known as: LASIX Take 1 tablet (20 mg total) by mouth daily.   glucose blood test strip 1 each by Other route every morning. Use as instructed for blood sugar monitoring each morning before meals   guaiFENesin 100 MG/5ML Soln Commonly known as: ROBITUSSIN Take 10 mLs by mouth every 4 (four) hours as needed for cough or to loosen phlegm.   Insulin Detemir 100 UNIT/ML Pen Commonly known as: LEVEMIR Inject 25 Units into the skin 2 (two) times daily. Before breakfast and dinner    levothyroxine 100 MCG tablet Commonly known as: SYNTHROID Take 100 mcg by mouth daily before breakfast.   lisinopril 5 MG tablet Commonly known as: ZESTRIL Take 5 mg by mouth daily.   LORazepam 1 MG tablet Commonly known as: ATIVAN Take 1 tablet (1 mg total) by mouth 2 (two) times daily as needed for anxiety (agitation).   lurasidone 20 MG Tabs tablet Commonly known as: LATUDA Take 20 mg by mouth daily. Take 20 mg PO at 1700 with a meal   metFORMIN 1000 MG tablet Commonly known as: GLUCOPHAGE Take 1 tablet (1,000 mg total) by mouth 2 (two) times daily.   mirtazapine 15 MG tablet Commonly known as: REMERON Take 15 mg by mouth at bedtime.   multivitamin with minerals tablet Take 1 tablet by mouth daily.   Oxcarbazepine 300 MG tablet Commonly known as: TRILEPTAL Take 1 tablet (300 mg total) by mouth 2 (two) times daily.   potassium chloride 10 MEQ tablet Commonly known as: K-DUR Take 1 tablet (10 mEq total) by mouth daily.   prazosin 1 MG capsule Commonly known as: MINIPRESS Take 1 mg by mouth at bedtime.   tiotropium 18 MCG inhalation capsule Commonly known as: SPIRIVA Place 18 mcg into inhaler and inhale daily.   zolpidem 5 MG tablet Commonly known as: AMBIEN Take 5 mg by mouth at bedtime.         Today   CHIEF COMPLAINT:  Patient doing well this am MS back to baseline   VITAL SIGNS:  Blood pressure (!) 123/59, pulse 62, temperature 98.8 F (37.1 C), temperature source Oral, resp. rate 20, height 5\' 3"  (1.6 m), weight 65.8 kg, SpO2 100 %.   REVIEW OF SYSTEMS:  Review of Systems  Constitutional: Negative.  Negative for chills, fever and malaise/fatigue.  HENT: Negative.  Negative for ear discharge, ear pain, hearing loss, nosebleeds and sore throat.   Eyes: Negative.  Negative for blurred vision and pain.  Respiratory: Negative.  Negative for cough, hemoptysis, shortness of breath and wheezing.   Cardiovascular: Negative.  Negative for chest  pain, palpitations and leg swelling.  Gastrointestinal: Negative.  Negative for abdominal pain, blood in stool, diarrhea, nausea and vomiting.  Genitourinary: Negative.  Negative for dysuria.  Musculoskeletal: Negative.  Negative for back pain.  Skin: Negative.   Neurological: Negative for dizziness, tremors, speech change, focal weakness, seizures and headaches.  Endo/Heme/Allergies: Negative.  Does not bruise/bleed easily.  Psychiatric/Behavioral: Negative.  Negative for depression, hallucinations and suicidal ideas.     PHYSICAL EXAMINATION:  GENERAL:  58 y.o.-year-old patient lying in the bed with no acute distress.  NECK:  Supple, no jugular venous distention. No thyroid enlargement, no tenderness.  LUNGS: Normal breath sounds bilaterally, no wheezing, rales,rhonchi  No use of accessory muscles of respiration.  CARDIOVASCULAR: S1, S2 normal. No murmurs, rubs, or gallops.  ABDOMEN: Soft, non-tender, non-distended. Bowel sounds present. No organomegaly or mass.  EXTREMITIES: No pedal edema, cyanosis,  or clubbing.  PSYCHIATRIC: The patient is alert and oriented x 3.  SKIN: No obvious rash, lesion, or ulcer.   DATA REVIEW:   CBC Recent Labs  Lab 12/03/18 0748  WBC 12.1*  HGB 10.3*  HCT 31.6*  PLT 317    Chemistries  Recent Labs  Lab 12/02/18 2210 12/03/18 0748  NA 135 138  K 3.8 4.2  CL 100 103  CO2 23 27  GLUCOSE 267* 130*  BUN 25* 21*  CREATININE 0.79 0.73  CALCIUM 9.1 9.0  AST 25  --   ALT 18  --   ALKPHOS 73  --   BILITOT 0.5  --     Cardiac Enzymes No results for input(s): TROPONINI in the last 168 hours.  Microbiology Results  @MICRORSLT48 @  RADIOLOGY:  Ct Head Wo Contrast  Result Date: 12/02/2018 CLINICAL DATA:  Altered level of consciousness EXAM: CT HEAD WITHOUT CONTRAST TECHNIQUE: Contiguous axial images were obtained from the base of the skull through the vertex without intravenous contrast. COMPARISON:  02/04/2016 FINDINGS: Brain: No evidence  of acute infarction, hemorrhage, hydrocephalus, extra-axial collection or mass lesion/mass effect. Vascular: No hyperdense vessel or unexpected calcification. Skull: Normal. Negative for fracture or focal lesion. Sinuses/Orbits: No acute finding. Other: None. IMPRESSION: Normal head CT for age Electronically Signed   By: Alcide CleverMark  Lukens M.D.   On: 12/02/2018 21:45   Dg Chest Portable 1 View  Result Date: 12/02/2018 CLINICAL DATA:  AMS EXAM: PORTABLE CHEST 1 VIEW COMPARISON:  07/03/2015 FINDINGS: The heart size and mediastinal contours are within normal limits. Both lungs are clear. The visualized skeletal structures are unremarkable. IMPRESSION: No acute abnormality of the lungs in AP portable projection. Electronically Signed   By: Lauralyn PrimesAlex  Bibbey M.D.   On: 12/02/2018 21:27      Allergies as of 12/03/2018      Reactions   Fish Allergy Other (See Comments)   Allergy reaction not listed- on MAR      Medication List    STOP taking these medications   sodium chloride 1 g tablet     TAKE these medications   acetaminophen 325 MG tablet Commonly known as: TYLENOL Take 325 mg by mouth every 6 (six) hours as needed for moderate pain.   albuterol 108 (90 Base) MCG/ACT inhaler Commonly known as: VENTOLIN HFA Inhale 2 puffs into the lungs every 6 (six) hours as needed for wheezing or shortness of breath.   atorvastatin 40 MG tablet Commonly known as: LIPITOR Take 40 mg by mouth daily.   benztropine 0.5 MG tablet Commonly known as: COGENTIN Take 0.5 mg by mouth at bedtime.   cetirizine 10 MG tablet Commonly known as: ZYRTEC Take 1 tablet (10 mg total) by mouth daily.   donepezil 5 MG tablet Commonly known as: ARICEPT Take 5 mg by mouth at bedtime.   famotidine 20 MG tablet Commonly known as: PEPCID Take 1 tablet (20 mg total) by mouth 2 (two) times daily.   fluPHENAZine 10 MG tablet Commonly known as: PROLIXIN Take 1 tablet (10 mg total) by mouth 3 (three) times daily.   fluticasone  50 MCG/ACT nasal spray Commonly known as: FLONASE Place 50 sprays into both nostrils 2 (two) times daily as needed. What changed:   how much to take  when to take this   furosemide 20 MG tablet Commonly known as: LASIX Take 1 tablet (20 mg total) by mouth daily.   glucose blood test strip 1 each by Other route every morning. Use  as instructed for blood sugar monitoring each morning before meals   guaiFENesin 100 MG/5ML Soln Commonly known as: ROBITUSSIN Take 10 mLs by mouth every 4 (four) hours as needed for cough or to loosen phlegm.   Insulin Detemir 100 UNIT/ML Pen Commonly known as: LEVEMIR Inject 25 Units into the skin 2 (two) times daily. Before breakfast and dinner   levothyroxine 100 MCG tablet Commonly known as: SYNTHROID Take 100 mcg by mouth daily before breakfast.   lisinopril 5 MG tablet Commonly known as: ZESTRIL Take 5 mg by mouth daily.   LORazepam 1 MG tablet Commonly known as: ATIVAN Take 1 tablet (1 mg total) by mouth 2 (two) times daily as needed for anxiety (agitation).   lurasidone 20 MG Tabs tablet Commonly known as: LATUDA Take 20 mg by mouth daily. Take 20 mg PO at 1700 with a meal   metFORMIN 1000 MG tablet Commonly known as: GLUCOPHAGE Take 1 tablet (1,000 mg total) by mouth 2 (two) times daily.   mirtazapine 15 MG tablet Commonly known as: REMERON Take 15 mg by mouth at bedtime.   multivitamin with minerals tablet Take 1 tablet by mouth daily.   Oxcarbazepine 300 MG tablet Commonly known as: TRILEPTAL Take 1 tablet (300 mg total) by mouth 2 (two) times daily.   potassium chloride 10 MEQ tablet Commonly known as: K-DUR Take 1 tablet (10 mEq total) by mouth daily.   prazosin 1 MG capsule Commonly known as: MINIPRESS Take 1 mg by mouth at bedtime.   tiotropium 18 MCG inhalation capsule Commonly known as: SPIRIVA Place 18 mcg into inhaler and inhale daily.   zolpidem 5 MG tablet Commonly known as: AMBIEN Take 5 mg by mouth  at bedtime.         Management plans discussed with the patient and she is in agreement. Stable for discharge   Patient should follow up with pcp  CODE STATUS:     Code Status Orders  (From admission, onward)         Start     Ordered   12/03/18 0247  Full code  Continuous     12/03/18 0246        Code Status History    Date Active Date Inactive Code Status Order ID Comments User Context   09/07/2015 1614 09/11/2015 2108 Full Code [id number]  Marily MemosMesner, Jason, MD ED   11/11/2014 0319 11/11/2014 2030 Full Code [id number]  Antony MaduraHumes, Kelly, PA-C ED   10/07/2014 1206 10/10/2014 2041 Full Code [id number]  Benjiman CorePickering, Nathan, MD ED   Advance Care Planning Activity      TOTAL TIME TAKING CARE OF THIS PATIENT: 38 minutes.    Note: This dictation was prepared with Dragon dictation along with smaller phrase technology. Any transcriptional errors that result from this process are unintentional.  Adrian SaranSital Giavanna Kang M.D on 12/03/2018 at 10:40 AM  Between 7am to 6pm - Pager - [phone] After 6pm go to [email or web address] - Social research officer, [email or web address] [email or web address]  [facility]  Office  [phone]  CC: Primary care physician; Duffy RhodyPartridge, [place], FNP

## 2018-12-03 NOTE — Consult Note (Signed)
[facility] Face-to-Face Psychiatry Consult   Reason for Consult: Altered mental status Referring Physician: Dr. Juliette Alcide Patient Identification: Carrie Clark MRN:  [id number] Principal Diagnosis: Hypothyroidism Diagnosis:  Principal Problem:   Hypothyroidism Active Problems:   Schizoaffective disorder, bipolar type (HCC)   UTI (urinary tract infection)   HTN (hypertension)   COPD (chronic obstructive pulmonary disease) (HCC)   Diabetes (HCC)   Altered mental status   Total Time spent with patient: 1 hour  Subjective: " I know where I am. In the hospital." Carrie Clark is a 58 y.o. female patient presented to [facility] ED via EMS from [facility] assisted living facility.  Per ED triage notes; the patient was combative upon arrival to [facility] facility.  She was found on the floor with CBG OD 33.  Per patient urinalysis she is positive for bacteria in her urine (UTI) and is currently being treated in the ED with antibiotics. The patient was seen face-to-face by this provider; chart reviewed and consulted with Dr. Juliette Alcide on 12/03/2018 due to the care of the patient. It was discussed with the provider that the patient does meet criteria to be admitted to the psychiatric inpatient unit.   It was discussed that once the patient is medically clear and still presenting with altered mental status, she could be reassessed. Currently, the patient behavior does not seem to be a psychiatric issue, but more medically induced.  Per past notes the patient was doing fine earlier in the day and acutely became ill.  On evaluation the patient is alert and oriented x2, calm and cooperative, and mood-congruent with affect. The patient does not appear to be responding to internal or external stimuli. She is presenting with delusional thinking at times. The patient denies auditory or visual hallucinations.  But stated "there was a time I used to hear the voices."  The patient denies any suicidal, homicidal, or self-harm  ideations. The patient is presenting with some psychotic behaviors. During an encounter with the patient, she was able to answer few questions appropriately. Collateral was attempted Ms. Purvis Sheffield [phone]) with no voicemail set up therefore this provider was unable to leave a HIPAA approved message.  Plan: The patient is going to be transfer to medical services. Once the patient is clear from medicine and continues to require psychiatric care she could be reassessed by psychiatry  HPI: Per Dr. Darnelle Catalan; Carrie Clark is a 58 y.o. female who reportedly was in her usual state of good health at the care home this morning and then sometime in the afternoon laid down on the floor and urinated on herself and then became confused and combative.  EMS arrived found her sugar was 33.  Apparently she ate something and now her sugar is 286.  She however is still confused although not combative.  Her speech is rambling and tangential.  She has a's history of schizophrenia and schizoaffective bipolar disorder.  Apparently according to her MAR she has not taken all of her medicines today.  We will see if we can get her mild agitation and refusal of blood work etc. under control and evaluate her further.  Currently she wants a pregnancy test and he wants me to check around for cramps but will not let her blood be drawn.  I am not sure if she hit her head.  She will not lay still enough to get a head CT.  Past Psychiatric History:  Depression Schizophrenia (HCC) Stroke (HCC) Tardive dyskinesia Schizoaffective disorder, bipolar  type [facility](HCC) Aggression Episode of behavioral change Psychosis (HCC)  Risk to Self:  No Risk to Others:  No Prior Inpatient Therapy:  Yes Prior Outpatient Therapy:  Yes  Past Medical History:  Past Medical History:  Diagnosis Date  . COPD (chronic obstructive pulmonary disease) (HCC)   . CVA (cerebral infarction)   . Depression   . Hypertension   .  Hypothyroidism   . Schizophrenia (HCC)   . Stroke (HCC)   . Tardive dyskinesia     Past Surgical History:  Procedure Laterality Date  . NO PAST SURGERIES     Family History:  Family History  Family history unknown: Yes   Family Psychiatric  History: Unknown Social History:  Social History   Substance and Sexual Activity  Alcohol Use No     Social History   Substance and Sexual Activity  Drug Use No    Social History   Socioeconomic History  . Marital status: Unknown    Spouse name: Not on file  . Number of children: Not on file  . Years of education: Not on file  . Highest education level: Not on file  Occupational History  . Not on file  Social Needs  . Financial resource strain: Not on file  . Food insecurity    Worry: Not on file    Inability: Not on file  . Transportation needs    Medical: Not on file    Non-medical: Not on file  Tobacco Use  . Smoking status: Current Every Day Smoker    Packs/day: 1.00    Types: Cigarettes  . Smokeless tobacco: Never Used  Substance and Sexual Activity  . Alcohol use: No  . Drug use: No  . Sexual activity: Not on file  Lifestyle  . Physical activity    Days per week: Not on file    Minutes per session: Not on file  . Stress: Not on file  Relationships  . Social Musicianconnections    Talks on phone: Not on file    Gets together: Not on file    Attends religious service: Not on file    Active member of club or organization: Not on file    Attends meetings of clubs or organizations: Not on file    Relationship status: Not on file  Other Topics Concern  . Not on file  Social History Narrative  . Not on file   Additional Social History:    Allergies:   Allergies  Allergen Reactions  . Fish Allergy Other (See Comments)    Allergy reaction not listed- on MAR    Labs:  Results for orders placed or performed during the hospital encounter of 12/02/18 (from the past 48 hour(s))  Glucose, capillary     Status:  Abnormal   Collection Time: 12/02/18  8:26 PM  Result Value Ref Range   Glucose-Capillary 289 (H) 70 - 99 mg/dL  Comprehensive metabolic panel     Status: Abnormal   Collection Time: 12/02/18 10:10 PM  Result Value Ref Range   Sodium 135 135 - 145 mmol/L   Potassium 3.8 3.5 - 5.1 mmol/L   Chloride 100 98 - 111 mmol/L   CO2 23 22 - 32 mmol/L   Glucose, Bld 267 (H) 70 - 99 mg/dL   BUN 25 (H) 6 - 20 mg/dL   Creatinine, Ser 4.090.79 0.44 - 1.00 mg/dL   Calcium 9.1 8.9 - 81.110.3 mg/dL   Total Protein 5.9 (L) 6.5 - 8.1 g/dL  Albumin 3.3 (L) 3.5 - 5.0 g/dL   AST 25 15 - 41 U/L   ALT 18 0 - 44 U/L   Alkaline Phosphatase 73 38 - 126 U/L   Total Bilirubin 0.5 0.3 - 1.2 mg/dL   GFR calc non Af Amer >60 >60 mL/min   GFR calc Af Amer >60 >60 mL/min   Anion gap 12 5 - 15    Comment: Performed at [facility], [address] [place], [place] [address]  CBC with Differential     Status: Abnormal   Collection Time: 12/02/18 10:10 PM  Result Value Ref Range   WBC 17.8 (H) 4.0 - 10.5 K/uL   RBC 3.46 (L) 3.87 - 5.11 MIL/uL   Hemoglobin 10.5 (L) 12.0 - 15.0 g/dL   HCT 60.4 (L) 54.0 - 98.1 %   MCV 93.1 80.0 - 100.0 fL   MCH 30.3 26.0 - 34.0 pg   MCHC 32.6 30.0 - 36.0 g/dL   RDW 19.1 47.8 - 29.5 %   Platelets 312 150 - 400 K/uL   nRBC 0.0 0.0 - 0.2 %   Neutrophils Relative % 76 %   Neutro Abs 13.5 (H) 1.7 - 7.7 K/uL   Lymphocytes Relative 16 %   Lymphs Abs 2.8 0.7 - 4.0 K/uL   Monocytes Relative 6 %   Monocytes Absolute 1.1 (H) 0.1 - 1.0 K/uL   Eosinophils Relative 2 %   Eosinophils Absolute 0.3 0.0 - 0.5 K/uL   Basophils Relative 0 %   Basophils Absolute 0.1 0.0 - 0.1 K/uL   Immature Granulocytes 0 %   Abs Immature Granulocytes 0.06 0.00 - 0.07 K/uL    Comment: Performed at [facility], [address] [place], [place] [address]  Urinalysis, Complete w Microscopic     Status: Abnormal   Collection Time: 12/02/18 11:10 PM  Result Value Ref Range   Color, Urine YELLOW  (A) YELLOW   APPearance CLEAR (A) CLEAR   Specific Gravity, Urine 1.013 1.005 - 1.030   pH 5.0 5.0 - 8.0   Glucose, UA NEGATIVE NEGATIVE mg/dL   Hgb urine dipstick NEGATIVE NEGATIVE   Bilirubin Urine NEGATIVE NEGATIVE   Ketones, ur NEGATIVE NEGATIVE mg/dL   Protein, ur NEGATIVE NEGATIVE mg/dL   Nitrite POSITIVE (A) NEGATIVE   Leukocytes,Ua SMALL (A) NEGATIVE   RBC / HPF 0-5 0 - 5 RBC/hpf   WBC, UA 6-10 0 - 5 WBC/hpf   Bacteria, UA RARE (A) NONE SEEN   Squamous Epithelial / LPF NONE SEEN 0 - 5   WBC Clumps PRESENT    Hyaline Casts, UA PRESENT     Comment: Performed at [facility], [address] [place], [place] [address]  Pregnancy, urine     Status: None   Collection Time: 12/02/18 11:10 PM  Result Value Ref Range   Preg Test, Ur NEGATIVE NEGATIVE    Comment: Performed at [facility], [address], [place], [place] [address]  SARS Coronavirus 2 (CEPHEID - Performed in [facility] hospital lab), Hosp Order     Status: None   Collection Time: 12/03/18  1:26 AM   Specimen: Nasopharyngeal Swab  Result Value Ref Range   SARS Coronavirus 2 NEGATIVE NEGATIVE    Comment: (NOTE) If result is NEGATIVE SARS-CoV-2 target nucleic acids are NOT DETECTED. The SARS-CoV-2 RNA is generally detectable in upper and lower  respiratory specimens during the acute phase of infection. The lowest  concentration of SARS-CoV-2 viral copies this assay  can detect is 250  copies / mL. A negative result does not preclude SARS-CoV-2 infection  and should not be used as the sole basis for treatment or other  patient management decisions.  A negative result may occur with  improper specimen collection / handling, submission of specimen other  than nasopharyngeal swab, presence of viral mutation(s) within the  areas targeted by this assay, and inadequate number of viral copies  (<250 copies / mL). A negative result must be combined with clinical  observations, patient history, and  epidemiological information. If result is POSITIVE SARS-CoV-2 target nucleic acids are DETECTED. The SARS-CoV-2 RNA is generally detectable in upper and lower  respiratory specimens dur ing the acute phase of infection.  Positive  results are indicative of active infection with SARS-CoV-2.  Clinical  correlation with patient history and other diagnostic information is  necessary to determine patient infection status.  Positive results do  not rule out bacterial infection or co-infection with other viruses. If result is PRESUMPTIVE POSTIVE SARS-CoV-2 nucleic acids MAY BE PRESENT.   A presumptive positive result was obtained on the submitted specimen  and confirmed on repeat testing.  While 2019 novel coronavirus  (SARS-CoV-2) nucleic acids may be present in the submitted sample  additional confirmatory testing may be necessary for epidemiological  and / or clinical management purposes  to differentiate between  SARS-CoV-2 and other Sarbecovirus currently known to infect humans.  If clinically indicated additional testing with an alternate test  methodology [id number]) is advised. The SARS-CoV-2 RNA is generally  detectable in upper and lower respiratory sp ecimens during the acute  phase of infection. The expected result is Negative. Fact Sheet for Patients:  BoilerBrush.com.cyhttps://www.fda.gov/media/136312/download Fact Sheet for Healthcare Providers: https://pope.com/https://www.fda.gov/media/136313/download This test is not yet approved or cleared by the [place] FDA and has been authorized for detection and/or diagnosis of SARS-CoV-2 by FDA under an Emergency Use Authorization (EUA).  This EUA will remain in effect (meaning this test can be used) for the duration of the COVID-19 declaration under Section 564(b)(1) of the Act, 21 U.S.C. section 360bbb-3(b)(1), unless the authorization is terminated or revoked sooner. Performed at [facility], [address] [place], [place] [address]   Glucose,  capillary     Status: Abnormal   Collection Time: 12/03/18  2:34 AM  Result Value Ref Range   Glucose-Capillary 402 (H) 70 - 99 mg/dL    Current Facility-Administered Medications  Medication Dose Route Frequency Provider Last Rate Last Dose  . cephALEXin (KEFLEX) capsule 500 mg  500 mg Oral Q8H Arnaldo NatalMalinda, Paul F, MD   500 mg at 12/02/18 2353  . haloperidol lactate (HALDOL) injection 5 mg  5 mg Intramuscular Once Arnaldo NatalMalinda, Paul F, MD   Stopped at 12/02/18 2316  . insulin aspart (novoLOG) 100 UNIT/ML injection           . insulin aspart (novoLOG) injection 0-5 Units  0-5 Units Subcutaneous QHS Oralia ManisWillis, David, MD   5 Units at 12/03/18 [id number]   Current Outpatient Medications  Medication Sig Dispense Refill  . atorvastatin (LIPITOR) 40 MG tablet Take 40 mg by mouth daily.     . benztropine (COGENTIN) 0.5 MG tablet Take 0.5 mg by mouth at bedtime.    . cetirizine (ZYRTEC) 10 MG tablet Take 1 tablet (10 mg total) by mouth daily.    Marland Kitchen. donepezil (ARICEPT) 5 MG tablet Take 5 mg by mouth at bedtime.    . famotidine (PEPCID) 20 MG tablet Take 1 tablet (20  mg total) by mouth 2 (two) times daily.    . fluPHENAZine (PROLIXIN) 10 MG tablet Take 1 tablet (10 mg total) by mouth at bedtime. (Patient taking differently: Take 10 mg by mouth 3 (three) times daily. )    . fluticasone (FLONASE) 50 MCG/ACT nasal spray Place 50 sprays into both nostrils 2 (two) times daily as needed. (Patient taking differently: Place 2 sprays into both nostrils every morning. )    . furosemide (LASIX) 20 MG tablet Take 1 tablet (20 mg total) by mouth daily. 30 tablet   . glucose blood test strip 1 each by Other route every morning. Use as instructed for blood sugar monitoring each morning before meals    . Insulin Detemir (LEVEMIR) 100 UNIT/ML Pen Inject 25 Units into the skin 2 (two) times daily. Before breakfast and dinner     . levothyroxine (SYNTHROID) 100 MCG tablet Take 100 mcg by mouth daily before breakfast.     . lisinopril  (PRINIVIL,ZESTRIL) 5 MG tablet Take 5 mg by mouth daily.    Marland Kitchen lurasidone (LATUDA) 20 MG TABS tablet Take 20 mg by mouth daily. Take 20 mg PO at 1700 with a meal    . metFORMIN (GLUCOPHAGE) 1000 MG tablet Take 1 tablet (1,000 mg total) by mouth 2 (two) times daily.    . mirtazapine (REMERON) 15 MG tablet Take 15 mg by mouth at bedtime.    . Multiple Vitamins-Minerals (MULTIVITAMIN WITH MINERALS) tablet Take 1 tablet by mouth daily.    . Oxcarbazepine (TRILEPTAL) 300 MG tablet Take 1 tablet (300 mg total) by mouth 2 (two) times daily. 60 tablet 0  . potassium chloride (K-DUR,KLOR-CON) 10 MEQ tablet Take 1 tablet (10 mEq total) by mouth daily.    . prazosin (MINIPRESS) 1 MG capsule Take 1 mg by mouth at bedtime.    Marland Kitchen tiotropium (SPIRIVA) 18 MCG inhalation capsule Place 18 mcg into inhaler and inhale daily.    Marland Kitchen zolpidem (AMBIEN) 5 MG tablet Take 5 mg by mouth at bedtime.    Marland Kitchen acetaminophen (TYLENOL) 325 MG tablet Take 325 mg by mouth every 6 (six) hours as needed for moderate pain.    Marland Kitchen albuterol (PROVENTIL HFA;VENTOLIN HFA) 108 (90 Base) MCG/ACT inhaler Inhale 2 puffs into the lungs every 6 (six) hours as needed for wheezing or shortness of breath. 1 Inhaler 0  . guaiFENesin (ROBITUSSIN) 100 MG/5ML SOLN Take 10 mLs by mouth every 4 (four) hours as needed for cough or to loosen phlegm.    Marland Kitchen LORazepam (ATIVAN) 1 MG tablet Take 1 tablet (1 mg total) by mouth every 8 (eight) hours as needed for anxiety (agitation). (Patient taking differently: Take 1 mg by mouth 2 (two) times daily as needed for anxiety (agitation). ) 30 tablet 0  . sodium chloride 1 g tablet Take 1 tablet (1 g total) by mouth 2 (two) times daily. (Patient not taking: Reported on 12/02/2018) 14 tablet 0    Musculoskeletal: Strength & Muscle Tone: within normal limits Gait & Station: normal Patient leans: N/A  Psychiatric Specialty Exam: Physical Exam  Nursing note and vitals reviewed. Constitutional: She appears well-developed and  well-nourished.  HENT:  Head: Normocephalic and atraumatic.  Eyes: Pupils are equal, round, and reactive to light. Conjunctivae are normal.  Neck: Normal range of motion. Neck supple.  Cardiovascular: Normal rate and regular rhythm.  Respiratory: Effort normal and breath sounds normal.  Musculoskeletal: Normal range of motion.  Neurological: She is alert.  Skin: Skin is warm and  dry.    Review of Systems  Psychiatric/Behavioral: Positive for hallucinations. The patient is nervous/anxious.   All other systems reviewed and are negative.   Blood pressure 116/70, pulse 85, temperature 98.3 F (36.8 C), temperature source Oral, resp. rate 20, height 5\' 3"  (1.6 m), weight 65.8 kg, SpO2 96 %.Body mass index is 25.69 kg/m.  General Appearance: Bizarre  Eye Contact:  Good  Speech:  Pressured  Volume:  Increased  Mood:  Anxious and Euphoric  Affect:  Inappropriate  Thought Process:  Disorganized and Irrelevant  Orientation:  Full (Time, Place, and Person)  Thought Content:  Illogical and Delusions  Suicidal Thoughts:  No  Homicidal Thoughts:  No  Memory:  Immediate;   Poor Recent;   Poor  Judgement:  Impaired  Insight:  Lacking  Psychomotor Activity:  Normal  Concentration:  Concentration: Poor and Attention Span: Poor  Recall:  Poor  Fund of Knowledge:  Poor  Language:  Poor  Akathisia:  Negative  Handed:  Right  AIMS (if indicated):     Assets:  Desire for Improvement Physical Health Social Support  ADL's:  Intact  Cognition:  WNL  Sleep:        Treatment Plan Summary: Daily contact with patient to assess and evaluate symptoms and progress in treatment and Medication management  Disposition: No evidence of imminent risk to self or others at present.   Patient does not meet criteria for psychiatric inpatient admission. Supportive therapy provided about ongoing stressors.  Catalina GravelJacqueline Thomspon, NP 12/03/2018 2:44 AM

## 2018-12-03 NOTE — TOC Initial Note (Signed)
Transition of Care [facility](TOC) - Initial/Assessment Note    Patient Details  Name: Carrie Clark MRN: [id number] Date of Birth: 08-05-1960  Transition of Care [facility](TOC) CM/SW Contact:    Carrie ButcherJeanna M Xavien Dauphinais, RN Phone Number: 12/03/2018, 9:28 AM  Clinical Narrative:                 Patient admitted with hypoglycemia and UTI.  Patient is from [facility] in [place], it is a group home.  Patient has lived at this group home for about a year.  Per group home patient's legal guardian is her sister Carrie Clark.  RNCM attempted to reach Carrie Clark but she was not at home- left message for her to return RNCM call when she can, but it is not emergent.  [facility] number is [phone]- Main office is [phone].  When patient is ready for discharge main office should be contacted and they will come and pick patient up.  Patient has an extensive psych history, psych has been consulted.    Expected Discharge Plan: Home/Self Care(Group home) Barriers to Discharge: Continued Medical Work up   Patient Goals and CMS Choice Patient states their goals for this hospitalization and ongoing recovery are:: Pt is unable to state goals      Expected Discharge Plan and Services Expected Discharge Plan: Home/Self Care(Group home)   Discharge Planning Services: CM Consult   Living arrangements for the past 2 months: Group Home                                      Prior Living Arrangements/Services Living arrangements for the past 2 months: Group Home Lives with:: Facility Resident Patient language and need for interpreter reviewed:: No        Need for Family Participation in Patient Care: Yes (Comment) Care giver support system in place?: Yes (comment)(sister is legal guardian)   Criminal Activity/Legal Involvement Pertinent to Current Situation/Hospitalization: No - Comment as needed  Activities of Daily Living Home Assistive Devices/Equipment: CBG Meter ADL Screening (condition at  time of admission) Patient's cognitive ability adequate to safely complete daily activities?: Yes Is the patient deaf or have difficulty hearing?: No Does the patient have difficulty seeing, even when wearing glasses/contacts?: No Does the patient have difficulty concentrating, remembering, or making decisions?: Yes Patient able to express need for assistance with ADLs?: Yes Does the patient have difficulty dressing or bathing?: No Independently performs ADLs?: Yes (appropriate for developmental age) Does the patient have difficulty walking or climbing stairs?: No Weakness of Legs: None Weakness of Arms/Hands: None  Permission Sought/Granted Permission sought to share information with : Family Supports, Magazine features editoracility Contact Representative                Emotional Assessment Appearance:: Appears older than stated age     Orientation: : Oriented to Self Alcohol / Substance Use: Not Applicable Psych Involvement: Yes (comment), Outpatient Provider  Admission diagnosis:  Urinary tract infection without hematuria, site unspecified [N39.0] Altered mental status, unspecified altered mental status type [R41.82] Patient Active Problem List   Diagnosis Date Noted  . UTI (urinary tract infection) 12/03/2018  . Hypothyroidism 12/03/2018  . HTN (hypertension) 12/03/2018  . COPD (chronic obstructive pulmonary disease) (HCC) 12/03/2018  . Diabetes (HCC) 12/03/2018  . Altered mental status 12/03/2018  . Schizoaffective disorder, bipolar type (HCC) 09/11/2015  . Aggression   . Episode of behavior change   . Psychoses (  [place])    PCP:  Carrie American, FNP Pharmacy:  No Pharmacies Listed    Social Determinants of Health (SDOH) Interventions    Readmission Risk Interventions No flowsheet data found.

## 2018-12-03 NOTE — Progress Notes (Signed)
Pt refusing blood draw and nasal swab for MRSA (lives in [place]). Attempted to educate pt about need for labs and reason for nasal swab but pt is talking nonsensical and refusing treatment. Dorna Bloom RN

## 2018-12-03 NOTE — H&P (Signed)
[facility] NAME: Carrie Clark    MR#:  [id number]  DATE OF BIRTH:  August 07, 1960  DATE OF ADMISSION:  12/02/2018  PRIMARY CARE PHYSICIAN: Carlos American, FNP   REQUESTING/REFERRING PHYSICIAN: Cinda Quest, MD  CHIEF COMPLAINT:   Chief Complaint  Patient presents with  . Hypoglycemia    HISTORY OF PRESENT ILLNESS:  Carrie Clark  is a 58 y.o. female who presents with chief complaint as above.  Patient presented to the ED with an acute episode of confusion and combativeness, as well as hypoglycemia.  Patient reportedly became confused and combative at the care home where she stays, and then was found to have a glucose of 33 when EMS arrived.  She was able to eat something and her glucose corrected.  Here in the ED she is still confused, though not combative.  She does have a history of schizoaffective disease.  Evaluation in the ED shows UTI.  Hospitalist were called for admission  PAST MEDICAL HISTORY:   Past Medical History:  Diagnosis Date  . COPD (chronic obstructive pulmonary disease) ([place])   . CVA (cerebral infarction)   . Depression   . Hypertension   . Hypothyroidism   . Schizophrenia ([place])   . Stroke ([place])   . Tardive dyskinesia      PAST SURGICAL HISTORY:   Past Surgical History:  Procedure Laterality Date  . NO PAST SURGERIES       SOCIAL HISTORY:   Social History   Tobacco Use  . Smoking status: Current Every Day Smoker    Packs/day: 1.00    Types: Cigarettes  . Smokeless tobacco: Never Used  Substance Use Topics  . Alcohol use: No     FAMILY HISTORY:   Family History  Family history unknown: Yes    DRUG ALLERGIES:   Allergies  Allergen Reactions  . Fish Allergy Other (See Comments)    Allergy reaction not listed- on [facility]    MEDICATIONS AT HOME:   Prior to Admission medications   Medication Sig Start Date End Date Taking? Authorizing Provider  atorvastatin (LIPITOR) 40 MG  tablet Take 40 mg by mouth daily.    Yes [provider]  benztropine (COGENTIN) 0.5 MG tablet Take 0.5 mg by mouth at bedtime.   Yes [provider]  cetirizine (ZYRTEC) 10 MG tablet Take 1 tablet (10 mg total) by mouth daily. 11/11/14  Yes Withrow, Elyse Jarvis, FNP  donepezil (ARICEPT) 5 MG tablet Take 5 mg by mouth at bedtime.   Yes [provider]  famotidine (PEPCID) 20 MG tablet Take 1 tablet (20 mg total) by mouth 2 (two) times daily. 11/11/14  Yes Withrow, Elyse Jarvis, FNP  fluPHENAZine (PROLIXIN) 10 MG tablet Take 1 tablet (10 mg total) by mouth at bedtime. Patient taking differently: Take 10 mg by mouth 3 (three) times daily.  11/11/14  Yes Withrow, Elyse Jarvis, FNP  fluticasone (FLONASE) 50 MCG/ACT nasal spray Place 50 sprays into both nostrils 2 (two) times daily as needed. Patient taking differently: Place 2 sprays into both nostrils every morning.  11/11/14  Yes Withrow, Elyse Jarvis, FNP  furosemide (LASIX) 20 MG tablet Take 1 tablet (20 mg total) by mouth daily. 11/11/14  Yes Withrow, John C, FNP  glucose blood test strip 1 each by Other route every morning. Use as instructed for blood sugar monitoring each morning before meals   Yes [provider]  Insulin Detemir (LEVEMIR) 100 UNIT/ML Pen Inject  25 Units into the skin 2 (two) times daily. Before breakfast and dinner    Yes [provider]  levothyroxine (SYNTHROID) 100 MCG tablet Take 100 mcg by mouth daily before breakfast.    Yes [provider]  lisinopril (PRINIVIL,ZESTRIL) 5 MG tablet Take 5 mg by mouth daily.   Yes [provider]  lurasidone (LATUDA) 20 MG TABS tablet Take 20 mg by mouth daily. Take 20 mg PO at 1700 with a meal   Yes [provider]  metFORMIN (GLUCOPHAGE) 1000 MG tablet Take 1 tablet (1,000 mg total) by mouth 2 (two) times daily. 09/11/15  Yes Charm RingsLord, Jamison Y, NP  mirtazapine (REMERON) 15 MG tablet Take 15 mg by mouth at bedtime.   Yes [provider]   Multiple Vitamins-Minerals (MULTIVITAMIN WITH MINERALS) tablet Take 1 tablet by mouth daily. 11/11/14  Yes Withrow, Everardo AllJohn C, FNP  Oxcarbazepine (TRILEPTAL) 300 MG tablet Take 1 tablet (300 mg total) by mouth 2 (two) times daily. 09/11/15  Yes Lord, Herminio HeadsJamison Y, NP  potassium chloride (K-DUR,KLOR-CON) 10 MEQ tablet Take 1 tablet (10 mEq total) by mouth daily. 11/11/14  Yes Withrow, Everardo AllJohn C, FNP  prazosin (MINIPRESS) 1 MG capsule Take 1 mg by mouth at bedtime.   Yes [provider]  tiotropium (SPIRIVA) 18 MCG inhalation capsule Place 18 mcg into inhaler and inhale daily.   Yes [provider]  zolpidem (AMBIEN) 5 MG tablet Take 5 mg by mouth at bedtime.   Yes [provider]  acetaminophen (TYLENOL) 325 MG tablet Take 325 mg by mouth every 6 (six) hours as needed for moderate pain.    [provider]  albuterol (PROVENTIL HFA;VENTOLIN HFA) 108 (90 Base) MCG/ACT inhaler Inhale 2 puffs into the lungs every 6 (six) hours as needed for wheezing or shortness of breath. 06/07/15   Mesner, Barbara CowerJason, MD  guaiFENesin (ROBITUSSIN) 100 MG/5ML SOLN Take 10 mLs by mouth every 4 (four) hours as needed for cough or to loosen phlegm.    [provider]  LORazepam (ATIVAN) 1 MG tablet Take 1 tablet (1 mg total) by mouth every 8 (eight) hours as needed for anxiety (agitation). Patient taking differently: Take 1 mg by mouth 2 (two) times daily as needed for anxiety (agitation).  09/11/15   Charm RingsLord, Jamison Y, NP  sodium chloride 1 g tablet Take 1 tablet (1 g total) by mouth 2 (two) times daily. Patient not taking: Reported on 12/02/2018 01/20/17   Kellie ShropshireShrosbree, Emily J, PA-C    REVIEW OF SYSTEMS:  Review of Systems  Unable to perform ROS: Acuity of condition     VITAL SIGNS:   Vitals:   12/02/18 2026 12/02/18 2027  BP: 116/70   Pulse: 85   Resp: 20   Temp: 98.3 F (36.8 C)   TempSrc: Oral   SpO2: 96%   Weight:  65.8 kg  Height:  5\' 3"  (1.6 m)   Wt Readings from Last 3  Encounters:  12/02/18 65.8 kg  01/01/18 65.8 kg  08/18/17 59 kg    PHYSICAL EXAMINATION:  Physical Exam  Vitals reviewed. Constitutional: She appears well-developed and well-nourished. No distress.  HENT:  Head: Normocephalic and atraumatic.  Mouth/Throat: Oropharynx is clear and moist.  Eyes: Pupils are equal, round, and reactive to light. Conjunctivae and EOM are normal. No scleral icterus.  Neck: Normal range of motion. Neck supple. No JVD present. No thyromegaly present.  Cardiovascular: Normal rate, regular rhythm and intact distal pulses. Exam reveals no gallop and  no friction rub.  No murmur heard. Respiratory: Effort normal and breath sounds normal. No respiratory distress. She has no wheezes. She has no rales.  GI: Soft. Bowel sounds are normal. She exhibits no distension. There is no abdominal tenderness.  Musculoskeletal: Normal range of motion.        General: No edema.     Comments: No arthritis, no gout  Lymphadenopathy:    She has no cervical adenopathy.  Neurological: She is alert. No cranial nerve deficit.  No dysarthria, no aphasia, unable to completely assess due to patient condition  Skin: Skin is warm and dry. No rash noted. No erythema.  Psychiatric:  Unable to assess due to patient condition    LABORATORY PANEL:   CBC Recent Labs  Lab 12/02/18 2210  WBC 17.8*  HGB 10.5*  HCT 32.2*  PLT 312   ------------------------------------------------------------------------------------------------------------------  Chemistries  Recent Labs  Lab 12/02/18 2210  NA 135  K 3.8  CL 100  CO2 23  GLUCOSE 267*  BUN 25*  CREATININE 0.79  CALCIUM 9.1  AST 25  ALT 18  ALKPHOS 73  BILITOT 0.5   ------------------------------------------------------------------------------------------------------------------  Cardiac Enzymes No results for input(s): TROPONINI in the last 168  hours. ------------------------------------------------------------------------------------------------------------------  RADIOLOGY:  Ct Head Wo Contrast  Result Date: 12/02/2018 CLINICAL DATA:  Altered level of consciousness EXAM: CT HEAD WITHOUT CONTRAST TECHNIQUE: Contiguous axial images were obtained from the base of the skull through the vertex without intravenous contrast. COMPARISON:  02/04/2016 FINDINGS: Brain: No evidence of acute infarction, hemorrhage, hydrocephalus, extra-axial collection or mass lesion/mass effect. Vascular: No hyperdense vessel or unexpected calcification. Skull: Normal. Negative for fracture or focal lesion. Sinuses/Orbits: No acute finding. Other: None. IMPRESSION: Normal head CT for age Electronically Signed   By: Alcide CleverMark  Lukens M.D.   On: 12/02/2018 21:45   Dg Chest Portable 1 View  Result Date: 12/02/2018 CLINICAL DATA:  AMS EXAM: PORTABLE CHEST 1 VIEW COMPARISON:  07/03/2015 FINDINGS: The heart size and mediastinal contours are within normal limits. Both lungs are clear. The visualized skeletal structures are unremarkable. IMPRESSION: No acute abnormality of the lungs in AP portable projection. Electronically Signed   By: Lauralyn PrimesAlex  Bibbey M.D.   On: 12/02/2018 21:27    EKG:   Orders placed or performed during the hospital encounter of 12/02/18  . ED EKG  . ED EKG    IMPRESSION AND PLAN:  Principal Problem:   UTI (urinary tract infection) -patient had nitrites positive on UA.  IV antibiotics started.  Urine culture ordered Active Problems:   Schizoaffective disorder, bipolar type (HCC) -continue home meds.  Psychiatry consult was placed by ED physician   HTN (hypertension) -continue home dose antihypertensives   COPD (chronic obstructive pulmonary disease) (HCC) -home dose inhalers   Diabetes (HCC) -sliding scale insulin coverage   Hypothyroidism -home dose thyroid replacement  Chart review performed and case discussed with ED provider. Labs, imaging and/or  ECG reviewed by provider and discussed with patient/family. Management plans discussed with the patient and/or family.  COVID-19 status: Test pending  DVT PROPHYLAXIS: SubQ lovenox   GI PROPHYLAXIS:  None  ADMISSION STATUS: Inpatient     CODE STATUS: Full Code Status History    Date Active Date Inactive Code Status Order ID Comments User Context   09/07/2015 1614 09/11/2015 2108 Full Code [id number]  Marily MemosMesner, Jason, MD ED   11/11/2014 0319 11/11/2014 2030 Full Code [id number]  Antony MaduraHumes, Kelly, PA-C ED   10/07/2014 1206 10/10/2014 2041 Full Code [id number]  Benjiman CorePickering, Nathan, MD ED   Advance Care Planning Activity      TOTAL TIME TAKING CARE OF THIS PATIENT: 45 minutes.   This patient was evaluated in the context of the global COVID-19 pandemic, which necessitated consideration that the patient might be at risk for infection with the SARS-CoV-2 virus that causes COVID-19. Institutional protocols and algorithms that pertain to the evaluation of patients at risk for COVID-19 are in a state of rapid change based on information released by regulatory bodies including the CDC and federal and state organizations. These policies and algorithms were followed to the best of this provider's knowledge to date during the patient's care at this facility.  Barney DrainDavid F Radie Berges 12/03/2018, 1:56 AM  [facility]  Office  [phone]  CC: Primary care physician; Duffy RhodyPartridge, Tanillya, FNP  Note:  This document was prepared using Dragon voice recognition software and may include unintentional dictation errors.

## 2018-12-03 NOTE — TOC Progression Note (Signed)
Transition of Care [facility]) - Progression Note    Patient Details  Name: Valorie Mcgrory MRN: [id number] Date of Birth: 1961/03/01  [facility] CM/SW Contact  Shelbie Hutching, RN Phone Number: 12/03/2018, 10:20 AM  Clinical Narrative:    Patient's sister returned call.  RNCM updated sister on plan of care.  MD reports that patient can discharge back to care facility today.     Expected Discharge Plan: Home/Self Care(Group home) Barriers to Discharge: Continued Medical Work up  Expected Discharge Plan and Services Expected Discharge Plan: Home/Self Care(Group home)   Discharge Planning Services: CM Consult   Living arrangements for the past 2 months: Group Home                                       Social Determinants of Health (SDOH) Interventions    Readmission Risk Interventions No flowsheet data found.

## 2018-12-03 NOTE — ED Notes (Signed)
ED TO INPATIENT HANDOFF REPORT  ED Nurse Name and Phone #: Berline Lopesgracie [phone]  S Name/Age/Gender Carrie Clark 58 y.o. female Room/Bed: ED04A/ED04A  Code Status   Code Status: Prior  Home/SNF/Other Home Patient oriented to: self and place Is this baseline? No   Triage Complete: Triage complete  Chief Complaint Hypoglycemia  Triage Note Pt to ED via EMS from [facility] assisted [facility]. Per ems on arrival pt was combative on floor with cbg od 33. EMS gave 1 amp glucagon and pt ate sandwich and pepsi. EMS got cbg to 144, cbg 289 on arrival. Pt has extensive psych hx, ems states they do not believe pt has been taking meds regularly. Pt arrives yelling and being verbally abusive. Pt refusing bloodwork.    Allergies Allergies  Allergen Reactions  . Fish Allergy Other (See Comments)    Allergy reaction not listed- on MAR    Level of Care/Admitting Diagnosis ED Disposition    ED Disposition Condition Comment   Admit  [facility]ospital Area: [facility] [[id number]]  Level of Care: Med-Surg [16]  Covid Evaluation: Asymptomatic Screening Protocol (No Symptoms)  Diagnosis: UTI (urinary tract infection) [147829][[id number]]  Admitting Physician: Oralia ManisWILLIS, DAVID [[id number]]  Attending Physician: Oralia ManisWILLIS, DAVID [phone][[id number]]  Estimated length of stay: past midnight tomorrow  Certification:: I certify this patient will need inpatient services for at least 2 midnights  PT Class (Do Not Modify): Inpatient [101]  PT Acc Code (Do Not Modify): Private [1]       B Medical/Surgery History Past Medical History:  Diagnosis Date  . COPD (chronic obstructive pulmonary disease) (H[facility])   . CVA (cerebral infarction)   . Depression   . Hypertension   . Hypothyroidism   . Schizophrenia (H[facility])   . Stroke (HCC)   . Tardive dyskinesia    Past Surgical History:  Procedure Laterality Date  . NO PAST SURGERIES       A IV Location/Drains/Wounds Patient Lines/Drains/Airways Status   Active  Line/Drains/Airways    Name:   Placement date:   Placement time:   Site:   Days:   Peripheral IV 12/03/18 Left Hand   12/03/18    0004    Hand   less than 1          Intake/Output Last 24 hours  Intake/Output Summary (Last 24 hours) at 12/03/2018 0231 Last data filed at 12/03/2018 0126 Gross per 24 hour  Intake 260 ml  Output -  Net 260 ml    Labs/Imaging Results for orders placed or performed during the hospital encounter of 12/02/18 (from the past 48 hour(s))  Glucose, capillary     Status: Abnormal   Collection Time: 12/02/18  8:26 PM  Result Value Ref Range   Glucose-Capillary 289 (H) 70 - 99 mg/dL  Comprehensive metabolic panel     Status: Abnormal   Collection Time: 12/02/18 10:10 PM  Result Value Ref Range   Sodium 135 135 - 145 mmol/L   Potassium 3.8 3.5 - 5.1 mmol/L   Chloride 100 98 - 111 mmol/L   CO2 23 22 - 32 mmol/L   Glucose, Bld 267 (H) 70 - 99 mg/dL   BUN 25 (H) 6 - 20 mg/dL   Creatinine, Ser 6.290.79 0.44 - 1.00 mg/dL   Calcium 9.1 8.9 - 52.810.3 mg/dL   Total Protein 5.9 (L) 6.5 - 8.1 g/dL   Albumin 3.3 (L) 3.5 - 5.0 g/dL   AST 25 15 - 41 U/L   ALT 18 0 - 44 U/L  Alkaline Phosphatase 73 38 - 126 U/L   Total Bilirubin 0.5 0.3 - 1.2 mg/dL   GFR calc non Af Amer >60 >60 mL/min   GFR calc Af Amer >60 >60 mL/min   Anion gap 12 5 - 15    Comment: Performed at [facility], [address] [place], [place] [address]  CBC with Differential     Status: Abnormal   Collection Time: 12/02/18 10:10 PM  Result Value Ref Range   WBC 17.8 (H) 4.0 - 10.5 K/uL   RBC 3.46 (L) 3.87 - 5.11 MIL/uL   Hemoglobin 10.5 (L) 12.0 - 15.0 g/dL   HCT 60.432.2 (L) 54.036.0 - 98.146.0 %   MCV 93.1 80.0 - 100.0 fL   MCH 30.3 26.0 - 34.0 pg   MCHC 32.6 30.0 - 36.0 g/dL   RDW 19.113.4 47.811.5 - 29.515.5 %   Platelets 312 150 - 400 K/uL   nRBC 0.0 0.0 - 0.2 %   Neutrophils Relative % 76 %   Neutro Abs 13.5 (H) 1.7 - 7.7 K/uL   Lymphocytes Relative 16 %   Lymphs Abs 2.8 0.7 - 4.0 K/uL   Monocytes  Relative 6 %   Monocytes Absolute 1.1 (H) 0.1 - 1.0 K/uL   Eosinophils Relative 2 %   Eosinophils Absolute 0.3 0.0 - 0.5 K/uL   Basophils Relative 0 %   Basophils Absolute 0.1 0.0 - 0.1 K/uL   Immature Granulocytes 0 %   Abs Immature Granulocytes 0.06 0.00 - 0.07 K/uL    Comment: Performed at [facility], [address] [place], [place] [address]  Urinalysis, Complete w Microscopic     Status: Abnormal   Collection Time: 12/02/18 11:10 PM  Result Value Ref Range   Color, Urine YELLOW (A) YELLOW   APPearance CLEAR (A) CLEAR   Specific Gravity, Urine 1.013 1.005 - 1.030   pH 5.0 5.0 - 8.0   Glucose, UA NEGATIVE NEGATIVE mg/dL   Hgb urine dipstick NEGATIVE NEGATIVE   Bilirubin Urine NEGATIVE NEGATIVE   Ketones, ur NEGATIVE NEGATIVE mg/dL   Protein, ur NEGATIVE NEGATIVE mg/dL   Nitrite POSITIVE (A) NEGATIVE   Leukocytes,Ua SMALL (A) NEGATIVE   RBC / HPF 0-5 0 - 5 RBC/hpf   WBC, UA 6-10 0 - 5 WBC/hpf   Bacteria, UA RARE (A) NONE SEEN   Squamous Epithelial / LPF NONE SEEN 0 - 5   WBC Clumps PRESENT    Hyaline Casts, UA PRESENT     Comment: Performed at [facility], [address] [place], [place] [address]  Pregnancy, urine     Status: None   Collection Time: 12/02/18 11:10 PM  Result Value Ref Range   Preg Test, Ur NEGATIVE NEGATIVE    Comment: Performed at [facility], [address] [place], [place] [address]  SARS Coronavirus 2 (CEPHEID - Performed in [facility] hospital lab), Hosp Order     Status: None   Collection Time: 12/03/18  1:26 AM   Specimen: Nasopharyngeal Swab  Result Value Ref Range   SARS Coronavirus 2 NEGATIVE NEGATIVE    Comment: (NOTE) If result is NEGATIVE SARS-CoV-2 target nucleic acids are NOT DETECTED. The SARS-CoV-2 RNA is generally detectable in upper and lower  respiratory specimens during the acute phase of infection. The lowest  concentration of SARS-CoV-2 viral copies this assay can detect is 250  copies / mL.  A negative result does not preclude SARS-CoV-2 infection  and should not be used as the  sole basis for treatment or other  patient management decisions.  A negative result may occur with  improper specimen collection / handling, submission of specimen other  than nasopharyngeal swab, presence of viral mutation(s) within the  areas targeted by this assay, and inadequate number of viral copies  (<250 copies / mL). A negative result must be combined with clinical  observations, patient history, and epidemiological information. If result is POSITIVE SARS-CoV-2 target nucleic acids are DETECTED. The SARS-CoV-2 RNA is generally detectable in upper and lower  respiratory specimens dur ing the acute phase of infection.  Positive  results are indicative of active infection with SARS-CoV-2.  Clinical  correlation with patient history and other diagnostic information is  necessary to determine patient infection status.  Positive results do  not rule out bacterial infection or co-infection with other viruses. If result is PRESUMPTIVE POSTIVE SARS-CoV-2 nucleic acids MAY BE PRESENT.   A presumptive positive result was obtained on the submitted specimen  and confirmed on repeat testing.  While 2019 novel coronavirus  (SARS-CoV-2) nucleic acids may be present in the submitted sample  additional confirmatory testing may be necessary for epidemiological  and / or clinical management purposes  to differentiate between  SARS-CoV-2 and other Sarbecovirus currently known to infect humans.  If clinically indicated additional testing with an alternate test  methodology [phone]) is advised. The SARS-CoV-2 RNA is generally  detectable in upper and lower respiratory sp ecimens during the acute  phase of infection. The expected result is Negative. Fact Sheet for Patients:  StrictlyIdeas.no Fact Sheet for Healthcare Providers: BankingDealers.co.za This test is not  yet approved or cleared by the [place] FDA and has been authorized for detection and/or diagnosis of SARS-CoV-2 by FDA under an Emergency Use Authorization (EUA).  This EUA will remain in effect (meaning this test can be used) for the duration of the COVID-19 declaration under Section 564(b)(1) of the Act, 21 U.S.C. section 360bbb-3(b)(1), unless the authorization is terminated or revoked sooner. Performed at [facility], [place], [place] [address]    Ct Head Wo Contrast  Result Date: 12/02/2018 CLINICAL DATA:  Altered level of consciousness EXAM: CT HEAD WITHOUT CONTRAST TECHNIQUE: Contiguous axial images were obtained from the base of the skull through the vertex without intravenous contrast. COMPARISON:  02/04/2016 FINDINGS: Brain: No evidence of acute infarction, hemorrhage, hydrocephalus, extra-axial collection or mass lesion/mass effect. Vascular: No hyperdense vessel or unexpected calcification. Skull: Normal. Negative for fracture or focal lesion. Sinuses/Orbits: No acute finding. Other: None. IMPRESSION: Normal head CT for age Electronically Signed   By: Inez Catalina M.D.   On: 12/02/2018 21:45   Dg Chest Portable 1 View  Result Date: 12/02/2018 CLINICAL DATA:  AMS EXAM: PORTABLE CHEST 1 VIEW COMPARISON:  07/03/2015 FINDINGS: The heart size and mediastinal contours are within normal limits. Both lungs are clear. The visualized skeletal structures are unremarkable. IMPRESSION: No acute abnormality of the lungs in AP portable projection. Electronically Signed   By: Eddie Candle M.D.   On: 12/02/2018 21:27    Pending Labs Unresulted Labs (From admission, onward)    Start     Ordered   12/03/18 0133  Urine Culture  Add-on,   AD     12/03/18 0132   Signed and Held  HIV antibody (Routine Testing)  Once,   R     Signed and Held   Signed and Held  CBC  (enoxaparin (LOVENOX)    CrCl >/= 30 ml/min)  Once,   R    Comments: Baseline for enoxaparin therapy IF  NOT ALREADY DRAWN.  Notify MD if PLT < 100 K.    Signed and Held   Signed and Held  Creatinine, serum  (enoxaparin (LOVENOX)    CrCl >/= 30 ml/min)  Once,   R    Comments: Baseline for enoxaparin therapy IF NOT ALREADY DRAWN.    Signed and Held   Signed and Held  Creatinine, serum  (enoxaparin (LOVENOX)    CrCl >/= 30 ml/min)  Weekly,   R    Comments: while on enoxaparin therapy    Signed and Held   Signed and Held  Basic metabolic panel  Tomorrow morning,   R     Signed and Held   Signed and Held  CBC  Tomorrow morning,   R     Signed and Held          Vitals/Pain Today's Vitals   12/02/18 2026 12/02/18 2027  BP: 116/70   Pulse: 85   Resp: 20   Temp: 98.3 F (36.8 C)   TempSrc: Oral   SpO2: 96%   Weight:  65.8 kg  Height:  5\' 3"  (1.6 m)  PainSc:  0-No pain    Isolation Precautions No active isolations  Medications Medications  haloperidol lactate (HALDOL) injection 5 mg (0 mg Intramuscular Hold 12/02/18 2316)  cephALEXin (KEFLEX) capsule 500 mg (500 mg Oral Given 12/02/18 2353)  haloperidol lactate (HALDOL) injection 5 mg (5 mg Intramuscular Given 12/02/18 2102)  LORazepam (ATIVAN) injection 2 mg (2 mg Intramuscular Given 12/02/18 2102)  cefTRIAXone (ROCEPHIN) 1 g in sodium chloride 0.9 % 100 mL IVPB (0 g Intravenous Stopped 12/03/18 0126)    Mobility walks with person assist High fall risk   Focused Assessments Neuro Assessment Handoff:  Swallow screen pass? n/a         Neuro Assessment:   Neuro Checks:      Last Documented NIHSS Modified Score:   Has TPA been given? No If patient is a Neuro Trauma and patient is going to OR before floor call report to 4N Charge nurse: [phone] or [phone]     R Recommendations: See Admitting Provider Note  Report given to:   Additional Notes:

## 2018-12-03 NOTE — [place] FL2 (Signed)
[place] MEDICAID FL2 LEVEL OF CARE SCREENING TOOL     IDENTIFICATION  Patient Name: Carrie LovelessJoy Hendricks Birthdate: 1960-12-02 Sex: female Admission Date (Current Location): 12/02/2018  [place]   Facility and Address:  [facility], [address], [place], [place] [address]      Provider Number: [id number]  Attending Physician Name and Address:  Adrian SaranMody, Sital, MD  Relative Name and Phone Number:  Orest DikesFrancis Glaze [phone]    Current Level of Care: Hospital Recommended Level of Care: Family Care Home Prior Approval Number:    Date Approved/Denied:   PASRR Number:    Discharge Plan: (Group home/ Family Care Home)    Current Diagnoses: Patient Active Problem List   Diagnosis Date Noted  . UTI (urinary tract infection) 12/03/2018  . Hypothyroidism 12/03/2018  . HTN (hypertension) 12/03/2018  . COPD (chronic obstructive pulmonary disease) (HCC) 12/03/2018  . Diabetes (HCC) 12/03/2018  . Altered mental status 12/03/2018  . Schizoaffective disorder, bipolar type (HCC) 09/11/2015  . Aggression   . Episode of behavior change   . Psychoses (HCC)     Orientation RESPIRATION BLADDER Height & Weight     Self, Place  Normal Incontinent Weight: 65.8 kg Height:  5\' 3"  (160 cm)  BEHAVIORAL SYMPTOMS/MOOD NEUROLOGICAL BOWEL NUTRITION STATUS      Continent Diet  AMBULATORY STATUS COMMUNICATION OF NEEDS Skin   Supervision Verbally Normal                       Personal Care Assistance Level of Assistance  Bathing, Feeding, Dressing Bathing Assistance: Limited assistance Feeding assistance: Limited assistance Dressing Assistance: Limited assistance     Functional Limitations Info             SPECIAL CARE FACTORS FREQUENCY                       Contractures Contractures Info: Not present    Additional Factors Info  Code Status, Allergies Code Status Info: Full Allergies Info: Fish            Current Medications (12/03/2018):  This is the current hospital active medication list Current Facility-Administered Medications  Medication Dose Route Frequency Provider Last Rate Last Dose  . acetaminophen (TYLENOL) tablet 650 mg  650 mg Oral Q6H PRN Oralia ManisWillis, David, MD       Or  . acetaminophen (TYLENOL) suppository 650 mg  650 mg Rectal Q6H PRN Oralia ManisWillis, David, MD      . atorvastatin (LIPITOR) tablet 40 mg  40 mg Oral Daily Oralia ManisWillis, David, MD   40 mg at 12/03/18 0913  . benztropine (COGENTIN) tablet 0.5 mg  0.5 mg Oral QHS Oralia ManisWillis, David, MD      . Melene Muller[START ON 12/04/2018] cefTRIAXone (ROCEPHIN) 1 g in sodium chloride 0.9 % 100 mL IVPB  1 g Intravenous Q24H Oralia ManisWillis, David, MD      . donepezil (ARICEPT) tablet 5 mg  5 mg Oral QHS Oralia ManisWillis, David, MD      . enoxaparin (LOVENOX) injection 40 mg  40 mg Subcutaneous Q24H Oralia ManisWillis, David, MD   40 mg at 12/03/18 [id number]  . famotidine (PEPCID) tablet 20 mg  20 mg Oral BID Oralia ManisWillis, David, MD   20 mg at 12/03/18 0914  . fluPHENAZine (PROLIXIN) tablet 10 mg  10 mg Oral TID Oralia ManisWillis, David, MD   10 mg at 12/03/18 0913  . haloperidol lactate (HALDOL) injection 5 mg  5 mg  Intramuscular Once Lance Coon, MD   Stopped at 12/02/18 2316  . insulin aspart (novoLOG) injection 0-5 Units  0-5 Units Subcutaneous QHS Lance Coon, MD   5 Units at 12/03/18 [id number]  . insulin aspart (novoLOG) injection 0-9 Units  0-9 Units Subcutaneous TID WC Lance Coon, MD      . levothyroxine (SYNTHROID) tablet 100 mcg  100 mcg Oral QAC breakfast Lance Coon, MD      . LORazepam (ATIVAN) tablet 1 mg  1 mg Oral BID PRN Lance Coon, MD   1 mg at 12/03/18 0305  . lurasidone (LATUDA) tablet 20 mg  20 mg Oral Daily Lance Coon, MD      . mirtazapine (REMERON) tablet 15 mg  15 mg Oral Corwin Levins, MD      . ondansetron [facility]) tablet 4 mg  4 mg Oral Q6H PRN Lance Coon, MD       Or  . ondansetron [facility]) injection 4 mg  4 mg Intravenous Q6H PRN Lance Coon, MD      . Derrill Memo ON  12/04/2018] Oxcarbazepine (TRILEPTAL) tablet 300 mg  300 mg Oral BID Lance Coon, MD      . tiotropium [facility]) inhalation capsule ([facility] use ONLY) 18 mcg  18 mcg Inhalation Daily Lance Coon, MD   18 mcg at 12/03/18 0762     Discharge Medications: Please see discharge summary for a list of discharge medications.  Relevant Imaging Results:  Relevant Lab Results:   Additional Information    Shelbie Hutching, RN

## 2018-12-03 NOTE — Plan of Care (Signed)
Pt is returning to [facility].  + for UTI and MRSA.  Will go home on abx

## 2018-12-03 NOTE — Care Management CC44 (Signed)
Condition Code 44 Documentation Completed  Patient Details  Name: Deneene Tarver MRN: [id number] Date of Birth: 1960/12/29   Condition Code 44 given:  Yes Patient signature on Condition Code 44 notice:  Yes Documentation of 2 MD's agreement:  Yes Code 44 added to claim:  Yes    Shelbie Hutching, RN 12/03/2018, 12:10 PM

## 2018-12-03 NOTE — Care Management Obs Status (Signed)
[facility] NOTIFICATION   Patient Details  Name: Felma Pfefferle MRN: [id number] Date of Birth: [birth year]   Medicare Observation Status Notification Given:  Yes    Shelbie Hutching, RN 12/03/2018, 12:10 PM

## 2018-12-04 LAB — HIV ANTIBODY (ROUTINE TESTING W REFLEX): HIV Screen 4th Generation wRfx: NONREACTIVE

## 2018-12-04 LAB — URINE CULTURE

## 2018-12-06 ENCOUNTER — Emergency Department
Admission: EM | Admit: 2018-12-06 | Discharge: 2018-12-06 | Disposition: A | Discharge status: Home / Self Care | Payer: Medicare Other | Attending: Emergency Medicine | Admitting: Emergency Medicine

## 2018-12-06 ENCOUNTER — Other Ambulatory Visit: Payer: Self-pay

## 2018-12-06 ENCOUNTER — Encounter: Payer: Self-pay | Admitting: Intensive Care

## 2018-12-06 DIAGNOSIS — F17210 Nicotine dependence, cigarettes, uncomplicated: Secondary | ICD-10-CM | POA: Insufficient documentation

## 2018-12-06 DIAGNOSIS — F250 Schizoaffective disorder, bipolar type: Secondary | ICD-10-CM | POA: Diagnosis present

## 2018-12-06 DIAGNOSIS — Z794 Long term (current) use of insulin: Secondary | ICD-10-CM | POA: Insufficient documentation

## 2018-12-06 DIAGNOSIS — R45851 Suicidal ideations: Secondary | ICD-10-CM | POA: Diagnosis not present

## 2018-12-06 DIAGNOSIS — J449 Chronic obstructive pulmonary disease, unspecified: Secondary | ICD-10-CM | POA: Diagnosis not present

## 2018-12-06 DIAGNOSIS — Z79899 Other long term (current) drug therapy: Secondary | ICD-10-CM | POA: Diagnosis not present

## 2018-12-06 DIAGNOSIS — I10 Essential (primary) hypertension: Secondary | ICD-10-CM | POA: Insufficient documentation

## 2018-12-06 DIAGNOSIS — Z8673 Personal history of transient ischemic attack (TIA), and cerebral infarction without residual deficits: Secondary | ICD-10-CM | POA: Diagnosis not present

## 2018-12-06 DIAGNOSIS — F32A Depression, unspecified: Secondary | ICD-10-CM

## 2018-12-06 DIAGNOSIS — E039 Hypothyroidism, unspecified: Secondary | ICD-10-CM | POA: Insufficient documentation

## 2018-12-06 DIAGNOSIS — F329 Major depressive disorder, single episode, unspecified: Secondary | ICD-10-CM | POA: Diagnosis not present

## 2018-12-06 HISTORY — DX: Schizoaffective disorder, bipolar type: F25.0

## 2018-12-06 LAB — COMPREHENSIVE METABOLIC PANEL
ALT: 22 U/L (ref 0–44)
AST: 30 U/L (ref 15–41)
Albumin: 3.7 g/dL (ref 3.5–5.0)
Alkaline Phosphatase: 75 U/L (ref 38–126)
Anion gap: 14 (ref 5–15)
BUN: 15 mg/dL (ref 6–20)
CO2: 23 mmol/L (ref 22–32)
Calcium: 9.3 mg/dL (ref 8.9–10.3)
Chloride: 101 mmol/L (ref 98–111)
Creatinine, Ser: 0.85 mg/dL (ref 0.44–1.00)
GFR calc Af Amer: >60 mL/min (ref >60)
GFR calc non Af Amer: >60 mL/min (ref >60)
Glucose, Bld: 162 mg/dL — ABNORMAL HIGH (ref 70–99)
Potassium: 4.2 mmol/L (ref 3.5–5.1)
Sodium: 138 mmol/L (ref 135–145)
Total Bilirubin: 0.3 mg/dL (ref 0.3–1.2)
Total Protein: 6.5 g/dL (ref 6.5–8.1)

## 2018-12-06 LAB — AMMONIA: Ammonia: 12 umol/L (ref 9–35)

## 2018-12-06 LAB — CBC
HCT: 35.3 % — ABNORMAL LOW (ref 36.0–46.0)
Hemoglobin: 11.4 g/dL — ABNORMAL LOW (ref 12.0–15.0)
MCH: 30.2 pg (ref 26.0–34.0)
MCHC: 32.3 g/dL (ref 30.0–36.0)
MCV: 93.6 fL (ref 80.0–100.0)
Platelets: 357 10*3/uL (ref 150–400)
RBC: 3.77 MIL/uL — ABNORMAL LOW (ref 3.87–5.11)
RDW: 13.4 % (ref 11.5–15.5)
WBC: 12.9 10*3/uL — ABNORMAL HIGH (ref 4.0–10.5)
nRBC: 0 % (ref 0.0–0.2)

## 2018-12-06 LAB — SALICYLATE LEVEL: Salicylate Lvl: <7 mg/dL (ref 2.8–30.0)

## 2018-12-06 LAB — TSH: TSH: 4.075 u[IU]/mL (ref 0.350–4.500)

## 2018-12-06 LAB — ACETAMINOPHEN LEVEL: Acetaminophen (Tylenol), Serum: <10 ug/mL — ABNORMAL LOW (ref 10–30)

## 2018-12-06 MED ORDER — BENZTROPINE MESYLATE 1 MG PO TABS: 0.5000 mg | ORAL_TABLET | Freq: Every day | ORAL | Status: DC

## 2018-12-06 MED ORDER — LURASIDONE HCL 40 MG PO TABS
20.0000 mg | ORAL_TABLET | Freq: Every day | ORAL | Status: DC
  Administered 2018-12-06: 20 mg via ORAL
  Filled 2018-12-06: qty 1

## 2018-12-06 MED ORDER — LEVOTHYROXINE SODIUM 50 MCG PO TABS: 100.0000 ug | ORAL_TABLET | Freq: Every day | ORAL | Status: DC

## 2018-12-06 MED ORDER — FUROSEMIDE 40 MG PO TABS
20.0000 mg | ORAL_TABLET | Freq: Every day | ORAL | Status: DC
  Administered 2018-12-06: 20 mg via ORAL
  Filled 2018-12-06: qty 1

## 2018-12-06 MED ORDER — CEPHALEXIN 500 MG PO CAPS
500.0000 mg | ORAL_CAPSULE | Freq: Two times a day (BID) | ORAL | Status: DC
  Administered 2018-12-06: 14:00:00 500 mg via ORAL
  Filled 2018-12-06: qty 1

## 2018-12-06 MED ORDER — METFORMIN HCL 500 MG PO TABS
1000.0000 mg | ORAL_TABLET | Freq: Two times a day (BID) | ORAL | Status: DC
  Administered 2018-12-06: 1000 mg via ORAL
  Filled 2018-12-06: qty 2

## 2018-12-06 MED ORDER — DONEPEZIL HCL 5 MG PO TABS: 5.0000 mg | ORAL_TABLET | Freq: Every day | ORAL | Status: DC

## 2018-12-06 MED ORDER — FLUPHENAZINE HCL 5 MG PO TABS
10.0000 mg | ORAL_TABLET | Freq: Three times a day (TID) | ORAL | Status: DC
  Administered 2018-12-06: 10 mg via ORAL
  Filled 2018-12-06 (×2): qty 2

## 2018-12-06 NOTE — ED Notes (Signed)
Pt's guardian Manus Gunning was notified of pt's discharge.

## 2018-12-06 NOTE — ED Notes (Addendum)
DSS rep Lambert Keto called, took info, and said she would pass it along to [employer], who would try to reach the care home. She said she did not know when that would be.

## 2018-12-06 NOTE — ED Notes (Signed)
Carrie Clark, the on-call worker from [place], said she spoke with someone at [phone] who said that someone from [facility] would come pick up the pt. She did not have an ETA.

## 2018-12-06 NOTE — ED Notes (Signed)
Pt given meal tray.

## 2018-12-06 NOTE — ED Notes (Signed)
Patient is screaming out, "I want out! I want out!" Pt advised of the need for appropriate behavior and that attempts are being made to get her a ride as soon as possible. Will continue to monitor for needs/safety.

## 2018-12-06 NOTE — ED Notes (Signed)
Notified Elmyra Ricks at [facility] that call will be placed to DSS. Have called [employer] and am awaiting return call.

## 2018-12-06 NOTE — ED Notes (Addendum)
Spoke with Elmyra Ricks, staff member at [facility] [phone]). She says she is the only staff at the home and cannot leave. Was unable to leave msg at [phone] -- call rang and rang/ was not set up for voice mail. Called Elmyra Ricks back; she gave [phone], which also rang without being set up for voice mail. Called [phone] and Elmyra Ricks picked up again. Informed her of the home's responsibility to pick up a discharged pt and that adult services could be contacted. Will continue to monitor for needs/safety.

## 2018-12-06 NOTE — ED Triage Notes (Signed)
Patient arrived by EMS from [facility]. Patient states "I did want to hurt myself this morning but not anymore" Denies SI/HI. Denies plan this morning to hurt herself. HX schizaffective bipolar, tardive dyskinesia, schizophrenia, depression. Steady gait. Speaks loudly with emotion

## 2018-12-06 NOTE — ED Notes (Signed)
Spoke with the on-call [place] worker, Maurice Small, who said she would speak with her supervisor and call me back. Called the [phone] number provided; msg said to call back during weekday business hours.

## 2018-12-06 NOTE — ED Notes (Signed)
Carrie Clark, a Education officer, museum from [place] called back. She said that [facility] is in [facility], there is nothing to be done on the county level, and referred this Probation officer to the state [phone]).

## 2018-12-06 NOTE — ED Notes (Signed)
Pt given dinner tray.

## 2018-12-06 NOTE — ED Provider Notes (Signed)
-----------------------------------------   4:08 PM on 12/06/2018 -----------------------------------------  Patient seen and evaluated by psychiatry.  They believe the patient is safe for discharge home from a psychiatric standpoint.  Patient's medical work-up is largely nonrevealing.  They will be resending the patient's IVC and we will discharge the patient home.   Harvest Dark, MD 12/06/18 [phone]

## 2018-12-06 NOTE — ED Notes (Signed)
[place] rep arrived to pick pt up. Pt leaves after receiving AVS and all belongings. Pt was given an opportunity to ask questions. Pt was ambulatory and in no acute distress when she got into the care home rep's car.

## 2018-12-06 NOTE — Discharge Instructions (Signed)
Suicidal Feelings: How to Help Yourself Suicide is when you end your own life. There are many things you can do to help yourself feel better when struggling with these feelings. Many services and people are available to support you and others who struggle with similar feelings.  If you ever feel like you may hurt yourself or others, or have thoughts about taking your own life, get help right away. To get help:  Call your local emergency services (911 in the [place]  [facility] [place] [address]'s health and human services helpline (211 in the [place]  Go to your nearest emergency department.  Call a suicide hotline to speak with a trained counselor. The following suicide hotlines are available in the [place]: ? 1-800-273-TALK 336 861 3887). ? 1-800-SUICIDE 765-446-6728). ? (782)088-1123. This is a hotline for Spanish speakers. ? (202) 468-0162. This is a hotline for TTY users. ? 1-866-4-U-TREVOR 346-829-7216). This is a hotline for lesbian, gay, bisexual, transgender, or questioning youth. ? For a list of hotlines in [place], visit ParkingAffiliatePrograms.se.html  Contact a crisis center or a local suicide prevention center. To find a crisis center or suicide prevention center: ? Call your local hospital, clinic, community service organization, mental health center, social service provider, or health department. Ask for help with connecting to a crisis center. ? For a list of crisis centers in the [place], visit: suicidepreventionlifeline.org ? For a list of crisis centers in [place], visit: suicideprevention.ca How to help yourself feel better   Promise yourself that you will not do anything extreme when you have suicidal feelings. Remember, there is hope. Many people have gotten through suicidal thoughts and feelings, and you can too. If you have had these feelings before, remind yourself that you can get through them again.  Let family,  friends, teachers, or counselors know how you are feeling. Try not to separate yourself from those who care about you and want to help you. Talk with someone every day, even if you do not feel sociable. Face-to-face conversation is best to help them understand your feelings.  Contact a mental health care provider and work with this person regularly.  Make a safety plan that you can follow during a crisis. Include phone numbers of suicide prevention hotlines, mental health professionals, and trusted friends and family members you can call during an emergency. Save these numbers on your phone.  If you are thinking of taking a lot of medicine, give your medicine to someone who can give it to you as prescribed. If you are on antidepressants and are concerned you will overdose, tell your health care provider so that he or she can give you safer medicines.  Try to stick to your routines. Follow a schedule every day. Make self-care a priority.  Make a list of realistic goals, and cross them off when you achieve them. Accomplishments can give you a sense of worth.  Wait until you are feeling better before doing things that you find difficult or unpleasant.  Do things that you have always enjoyed to take your mind off your feelings. Try reading a book, or listening to or playing music. Spending time outside, in nature, may help you feel better. Follow these instructions at home:   Visit your primary health care provider every year for a checkup.  Work with a mental health care provider as needed.  Eat a well-balanced diet, and eat regular meals.  Get plenty of rest.  Exercise if you are able. Just 30 minutes of exercise each day  can help you feel better.  Take over-the-counter and prescription medicines only as told by your health care provider. Ask your mental health care provider about the possible side effects of any medicines you are taking.  Do not use alcohol or drugs, and remove these  substances from your home.  Remove weapons, poisons, knives, and other deadly items from your home. General recommendations  Keep your living space well lit.  When you are feeling well, write yourself a letter with tips and support that you can read when you are not feeling well.  Remember that life's difficulties can be sorted out with help. Conditions can be treated, and you can learn behaviors and ways of thinking that will help you. Where to find more information  National Suicide Prevention Lifeline: www.suicidepreventionlifeline.org  Hopeline: www.hopeline.[place] for Suicide Prevention: PromotionalLoans.co.za  The [employer] (for lesbian, gay, bisexual, transgender, or questioning youth): www.thetrevorproject.org Contact a health care provider if:  You feel as though you are a burden to others.  You feel agitated, angry, vengeful, or have extreme mood swings.  You have withdrawn from family and friends. Get help right away if:  You are talking about suicide or wishing to die.  You start making plans for how to commit suicide.  You feel that you have no reason to live.  You start making plans for putting your affairs in order, saying goodbye, or giving your possessions away.  You feel guilt, shame, or unbearable pain, and it seems like there is no way out.  You are frequently using drugs or alcohol.  You are engaging in risky behaviors that could lead to death. If you have any of these symptoms, get help right away. Call emergency services, go to your nearest emergency department or crisis center, or call a suicide crisis helpline. Summary  Suicide is when you take your own life.  Promise yourself that you will not do anything extreme when you have suicidal feelings.  Let family, friends, teachers, or counselors know how you are feeling.  Get help right away if you feel as though life is getting too tough to handle and you are thinking about  suicide. This information is not intended to replace advice given to you by your health care provider. Make sure you discuss any questions you have with your health care provider. Document Released: 11/17/2002 Document Revised: 09/03/2018 Document Reviewed: 12/24/2016 Elsevier Patient Education  2020 [place] have been seen in the emergency department for a  psychiatric concern. You have been evaluated both medically as well as psychiatrically. Please follow-up with your outpatient resources provided. Return to the emergency department for any worsening symptoms, or any thoughts of hurting yourself or anyone else so that we may attempt to help you.

## 2018-12-06 NOTE — ED Notes (Signed)
Patient belongings placed in bag:   1 pink bra 1 gray shirt 1 red shirt 1 pair of pajama pants (black and white) [employer] slippers with jewels 1 purple robe

## 2018-12-06 NOTE — Consult Note (Signed)
[facility]  12/06/2018 4:35 PM Carrie Clark  MRN:  [id number] Principal Problem: Schizoaffective disorder, bipolar type [facility](HCC) Discharge Diagnoses: Principal Problem:   Schizoaffective disorder, bipolar type (HCC)  Subjective: "I'm fine.  I had a disturbance.  I got upset, just had to rest."  Patient got upset at her group home and threatened to harm herself.  Denies  Suicidal/homicidal ideations, hallucinations, or substance abuse.    Patient seen and evaluated in person.  Calm and cooperative on assessment, slurred speech either from her CVA or TD.  Reports she does not like her home and needs to find another one, agrees to return for now.  Past history of schizoaffective disorder.    HPI:  Per ED note:  58 y.o. female presents for evaluation she reports she does not want to go back to where she is living now.  She reports she does not want herself now, she tells me she does not like new staff member.  Denies find herself.  She is not any fevers chills no nausea vomiting.  She reports she got upset, made a threat that she is going to cut herself because she did not like staff member  No headache.  Reports has been taking her medication including that for urinary tract infection.  Denies wanting to harm herself or anyone else now but says she does not wish to return to her current care home  Total Time spent with patient: 1 hour  Past Psychiatric History: schizoaffective disorder, depression  Past Medical History:  Past Medical History:  Diagnosis Date  . COPD (chronic obstructive pulmonary disease) (HCC)   . CVA (cerebral infarction)   . Depression   . Hypertension   . Hypothyroidism   . Schizoaffective disorder, bipolar type (HCC)   . Schizophrenia (HCC)   . Stroke (HCC)   . Tardive dyskinesia   . Tardive dyskinesia     Past Surgical History:  Procedure Laterality Date  . NO PAST SURGERIES     Family History:  Family History  Family history unknown: Yes    Family Psychiatric  History: none Social History:  Social History   Substance and Sexual Activity  Alcohol Use No     Social History   Substance and Sexual Activity  Drug Use No    Social History   Socioeconomic History  . Marital status: Single    Spouse name: Not on file  . Number of children: Not on file  . Years of education: Not on file  . Highest education level: Not on file  Occupational History  . Not on file  Social Needs  . Financial resource strain: Not hard at all  . Food insecurity    Worry: Never true    Inability: Never true  . Transportation needs    Medical: No    Non-medical: No  Tobacco Use  . Smoking status: Current Every Day Smoker    Packs/day: 1.00    Types: Cigarettes  . Smokeless tobacco: Never Used  Substance and Sexual Activity  . Alcohol use: No  . Drug use: No  . Sexual activity: Not on file  Lifestyle  . Physical activity    Days per week: 0 days    Minutes per session: 0 min  . Stress: Not at all  Relationships  . Social connections    Talks on phone: Once a week    Gets together: Once a week    Attends religious service: Never    Active  member of club or organization: No    Attends meetings of clubs or organizations: Never    Relationship status: Never married  Other Topics Concern  . Not on file  Social History Narrative   [facility]    Has this patient used any form of tobacco in the last 30 days? (Cigarettes, Smokeless Tobacco, Cigars, and/or Pipes) NA  Current Medications: Current Facility-Administered Medications  Medication Dose Route Frequency Provider Last Rate Last Dose  . benztropine (COGENTIN) tablet 0.5 mg  0.5 mg Oral QHS Sharyn CreamerQuale, Mark, MD      . cephALEXin (KEFLEX) capsule 500 mg  500 mg Oral BID Sharyn CreamerQuale, Mark, MD   500 mg at 12/06/18 1402  . donepezil (ARICEPT) tablet 5 mg  5 mg Oral QHS Sharyn CreamerQuale, Mark, MD      . fluPHENAZine (PROLIXIN) tablet 10 mg  10 mg Oral TID Sharyn CreamerQuale, Mark, MD    10 mg at 12/06/18 1611  . furosemide (LASIX) tablet 20 mg  20 mg Oral Daily Sharyn CreamerQuale, Mark, MD   20 mg at 12/06/18 1401  . [START ON 12/07/2018] levothyroxine (SYNTHROID) tablet 100 mcg  100 mcg Oral QAC breakfast Sharyn CreamerQuale, Mark, MD      . metFORMIN (GLUCOPHAGE) tablet 1,000 mg  1,000 mg Oral BID Sharyn CreamerQuale, Mark, MD   1,000 mg at 12/06/18 1401   Current Outpatient Medications  Medication Sig Dispense Refill  . atorvastatin (LIPITOR) 40 MG tablet Take 40 mg by mouth daily.     . benztropine (COGENTIN) 0.5 MG tablet Take 0.5 mg by mouth at bedtime.    . cephALEXin (KEFLEX) 500 MG capsule Take 1 capsule (500 mg total) by mouth 2 (two) times daily for 6 days. 40 capsule 0  . cetirizine (ZYRTEC) 10 MG tablet Take 1 tablet (10 mg total) by mouth daily.    Marland Kitchen. donepezil (ARICEPT) 5 MG tablet Take 5 mg by mouth at bedtime.    . famotidine (PEPCID) 20 MG tablet Take 1 tablet (20 mg total) by mouth 2 (two) times daily.    . fluPHENAZine (PROLIXIN) 10 MG tablet Take 1 tablet (10 mg total) by mouth 3 (three) times daily. 90 tablet 0  . fluticasone (FLONASE) 50 MCG/ACT nasal spray Place 50 sprays into both nostrils 2 (two) times daily as needed. (Patient taking differently: Place 2 sprays into both nostrils every morning. )    . furosemide (LASIX) 20 MG tablet Take 1 tablet (20 mg total) by mouth daily. 30 tablet   . Insulin Detemir (LEVEMIR) 100 UNIT/ML Pen Inject 25 Units into the skin 2 (two) times daily. Before breakfast and dinner     . levothyroxine (SYNTHROID) 100 MCG tablet Take 100 mcg by mouth daily before breakfast.     . lisinopril (PRINIVIL,ZESTRIL) 5 MG tablet Take 5 mg by mouth daily.    Marland Kitchen. LORazepam (ATIVAN) 1 MG tablet Take 1 tablet (1 mg total) by mouth 2 (two) times daily as needed for anxiety (agitation).    Marland Kitchen. lurasidone (LATUDA) 20 MG TABS tablet Take 20 mg by mouth daily. Take 20 mg PO at 1700 with a meal    . metFORMIN (GLUCOPHAGE) 1000 MG tablet Take 1 tablet (1,000 mg total) by mouth 2 (two) times  daily.    . mirtazapine (REMERON) 15 MG tablet Take 15 mg by mouth at bedtime.    . Multiple Vitamins-Minerals (MULTIVITAMIN WITH MINERALS) tablet Take 1 tablet by mouth daily.    . Oxcarbazepine (TRILEPTAL) 300 MG tablet Take  1 tablet (300 mg total) by mouth 2 (two) times daily. 60 tablet 0  . potassium chloride (K-DUR,KLOR-CON) 10 MEQ tablet Take 1 tablet (10 mEq total) by mouth daily.    . prazosin (MINIPRESS) 1 MG capsule Take 1 mg by mouth at bedtime.    Marland Kitchen tiotropium (SPIRIVA) 18 MCG inhalation capsule Place 18 mcg into inhaler and inhale daily.    Marland Kitchen zolpidem (AMBIEN) 5 MG tablet Take 5 mg by mouth at bedtime.    Marland Kitchen acetaminophen (TYLENOL) 325 MG tablet Take 325 mg by mouth every 6 (six) hours as needed for moderate pain.    Marland Kitchen albuterol (PROVENTIL HFA;VENTOLIN HFA) 108 (90 Base) MCG/ACT inhaler Inhale 2 puffs into the lungs every 6 (six) hours as needed for wheezing or shortness of breath. 1 Inhaler 0  . glucose blood test strip 1 each by Other route every morning. Use as instructed for blood sugar monitoring each morning before meals    . guaiFENesin (ROBITUSSIN) 100 MG/5ML SOLN Take 10 mLs by mouth every 4 (four) hours as needed for cough or to loosen phlegm.     PTA Medications: (Not in a hospital admission)   Musculoskeletal: Strength & Muscle Tone: within normal limits Gait & Station: normal Patient leans: N/A  Psychiatric Specialty Exam: Physical Exam  Nursing note and vitals reviewed. Constitutional: She is oriented to person, place, and time. She appears well-developed and well-nourished.  HENT:  Head: Normocephalic.  Neck: Normal range of motion.  Respiratory: Effort normal.  Musculoskeletal: Normal range of motion.  Neurological: She is alert and oriented to person, place, and time.  Psychiatric: Her behavior is normal. Thought content normal. Her mood appears anxious. Her affect is blunt. Her speech is slurred. Cognition and memory are normal. She expresses impulsivity.     Review of Systems  Psychiatric/Behavioral: The patient is nervous/anxious.   All other systems reviewed and are negative.   Blood pressure 118/69, pulse 88, temperature 97.8 F (36.6 C), temperature source Oral, resp. rate 16, height 5\' 6"  (1.676 m), weight 81.6 kg, SpO2 96 %.Body mass index is 29.05 kg/m.  General Appearance: Casual  Eye Contact:  Good  Speech:  Slurred  Volume:  Normal  Mood:  Anxious  Affect:  Blunt  Thought Process:  Coherent and Descriptions of Associations: Intact  Orientation:  Full (Time, Place, and Person)  Thought Content:  WDL and Logical  Suicidal Thoughts:  No  Homicidal Thoughts:  No  Memory:  Immediate;   Fair Recent;   Fair Remote;   Fair  Judgement:  Fair  Insight:  Fair  Psychomotor Activity:  Normal  Concentration:  Concentration: Fair and Attention Span: Fair  Recall:  [employer] of Knowledge:  Fair  Language:  Good  Akathisia:  No  Handed:  Right  AIMS (if indicated):     Assets:  Housing Leisure Time Resilience Social Support  ADL's:  Intact  Cognition:  Impaired,  Mild  Sleep:        Demographic Factors:  Caucasian  Loss Factors: NA  Historical Factors: Impulsivity  Risk Reduction Factors:   Sense of responsibility to family, Living with another person, especially a relative, Positive social support and Positive therapeutic relationship  Continued Clinical Symptoms:  Anxiety, mild  Cognitive Features That Contribute To Risk:  None    Suicide Risk:  Minimal: No identifiable suicidal ideation.  Patients presenting with no risk factors but with morbid ruminations; may be classified as minimal risk based on the severity of  the depressive symptoms  Follow-up Information    Schedule an appointment as soon as possible for a visit  with Duffy RhodyPartridge, Tanillya, FNP.   Specialty: Family Medicine Contact information: [address] [place] [address] [phone]           Plan Of Care/Follow-up recommendations:   Schizoaffective disorder, bipolar type: -Continued Prolixin 10 mg TID -Continued Latuda 20 mg at 1700  EPS: -Continued Cogentin 0.5 mg at bedtime  Dementia: -Continued Aricept 5 mg daily Activity:  as tolerated Diet:  heart healthy diet  Disposition: discharge to group home Nanine MeansJamison , NP 12/06/2018, 4:35 PM

## 2018-12-06 NOTE — ED Provider Notes (Signed)
[facility] Emergency Department Provider Note   ____________________________________________   First MD Initiated Contact with Patient 12/06/18 1052     (approximate)  I have reviewed the triage vital signs and the nursing notes.   HISTORY  Chief Complaint Suicidal    HPI Carrie LovelessJoy Clark is a 58 y.o. female presents for evaluation she reports she does not want to go back to where she is living now  She reports she does not want herself now, she tells me she does not like new staff member.   Denies find herself.  She is not any fevers chills no nausea vomiting.  She reports she got upset, made a threat that she is going to cut herself because she did not like staff member  No headache.  Reports has been taking her medication including that for urinary tract infection.  Denies wanting to harm herself or anyone else now but says she does not wish to return to her current care home  Past Medical History:  Diagnosis Date  . COPD (chronic obstructive pulmonary disease) (HCC)   . CVA (cerebral infarction)   . Depression   . Hypertension   . Hypothyroidism   . Schizoaffective disorder, bipolar type (HCC)   . Schizophrenia (HCC)   . Stroke (HCC)   . Tardive dyskinesia   . Tardive dyskinesia     Patient Active Problem List   Diagnosis Date Noted  . UTI (urinary tract infection) 12/03/2018  . Hypothyroidism 12/03/2018  . HTN (hypertension) 12/03/2018  . COPD (chronic obstructive pulmonary disease) (HCC) 12/03/2018  . Diabetes (HCC) 12/03/2018  . Altered mental status 12/03/2018  . Schizoaffective disorder, bipolar type (HCC) 09/11/2015  . Aggression   . Episode of behavior change   . Psychoses [facility](HCC)     Past Surgical History:  Procedure Laterality Date  . NO PAST SURGERIES      Prior to Admission medications   Medication Sig Start Date End Date Taking? Authorizing Provider  atorvastatin (LIPITOR) 40 MG tablet Take 40 mg by mouth daily.     Yes [provider]  benztropine (COGENTIN) 0.5 MG tablet Take 0.5 mg by mouth at bedtime.   Yes [provider]  cephALEXin (KEFLEX) 500 MG capsule Take 1 capsule (500 mg total) by mouth 2 (two) times daily for 6 days. 12/03/18 12/09/18 Yes Mody, Patricia PesaSital, MD  cetirizine (ZYRTEC) 10 MG tablet Take 1 tablet (10 mg total) by mouth daily. 11/11/14  Yes Withrow, Everardo AllJohn C, FNP  donepezil (ARICEPT) 5 MG tablet Take 5 mg by mouth at bedtime.   Yes [provider]  famotidine (PEPCID) 20 MG tablet Take 1 tablet (20 mg total) by mouth 2 (two) times daily. 11/11/14  Yes Withrow, Everardo AllJohn C, FNP  fluPHENAZine (PROLIXIN) 10 MG tablet Take 1 tablet (10 mg total) by mouth 3 (three) times daily. 12/03/18  Yes Mody, Patricia PesaSital, MD  fluticasone (FLONASE) 50 MCG/ACT nasal spray Place 50 sprays into both nostrils 2 (two) times daily as needed. Patient taking differently: Place 2 sprays into both nostrils every morning.  11/11/14  Yes Withrow, Everardo AllJohn C, FNP  furosemide (LASIX) 20 MG tablet Take 1 tablet (20 mg total) by mouth daily. 11/11/14  Yes Withrow, Everardo AllJohn C, FNP  Insulin Detemir (LEVEMIR) 100 UNIT/ML Pen Inject 25 Units into the skin 2 (two) times daily. Before breakfast and dinner    Yes [provider]  levothyroxine (SYNTHROID) 100 MCG tablet Take 100 mcg by mouth daily before breakfast.  Yes [provider]  lisinopril (PRINIVIL,ZESTRIL) 5 MG tablet Take 5 mg by mouth daily.   Yes [provider]  LORazepam (ATIVAN) 1 MG tablet Take 1 tablet (1 mg total) by mouth 2 (two) times daily as needed for anxiety (agitation). 12/03/18  Yes Mody, Sital, MD  lurasidone (LATUDA) 20 MG TABS tablet Take 20 mg by mouth daily. Take 20 mg PO at 1700 with a meal   Yes [provider]  metFORMIN (GLUCOPHAGE) 1000 MG tablet Take 1 tablet (1,000 mg total) by mouth 2 (two) times daily. 09/11/15  Yes Patrecia Pour, NP  mirtazapine (REMERON) 15 MG tablet Take 15 mg by mouth at bedtime.   Yes  [provider]  Multiple Vitamins-Minerals (MULTIVITAMIN WITH MINERALS) tablet Take 1 tablet by mouth daily. 11/11/14  Yes Withrow, Elyse Jarvis, FNP  Oxcarbazepine (TRILEPTAL) 300 MG tablet Take 1 tablet (300 mg total) by mouth 2 (two) times daily. 09/11/15  Yes Lord, Asa Saunas, NP  potassium chloride (K-DUR,KLOR-CON) 10 MEQ tablet Take 1 tablet (10 mEq total) by mouth daily. 11/11/14  Yes Withrow, Elyse Jarvis, FNP  prazosin (MINIPRESS) 1 MG capsule Take 1 mg by mouth at bedtime.   Yes [provider]  tiotropium (SPIRIVA) 18 MCG inhalation capsule Place 18 mcg into inhaler and inhale daily.   Yes [provider]  zolpidem (AMBIEN) 5 MG tablet Take 5 mg by mouth at bedtime.   Yes [provider]  acetaminophen (TYLENOL) 325 MG tablet Take 325 mg by mouth every 6 (six) hours as needed for moderate pain.    [provider]  albuterol (PROVENTIL HFA;VENTOLIN HFA) 108 (90 Base) MCG/ACT inhaler Inhale 2 puffs into the lungs every 6 (six) hours as needed for wheezing or shortness of breath. 06/07/15   Mesner, Corene Cornea, MD  glucose blood test strip 1 each by Other route every morning. Use as instructed for blood sugar monitoring each morning before meals    [provider]  guaiFENesin (ROBITUSSIN) 100 MG/5ML SOLN Take 10 mLs by mouth every 4 (four) hours as needed for cough or to loosen phlegm.    [provider]    Allergies Fish allergy  Family History  Family history unknown: Yes    Social History Social History   Tobacco Use  . Smoking status: Current Every Day Smoker    Packs/day: 1.00    Types: Cigarettes  . Smokeless tobacco: Never Used  Substance Use Topics  . Alcohol use: No  . Drug use: No    Review of Systems Constitutional: No fever/chills Eyes: No visual changes. ENT: No sore throat. Cardiovascular: Denies chest pain.  Denies exposure to coronavirus. Respiratory: Denies shortness of breath. Gastrointestinal: No abdominal  pain.   Genitourinary: Negative for dysuria. Musculoskeletal: Negative for back pain. Skin: Negative for rash. Neurological: Negative for headaches, areas of focal weakness or numbness.    ____________________________________________   PHYSICAL EXAM:  VITAL SIGNS: ED Triage Vitals  Enc Vitals Group     BP 12/06/18 1050 118/69     Pulse Rate 12/06/18 1050 88     Resp 12/06/18 1050 16     Temp 12/06/18 1050 97.8 F (36.6 C)     Temp Source 12/06/18 1050 Oral     SpO2 12/06/18 1050 96 %     Weight 12/06/18 1049 180 lb (81.6 kg)     Height 12/06/18 1049 5\' 6"  (1.676 m)     Head Circumference --      Peak  Flow --      Pain Score 12/06/18 1055 0     Pain Loc --      Pain Edu? --      Excl. in GC? --     Constitutional: Alert and oriented. Well appearing and in no acute distress.  Sitting up at side of bed, pleasant in no distress. Eyes: Conjunctivae are normal. Head: Atraumatic. Nose: No congestion/rhinnorhea. Mouth/Throat: Mucous membranes are moist. Neck: No stridor.  Cardiovascular:   Good peripheral circulation. Respiratory: Normal respiratory effort.  No retractions. Lungs CTAB. Gastrointestinal: Soft and nontender. No distention. Musculoskeletal: No lower extremity edema. Neurologic:  Normal speech and language. No gross focal neurologic deficits are appreciated.  Skin:  Skin is warm, dry and intact. No rash noted. Psychiatric: Mood and affect are somewhat flat, but calm and showing no agitation. Speech and behavior are normal.  ____________________________________________   LABS (all labs ordered are listed, but only abnormal results are displayed)  Labs Reviewed  CBC - Abnormal; Notable for the following components:      Result Value   WBC 12.9 (*)    RBC 3.77 (*)    Hemoglobin 11.4 (*)    HCT 35.3 (*)    All other components within normal limits  COMPREHENSIVE METABOLIC PANEL - Abnormal; Notable for the following components:   Glucose, Bld 162 (*)     All other components within normal limits  ACETAMINOPHEN LEVEL - Abnormal; Notable for the following components:   Acetaminophen (Tylenol), Serum <10 (*)    All other components within normal limits  SALICYLATE LEVEL  AMMONIA  TSH  URINALYSIS, COMPLETE (UACMP) WITH MICROSCOPIC  URINE DRUG SCREEN, QUALITATIVE ([facility] ONLY)   ____________________________________________  EKG   ____________________________________________  RADIOLOGY   ____________________________________________   PROCEDURES  Procedure(s) performed: None  Procedures  Critical Care performed: No  ____________________________________________   INITIAL IMPRESSION / ASSESSMENT AND PLAN / ED COURSE  Pertinent labs & imaging results that were available during my care of the patient were reviewed by me and considered in my medical decision making (see chart for details).   Patient recently discharged for urinary tract infection, psychosis.  Returns today reporting she does not like her current group home because of a new worker there.  She is alert, slightly flattened affect at times, and shows no signs of acute distress or decompensation.  She reports compliance with her medication.  She is no acute distress and denies any acute medical complaint today.  She is alert and oriented.  Will order screening labs that she denies any attempt to harm herself and I suspect some of her report of threats was situational likely related to not liking the new worker at her group home.  Placed a consultation to psychiatry, will keep the patient voluntary at this time as she denies any ongoing desire to harm herself or others, seems to be situational related to her group home.  Urinalysis pending, signed over to Dr. Lenard LancePaduchowski    ----------------------------------------- 3:29 PM on 12/06/2018 -----------------------------------------  Patient is placed under IVC now, relayed this to NP Lord.  Patient began making statements  that she wanted to leave, and she has not yet received her psychiatric evaluation for the patient safety given her recent admission, psychiatric evaluation needed, and statements of self harming ideations I have placed the patient under IVC to assure that she is seen and evaluated by psychiatry as I do not believe that she necessarily has appropriate insight to be safely discharged at  this time given her complaints and past history.  ____________________________________________   FINAL CLINICAL IMPRESSION(S) / ED DIAGNOSES  Final diagnoses:  Suicidal ideations        Note:  This document was prepared using Dragon voice recognition software and may include unintentional dictation errors       Sharyn CreamerQuale, , MD 12/06/18 1530

## 2019-01-03 ENCOUNTER — Other Ambulatory Visit: Payer: Self-pay

## 2019-01-03 ENCOUNTER — Encounter (HOSPITAL_COMMUNITY): Payer: Self-pay | Admitting: Emergency Medicine

## 2019-01-03 ENCOUNTER — Emergency Department (HOSPITAL_COMMUNITY): Payer: Medicare Other

## 2019-01-03 ENCOUNTER — Emergency Department (HOSPITAL_COMMUNITY)
Admission: EM | Admit: 2019-01-03 | Discharge: 2019-01-03 | Disposition: A | Discharge status: Home / Self Care | Payer: Medicare Other | Attending: Emergency Medicine | Admitting: Emergency Medicine

## 2019-01-03 DIAGNOSIS — E119 Type 2 diabetes mellitus without complications: Secondary | ICD-10-CM | POA: Insufficient documentation

## 2019-01-03 DIAGNOSIS — E162 Hypoglycemia, unspecified: Secondary | ICD-10-CM | POA: Diagnosis present

## 2019-01-03 DIAGNOSIS — N3000 Acute cystitis without hematuria: Secondary | ICD-10-CM | POA: Diagnosis not present

## 2019-01-03 DIAGNOSIS — Z8673 Personal history of transient ischemic attack (TIA), and cerebral infarction without residual deficits: Secondary | ICD-10-CM | POA: Diagnosis not present

## 2019-01-03 DIAGNOSIS — E039 Hypothyroidism, unspecified: Secondary | ICD-10-CM | POA: Insufficient documentation

## 2019-01-03 DIAGNOSIS — I10 Essential (primary) hypertension: Secondary | ICD-10-CM | POA: Insufficient documentation

## 2019-01-03 DIAGNOSIS — Z79899 Other long term (current) drug therapy: Secondary | ICD-10-CM | POA: Insufficient documentation

## 2019-01-03 DIAGNOSIS — J449 Chronic obstructive pulmonary disease, unspecified: Secondary | ICD-10-CM | POA: Diagnosis not present

## 2019-01-03 DIAGNOSIS — Z794 Long term (current) use of insulin: Secondary | ICD-10-CM | POA: Diagnosis not present

## 2019-01-03 DIAGNOSIS — F17210 Nicotine dependence, cigarettes, uncomplicated: Secondary | ICD-10-CM | POA: Diagnosis not present

## 2019-01-03 LAB — CBC WITH DIFFERENTIAL/PLATELET
Abs Immature Granulocytes: 0.06 10*3/uL (ref 0.00–0.07)
Basophils Absolute: 0 10*3/uL (ref 0.0–0.1)
Basophils Relative: 0 %
Eosinophils Absolute: 0.1 10*3/uL (ref 0.0–0.5)
Eosinophils Relative: 1 %
HCT: 30.4 % — ABNORMAL LOW (ref 36.0–46.0)
Hemoglobin: 9.7 g/dL — ABNORMAL LOW (ref 12.0–15.0)
Immature Granulocytes: 1 %
Lymphocytes Relative: 14 %
Lymphs Abs: 1.6 10*3/uL (ref 0.7–4.0)
MCH: 29.7 pg (ref 26.0–34.0)
MCHC: 31.9 g/dL (ref 30.0–36.0)
MCV: 93 fL (ref 80.0–100.0)
Monocytes Absolute: 0.7 10*3/uL (ref 0.1–1.0)
Monocytes Relative: 7 %
Neutro Abs: 8.4 10*3/uL — ABNORMAL HIGH (ref 1.7–7.7)
Neutrophils Relative %: 77 %
Platelets: 239 10*3/uL (ref 150–400)
RBC: 3.27 MIL/uL — ABNORMAL LOW (ref 3.87–5.11)
RDW: 13.8 % (ref 11.5–15.5)
WBC: 10.8 10*3/uL — ABNORMAL HIGH (ref 4.0–10.5)
nRBC: 0 % (ref 0.0–0.2)

## 2019-01-03 LAB — COMPREHENSIVE METABOLIC PANEL
ALT: 30 U/L (ref 0–44)
AST: 31 U/L (ref 15–41)
Albumin: 2.9 g/dL — ABNORMAL LOW (ref 3.5–5.0)
Alkaline Phosphatase: 57 U/L (ref 38–126)
Anion gap: 10 (ref 5–15)
BUN: 21 mg/dL — ABNORMAL HIGH (ref 6–20)
CO2: 24 mmol/L (ref 22–32)
Calcium: 8.9 mg/dL (ref 8.9–10.3)
Chloride: 101 mmol/L (ref 98–111)
Creatinine, Ser: 0.82 mg/dL (ref 0.44–1.00)
GFR calc Af Amer: >60 mL/min (ref >60)
GFR calc non Af Amer: >60 mL/min (ref >60)
Glucose, Bld: 124 mg/dL — ABNORMAL HIGH (ref 70–99)
Potassium: 4.1 mmol/L (ref 3.5–5.1)
Sodium: 135 mmol/L (ref 135–145)
Total Bilirubin: 0.4 mg/dL (ref 0.3–1.2)
Total Protein: 6.1 g/dL — ABNORMAL LOW (ref 6.5–8.1)

## 2019-01-03 LAB — CBG MONITORING, ED: Glucose-Capillary: 146 mg/dL — ABNORMAL HIGH (ref 70–99)

## 2019-01-03 LAB — URINALYSIS, ROUTINE W REFLEX MICROSCOPIC
Bilirubin Urine: NEGATIVE
Glucose, UA: NEGATIVE mg/dL
Hgb urine dipstick: NEGATIVE
Ketones, ur: NEGATIVE mg/dL
Nitrite: NEGATIVE
Protein, ur: NEGATIVE mg/dL
Specific Gravity, Urine: 1.011 (ref 1.005–1.030)
pH: 5 (ref 5.0–8.0)

## 2019-01-03 MED ORDER — CEPHALEXIN 500 MG PO CAPS: 500.0000 mg | ORAL_CAPSULE | Freq: Four times a day (QID) | ORAL | 0 refills | Status: DC

## 2019-01-03 MED ORDER — CEPHALEXIN 500 MG PO CAPS
500.0000 mg | ORAL_CAPSULE | Freq: Once | ORAL | Status: AC
  Administered 2019-01-03: 500 mg via ORAL
  Filled 2019-01-03: qty 1

## 2019-01-03 NOTE — Discharge Instructions (Addendum)
Make sure she takes the antibiotic as directed until its finished.  She will need to follow-up with her primary care provider this week for recheck as her diabetes medications may need to be adjusted.  Return here for any worsening symptoms

## 2019-01-03 NOTE — ED Notes (Signed)
IV d/c'd to left hand, catheter intact and site wnl.  bandaid applied to site

## 2019-01-03 NOTE — ED Triage Notes (Signed)
Pt brought in from [facility] home for evaluation of hypoglycemia. Pt is on Levemir 25units bid as well as Metformin 1000mg  bid. Pt reports the facility does not feed her very well. Pt was covered in feces on arrival.

## 2019-01-03 NOTE — ED Provider Notes (Signed)
[facility] EMERGENCY DEPARTMENT Provider Note   CSN: [id number] Arrival date & time: 01/03/19  [id number]     History   Chief Complaint Chief Complaint  Patient presents with  . Hypoglycemia    HPI Carrie Clark is a 58 y.o. female.     The history is provided by a caregiver and the nursing home.  Hypoglycemia    Carrie Clark is a 58 y.o. female with hx of schizoaffective disorder, schizophrenia, COPD, HTN and diabetes, who presents to the Emergency Department via EMS.  She is a resident at [facility].  Per the patient's caregiver, Burnett HarryShelly, her blood sugar has been running low intermittently for 6-7 days.  The caregiver states that her blood sugars have been averaging between 40-70 this week and she states the patient has continued to eat her meals and snacks.  She has also noticed an increase in urination this week.  Caregiver checked her sugar this morning, initially it was 120 then dropped to 40 so her metformin and Levemir was held.   Caregiver states that she is normally loud and disruptive at her baseline.  She denies noticing any behavioral or mental changes this week.  patient denies pain, fever, vomiting, diarrhea or cough.  No recent medication changes    Past Medical History:  Diagnosis Date  . COPD (chronic obstructive pulmonary disease) (HCC)   . CVA (cerebral infarction)   . Depression   . Hypertension   . Hypothyroidism   . Schizoaffective disorder, bipolar type (HCC)   . Schizophrenia (HCC)   . Stroke (HCC)   . Tardive dyskinesia   . Tardive dyskinesia     Patient Active Problem List   Diagnosis Date Noted  . UTI (urinary tract infection) 12/03/2018  . Hypothyroidism 12/03/2018  . HTN (hypertension) 12/03/2018  . COPD (chronic obstructive pulmonary disease) (HCC) 12/03/2018  . Diabetes (HCC) 12/03/2018  . Altered mental status 12/03/2018  . Schizoaffective disorder, bipolar type (HCC) 09/11/2015  . Aggression   . Episode of behavior change    . Psychoses [facility](HCC)     Past Surgical History:  Procedure Laterality Date  . NO PAST SURGERIES       OB History   No obstetric history on file.      Home Medications    Prior to Admission medications   Medication Sig Start Date End Date Taking? Authorizing Provider  acetaminophen (TYLENOL) 325 MG tablet Take 325 mg by mouth every 6 (six) hours as needed for moderate pain.    [provider]  albuterol (PROVENTIL HFA;VENTOLIN HFA) 108 (90 Base) MCG/ACT inhaler Inhale 2 puffs into the lungs every 6 (six) hours as needed for wheezing or shortness of breath. 06/07/15   Mesner, Barbara CowerJason, MD  atorvastatin (LIPITOR) 40 MG tablet Take 40 mg by mouth daily.     [provider]  benztropine (COGENTIN) 0.5 MG tablet Take 0.5 mg by mouth at bedtime.    [provider]  cetirizine (ZYRTEC) 10 MG tablet Take 1 tablet (10 mg total) by mouth daily. 11/11/14   Withrow, Everardo AllJohn C, FNP  donepezil (ARICEPT) 5 MG tablet Take 5 mg by mouth at bedtime.    [provider]  famotidine (PEPCID) 20 MG tablet Take 1 tablet (20 mg total) by mouth 2 (two) times daily. 11/11/14   Withrow, Everardo AllJohn C, FNP  fluPHENAZine (PROLIXIN) 10 MG tablet Take 1 tablet (10 mg total) by mouth 3 (three) times daily. 12/03/18   Adrian SaranMody, Sital, MD  furosemide (LASIX)  20 MG tablet Take 1 tablet (20 mg total) by mouth daily. 11/11/14   Withrow, Everardo AllJohn C, FNP  glucose blood test strip 1 each by Other route every morning. Use as instructed for blood sugar monitoring each morning before meals    [provider]  guaiFENesin (ROBITUSSIN) 100 MG/5ML SOLN Take 10 mLs by mouth every 4 (four) hours as needed for cough or to loosen phlegm.    [provider]  Insulin Detemir (LEVEMIR) 100 UNIT/ML Pen Inject 25 Units into the skin 2 (two) times daily. Before breakfast and dinner     [provider]  levothyroxine (SYNTHROID) 100 MCG tablet Take 100 mcg by mouth daily before breakfast.     [provider]  lisinopril (PRINIVIL,ZESTRIL) 5 MG tablet Take 5 mg by mouth daily.    [provider]  LORazepam (ATIVAN) 1 MG tablet Take 1 tablet (1 mg total) by mouth 2 (two) times daily as needed for anxiety (agitation). 12/03/18   Adrian SaranMody, Sital, MD  lurasidone (LATUDA) 20 MG TABS tablet Take 20 mg by mouth daily. Take 20 mg PO at 1700 with a meal    [provider]  metFORMIN (GLUCOPHAGE) 1000 MG tablet Take 1 tablet (1,000 mg total) by mouth 2 (two) times daily. 09/11/15   Charm RingsLord, Jamison Y, NP  mirtazapine (REMERON) 15 MG tablet Take 15 mg by mouth at bedtime.    [provider]  Multiple Vitamins-Minerals (MULTIVITAMIN WITH MINERALS) tablet Take 1 tablet by mouth daily. 11/11/14   Withrow, Everardo AllJohn C, FNP  Oxcarbazepine (TRILEPTAL) 300 MG tablet Take 1 tablet (300 mg total) by mouth 2 (two) times daily. 09/11/15   Charm RingsLord, Jamison Y, NP  potassium chloride (K-DUR,KLOR-CON) 10 MEQ tablet Take 1 tablet (10 mEq total) by mouth daily. 11/11/14   Withrow, Everardo AllJohn C, FNP  prazosin (MINIPRESS) 1 MG capsule Take 1 mg by mouth at bedtime.    [provider]  tiotropium (SPIRIVA) 18 MCG inhalation capsule Place 18 mcg into inhaler and inhale daily.    [provider]  zolpidem (AMBIEN) 5 MG tablet Take 5 mg by mouth at bedtime.    [provider]    Family History Family History  Family history unknown: Yes    Social History Social History   Tobacco Use  . Smoking status: Current Every Day Smoker    Packs/day: 1.00    Types: Cigarettes  . Smokeless tobacco: Never Used  Substance Use Topics  . Alcohol use: No  . Drug use: No     Allergies   Fish allergy   Review of Systems Review of Systems  Unable to perform ROS: Psychiatric disorder     Physical Exam Updated Vital Signs Pulse 71   Temp 97.8 F (36.6 C) (Oral)   Resp 16   Ht 5\' 4"  (1.626 m)   Wt 82 kg   SpO2 97%   BMI 31.03 kg/m   Physical Exam Vitals signs and nursing note  reviewed.  Constitutional:      Appearance: She is not ill-appearing or toxic-appearing.  HENT:     Head: Atraumatic.     Mouth/Throat:     Mouth: Mucous membranes are moist.  Eyes:     Extraocular Movements: Extraocular movements intact.     Pupils: Pupils are equal, round, and reactive to light.  Neck:     Musculoskeletal: Normal range of motion. No muscular tenderness.  Cardiovascular:     Rate and Rhythm: Normal rate and regular  rhythm.     Pulses: Normal pulses.  Pulmonary:     Effort: Pulmonary effort is normal.     Breath sounds: Normal breath sounds.  Abdominal:     General: There is no distension.     Palpations: Abdomen is soft.     Tenderness: There is no abdominal tenderness. There is no right CVA tenderness, left CVA tenderness or guarding.  Musculoskeletal: Normal range of motion.     Right lower leg: No edema.     Left lower leg: No edema.  Skin:    General: Skin is warm.     Findings: No rash.  Neurological:     Mental Status: She is alert.     Sensory: Sensation is intact.     Motor: Motor function is intact. No weakness.     Coordination: Coordination is intact.  Psychiatric:        Mood and Affect: Affect is angry.      ED Treatments / Results  Labs (all labs ordered are listed, but only abnormal results are displayed) Labs Reviewed  COMPREHENSIVE METABOLIC PANEL - Abnormal; Notable for the following components:      Result Value   Glucose, Bld 124 (*)    BUN 21 (*)    Total Protein 6.1 (*)    Albumin 2.9 (*)    All other components within normal limits  CBC WITH DIFFERENTIAL/PLATELET - Abnormal; Notable for the following components:   WBC 10.8 (*)    RBC 3.27 (*)    Hemoglobin 9.7 (*)    HCT 30.4 (*)    Neutro Abs 8.4 (*)    All other components within normal limits  URINALYSIS, ROUTINE W REFLEX MICROSCOPIC - Abnormal; Notable for the following components:   APPearance HAZY (*)    Leukocytes,Ua SMALL (*)    Bacteria, UA FEW (*)    All  other components within normal limits  CBG MONITORING, ED - Abnormal; Notable for the following components:   Glucose-Capillary 146 (*)    All other components within normal limits  URINE CULTURE    EKG None  Radiology Dg Chest Portable 1 View  Result Date: 01/03/2019 CLINICAL DATA:  Weakness EXAM: PORTABLE CHEST 1 VIEW COMPARISON:  12/02/2018 chest radiograph. FINDINGS: Stable cardiomediastinal silhouette with top-normal heart size. No pneumothorax. No pleural effusion. Lungs appear clear, with no acute consolidative airspace disease and no pulmonary edema. IMPRESSION: No active disease. Electronically Signed   By: Ilona Sorrel M.D.   On: 01/03/2019 10:16    Procedures Procedures (including critical care time)  Medications Ordered in ED Medications  cephALEXin (KEFLEX) capsule 500 mg (500 mg Oral Given 01/03/19 1142)     Initial Impression / Assessment and Plan / ED Course  I have reviewed the triage vital signs and the nursing notes.  Pertinent labs & imaging results that were available during my care of the patient were reviewed by me and considered in my medical decision making (see chart for details).        Pt upset on arrival, shouting at staff, she was given a meal tray and she is now calm and more cooperative.  Blood sugar here is 124 prior to her food intake.  She is non-toxic appearing and answers questions appropriately.  Work up today shows likely cystitis.  Urine culture pending.  No clinical concern for pyelonephritis, kidney stone or sepsis.  I will start patient on Keflex and she will need close out pt f/u with her PCP.  Spoke with facility caregiver again, Burnett HarryShelly, and advised that pt will need PCP f/u for this week as her medications may need adjusting and rx written for Keflex.  She verbalized understanding and agrees to plan.  States the facility will arrange pt transport back.  Also spoke with pt's legal guardian, Carrie Clark, who is pt's sister and discussed work  and tx plan.    Final Clinical Impressions(s) / ED Diagnoses   Final diagnoses:  Acute cystitis without hematuria    ED Discharge Orders    None       Pauline Ausriplett, Kachina Niederer, PA-C 01/03/19 1231    Samuel JesterMcManus, Kathleen, DO 01/04/19 [id number]

## 2019-01-05 LAB — URINE CULTURE: Culture: >=100000 — AB

## 2019-01-06 ENCOUNTER — Telehealth: Payer: Self-pay | Admitting: Emergency Medicine

## 2019-01-06 NOTE — Telephone Encounter (Signed)
Post ED Visit - Positive Culture Follow-up  Culture report reviewed by antimicrobial stewardship pharmacist: [place] Team []  Elenor Quinones, Pharm.D. []  Heide Guile, Pharm.D., BCPS AQ-ID []  Parks Neptune, Pharm.D., BCPS []  Alycia Rossetti, Pharm.D., BCPS []  [place], Pharm.D., BCPS, AAHIVP []  Legrand Como, Pharm.D., BCPS, AAHIVP []  Salome Arnt, PharmD, BCPS []  Johnnette Gourd, PharmD, BCPS []  Hughes Better, PharmD, BCPS []  Leeroy Cha, PharmD []  Laqueta Linden, PharmD, BCPS []  Albertina Parr, PharmD Eddie Candle PharmD  [place] Team []  Leodis Sias, PharmD []  Lindell Spar, PharmD []  Royetta Asal, PharmD []  Graylin Shiver, Rph []  Rema Fendt) Glennon Mac, PharmD []  Arlyn Dunning, PharmD []  Netta Cedars, PharmD []  Dia Sitter, PharmD []  Leone Haven, PharmD []  Gretta Arab, PharmD []  Theodis Shove, PharmD []  Peggyann Juba, PharmD []  Reuel Boom, PharmD   Positive urine culture Treated with cephalexin, organism sensitive to the same and no further patient follow-up is required at this time.  Hazle Nordmann 01/06/2019, 11:10 AM

## 2019-01-29 ENCOUNTER — Emergency Department (HOSPITAL_COMMUNITY)
Admission: EM | Admit: 2019-01-29 | Discharge: 2019-01-30 | Disposition: A | Discharge status: Home / Self Care | Payer: Medicare Other | Attending: Emergency Medicine | Admitting: Emergency Medicine

## 2019-01-29 ENCOUNTER — Other Ambulatory Visit: Payer: Self-pay

## 2019-01-29 ENCOUNTER — Encounter (HOSPITAL_COMMUNITY): Payer: Self-pay

## 2019-01-29 DIAGNOSIS — I10 Essential (primary) hypertension: Secondary | ICD-10-CM | POA: Insufficient documentation

## 2019-01-29 DIAGNOSIS — F17210 Nicotine dependence, cigarettes, uncomplicated: Secondary | ICD-10-CM | POA: Insufficient documentation

## 2019-01-29 DIAGNOSIS — W19XXXA Unspecified fall, initial encounter: Secondary | ICD-10-CM

## 2019-01-29 DIAGNOSIS — Y999 Unspecified external cause status: Secondary | ICD-10-CM | POA: Insufficient documentation

## 2019-01-29 DIAGNOSIS — M545 Low back pain: Secondary | ICD-10-CM | POA: Insufficient documentation

## 2019-01-29 DIAGNOSIS — Y939 Activity, unspecified: Secondary | ICD-10-CM | POA: Diagnosis not present

## 2019-01-29 DIAGNOSIS — E039 Hypothyroidism, unspecified: Secondary | ICD-10-CM | POA: Diagnosis not present

## 2019-01-29 DIAGNOSIS — W010XXA Fall on same level from slipping, tripping and stumbling without subsequent striking against object, initial encounter: Secondary | ICD-10-CM | POA: Insufficient documentation

## 2019-01-29 DIAGNOSIS — J449 Chronic obstructive pulmonary disease, unspecified: Secondary | ICD-10-CM | POA: Insufficient documentation

## 2019-01-29 DIAGNOSIS — Y929 Unspecified place or not applicable: Secondary | ICD-10-CM | POA: Diagnosis not present

## 2019-01-29 DIAGNOSIS — E119 Type 2 diabetes mellitus without complications: Secondary | ICD-10-CM | POA: Insufficient documentation

## 2019-01-29 MED ORDER — ACETAMINOPHEN 325 MG PO TABS: 650.0000 mg | ORAL_TABLET | Freq: Four times a day (QID) | ORAL | 0 refills | Status: DC | PRN

## 2019-01-29 NOTE — ED Triage Notes (Signed)
Pt in by [facility] after a fall.  Pt states she "just went down on my butt", states she doesn't know why she fell.  Pt c/o lower back pain, was ambulatory per ems without diff.

## 2019-01-29 NOTE — ED Provider Notes (Signed)
Emergency Department Provider Note   I have reviewed the triage vital signs and the nursing notes.   HISTORY  Chief Complaint Fall   HPI Carrie Clark is a 58 y.o. female who was pulling back a chair and got her feet tangled up and fell backwards on her butt.  She initially had some pain in that area but was able to ambulate without difficulty.  She is now pain-free.  She did not hit her head, pass out.  She not feel weak, dizzy or any other associated symptoms.  She not hurting elsewhere.  She states she just wants to go home as she feels fine and wants to go to sleep.  Has not take anything for medication.  Nursing note appears that patient was able to ambulate with EMS without any difficulty.   No other associated or modifying symptoms.    Past Medical History:  Diagnosis Date  . COPD (chronic obstructive pulmonary disease) (HCC)   . CVA (cerebral infarction)   . Depression   . Hypertension   . Hypothyroidism   . Schizoaffective disorder, bipolar type (HCC)   . Schizophrenia (HCC)   . Stroke (HCC)   . Tardive dyskinesia   . Tardive dyskinesia     Patient Active Problem List   Diagnosis Date Noted  . UTI (urinary tract infection) 12/03/2018  . Hypothyroidism 12/03/2018  . HTN (hypertension) 12/03/2018  . COPD (chronic obstructive pulmonary disease) (HCC) 12/03/2018  . Diabetes (HCC) 12/03/2018  . Altered mental status 12/03/2018  . Schizoaffective disorder, bipolar type (HCC) 09/11/2015  . Aggression   . Episode of behavior change   . Psychoses [facility](HCC)     Past Surgical History:  Procedure Laterality Date  . NO PAST SURGERIES      Current Outpatient Rx  . Order #: [id number] Class: Normal  . Order #: [id number] Class: Print  . Order #: [id number] Class: Historical Med  . Order #: [id number] Class: Historical Med  . Order #: [id number] Class: Print  . Order #: [id number] Class: No Print  . Order #: [id number] Class: Historical Med  . Order #: [id number] Class: No Print   . Order #: [id number] Class: Print  . Order #: [id number] Class: Historical Med  . Order #: [id number] Class: No Print  . Order #: [id number] Class: Historical Med  . Order #: [id number] Class: Historical Med  . Order #: [id number] Class: Historical Med  . Order #: [id number] Class: Historical Med  . Order #: [id number] Class: No Print  . Order #: [id number] Class: Historical Med  . Order #: [id number] Class: No Print  . Order #: [id number] Class: Historical Med  . Order #: [id number] Class: No Print  . Order #: [id number] Class: Normal  . Order #: [id number] Class: No Print  . Order #: [id number] Class: Historical Med  . Order #: [id number] Class: Historical Med  . Order #: [id number] Class: Historical Med    Allergies Fish allergy  Family History  Family history unknown: Yes    Social History Social History   Tobacco Use  . Smoking status: Current Every Day Smoker    Packs/day: 1.00    Types: Cigarettes  . Smokeless tobacco: Never Used  Substance Use Topics  . Alcohol use: No  . Drug use: No    Review of Systems  All other systems negative except as documented in the HPI. All pertinent positives and negatives as reviewed in the HPI. ____________________________________________   PHYSICAL EXAM:  VITAL SIGNS: ED Triage Vitals  Enc Vitals Group     BP 01/29/19 2141 133/61     Pulse Rate 01/29/19 2141 85  Resp 01/29/19 2141 (!) 21     Temp 01/29/19 2141 98.3 F (36.8 C)     Temp Source 01/29/19 2141 Oral     SpO2 01/29/19 2141 95 %     Weight 01/29/19 2141 175 lb (79.4 kg)     Height 01/29/19 2141 5\' 5"  (1.651 m)     Head Circumference --      Peak Flow --      Pain Score 01/29/19 2146 5     Pain Loc --      Pain Edu? --      Excl. in [place]? --     Constitutional: Alert and oriented. Well appearing and in no acute distress. Eyes: Conjunctivae are normal. PERRL. EOMI. Head: Atraumatic. Nose: No congestion/rhinnorhea. Mouth/Throat: Mucous membranes are moist.  Oropharynx  non-erythematous. Neck: No stridor.  No meningeal signs.   Cardiovascular: Normal rate, regular rhythm. Good peripheral circulation. Grossly normal heart sounds.   Respiratory: Normal respiratory effort.  No retractions. Lungs CTAB. Gastrointestinal: Soft and nontender. No distention.  Musculoskeletal: No cervical spine tenderness, thoracic spine tenderness or Lumbar spine tenderness.  No tenderness or pain with palpation and full ROM of all joints in upper and lower extremities.  No ecchymosis or other signs of trauma on back or extremities.  No Pain with AP or lateral compression of ribs.  No Paracervical ttp, paraspinal ttp Neurologic:  Normal speech and language. No gross focal neurologic deficits are appreciated.  Skin:  Skin is warm, dry and intact. No rash noted.   ____________________________________________     INITIAL IMPRESSION / ASSESSMENT AND PLAN / ED COURSE  No pain, tenderness or other indication for imaging. Blood thinners no concern for occult fracture is that she is able to ambulate without difficulty has no tenderness.  Discharged with Tylenol as needed.     Pertinent labs & imaging results that were available during my care of the patient were reviewed by me and considered in my medical decision making (see chart for details).  A medical screening exam was performed and I feel the patient has had an appropriate workup for their chief complaint at this time and likelihood of emergent condition existing is low. They have been counseled on decision, discharge, follow up and which symptoms necessitate immediate return to the emergency department. They or their family verbally stated understanding and agreement with plan and discharged in stable condition.   ____________________________________________  FINAL CLINICAL IMPRESSION(S) / ED DIAGNOSES  Final diagnoses:  Fall, initial encounter     MEDICATIONS GIVEN DURING THIS VISIT:  Medications - No data to display    NEW OUTPATIENT MEDICATIONS STARTED DURING THIS VISIT:  Current Discharge Medication List      Note:  This note was prepared with assistance of Dragon voice recognition software. Occasional wrong-word or sound-a-like substitutions may have occurred due to the inherent limitations of voice recognition software.   Merrily Pew, MD 01/29/19 2356

## 2019-01-30 NOTE — ED Notes (Signed)
[facility] called: [phone] Told this nurse that transportation will be called.

## 2019-06-14 ENCOUNTER — Encounter (HOSPITAL_COMMUNITY): Payer: Self-pay | Admitting: *Deleted

## 2019-06-14 ENCOUNTER — Emergency Department (HOSPITAL_COMMUNITY)
Admission: EM | Admit: 2019-06-14 | Discharge: 2019-06-14 | Disposition: A | Discharge status: Home / Self Care | Payer: Medicare Other | Attending: Emergency Medicine | Admitting: Emergency Medicine

## 2019-06-14 ENCOUNTER — Other Ambulatory Visit: Payer: Self-pay

## 2019-06-14 DIAGNOSIS — E162 Hypoglycemia, unspecified: Secondary | ICD-10-CM

## 2019-06-14 DIAGNOSIS — Z79899 Other long term (current) drug therapy: Secondary | ICD-10-CM | POA: Insufficient documentation

## 2019-06-14 DIAGNOSIS — E039 Hypothyroidism, unspecified: Secondary | ICD-10-CM | POA: Insufficient documentation

## 2019-06-14 DIAGNOSIS — I10 Essential (primary) hypertension: Secondary | ICD-10-CM | POA: Insufficient documentation

## 2019-06-14 DIAGNOSIS — E11649 Type 2 diabetes mellitus with hypoglycemia without coma: Secondary | ICD-10-CM | POA: Diagnosis not present

## 2019-06-14 DIAGNOSIS — Z794 Long term (current) use of insulin: Secondary | ICD-10-CM | POA: Diagnosis not present

## 2019-06-14 DIAGNOSIS — F17210 Nicotine dependence, cigarettes, uncomplicated: Secondary | ICD-10-CM | POA: Diagnosis not present

## 2019-06-14 DIAGNOSIS — N179 Acute kidney failure, unspecified: Secondary | ICD-10-CM | POA: Insufficient documentation

## 2019-06-14 DIAGNOSIS — J449 Chronic obstructive pulmonary disease, unspecified: Secondary | ICD-10-CM | POA: Diagnosis not present

## 2019-06-14 DIAGNOSIS — R4182 Altered mental status, unspecified: Secondary | ICD-10-CM | POA: Diagnosis present

## 2019-06-14 LAB — CBG MONITORING, ED
Glucose-Capillary: 126 mg/dL — ABNORMAL HIGH (ref 70–99)
Glucose-Capillary: 12 mg/dL — CL (ref 70–99)
Glucose-Capillary: 13 mg/dL — CL (ref 70–99)
Glucose-Capillary: 159 mg/dL — ABNORMAL HIGH (ref 70–99)
Glucose-Capillary: 119 mg/dL — ABNORMAL HIGH (ref 70–99)
Glucose-Capillary: 166 mg/dL — ABNORMAL HIGH (ref 70–99)

## 2019-06-14 LAB — CBC WITH DIFFERENTIAL/PLATELET
Abs Immature Granulocytes: 0.03 10*3/uL (ref 0.00–0.07)
Basophils Absolute: 0.1 10*3/uL (ref 0.0–0.1)
Basophils Relative: 0 %
Eosinophils Absolute: 0.3 10*3/uL (ref 0.0–0.5)
Eosinophils Relative: 2 %
HCT: 32.8 % — ABNORMAL LOW (ref 36.0–46.0)
Hemoglobin: 10.2 g/dL — ABNORMAL LOW (ref 12.0–15.0)
Immature Granulocytes: 0 %
Lymphocytes Relative: 23 %
Lymphs Abs: 2.9 10*3/uL (ref 0.7–4.0)
MCH: 29.1 pg (ref 26.0–34.0)
MCHC: 31.1 g/dL (ref 30.0–36.0)
MCV: 93.7 fL (ref 80.0–100.0)
Monocytes Absolute: 0.7 10*3/uL (ref 0.1–1.0)
Monocytes Relative: 6 %
Neutro Abs: 8.6 10*3/uL — ABNORMAL HIGH (ref 1.7–7.7)
Neutrophils Relative %: 69 %
Platelets: 318 10*3/uL (ref 150–400)
RBC: 3.5 MIL/uL — ABNORMAL LOW (ref 3.87–5.11)
RDW: 14 % (ref 11.5–15.5)
WBC: 12.5 10*3/uL — ABNORMAL HIGH (ref 4.0–10.5)
nRBC: 0 % (ref 0.0–0.2)

## 2019-06-14 LAB — URINALYSIS, ROUTINE W REFLEX MICROSCOPIC
Bilirubin Urine: NEGATIVE
Glucose, UA: NEGATIVE mg/dL
Hgb urine dipstick: NEGATIVE
Ketones, ur: NEGATIVE mg/dL
Nitrite: NEGATIVE
Protein, ur: NEGATIVE mg/dL
Specific Gravity, Urine: 1.012 (ref 1.005–1.030)
pH: 5 (ref 5.0–8.0)

## 2019-06-14 LAB — COMPREHENSIVE METABOLIC PANEL
ALT: 23 U/L (ref 0–44)
AST: 38 U/L (ref 15–41)
Albumin: 3.6 g/dL (ref 3.5–5.0)
Alkaline Phosphatase: 73 U/L (ref 38–126)
Anion gap: 10 (ref 5–15)
BUN: 28 mg/dL — ABNORMAL HIGH (ref 6–20)
CO2: 27 mmol/L (ref 22–32)
Calcium: 9.2 mg/dL (ref 8.9–10.3)
Chloride: 102 mmol/L (ref 98–111)
Creatinine, Ser: 1.14 mg/dL — ABNORMAL HIGH (ref 0.44–1.00)
GFR calc Af Amer: >60 mL/min (ref >60)
GFR calc non Af Amer: 53 mL/min — ABNORMAL LOW (ref >60)
Glucose, Bld: 65 mg/dL — ABNORMAL LOW (ref 70–99)
Potassium: 4.4 mmol/L (ref 3.5–5.1)
Sodium: 139 mmol/L (ref 135–145)
Total Bilirubin: 0.6 mg/dL (ref 0.3–1.2)
Total Protein: 6.2 g/dL — ABNORMAL LOW (ref 6.5–8.1)

## 2019-06-14 MED ORDER — DEXTROSE 50 % IV SOLN
INTRAVENOUS | Status: AC
  Filled 2019-06-14: qty 50

## 2019-06-14 MED ORDER — SODIUM CHLORIDE 0.9 % IV BOLUS
1000.0000 mL | Freq: Once | INTRAVENOUS | Status: AC
  Administered 2019-06-14: 15:00:00 1000 mL via INTRAVENOUS

## 2019-06-14 NOTE — ED Notes (Signed)
Pt found wandering in the hallway; pt placed in wheelchair at nurses station until her ride is here to pick her up

## 2019-06-14 NOTE — ED Notes (Signed)
Pt unable to state where she is at, thinks that today is Sunday and that her birthday in 1981.

## 2019-06-14 NOTE — ED Notes (Signed)
Meal given

## 2019-06-14 NOTE — ED Provider Notes (Signed)
[facility] EMERGENCY DEPARTMENT Provider Note   CSN: [id number] Arrival date & time: 06/14/19  [id number]     History Chief Complaint  Patient presents with  . Altered Mental Status    Carrie Clark is a 59 y.o. female with PMHx HTN, hypothyroidism, COPD, schizophrenia, CVA who presents to the ED via EMS for altered mental status.  Per triage report patient [facility] [place].  Her glucose was found to be 59 this a.m. at group home and she was given insulin, patient then became lethargic and EMS was called.  Patient was given glucagon in route.  On arrival CBG initially 99, repeat 125. Per EMS pt is currently at her baseline. Patient has no complaints currently.  She is requesting something to eat and drink.   History obtained by nursing staff at group home.  They report that they were told by the nurse practitioner at the facility that patient needs her insulin regardless if her blood sugars are low.  She reports that they checked her blood sugar this morning and it was 59, they report that she was acting different than normal which concerned them.  Patient was given her insulin by another nursing staff when she became lethargic and this is when EMS was called.  They report that yesterday her blood sugars were low as well although they cannot say if patient received her insulin yesterday morning.  Prior to this morning patient has been acting at baseline.  They deny any recent infectious type symptoms, nausea, vomiting, diarrhea, loss of appetite.   The history is provided by the patient, the nursing home and medical records.       Past Medical History:  Diagnosis Date  . COPD (chronic obstructive pulmonary disease) (HCC)   . CVA (cerebral infarction)   . Depression   . Hypertension   . Hypothyroidism   . Schizoaffective disorder, bipolar type (HCC)   . Schizophrenia (HCC)   . Stroke (HCC)   . Tardive dyskinesia   . Tardive dyskinesia      Patient Active Problem List   Diagnosis Date Noted  . UTI (urinary tract infection) 12/03/2018  . Hypothyroidism 12/03/2018  . HTN (hypertension) 12/03/2018  . COPD (chronic obstructive pulmonary disease) (HCC) 12/03/2018  . Diabetes (HCC) 12/03/2018  . Altered mental status 12/03/2018  . Schizoaffective disorder, bipolar type (HCC) 09/11/2015  . Aggression   . Episode of behavior change   . Psychoses [facility](HCC)     Past Surgical History:  Procedure Laterality Date  . NO PAST SURGERIES       OB History   No obstetric history on file.     Family History  Family history unknown: Yes    Social History   Tobacco Use  . Smoking status: Current Every Day Smoker    Packs/day: 1.00    Types: Cigarettes  . Smokeless tobacco: Never Used  Substance Use Topics  . Alcohol use: No  . Drug use: No    Home Medications Prior to Admission medications   Medication Sig Start Date End Date Taking? Authorizing Provider  acetaminophen (TYLENOL) 325 MG tablet Take 2 tablets (650 mg total) by mouth every 6 (six) hours as needed for moderate pain. 01/29/19   Mesner, Barbara CowerJason, MD  albuterol (PROVENTIL HFA;VENTOLIN HFA) 108 (90 Base) MCG/ACT inhaler Inhale 2 puffs into the lungs every 6 (six) hours as needed for wheezing or shortness of breath. 06/07/15   Mesner, Barbara CowerJason, MD  atorvastatin (LIPITOR)  40 MG tablet Take 40 mg by mouth daily.     [provider]  benztropine (COGENTIN) 1 MG tablet Take 1 mg by mouth at bedtime.    [provider]  cephALEXin (KEFLEX) 500 MG capsule Take 1 capsule (500 mg total) by mouth 4 (four) times daily. 01/03/19   Triplett, Tammy, PA-C  cetirizine (ZYRTEC) 10 MG tablet Take 1 tablet (10 mg total) by mouth daily. 11/11/14   Withrow, Everardo All, FNP  donepezil (ARICEPT) 5 MG tablet Take 5 mg by mouth at bedtime.    [provider]  famotidine (PEPCID) 20 MG tablet Take 1 tablet (20 mg total) by mouth 2 (two) times daily. 11/11/14   Withrow, Everardo All,  FNP  fluPHENAZine (PROLIXIN) 10 MG tablet Take 1 tablet (10 mg total) by mouth 3 (three) times daily. 12/03/18   Adrian Saran, MD  fluticasone (FLONASE) 50 MCG/ACT nasal spray Place 2 sprays into both nostrils daily.    [provider]  furosemide (LASIX) 20 MG tablet Take 1 tablet (20 mg total) by mouth daily. 11/11/14   Withrow, Everardo All, FNP  guaiFENesin (ROBITUSSIN) 100 MG/5ML SOLN Take 10 mLs by mouth every 4 (four) hours as needed for cough or to loosen phlegm.    [provider]  Insulin Detemir (LEVEMIR) 100 UNIT/ML Pen Inject 25 Units into the skin 2 (two) times daily. Before breakfast and dinner     [provider]  levothyroxine (SYNTHROID) 100 MCG tablet Take 100 mcg by mouth daily before breakfast.     [provider]  lisinopril (PRINIVIL,ZESTRIL) 5 MG tablet Take 5 mg by mouth daily.    [provider]  LORazepam (ATIVAN) 1 MG tablet Take 1 tablet (1 mg total) by mouth 2 (two) times daily as needed for anxiety (agitation). 12/03/18   Adrian Saran, MD  lurasidone (LATUDA) 20 MG TABS tablet Take 20 mg by mouth daily. Take 20 mg PO at 1700 with a meal    [provider]  metFORMIN (GLUCOPHAGE) 1000 MG tablet Take 1 tablet (1,000 mg total) by mouth 2 (two) times daily. 09/11/15   Charm Rings, NP  mirtazapine (REMERON) 15 MG tablet Take 15 mg by mouth at bedtime.    [provider]  Multiple Vitamins-Minerals (MULTIVITAMIN WITH MINERALS) tablet Take 1 tablet by mouth daily. 11/11/14   Withrow, Everardo All, FNP  Oxcarbazepine (TRILEPTAL) 300 MG tablet Take 1 tablet (300 mg total) by mouth 2 (two) times daily. 09/11/15   Charm Rings, NP  potassium chloride (K-DUR,KLOR-CON) 10 MEQ tablet Take 1 tablet (10 mEq total) by mouth daily. 11/11/14   Withrow, Everardo All, FNP  prazosin (MINIPRESS) 1 MG capsule Take 1 mg by mouth at bedtime.    [provider]  tiotropium (SPIRIVA) 18 MCG inhalation capsule Place 18 mcg into inhaler and inhale  daily.    [provider]  zolpidem (AMBIEN) 5 MG tablet Take 5 mg by mouth at bedtime.    [provider]    Allergies    Fish allergy  Review of Systems   Review of Systems  Constitutional: Negative for chills and fever.  Eyes: Negative for visual disturbance.  Respiratory: Negative for cough and shortness of breath.   Cardiovascular: Negative for chest pain.  Gastrointestinal: Negative for abdominal pain, nausea and vomiting.  Genitourinary: Negative for difficulty urinating.  Musculoskeletal: Negative for myalgias.  Skin: Negative for rash.  Psychiatric/Behavioral: Positive for confusion.  All other systems reviewed and  are negative.   Physical Exam Updated Vital Signs BP (!) 94/59 (BP Location: Left Arm)   Pulse 81   Temp 98 F (36.7 C) (Oral)   Resp (!) 22   Ht 5\' 5"  (1.651 m)   Wt 81.6 kg   SpO2 96%   BMI 29.95 kg/m   Physical Exam Vitals and nursing note reviewed.  Constitutional:      Appearance: She is not ill-appearing or diaphoretic.  HENT:     Head: Normocephalic and atraumatic.  Eyes:     Extraocular Movements: Extraocular movements intact.     Conjunctiva/sclera: Conjunctivae normal.     Pupils: Pupils are equal, round, and reactive to light.  Cardiovascular:     Rate and Rhythm: Normal rate and regular rhythm.     Pulses: Normal pulses.  Pulmonary:     Effort: Pulmonary effort is normal.     Breath sounds: Normal breath sounds. No wheezing, rhonchi or rales.  Abdominal:     Palpations: Abdomen is soft.     Tenderness: There is no abdominal tenderness. There is no right CVA tenderness, left CVA tenderness, guarding or rebound.  Musculoskeletal:     Cervical back: Normal range of motion and neck supple.     Right lower leg: No edema.     Left lower leg: No edema.  Skin:    General: Skin is warm and dry.  Neurological:     General: No focal deficit present.     Mental Status: She is alert and oriented to person, place, and  time.     Cranial Nerves: No cranial nerve deficit.     ED Results / Procedures / Treatments   Labs (all labs ordered are listed, but only abnormal results are displayed) Labs Reviewed  COMPREHENSIVE METABOLIC PANEL - Abnormal; Notable for the following components:      Result Value   Glucose, Bld 65 (*)    BUN 28 (*)    Creatinine, Ser 1.14 (*)    Total Protein 6.2 (*)    GFR calc non Af Amer 53 (*)    All other components within normal limits  CBC WITH DIFFERENTIAL/PLATELET - Abnormal; Notable for the following components:   WBC 12.5 (*)    RBC 3.50 (*)    Hemoglobin 10.2 (*)    HCT 32.8 (*)    Neutro Abs 8.6 (*)    All other components within normal limits  URINALYSIS, ROUTINE W REFLEX MICROSCOPIC - Abnormal; Notable for the following components:   APPearance HAZY (*)    Leukocytes,Ua TRACE (*)    Bacteria, UA MANY (*)    All other components within normal limits  CBG MONITORING, ED - Abnormal; Notable for the following components:   Glucose-Capillary 126 (*)    All other components within normal limits  CBG MONITORING, ED - Abnormal; Notable for the following components:   Glucose-Capillary 17 (*)    All other components within normal limits  CBG MONITORING, ED - Abnormal; Notable for the following components:   Glucose-Capillary 15 (*)    All other components within normal limits  CBG MONITORING, ED - Abnormal; Notable for the following components:   Glucose-Capillary 13 (*)    All other components within normal limits  CBG MONITORING, ED - Abnormal; Notable for the following components:   Glucose-Capillary 12 (*)    All other components within normal limits  CBG MONITORING, ED - Abnormal; Notable for the following components:   Glucose-Capillary 159 (*)  All other components within normal limits  URINE CULTURE    EKG None  Radiology No results found.  Procedures Procedures (including critical care time)  Medications Ordered in ED Medications   sodium chloride 0.9 % bolus 1,000 mL (1,000 mLs Intravenous New Bag/Given 06/14/19 1526)  dextrose 50 % solution (  Given 06/14/19 1625)    ED Course  I have reviewed the triage vital signs and the nursing notes.  Pertinent labs & imaging results that were available during my care of the patient were reviewed by me and considered in my medical decision making (see chart for details).  59 year old female presents to the ED with altered mental status.  Found to have a CBG of 59 at group home earlier today and was given insulin despite hypoglycemia which made patient very lethargic.  On EMS arrival CBG in the 30s.  Given amp of D50 and transferred here to the ED.  On arrival to the ED CBG in the 90s.  On repeat prior to being seen in the 120s.  Patient is alert and oriented x3.  EMS reports pt is at her baseline as they are aware of patient and her normal neurocognitive state. She is currently requesting something to eat and drink.  She has no complaints currently.  Denies any infectious etiology prior to having low blood sugar today.  States she has been eating and drinking normally.  No signs of hypovolemia.  She has no focal neuro deficits on exam and is back to baseline.  I had lengthy discussion with nursing staff at group home regarding the fact that they do not need to give insulin if her blood sugars are in the 50s.  I have advised that they discuss this with their nurse practitioner as they report they were told they need to give it regardless of what her blood sugar is in the morning.  Will obtain screening labs at this time urinalysis.  Patient is eating and drinking.  Will repeat CBG later on.   CBC with a leukocytosis of 12,000 although this appears to be around patient's baseline.  Hemoglobin stable compared to baseline.  CMP with a creatinine of 1.14 and a GFR 53, patient's baseline around 0.80 creatinine.  BUN mildly elevated as well as suggest dehydration.  Will give 1 L fluid bolus.    Urinalysis with trace leuks, 6-10 white blood cells, many bacteria.  Patient is denying any urinary symptoms at this time.  Will send for culture.  Prior to patient being discharged home I requested repeat vitals and repeat CBG.  Nursing staff informed that CBG 15.  On reevaluation patient is sitting upright in chair alert and oriented.  He repeated the CBG twice and once on an additional machine with similar readings.  Another amp of D50 was ordered.  Afterwards patient states she is hungry and wants to eat, will give her food. She will need to stay for further evaluation to ensure that her blood sugars do not keep continuously dropping.   Case discussed with Dr. Hyacinth Meeker attending physician who is aware of plan and will take over case.     MDM Rules/Calculators/A&P                       Final Clinical Impression(s) / ED Diagnoses Final diagnoses:  Hypoglycemia  AKI (acute kidney injury) (HCC)    Rx / DC Orders ED Discharge Orders    None       Hyman Hopes, [place],  PA-C 06/14/19 1719    Eber Hong, MD 06/14/19 [phone]

## 2019-06-14 NOTE — Discharge Instructions (Addendum)
Blood sugars have remained stable in the ED today. Please do not give patient Insulin if she is already hypoglycemic.   Please check blood sugar every 2 hours throughout the evening to make sure she is not continuing to drop, if it drops below 80 please give her something to eat and recheck in 30 minutes  Emergency department for worsening altered mental status or lowering of blood sugar that will not respond to food.  Please hold insulin for this evening  She will need to have her kidney function (creatinine) rechecked in 1-2 weeks. It was mildly elevated today; she received fluids. She will need to increase her fluid intake for the next few days as well.

## 2019-06-14 NOTE — ED Triage Notes (Signed)
Blood glucose 59 this am per group home, insulin was given, pt became lethargic, EMS called

## 2019-06-14 NOTE — ED Notes (Signed)
Patient stated she refuses to give ua until she receives lunch

## 2019-06-15 LAB — CBG MONITORING, ED
Glucose-Capillary: 15 mg/dL — CL (ref 70–99)
Glucose-Capillary: 17 mg/dL — CL (ref 70–99)

## 2019-06-15 LAB — URINE CULTURE

## 2019-11-08 ENCOUNTER — Emergency Department
Admission: EM | Admit: 2019-11-08 | Discharge: 2019-11-10 | Disposition: A | Discharge status: Home / Self Care | Payer: Medicare Other | Attending: Emergency Medicine | Admitting: Emergency Medicine

## 2019-11-08 ENCOUNTER — Other Ambulatory Visit: Payer: Self-pay

## 2019-11-08 ENCOUNTER — Encounter: Payer: Self-pay | Admitting: Emergency Medicine

## 2019-11-08 ENCOUNTER — Emergency Department: Payer: Medicare Other

## 2019-11-08 DIAGNOSIS — F250 Schizoaffective disorder, bipolar type: Secondary | ICD-10-CM | POA: Diagnosis present

## 2019-11-08 DIAGNOSIS — Z79899 Other long term (current) drug therapy: Secondary | ICD-10-CM | POA: Diagnosis not present

## 2019-11-08 DIAGNOSIS — N390 Urinary tract infection, site not specified: Secondary | ICD-10-CM | POA: Diagnosis present

## 2019-11-08 DIAGNOSIS — R4182 Altered mental status, unspecified: Secondary | ICD-10-CM | POA: Diagnosis present

## 2019-11-08 DIAGNOSIS — Z794 Long term (current) use of insulin: Secondary | ICD-10-CM | POA: Insufficient documentation

## 2019-11-08 DIAGNOSIS — M545 Low back pain, unspecified: Secondary | ICD-10-CM

## 2019-11-08 DIAGNOSIS — Z20822 Contact with and (suspected) exposure to covid-19: Secondary | ICD-10-CM | POA: Diagnosis not present

## 2019-11-08 DIAGNOSIS — Z8673 Personal history of transient ischemic attack (TIA), and cerebral infarction without residual deficits: Secondary | ICD-10-CM | POA: Insufficient documentation

## 2019-11-08 DIAGNOSIS — Z046 Encounter for general psychiatric examination, requested by authority: Secondary | ICD-10-CM | POA: Diagnosis present

## 2019-11-08 DIAGNOSIS — G2401 Drug induced subacute dyskinesia: Secondary | ICD-10-CM | POA: Diagnosis not present

## 2019-11-08 DIAGNOSIS — J449 Chronic obstructive pulmonary disease, unspecified: Secondary | ICD-10-CM | POA: Diagnosis not present

## 2019-11-08 DIAGNOSIS — E039 Hypothyroidism, unspecified: Secondary | ICD-10-CM | POA: Diagnosis present

## 2019-11-08 DIAGNOSIS — F29 Unspecified psychosis not due to a substance or known physiological condition: Secondary | ICD-10-CM | POA: Diagnosis present

## 2019-11-08 DIAGNOSIS — R4689 Other symptoms and signs involving appearance and behavior: Secondary | ICD-10-CM | POA: Diagnosis present

## 2019-11-08 DIAGNOSIS — E119 Type 2 diabetes mellitus without complications: Secondary | ICD-10-CM

## 2019-11-08 DIAGNOSIS — I10 Essential (primary) hypertension: Secondary | ICD-10-CM | POA: Diagnosis present

## 2019-11-08 LAB — SARS CORONAVIRUS 2 BY RT PCR (HOSPITAL ORDER, PERFORMED IN ~~LOC~~ HOSPITAL LAB): SARS Coronavirus 2: NEGATIVE

## 2019-11-08 LAB — URINALYSIS, COMPLETE (UACMP) WITH MICROSCOPIC
Bilirubin Urine: NEGATIVE
Glucose, UA: NEGATIVE mg/dL
Hgb urine dipstick: NEGATIVE
Ketones, ur: NEGATIVE mg/dL
Nitrite: POSITIVE — AB
Protein, ur: NEGATIVE mg/dL
Specific Gravity, Urine: 1.012 (ref 1.005–1.030)
pH: 6 (ref 5.0–8.0)

## 2019-11-08 LAB — COMPREHENSIVE METABOLIC PANEL
ALT: 14 U/L (ref 0–44)
AST: 17 U/L (ref 15–41)
Albumin: 3.7 g/dL (ref 3.5–5.0)
Alkaline Phosphatase: 86 U/L (ref 38–126)
Anion gap: 9 (ref 5–15)
BUN: 14 mg/dL (ref 6–20)
CO2: 30 mmol/L (ref 22–32)
Calcium: 9.2 mg/dL (ref 8.9–10.3)
Chloride: 99 mmol/L (ref 98–111)
Creatinine, Ser: 0.99 mg/dL (ref 0.44–1.00)
GFR calc Af Amer: >60 mL/min (ref >60)
GFR calc non Af Amer: >60 mL/min (ref >60)
Glucose, Bld: 146 mg/dL — ABNORMAL HIGH (ref 70–99)
Potassium: 4.5 mmol/L (ref 3.5–5.1)
Sodium: 138 mmol/L (ref 135–145)
Total Bilirubin: 0.3 mg/dL (ref 0.3–1.2)
Total Protein: 6.5 g/dL (ref 6.5–8.1)

## 2019-11-08 LAB — CBC
HCT: 31.9 % — ABNORMAL LOW (ref 36.0–46.0)
Hemoglobin: 10 g/dL — ABNORMAL LOW (ref 12.0–15.0)
MCH: 28.7 pg (ref 26.0–34.0)
MCHC: 31.3 g/dL (ref 30.0–36.0)
MCV: 91.7 fL (ref 80.0–100.0)
Platelets: 337 10*3/uL (ref 150–400)
RBC: 3.48 MIL/uL — ABNORMAL LOW (ref 3.87–5.11)
RDW: 14.8 % (ref 11.5–15.5)
WBC: 12 10*3/uL — ABNORMAL HIGH (ref 4.0–10.5)
nRBC: 0 % (ref 0.0–0.2)

## 2019-11-08 LAB — URINE DRUG SCREEN, QUALITATIVE (ARMC ONLY)
Amphetamines, Ur Screen: NOT DETECTED
Barbiturates, Ur Screen: NOT DETECTED
Benzodiazepine, Ur Scrn: NOT DETECTED
Cannabinoid 50 Ng, Ur ~~LOC~~: NOT DETECTED
Cocaine Metabolite,Ur ~~LOC~~: NOT DETECTED
MDMA (Ecstasy)Ur Screen: NOT DETECTED
Methadone Scn, Ur: NOT DETECTED
Opiate, Ur Screen: NOT DETECTED
Phencyclidine (PCP) Ur S: NOT DETECTED
Tricyclic, Ur Screen: NOT DETECTED

## 2019-11-08 LAB — ACETAMINOPHEN LEVEL: Acetaminophen (Tylenol), Serum: <10 ug/mL — ABNORMAL LOW (ref 10–30)

## 2019-11-08 LAB — POCT PREGNANCY, URINE: Preg Test, Ur: NEGATIVE

## 2019-11-08 LAB — ETHANOL: Alcohol, Ethyl (B): <10 mg/dL (ref <10)

## 2019-11-08 LAB — SALICYLATE LEVEL: Salicylate Lvl: <7 mg/dL — ABNORMAL LOW (ref 7.0–30.0)

## 2019-11-08 MED ORDER — METFORMIN HCL 500 MG PO TABS
1000.0000 mg | ORAL_TABLET | Freq: Two times a day (BID) | ORAL | Status: DC
  Administered 2019-11-09 – 2019-11-10 (×4): 1000 mg via ORAL
  Filled 2019-11-08 (×4): qty 2

## 2019-11-08 MED ORDER — LORAZEPAM 1 MG PO TABS
1.0000 mg | ORAL_TABLET | Freq: Two times a day (BID) | ORAL | Status: DC | PRN
  Administered 2019-11-08 – 2019-11-09 (×2): 1 mg via ORAL
  Filled 2019-11-08 (×2): qty 1

## 2019-11-08 MED ORDER — ZOLPIDEM TARTRATE 5 MG PO TABS
5.0000 mg | ORAL_TABLET | Freq: Every day | ORAL | Status: DC
  Administered 2019-11-08 – 2019-11-09 (×2): 5 mg via ORAL
  Filled 2019-11-08 (×2): qty 1

## 2019-11-08 MED ORDER — PRAZOSIN HCL 1 MG PO CAPS
1.0000 mg | ORAL_CAPSULE | Freq: Every day | ORAL | Status: DC
  Administered 2019-11-09: 1 mg via ORAL
  Filled 2019-11-08 (×3): qty 1

## 2019-11-08 MED ORDER — FUROSEMIDE 20 MG PO TABS
20.0000 mg | ORAL_TABLET | Freq: Every day | ORAL | Status: DC
  Administered 2019-11-09 – 2019-11-10 (×2): 20 mg via ORAL
  Filled 2019-11-08 (×2): qty 1

## 2019-11-08 MED ORDER — TIOTROPIUM BROMIDE MONOHYDRATE 18 MCG IN CAPS
18.0000 ug | ORAL_CAPSULE | Freq: Every day | RESPIRATORY_TRACT | Status: DC
  Administered 2019-11-09 – 2019-11-10 (×2): 18 ug via RESPIRATORY_TRACT
  Filled 2019-11-08: qty 5

## 2019-11-08 MED ORDER — DONEPEZIL HCL 5 MG PO TABS
5.0000 mg | ORAL_TABLET | Freq: Every day | ORAL | Status: DC
  Administered 2019-11-09: 5 mg via ORAL
  Filled 2019-11-08 (×3): qty 1

## 2019-11-08 MED ORDER — BENZTROPINE MESYLATE 1 MG PO TABS
1.0000 mg | ORAL_TABLET | Freq: Two times a day (BID) | ORAL | Status: DC
  Administered 2019-11-08 – 2019-11-10 (×4): 1 mg via ORAL
  Filled 2019-11-08 (×4): qty 1

## 2019-11-08 MED ORDER — LURASIDONE HCL 40 MG PO TABS
20.0000 mg | ORAL_TABLET | Freq: Every day | ORAL | Status: DC
  Administered 2019-11-09 – 2019-11-10 (×2): 20 mg via ORAL
  Filled 2019-11-08 (×2): qty 1

## 2019-11-08 MED ORDER — ATORVASTATIN CALCIUM 20 MG PO TABS
40.0000 mg | ORAL_TABLET | Freq: Every day | ORAL | Status: DC
  Administered 2019-11-08 – 2019-11-09 (×2): 40 mg via ORAL
  Filled 2019-11-08 (×2): qty 2

## 2019-11-08 MED ORDER — CEPHALEXIN 500 MG PO CAPS
500.0000 mg | ORAL_CAPSULE | Freq: Two times a day (BID) | ORAL | Status: DC
  Administered 2019-11-08 – 2019-11-10 (×4): 500 mg via ORAL
  Filled 2019-11-08 (×5): qty 1

## 2019-11-08 MED ORDER — POTASSIUM CHLORIDE CRYS ER 20 MEQ PO TBCR
10.0000 meq | EXTENDED_RELEASE_TABLET | Freq: Every day | ORAL | Status: DC
  Administered 2019-11-09 – 2019-11-10 (×2): 10 meq via ORAL
  Filled 2019-11-08 (×2): qty 1

## 2019-11-08 MED ORDER — FLUPHENAZINE HCL 5 MG PO TABS
10.0000 mg | ORAL_TABLET | Freq: Three times a day (TID) | ORAL | Status: DC
  Administered 2019-11-09 – 2019-11-10 (×5): 10 mg via ORAL
  Filled 2019-11-08 (×7): qty 2

## 2019-11-08 MED ORDER — LEVOTHYROXINE SODIUM 75 MCG PO TABS
100.0000 ug | ORAL_TABLET | Freq: Every day | ORAL | Status: DC
  Administered 2019-11-09 – 2019-11-10 (×2): 100 ug via ORAL
  Filled 2019-11-08 (×2): qty 1

## 2019-11-08 MED ORDER — ALBUTEROL SULFATE HFA 108 (90 BASE) MCG/ACT IN AERS
2.0000 | INHALATION_SPRAY | Freq: Four times a day (QID) | RESPIRATORY_TRACT | Status: DC | PRN
  Filled 2019-11-08: qty 6.7

## 2019-11-08 MED ORDER — MIRTAZAPINE 15 MG PO TABS
15.0000 mg | ORAL_TABLET | Freq: Every day | ORAL | Status: DC
  Administered 2019-11-08 – 2019-11-09 (×2): 15 mg via ORAL
  Filled 2019-11-08 (×2): qty 1

## 2019-11-08 MED ORDER — OXCARBAZEPINE 300 MG PO TABS
300.0000 mg | ORAL_TABLET | Freq: Two times a day (BID) | ORAL | Status: DC
  Administered 2019-11-09 – 2019-11-10 (×3): 300 mg via ORAL
  Filled 2019-11-08 (×5): qty 1

## 2019-11-08 MED ORDER — INSULIN DETEMIR 100 UNIT/ML ~~LOC~~ SOLN
16.0000 [IU] | Freq: Two times a day (BID) | SUBCUTANEOUS | Status: DC
  Administered 2019-11-09 – 2019-11-10 (×3): 16 [IU] via SUBCUTANEOUS
  Filled 2019-11-08 (×6): qty 0.16

## 2019-11-08 MED ORDER — LISINOPRIL 5 MG PO TABS
5.0000 mg | ORAL_TABLET | Freq: Every day | ORAL | Status: DC
  Administered 2019-11-09 – 2019-11-10 (×2): 5 mg via ORAL
  Filled 2019-11-08 (×2): qty 1

## 2019-11-08 NOTE — ED Notes (Signed)
Pt brought into ED BHU via sally port and wand with metal detector for safety by [place] Security officer. Patient oriented to unit/care area: Pt informed of unit policies and procedures.  Informed that, for their safety, care areas are designed for safety and monitored by security cameras at all times. Patient verbalizes understanding, and verbal contract for safety obtained.Pt shown to their room.  

## 2019-11-08 NOTE — ED Notes (Signed)
Vol moved to bhu 3

## 2019-11-08 NOTE — ED Notes (Signed)
Writer was unable to assess patient. Patient was not alert enough to participate in assessment. Patient should be reassessed.
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
## 2019-11-08 NOTE — ED Notes (Addendum)
BIB EMS for "psych problems" reports that pt is easily angered and combative. Pt lives at [facility] care [facility] and arrives with most recent med list.  Vedia Pereyra contacted, Administrator on Call for [facility] who states pt does not have a legal guardian and she makes her own decisions. [phone]

## 2019-11-08 NOTE — ED Notes (Signed)
Pt brought into ED BHU via sally port and wand with metal detector for safety by [place] Security officer. Patient oriented to unit/care area: Pt informed of unit policies and procedures.  Informed that, for their safety, care areas are designed for safety and monitored by security cameras at all times. Patient verbalizes understanding, and verbal contract for safety obtained.Pt shown to their room.  

## 2019-11-08 NOTE — ED Notes (Addendum)
Pt dressed out into hospital provided burgundy scrubs with this tech and Melanie, EDT in the rm. Pt belongings consist of brown shoes, gray/white socks, gray pants, pink-burgundy sweater, a black hair bow and a zebra print tank top. Pt denies having any money or cell phone on her. Pt calm and cooperative while dressing out. Pt belongings placed into a pt belongings bag and labeled with pt name.

## 2019-11-08 NOTE — ED Triage Notes (Signed)
Pt via ems from home with mental health stress. Pt believes she is here for a replacement spleen. Denies SI, HI. Pt alert, oriented to self.

## 2019-11-09 DIAGNOSIS — M545 Low back pain: Secondary | ICD-10-CM | POA: Diagnosis not present

## 2019-11-09 LAB — URINE CULTURE

## 2019-11-09 NOTE — ED Notes (Signed)
Awaiting medications from pharmacy but pt request to not be woken up if she was asleep when they came.
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
## 2019-11-09 NOTE — ED Notes (Signed)
VOL  PENDING  PLACEMENT 
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
## 2019-11-10 DIAGNOSIS — M545 Low back pain: Secondary | ICD-10-CM | POA: Diagnosis not present

## 2019-11-10 NOTE — Discharge Instructions (Addendum)
Please seek medical attention and help for any thoughts about wanting to harm yourself, harm others, any concerning change in behavior, severe depression, inappropriate drug use or any other new or concerning symptoms. ° °

## 2019-11-10 NOTE — ED Notes (Signed)
Called [facility] and asked if patient was able to return to group home, spoke with Elease Hashimoto [facility]) and she stated that patient is able to return, but they will not be able to pick patient up until 615p today

## 2019-11-10 NOTE — ED Notes (Signed)
Patient continues to yell out and scream out, staff continues to redirect her

## 2019-11-10 NOTE — ED Notes (Signed)
Snack and drink provided. ?

## 2019-11-10 NOTE — ED Provider Notes (Signed)
Emergency Medicine Observation Re-evaluation Note  Carrie Clark is a 59 y.o. female, seen on rounds today.  Pt initially presented to the ED for complaints of mental health evaluation  Patient w/ hx of schizophrenia, here initially w/ concern for worsening psychiatric status/delusions  Currently, the patient is resting in bed, awake and alert, calm and cooperative. Eating breakfast. Has no complaints.   Physical Exam  BP 128/77 (BP Location: Left Arm)   Pulse (!) 109   Temp 98.5 F (36.9 C) (Oral)   Resp 16   Ht 5\' 4"  (1.626 m)   Wt 81.6 kg   SpO2 95%   BMI 30.90 kg/m  Physical Exam  General: Awake and alert, eating breakfast Head: [place]/AT Respiratory: Normal work of breathing Cardiac: Extremities well perfused MSK: No deformities Neurological: No focal neurological deficits Psych: Calm and cooperative   ED Course / MDM  I have reviewed the labs performed to date as well as medications administered while in observation.  No new labs in last 24 hours. On her home medications. Keflex started on initial arrival d/t UA concerning for UTI Plan  Current plan is for final psychiatric recommendations and disposition. Patient is under full IVC at this time.   ., MD 11/10/19 1009

## 2019-11-10 NOTE — ED Notes (Signed)
Spoke with patient sister Thelma Barge and informed her that patient is being discharged back to her group home

## 2019-11-10 NOTE — ED Notes (Signed)
Pt given lunch tray.

## 2019-11-10 NOTE — ED Notes (Signed)
VOL  PENDING  PLACEMENT 

## 2019-11-10 NOTE — ED Notes (Signed)
Called Group home to get estimate and pick up time. Per group someone is on the way.

## 2019-11-10 NOTE — BH Assessment (Signed)
TTS completed reassessment. Pt presented calm, pleasant and oriented x 3. Pt reports to be feeling good and complained of her legs hurting. Pt confirmed struggles with impulsivtity and sleeping. Pt discussed medications with the provider and agree to making adjustments. Pt denies any current SI/HI/AH/VH and contracted for safety.   Per Dr. Smith Robert pt will be discharged back to her [facility] pending medication adjustments

## 2019-11-10 NOTE — ED Notes (Signed)
Pt given breakfast tray and a soda.  °

## 2019-11-10 NOTE — ED Notes (Addendum)
Pt offered meal tray but refused and stated to this tech "I don't want that in my room."

## 2019-11-10 NOTE — ED Notes (Signed)
Pt given supplies to shower 

## 2019-11-10 NOTE — ED Notes (Signed)
Attempted to message psychiatrist about patient being discharged, per Dr. Smith Robert if the group home will take her back she will be discharged. Patient group home staff is on the way to pick her up

## 2019-11-12 ENCOUNTER — Other Ambulatory Visit: Payer: Self-pay

## 2019-11-12 ENCOUNTER — Encounter: Payer: Self-pay | Admitting: Emergency Medicine

## 2019-11-12 ENCOUNTER — Emergency Department
Admission: EM | Admit: 2019-11-12 | Discharge: 2019-11-17 | Disposition: A | Discharge status: Home / Self Care | Payer: Medicare Other | Attending: Emergency Medicine | Admitting: Emergency Medicine

## 2019-11-12 DIAGNOSIS — J449 Chronic obstructive pulmonary disease, unspecified: Secondary | ICD-10-CM | POA: Insufficient documentation

## 2019-11-12 DIAGNOSIS — F250 Schizoaffective disorder, bipolar type: Secondary | ICD-10-CM | POA: Diagnosis present

## 2019-11-12 DIAGNOSIS — I10 Essential (primary) hypertension: Secondary | ICD-10-CM | POA: Diagnosis not present

## 2019-11-12 DIAGNOSIS — R456 Violent behavior: Secondary | ICD-10-CM | POA: Diagnosis present

## 2019-11-12 DIAGNOSIS — Z79899 Other long term (current) drug therapy: Secondary | ICD-10-CM | POA: Diagnosis not present

## 2019-11-12 DIAGNOSIS — Z7984 Long term (current) use of oral hypoglycemic drugs: Secondary | ICD-10-CM | POA: Diagnosis not present

## 2019-11-12 DIAGNOSIS — E119 Type 2 diabetes mellitus without complications: Secondary | ICD-10-CM | POA: Insufficient documentation

## 2019-11-12 DIAGNOSIS — Z20822 Contact with and (suspected) exposure to covid-19: Secondary | ICD-10-CM | POA: Diagnosis not present

## 2019-11-12 DIAGNOSIS — F17210 Nicotine dependence, cigarettes, uncomplicated: Secondary | ICD-10-CM | POA: Insufficient documentation

## 2019-11-12 DIAGNOSIS — F259 Schizoaffective disorder, unspecified: Secondary | ICD-10-CM | POA: Insufficient documentation

## 2019-11-12 DIAGNOSIS — F319 Bipolar disorder, unspecified: Secondary | ICD-10-CM | POA: Insufficient documentation

## 2019-11-12 DIAGNOSIS — R4689 Other symptoms and signs involving appearance and behavior: Secondary | ICD-10-CM

## 2019-11-12 LAB — URINALYSIS, COMPLETE (UACMP) WITH MICROSCOPIC
Bilirubin Urine: NEGATIVE
Glucose, UA: >=500 mg/dL — AB
Hgb urine dipstick: NEGATIVE
Ketones, ur: NEGATIVE mg/dL
Leukocytes,Ua: NEGATIVE
Nitrite: NEGATIVE
Protein, ur: NEGATIVE mg/dL
Specific Gravity, Urine: 1.013 (ref 1.005–1.030)
pH: 5 (ref 5.0–8.0)

## 2019-11-12 LAB — ETHANOL: Alcohol, Ethyl (B): <10 mg/dL (ref <10)

## 2019-11-12 LAB — URINE DRUG SCREEN, QUALITATIVE (ARMC ONLY)
Amphetamines, Ur Screen: NOT DETECTED
Barbiturates, Ur Screen: NOT DETECTED
Benzodiazepine, Ur Scrn: NOT DETECTED
Cannabinoid 50 Ng, Ur ~~LOC~~: NOT DETECTED
Cocaine Metabolite,Ur ~~LOC~~: NOT DETECTED
MDMA (Ecstasy)Ur Screen: NOT DETECTED
Methadone Scn, Ur: NOT DETECTED
Opiate, Ur Screen: NOT DETECTED
Phencyclidine (PCP) Ur S: NOT DETECTED
Tricyclic, Ur Screen: NOT DETECTED

## 2019-11-12 LAB — CBC
HCT: 31.4 % — ABNORMAL LOW (ref 36.0–46.0)
Hemoglobin: 10.2 g/dL — ABNORMAL LOW (ref 12.0–15.0)
MCH: 28.7 pg (ref 26.0–34.0)
MCHC: 32.5 g/dL (ref 30.0–36.0)
MCV: 88.5 fL (ref 80.0–100.0)
Platelets: 332 10*3/uL (ref 150–400)
RBC: 3.55 MIL/uL — ABNORMAL LOW (ref 3.87–5.11)
RDW: 14.6 % (ref 11.5–15.5)
WBC: 10.1 10*3/uL (ref 4.0–10.5)
nRBC: 0 % (ref 0.0–0.2)

## 2019-11-12 LAB — COMPREHENSIVE METABOLIC PANEL
ALT: 15 U/L (ref 0–44)
AST: 19 U/L (ref 15–41)
Albumin: 3.5 g/dL (ref 3.5–5.0)
Alkaline Phosphatase: 94 U/L (ref 38–126)
Anion gap: 9 (ref 5–15)
BUN: 16 mg/dL (ref 6–20)
CO2: 28 mmol/L (ref 22–32)
Calcium: 9.3 mg/dL (ref 8.9–10.3)
Chloride: 98 mmol/L (ref 98–111)
Creatinine, Ser: 1.01 mg/dL — ABNORMAL HIGH (ref 0.44–1.00)
GFR calc Af Amer: >60 mL/min (ref >60)
GFR calc non Af Amer: >60 mL/min (ref >60)
Glucose, Bld: 216 mg/dL — ABNORMAL HIGH (ref 70–99)
Potassium: 4.7 mmol/L (ref 3.5–5.1)
Sodium: 135 mmol/L (ref 135–145)
Total Bilirubin: 0.5 mg/dL (ref 0.3–1.2)
Total Protein: 6.3 g/dL — ABNORMAL LOW (ref 6.5–8.1)

## 2019-11-12 LAB — SALICYLATE LEVEL: Salicylate Lvl: <7 mg/dL — ABNORMAL LOW (ref 7.0–30.0)

## 2019-11-12 LAB — POCT PREGNANCY, URINE: Preg Test, Ur: NEGATIVE

## 2019-11-12 LAB — TSH: TSH: 3.934 u[IU]/mL (ref 0.350–4.500)

## 2019-11-12 LAB — ACETAMINOPHEN LEVEL: Acetaminophen (Tylenol), Serum: <10 ug/mL — ABNORMAL LOW (ref 10–30)

## 2019-11-12 LAB — GLUCOSE, CAPILLARY: Glucose-Capillary: 439 mg/dL — ABNORMAL HIGH (ref 70–99)

## 2019-11-12 MED ORDER — ALBUTEROL SULFATE (2.5 MG/3ML) 0.083% IN NEBU
2.5000 mg | INHALATION_SOLUTION | RESPIRATORY_TRACT | Status: DC | PRN
  Filled 2019-11-12: qty 3

## 2019-11-12 MED ORDER — METFORMIN HCL 500 MG PO TABS
1000.0000 mg | ORAL_TABLET | Freq: Two times a day (BID) | ORAL | Status: DC
  Administered 2019-11-12 – 2019-11-17 (×10): 1000 mg via ORAL
  Filled 2019-11-12 (×10): qty 2

## 2019-11-12 MED ORDER — BENZTROPINE MESYLATE 1 MG PO TABS
1.0000 mg | ORAL_TABLET | Freq: Two times a day (BID) | ORAL | Status: DC
  Administered 2019-11-12 – 2019-11-15 (×6): 1 mg via ORAL
  Filled 2019-11-12 (×6): qty 1

## 2019-11-12 MED ORDER — ATORVASTATIN CALCIUM 20 MG PO TABS
40.0000 mg | ORAL_TABLET | Freq: Every day | ORAL | Status: DC
  Administered 2019-11-12 – 2019-11-16 (×5): 40 mg via ORAL
  Filled 2019-11-12 (×5): qty 2

## 2019-11-12 MED ORDER — LORAZEPAM 1 MG PO TABS
1.0000 mg | ORAL_TABLET | Freq: Two times a day (BID) | ORAL | Status: DC | PRN
  Administered 2019-11-12 – 2019-11-17 (×5): 1 mg via ORAL
  Filled 2019-11-12 (×5): qty 1

## 2019-11-12 MED ORDER — FAMOTIDINE 20 MG PO TABS
20.0000 mg | ORAL_TABLET | Freq: Two times a day (BID) | ORAL | Status: DC
  Administered 2019-11-12 – 2019-11-17 (×10): 20 mg via ORAL
  Filled 2019-11-12 (×10): qty 1

## 2019-11-12 MED ORDER — LEVOTHYROXINE SODIUM 75 MCG PO TABS
100.0000 ug | ORAL_TABLET | Freq: Every day | ORAL | Status: DC
  Administered 2019-11-13 – 2019-11-17 (×5): 100 ug via ORAL
  Filled 2019-11-12: qty 2
  Filled 2019-11-12 (×4): qty 1
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
## 2019-11-12 NOTE — ED Notes (Signed)
Pt asked loudly from room 21 where the restroom is. Pt then came out of restroom loudly stating "this isn't a nursing home this isn't a hospital" and then proceeded to ask where her clothes were and that she wanted to go home. Pt started walking down the hall in the quad and made it all the way to rm 12 at approximately 1730 before the staff could calm the pt down enough to walk her back to rm 21.

## 2019-11-12 NOTE — ED Notes (Signed)
Pt given a sandwich tray and a sprite with ice, no lid or straw given.

## 2019-11-12 NOTE — ED Notes (Signed)
Patient given ED meal tray.

## 2019-11-12 NOTE — ED Triage Notes (Signed)
Pt to ED via [facility] from group home for evaluation of pain on the left side from her ankle to her kidney. Pt is here for psych evaluation for aggressive behavior. Pt upset and crying in triage. Pt denies falling

## 2019-11-12 NOTE — ED Notes (Signed)
Report given to Noel RN 

## 2019-11-12 NOTE — ED Notes (Signed)
Pt changed into behavorial clothing by this tech and RN Marylene Land, pt belongings obtained and placed in labeled belongings bag and handed off to [employer]. The following items obtained are.......Marland Kitchen  1 pair of navy blue, light blue and pink fleece pajama pants,  1 cheetah print button up shirt,  1 brown tank top, 1 pair of black boots  Pt has blue brief on that she wears at home, this tech gave permission to pt to continue to wear brief

## 2019-11-12 NOTE — ED Notes (Signed)
Patient walked to the end of the hallway and tried to go into a patient's room. Security Dewaine Conger was with patient. Charge nurse Ashok Cordia was also present. Patient agreed to go back to the room with Dr. Roxan Hockey. Dr. Fanny Bien aware.

## 2019-11-12 NOTE — ED Notes (Signed)
Patient was given a warm blanket per request.  

## 2019-11-13 ENCOUNTER — Emergency Department: Payer: Medicare Other

## 2019-11-13 DIAGNOSIS — F250 Schizoaffective disorder, bipolar type: Secondary | ICD-10-CM

## 2019-11-13 DIAGNOSIS — F259 Schizoaffective disorder, unspecified: Secondary | ICD-10-CM | POA: Diagnosis not present

## 2019-11-13 LAB — GLUCOSE, CAPILLARY
Glucose-Capillary: 187 mg/dL — ABNORMAL HIGH (ref 70–99)
Glucose-Capillary: 251 mg/dL — ABNORMAL HIGH (ref 70–99)
Glucose-Capillary: 217 mg/dL — ABNORMAL HIGH (ref 70–99)

## 2019-11-13 LAB — HEMOGLOBIN A1C
Hgb A1c MFr Bld: 10.3 % — ABNORMAL HIGH (ref 4.8–5.6)
Mean Plasma Glucose: 248.91 mg/dL

## 2019-11-13 LAB — SARS CORONAVIRUS 2 BY RT PCR (HOSPITAL ORDER, PERFORMED IN ~~LOC~~ HOSPITAL LAB): SARS Coronavirus 2: NEGATIVE

## 2019-11-13 MED ORDER — ACETAMINOPHEN 500 MG PO TABS
1000.0000 mg | ORAL_TABLET | Freq: Once | ORAL | Status: AC
  Administered 2019-11-13: 1000 mg via ORAL
  Filled 2019-11-13: qty 2

## 2019-11-13 MED ORDER — INSULIN ASPART 100 UNIT/ML ~~LOC~~ SOLN
0.0000 [IU] | Freq: Three times a day (TID) | SUBCUTANEOUS | Status: DC
  Administered 2019-11-13: 5 [IU] via SUBCUTANEOUS
  Administered 2019-11-13 – 2019-11-14 (×2): 3 [IU] via SUBCUTANEOUS
  Administered 2019-11-14: 5 [IU] via SUBCUTANEOUS
  Administered 2019-11-16: 1 [IU] via SUBCUTANEOUS
  Administered 2019-11-16 – 2019-11-17 (×2): 2 [IU] via SUBCUTANEOUS
  Filled 2019-11-13 (×8): qty 1

## 2019-11-13 NOTE — Progress Notes (Signed)
Inpatient Diabetes Program Recommendations  AACE/ADA: New Consensus Statement on Inpatient Glycemic Control (2015)  Target Ranges:  Prepandial:   less than 140 mg/dL      Peak postprandial:   less than 180 mg/dL (1-2 hours)      Critically ill patients:  140 - 180 mg/dL   Lab Results  Component Value Date   GLUCAP 187 (H) 11/13/2019   HGBA1C 10.3 (H) 11/12/2019    Review of Glycemic Control Results for Carrie Clark, Carrie Clark (MRN [id number]) as of 11/13/2019 09:24  Ref. Range 11/12/2019 23:31 11/13/2019 08:36  Glucose-Capillary Latest Ref Range: 70 - 99 mg/dL 949 (H) 971 (H)   Diabetes history: Type 2 DM Outpatient Diabetes medications: Levemir 20 units BID, Metformin 1000 mg BID Current orders for Inpatient glycemic control: Novolog 0-9 units TID, Metformin 1000 mg BID  Inpatient Diabetes Program Recommendations:    Noted consult. Awaiting inpt psych.  Consider adding Levemir 10 units BID.  If appropriate, may want to consider carb modified diet.   Thanks, Lujean Rave, MSN, RNC-OB Diabetes Coordinator [phone] (8a-5p)

## 2019-11-13 NOTE — ED Notes (Signed)
Pt reports pain in both lower legs, reports that "it just started" and feels "achy" 9/10  Pt doesn't reflect anymore pain with palpation  EDP notified  Pt reports to EDP legs have been hurting a few days and then "for months"

## 2019-11-13 NOTE — ED Notes (Signed)
DG complete att; this RN at bedside because pt became verbally aggressive and abusive to [place], [place] tech, difficulty orienting pt to movement for the legs necessary for the xray films

## 2019-11-13 NOTE — ED Provider Notes (Signed)
Emergency Medicine Observation Re-evaluation Note  Carrie Clark is a 59 y.o. female, seen on rounds today.  Pt initially presented to the ED for complaints of Knee Pain, Flank Pain, and Psychiatric Evaluation Currently, the patient is complaining of bilateral shin pain which she has had for the last several weeks.  The pain is throbbing and constant.  She denies any prior history of DVT.Marland Kitchen  Physical Exam  BP 128/60 (BP Location: Right Arm)   Pulse 68   Temp 98.5 F (36.9 C) (Oral)   Resp 19   Ht 5\' 3"  (1.6 m)   Wt 77.1 kg   SpO2 98%   BMI 30.11 kg/m  Physical Exam   General: No apparent distress HEENT: moist mucous membranes CV: RRR Pulm: Normal WOB GI: soft and non tender MSK: no edema or cyanosis.  Patient is tender to palpation on bilateral shin with no edema, erythema, warmth, swelling, or bony abnormalities. Neuro: face symmetric, moving all extremities    ED Course / MDM   I have reviewed the labs performed to date as well as medications administered while in observation.  Bilateral leg x-ray showing no abnormalities.  Bilateral venous Doppler showing no signs of DVT. With normal exam and negative imaging this is most likely neuropathy on a patient with a history of diabetes.  Patient treated with p.o. Tylenol. Plan  Current plan is for psychiatric placement. Patient is under full IVC at this time.   , Don Perking, MD 11/13/19 [phone]

## 2019-11-13 NOTE — ED Notes (Addendum)
Pt watching tv and yelling at it. Says "I hate you" repeatedly. Pt door is closed.

## 2019-11-13 NOTE — Consult Note (Signed)
[facility] Face-to-Face Psychiatry Consult   Reason for Consult:  Psych evaluation  Referring Physician:  Dr.Quale Patient Identification: Carrie Clark MRN:  [id number] Principal Diagnosis: Schizoaffective disorder, bipolar type (HCC) Diagnosis:  Principal Problem:   Schizoaffective disorder, bipolar type (HCC)   Total Time spent with patient: 45 minutes  Subjective:   Carrie Clark is a 59 y.o. female patient admitted with per triage nurse, pt to ED via [facility] from group home for evaluation of pain on the left side from her ankle to her kidney. Pt is here for psych evaluation for aggressive behavior. Pt upset and crying in triage. Pt denies falling.  HPI:  Assessment  Carrie Clark, 59 y.o., female patient presented to .  Patient seen via telepsych by TTS and this provider; chart reviewed and consulted with Dr. Lucianne Muss on 11/13/19.  On evaluation Carrie Clark reports per tts, Rodney Wigger is an 59 y.o. female presenting [facility] ED via EMS from her group home. Per triage note Pt is here for psych evaluation for aggressive behavior. Pt upset and crying in triage. Pt denies falling. Per IVC patient has been grabbing knives, has been agitated and has been charging at residents with knives and spitting on the residents. During the assessment patient was alert alert and oriented to her who she was but was unaware of where she is or the situation, patient reports she believes she is here "because I was having a pain in my hip and knee and I need help with it." When asked about her medications patient reported "I take mediations, you can get that information from my doctor." Patient was able to endorse that she has AH but was not able to report what she gathers from her AH. Patient denies SI/HI/VH.  During evaluation Carrie Clark is talking loudly ; she is alert/oriented x2; yelling/delusional; and mood congruent with affect.  Patient is speaking in a muffledd tone at loud  volume, and normal pace; with poor  eye contact.  Her thought process is incoherent and irrelevant; There is indication that she is currently responding to internal/external stimuli or experiencing delusional thought content.  Patient denies suicidal/self-harm and homicidal ideation. Patient is actively psychotic  and paranoid.    Past Psychiatric History: schizophrenia   Risk to Self: Suicidal Ideation: No Suicidal Intent: No Is patient at risk for suicide?: No Suicidal Plan?: No Access to Means: No What has been your use of drugs/alcohol within the last 12 months?: None How many times?: 0 Other Self Harm Risks: None Triggers for Past Attempts: None known Intentional Self Injurious Behavior: None Risk to Others: Homicidal Ideation: No Thoughts of Harm to Others: No Current Homicidal Intent: No Current Homicidal Plan: No Access to Homicidal Means: No Identified Victim: None reported History of harm to others?: No Assessment of Violence: None Noted Violent Behavior Description: Patient has been spitting on residents and grabbings knives Does patient have access to weapons?: No Criminal Charges Pending?: No Does patient have a court date: No Prior Inpatient Therapy: Prior Inpatient Therapy:  (Unknown) Prior Outpatient Therapy: Prior Outpatient Therapy:  (Unknown)  Past Medical History:  Past Medical History:  Diagnosis Date  . COPD (chronic obstructive pulmonary disease) (HCC)   . CVA (cerebral infarction)   . Depression   . Hypertension   . Hypothyroidism   . Schizoaffective disorder, bipolar type (HCC)   . Schizophrenia (HCC)   . Stroke (HCC)   . Tardive dyskinesia   . Tardive dyskinesia     Past Surgical History:  Procedure Laterality Date  . NO PAST SURGERIES     Family History:  Family History  Family history unknown: Yes   Family Psychiatric  History: unknown Social History:  Social History   Substance and Sexual Activity  Alcohol Use No     Social History   Substance and Sexual Activity   Drug Use No    Social History   Socioeconomic History  . Marital status: Single    Spouse name: Not on file  . Number of children: Not on file  . Years of education: Not on file  . Highest education level: Not on file  Occupational History  . Not on file  Tobacco Use  . Smoking status: Current Every Day Smoker    Packs/day: 1.00    Types: Cigarettes  . Smokeless tobacco: Never Used  Vaping Use  . Vaping Use: Never used  Substance and Sexual Activity  . Alcohol use: No  . Drug use: No  . Sexual activity: Not on file  Other Topics Concern  . Not on file  Social History Narrative   Lives at [facility]   Social Determinants of Health   Financial Resource Strain: Low Risk   . Difficulty of Paying Living Expenses: Not hard at all  Food Insecurity: No Food Insecurity  . Worried About Programme researcher, broadcasting/film/video in the Last Year: Never true  . Ran Out of Food in the Last Year: Never true  Transportation Needs: No Transportation Needs  . Lack of Transportation (Medical): No  . Lack of Transportation (Non-Medical): No  Physical Activity: Inactive  . Days of Exercise per Week: 0 days  . Minutes of Exercise per Session: 0 min  Stress: No Stress Concern Present  . Feeling of Stress : Not at all  Social Connections: Socially Isolated  . Frequency of Communication with Friends and Family: Once a week  . Frequency of Social Gatherings with Friends and Family: Once a week  . Attends Religious Services: Never  . Active Member of Clubs or Organizations: No  . Attends Banker Meetings: Never  . Marital Status: Never married   Additional Social History:    Allergies:   Allergies  Allergen Reactions  . Fish Allergy Other (See Comments)    Allergy reaction not listed- on MAR    Labs:  Results for orders placed or performed during the hospital encounter of 11/12/19 (from the past 48 hour(s))  Comprehensive metabolic panel     Status: Abnormal    Collection Time: 11/12/19  3:14 PM  Result Value Ref Range   Sodium 135 135 - 145 mmol/L   Potassium 4.7 3.5 - 5.1 mmol/L   Chloride 98 98 - 111 mmol/L   CO2 28 22 - 32 mmol/L   Glucose, Bld 216 (H) 70 - 99 mg/dL    Comment: Glucose reference range applies only to samples taken after fasting for at least 8 hours.   BUN 16 6 - 20 mg/dL   Creatinine, Ser 7.11 (H) 0.44 - 1.00 mg/dL   Calcium 9.3 8.9 - 65.7 mg/dL   Total Protein 6.3 (L) 6.5 - 8.1 g/dL   Albumin 3.5 3.5 - 5.0 g/dL   AST 19 15 - 41 U/L   ALT 15 0 - 44 U/L   Alkaline Phosphatase 94 38 - 126 U/L   Total Bilirubin 0.5 0.3 - 1.2 mg/dL   GFR calc non Af Amer >60 >60 mL/min   GFR calc Af Amer >60 >60  mL/min   Anion gap 9 5 - 15    Comment: Performed at [facility], [address] [place], [place] [address]  Ethanol     Status: None   Collection Time: 11/12/19  3:14 PM  Result Value Ref Range   Alcohol, Ethyl (B) <10 <10 mg/dL    Comment: (NOTE) Lowest detectable limit for serum alcohol is 10 mg/dL.  For medical purposes only. Performed at [facility], [address] [place], [place] [address]   Salicylate level     Status: Abnormal   Collection Time: 11/12/19  3:14 PM  Result Value Ref Range   Salicylate Lvl <7.0 (L) 7.0 - 30.0 mg/dL    Comment: Performed at [facility], [address] [place], [place] [address]  Acetaminophen level     Status: Abnormal   Collection Time: 11/12/19  3:14 PM  Result Value Ref Range   Acetaminophen (Tylenol), Serum <10 (L) 10 - 30 ug/mL    Comment: (NOTE) Therapeutic concentrations vary significantly. A range of 10-30 ug/mL  may be an effective concentration for many patients. However, some  are best treated at concentrations outside of this range. Acetaminophen concentrations >150 ug/mL at 4 hours after ingestion  and >50 ug/mL at 12 hours after ingestion are often associated with  toxic reactions.  Performed at [facility], [address] [place], [place] [address]   cbc     Status: Abnormal   Collection Time: 11/12/19  3:14 PM  Result Value Ref Range   WBC 10.1 4.0 - 10.5 K/uL   RBC 3.55 (L) 3.87 - 5.11 MIL/uL   Hemoglobin 10.2 (L) 12.0 - 15.0 g/dL   HCT 29.5 (L) 36 - 46 %   MCV 88.5 80.0 - 100.0 fL   MCH 28.7 26.0 - 34.0 pg   MCHC 32.5 30.0 - 36.0 g/dL   RDW 62.1 30.8 - 65.7 %   Platelets 332 150 - 400 K/uL   nRBC 0.0 0.0 - 0.2 %    Comment: Performed at [facility], [address] [place], [place] [address]  TSH     Status: None   Collection Time: 11/12/19  3:14 PM  Result Value Ref Range   TSH 3.934 0.350 - 4.500 uIU/mL    Comment: Performed by a 3rd Generation assay with a functional sensitivity of <=0.01 uIU/mL. Performed at [facility], [address] [place], [place] [address]   Urine Drug Screen, Qualitative     Status: None   Collection Time: 11/12/19  9:04 PM  Result Value Ref Range   Tricyclic, Ur Screen NONE DETECTED NONE DETECTED   Amphetamines, Ur Screen NONE DETECTED NONE DETECTED   MDMA (Ecstasy)Ur Screen NONE DETECTED NONE DETECTED   Cocaine Metabolite,Ur [place] NONE DETECTED NONE DETECTED   Opiate, Ur Screen NONE DETECTED NONE DETECTED   Phencyclidine (PCP) Ur S NONE DETECTED NONE DETECTED   Cannabinoid 50 Ng, Ur [place] NONE DETECTED NONE DETECTED   Barbiturates, Ur Screen NONE DETECTED NONE DETECTED   Benzodiazepine, Ur Scrn NONE DETECTED NONE DETECTED   Methadone Scn, Ur NONE DETECTED NONE DETECTED    Comment: (NOTE) Tricyclics + metabolites, urine    Cutoff 1000 ng/mL Amphetamines + metabolites, urine  Cutoff 1000 ng/mL MDMA (Ecstasy), urine              Cutoff 500 ng/mL Cocaine Metabolite, urine          Cutoff 300 ng/mL Opiate + metabolites, urine  Cutoff 300 ng/mL Phencyclidine (PCP), urine         Cutoff 25 ng/mL Cannabinoid, urine                 Cutoff 50 ng/mL Barbiturates + metabolites, urine  Cutoff 200 ng/mL Benzodiazepine, urine               Cutoff 200 ng/mL Methadone, urine                   Cutoff 300 ng/mL  The urine drug screen provides only a preliminary, unconfirmed analytical test result and should not be used for non-medical purposes. Clinical consideration and professional judgment should be applied to any positive drug screen result due to possible interfering substances. A more specific alternate chemical method must be used in order to obtain a confirmed analytical result. Gas chromatography / mass spectrometry (GC/MS) is the preferred confirm atory method. Performed at [facility], [address] [place], [place] [address]   Urinalysis, Complete w Microscopic     Status: Abnormal   Collection Time: 11/12/19  9:04 PM  Result Value Ref Range   Color, Urine YELLOW (A) YELLOW   APPearance CLEAR (A) CLEAR   Specific Gravity, Urine 1.013 1.005 - 1.030   pH 5.0 5.0 - 8.0   Glucose, UA >=500 (A) NEGATIVE mg/dL   Hgb urine dipstick NEGATIVE NEGATIVE   Bilirubin Urine NEGATIVE NEGATIVE   Ketones, ur NEGATIVE NEGATIVE mg/dL   Protein, ur NEGATIVE NEGATIVE mg/dL   Nitrite NEGATIVE NEGATIVE   Leukocytes,Ua NEGATIVE NEGATIVE   RBC / HPF 0-5 0 - 5 RBC/hpf   WBC, UA 0-5 0 - 5 WBC/hpf   Bacteria, UA RARE (A) NONE SEEN   Squamous Epithelial / LPF 0-5 0 - 5   Mucus PRESENT     Comment: Performed at [facility], [address] [place], [place] [address]  Pregnancy, urine POC     Status: None   Collection Time: 11/12/19 10:17 PM  Result Value Ref Range   Preg Test, Ur NEGATIVE NEGATIVE    Comment:        THE SENSITIVITY OF THIS METHODOLOGY IS >24 mIU/mL   SARS Coronavirus 2 by RT PCR (hospital order, performed in [facility] hospital lab) Nasopharyngeal Nasopharyngeal Swab     Status: None   Collection Time: 11/12/19 10:45 PM   Specimen: Nasopharyngeal Swab  Result Value Ref Range   SARS Coronavirus 2 NEGATIVE NEGATIVE    Comment: (NOTE) SARS-CoV-2 target nucleic acids are NOT  DETECTED.  The SARS-CoV-2 RNA is generally detectable in upper and lower respiratory specimens during the acute phase of infection. The lowest concentration of SARS-CoV-2 viral copies this assay can detect is 250 copies / mL. A negative result does not preclude SARS-CoV-2 infection and should not be used as the sole basis for treatment or other patient management decisions.  A negative result may occur with improper specimen collection / handling, submission of specimen other than nasopharyngeal swab, presence of viral mutation(s) within the areas targeted by this assay, and inadequate number of viral copies (<250 copies / mL). A negative result must be combined with clinical observations, patient history, and epidemiological information.  Fact Sheet for Patients:   BoilerBrush.com.cyhttps://www.fda.gov/media/136312/download  Fact Sheet for Healthcare Providers: https://pope.com/https://www.fda.gov/media/136313/download  This test is not yet approved or  cleared by the [place] FDA and has been authorized for detection and/or diagnosis of SARS-CoV-2 by FDA under an Emergency Use Authorization (EUA).  This EUA  will remain in effect (meaning this test can be used) for the duration of the COVID-19 declaration under Section 564(b)(1) of the Act, 21 U.S.C. section 360bbb-3(b)(1), unless the authorization is terminated or revoked sooner.  Performed at [facility], [address] [place],  [address]   Glucose, capillary     Status: Abnormal   Collection Time: 11/12/19 11:31 PM  Result Value Ref Range   Glucose-Capillary 439 (H) 70 - 99 mg/dL    Comment: Glucose reference range applies only to samples taken after fasting for at least 8 hours.    Current Facility-Administered Medications  Medication Dose Route Frequency Provider Last Rate Last Admin  . albuterol (PROVENTIL) (2.5 MG/3ML) 0.083% nebulizer solution 2.5 mg  2.5 mg Inhalation Q4H PRN Delman Kitten, MD      . atorvastatin (LIPITOR)  tablet 40 mg  40 mg Oral QHS Delman Kitten, MD   40 mg at 11/12/19 2242  . benztropine (COGENTIN) tablet 1 mg  1 mg Oral BID Delman Kitten, MD   1 mg at 11/12/19 2241  . famotidine (PEPCID) tablet 20 mg  20 mg Oral BID Delman Kitten, MD   20 mg at 11/12/19 2242  . insulin aspart (novoLOG) injection 0-9 Units  0-9 Units Subcutaneous TID WC Delman Kitten, MD      . levothyroxine (SYNTHROID) tablet 100 mcg  100 mcg Oral QAC breakfast Delman Kitten, MD      . LORazepam (ATIVAN) tablet 1 mg  1 mg Oral BID PRN Delman Kitten, MD   1 mg at 11/12/19 2242  . metFORMIN (GLUCOPHAGE) tablet 1,000 mg  1,000 mg Oral BID Delman Kitten, MD   1,000 mg at 11/12/19 2242   Current Outpatient Medications  Medication Sig Dispense Refill  . acetaminophen (TYLENOL) 325 MG tablet Take 2 tablets (650 mg total) by mouth every 6 (six) hours as needed for moderate pain. 60 tablet 0  . albuterol (PROVENTIL HFA;VENTOLIN HFA) 108 (90 Base) MCG/ACT inhaler Inhale 2 puffs into the lungs every 6 (six) hours as needed for wheezing or shortness of breath. (Patient taking differently: Inhale 2 puffs into the lungs every 4 (four) hours as needed for wheezing or shortness of breath. ) 1 Inhaler 0  . atorvastatin (LIPITOR) 40 MG tablet Take 40 mg by mouth at bedtime.     . benztropine (COGENTIN) 1 MG tablet Take 1 mg by mouth 2 (two) times daily.     . cetirizine (ZYRTEC) 10 MG tablet Take 1 tablet (10 mg total) by mouth daily.    . Deutetrabenazine (AUSTEDO) 12 MG TABS Take 12 mg by mouth in the morning and at bedtime.    . donepezil (ARICEPT) 5 MG tablet Take 5 mg by mouth at bedtime.    . famotidine (PEPCID) 20 MG tablet Take 1 tablet (20 mg total) by mouth 2 (two) times daily.    . fluPHENAZine (PROLIXIN) 10 MG tablet Take 1 tablet (10 mg total) by mouth 3 (three) times daily. 90 tablet 0  . fluPHENAZine decanoate (PROLIXIN) 25 MG/ML injection Inject 50 mg into the muscle every 28 (twenty-eight) days.    . furosemide (LASIX) 20 MG tablet Take 1  tablet (20 mg total) by mouth daily. 30 tablet   . guaiFENesin (ROBITUSSIN) 100 MG/5ML SOLN Take 10 mLs by mouth every 4 (four) hours as needed for cough or to loosen phlegm.    . Insulin Detemir (LEVEMIR) 100 UNIT/ML Pen Inject 20 Units into the skin 2 (two) times daily.     Marland Kitchen  levothyroxine (SYNTHROID) 100 MCG tablet Take 100 mcg by mouth daily before breakfast.     . lisinopril (PRINIVIL,ZESTRIL) 5 MG tablet Take 5 mg by mouth daily.    Marland Kitchen LORazepam (ATIVAN) 1 MG tablet Take 1 tablet (1 mg total) by mouth 2 (two) times daily as needed for anxiety (agitation).    Marland Kitchen lurasidone (LATUDA) 20 MG TABS tablet Take 20 mg by mouth daily in the afternoon.     . metFORMIN (GLUCOPHAGE) 1000 MG tablet Take 1 tablet (1,000 mg total) by mouth 2 (two) times daily.    . mirtazapine (REMERON) 15 MG tablet Take 15 mg by mouth at bedtime.    . Multiple Vitamins-Minerals (MULTIVITAMIN WITH MINERALS) tablet Take 1 tablet by mouth daily.    . Oxcarbazepine (TRILEPTAL) 300 MG tablet Take 1 tablet (300 mg total) by mouth 2 (two) times daily. 60 tablet 0  . potassium chloride (K-DUR,KLOR-CON) 10 MEQ tablet Take 1 tablet (10 mEq total) by mouth daily.    . prazosin (MINIPRESS) 1 MG capsule Take 1 mg by mouth at bedtime.    Marland Kitchen tiotropium (SPIRIVA) 18 MCG inhalation capsule Place 18 mcg into inhaler and inhale daily.    Marland Kitchen zolpidem (AMBIEN) 5 MG tablet Take 5 mg by mouth at bedtime.      Musculoskeletal: Strength & Muscle Tone: within normal limits Gait & Station: normal Patient leans: N/A  Psychiatric Specialty Exam: Physical Exam  Nursing note and vitals reviewed. Constitutional: She is oriented to person, place, and time.  HENT:  Head: Normocephalic.  Respiratory: Effort normal.  Musculoskeletal:        General: Normal range of motion.     Cervical back: Normal range of motion.  Neurological: She is alert and oriented to person, place, and time.  Skin: Skin is warm and dry.  Psychiatric: Her mood appears  anxious. Her affect is inappropriate. Her speech is tangential. She is hyperactive. She expresses impulsivity and inappropriate judgment. She is inattentive.    Review of Systems  Blood pressure 119/63, pulse 73, temperature 98.5 F (36.9 C), temperature source Oral, resp. rate 18, height 5\' 3"  (1.6 m), weight 77.1 kg, SpO2 98 %.Body mass index is 30.11 kg/m.  General Appearance: Disheveled  Eye Contact:  Fair  Speech:  Garbled  Volume:  Increased  Mood:  Anxious, Dysphoric and Irritable  Affect:  Inappropriate  Thought Process:  Disorganized and Descriptions of Associations: Tangential  Orientation:  Full (Time, Place, and Person)  Thought Content:  Illogical  Suicidal Thoughts:  No  Homicidal Thoughts:  No  Memory:  Immediate;   Fair  Judgement:  Impaired  Insight:  Lacking  Psychomotor Activity:  Normal  Concentration:  Attention Span: Poor  Recall:  NA  Fund of Knowledge:  NA  Language:  Fair  Akathisia:  NA  Handed:  Right  AIMS (if indicated):     Assets:  Resilience Social Support  ADL's:  Intact  Cognition:  Impaired,  Moderate  Sleep:        Treatment Plan Summary: Daily contact with patient to assess and evaluate symptoms and progress in treatment and Medication management  Disposition: No evidence of imminent risk to self or others at present.   Patient does not meet criteria for psychiatric inpatient admission.  , NP 11/13/2019 2:05 AM

## 2019-11-13 NOTE — BH Assessment (Signed)
Referral information for Psychiatric Hospitalization faxed to;   Marland [facility] [phone]),   . [facility] ([phone]),  . [facility] [phone]),    . [facility] [phone] -or- [phone]),   . [facility] [phone])   [facility] [phone])

## 2019-11-13 NOTE — ED Notes (Signed)
Moved to [facility]

## 2019-11-13 NOTE — ED Notes (Signed)
DG at bedside. 

## 2019-11-13 NOTE — BH Assessment (Addendum)
Assessment Note  Carrie Clark is an 59 y.o. female presenting to [facility] ED via EMS from her group home. Per triage note Pt is here for psych evaluation for aggressive behavior. Pt upset and crying in triage. Pt denies falling. Per IVC patient has been grabbing knives, has been agitated and has been charging at residents with knives and spitting on the residents. During the assessment patient was alert alert and oriented to her who she was but was unaware of where she is or the situation, patient reports she believes she is here "because I was having a pain in my hip and knee and I need help with it." When asked about her medications patient reported "I take mediations, you can get that information from my doctor." Patient was able to endorse that she has AH but was not able to report what she gathers from her [place]. Patient denies SI/HI/VH.  Per Psyc NP patient is recommended for Inpatient Hospitalization, attempt was made to contact patient's legal guardian regarding recommendation Carrie Clark [phone] but no answer and no voicemail set up.  Diagnosis: Schizoaffective Disorder by history  Past Medical History:  Past Medical History:  Diagnosis Date   COPD (chronic obstructive pulmonary disease) (HCC)    CVA (cerebral infarction)    Depression    Hypertension    Hypothyroidism    Schizoaffective disorder, bipolar type ([place])    Schizophrenia (HCC)    Stroke ([place])    Tardive dyskinesia    Tardive dyskinesia     Past Surgical History:  Procedure Laterality Date   NO PAST SURGERIES      Family History:  Family History  Family history unknown: Yes    Social History:  reports that she has been smoking cigarettes. She has been smoking about 1.00 pack per day. She has never used smokeless tobacco. She reports that she does not drink alcohol and does not use drugs.  Additional Social History:  Alcohol / Drug Use Pain Medications: See MAR Prescriptions: See MAR Over the  Counter: See MAR History of alcohol / drug use?: No history of alcohol / drug abuse  CIWA: CIWA-Ar BP: 119/63 Pulse Rate: 73 COWS:    Allergies:  Allergies  Allergen Reactions   Fish Allergy Other (See Comments)    Allergy reaction not listed- on MAR    Home Medications: (Not in a hospital admission)   OB/GYN Status:  No LMP recorded. Patient is postmenopausal.  General Assessment Data Location of Assessment: [facility] ED TTS Assessment: In system Is this a Tele or Face-to-Face Assessment?: Face-to-Face Is this an Initial Assessment or a Re-assessment for this encounter?: Initial Assessment Patient Accompanied by:: N/A Language Other than English: No Living Arrangements: In Group Home: (Comment: Name of [facility]) What gender do you identify as?: Female Marital status: Single Living Arrangements: Other (Comment) ([facility]) Can pt return to current living arrangement?: Yes Admission Status: Involuntary Petitioner: ED Attending Is patient capable of signing voluntary admission?: No Referral Source: Other Insurance type: Medicare  Medical Screening Exam ([place]) Medical Exam completed: Yes  Crisis Care Plan Living Arrangements: Other (Comment) (Group Home) Legal Guardian: Other: (Carrie Clark) Name of Psychiatrist: Unknown Name of Therapist: Unknown  Education Status Is patient currently in school?: No Is the patient employed, unemployed or receiving disability?: Receiving disability income  Risk to self with the past 6 months Suicidal Ideation: No Has patient been a risk to self within the past 6 months prior to admission? : No Suicidal Intent:  No Has patient had any suicidal intent within the past 6 months prior to admission? : No Is patient at risk for suicide?: No Suicidal Plan?: No Has patient had any suicidal plan within the past 6 months prior to admission? : No Access to Means: No What has been your use of drugs/alcohol within the last 12  months?: None Previous Attempts/Gestures: No How many times?: 0 Other Self Harm Risks: None Triggers for Past Attempts: None known Intentional Self Injurious Behavior: None Family Suicide History: Unknown Recent stressful life event(s): Conflict (Comment) (Conflict with living facility) Persecutory voices/beliefs?: Yes Depression: No Substance abuse history and/or treatment for substance abuse?: No Suicide prevention information given to non-admitted patients: Not applicable  Risk to Others within the past 6 months Homicidal Ideation: No Does patient have any lifetime risk of violence toward others beyond the six months prior to admission? : No Thoughts of Harm to Others: No Current Homicidal Intent: No Current Homicidal Plan: No Access to Homicidal Means: No Identified Victim: None reported History of harm to others?: No Assessment of Violence: None Noted Violent Behavior Description: Patient has been spitting on residents and grabbings knives Does patient have access to weapons?: No Criminal Charges Pending?: No Does patient have a court date: No Is patient on probation?: No  Psychosis Hallucinations: Auditory Delusions: None noted  Mental Status Report Appearance/Hygiene: In scrubs Eye Contact: Fair Motor Activity: Freedom of movement Speech: Logical/coherent Level of Consciousness: Alert Mood: Irritable Affect: Flat Anxiety Level: Minimal Thought Processes: Coherent Judgement: Unimpaired Orientation: Person, Place, Time, Situation, Appropriate for developmental age Obsessive Compulsive Thoughts/Behaviors: None  Cognitive Functioning Concentration: Normal Memory: Recent Intact, Remote Intact Is patient IDD: No Insight: Fair Impulse Control: Poor Appetite: Fair Have you had any weight changes? : No Change Sleep: Decreased Total Hours of Sleep: 0  ADLScreening [facility]) Patient's cognitive ability adequate to safely complete daily  activities?: Yes Patient able to express need for assistance with ADLs?: Yes Independently performs ADLs?: Yes (appropriate for developmental age)  Prior Inpatient Therapy Prior Inpatient Therapy:  (Unknown)  Prior Outpatient Therapy Prior Outpatient Therapy:  (Unknown)  ADL Screening (condition at time of admission) Patient's cognitive ability adequate to safely complete daily activities?: Yes Is the patient deaf or have difficulty hearing?: No Does the patient have difficulty seeing, even when wearing glasses/contacts?: No Does the patient have difficulty concentrating, remembering, or making decisions?: No Patient able to express need for assistance with ADLs?: Yes Does the patient have difficulty dressing or bathing?: No Independently performs ADLs?: Yes (appropriate for developmental age) Does the patient have difficulty walking or climbing stairs?: No Weakness of Legs: None Weakness of Arms/Hands: None  Home Assistive Devices/Equipment Home Assistive Devices/Equipment: None  Therapy Consults (therapy consults require a physician order) PT Evaluation Needed: No OT Evalulation Needed: No SLP Evaluation Needed: No Abuse/Neglect Assessment (Assessment to be complete while patient is alone) Abuse/Neglect Assessment Can Be Completed: Yes Physical Abuse: Denies Verbal Abuse: Denies Sexual Abuse: Denies Exploitation of patient/patient's resources: Denies Self-Neglect: Denies Values / Beliefs Cultural Requests During Hospitalization: None Spiritual Requests During Hospitalization: None Consults Spiritual Care Consult Needed: No Transition of Care Team Consult Needed: No Advance Directives (For Healthcare) Does Patient Have a Medical Advance Directive?: No Would patient like information on creating a medical advance directive?: No - Patient declined          Disposition: Per Psyc NP patient is recommended for Inpatient Hospitalization Disposition Initial Assessment  Completed for this Encounter: Yes Disposition of  Patient: Admit Type of inpatient treatment program: Adult Patient refused recommended treatment: No  On Site Evaluation by:   Reviewed with Physician:    Benay Pike MS LCASA 11/13/2019 1:37 AM

## 2019-11-13 NOTE — ED Notes (Signed)
Snack given.

## 2019-11-14 DIAGNOSIS — F259 Schizoaffective disorder, unspecified: Secondary | ICD-10-CM | POA: Diagnosis not present

## 2019-11-14 LAB — GLUCOSE, CAPILLARY
Glucose-Capillary: 247 mg/dL — ABNORMAL HIGH (ref 70–99)
Glucose-Capillary: 275 mg/dL — ABNORMAL HIGH (ref 70–99)
Glucose-Capillary: 281 mg/dL — ABNORMAL HIGH (ref 70–99)
Glucose-Capillary: 354 mg/dL — ABNORMAL HIGH (ref 70–99)

## 2019-11-14 MED ORDER — INSULIN DETEMIR 100 UNIT/ML ~~LOC~~ SOLN
20.0000 [IU] | Freq: Two times a day (BID) | SUBCUTANEOUS | Status: DC
  Administered 2019-11-14 – 2019-11-17 (×6): 20 [IU] via SUBCUTANEOUS
  Filled 2019-11-14 (×8): qty 0.2

## 2019-11-14 MED ORDER — ACETAMINOPHEN 325 MG PO TABS
650.0000 mg | ORAL_TABLET | Freq: Four times a day (QID) | ORAL | Status: DC | PRN
  Administered 2019-11-14 – 2019-11-17 (×6): 650 mg via ORAL
  Filled 2019-11-14 (×8): qty 2

## 2019-11-14 MED ORDER — INSULIN ASPART 100 UNIT/ML ~~LOC~~ SOLN
7.0000 [IU] | Freq: Once | SUBCUTANEOUS | Status: AC
  Administered 2019-11-14: 7 [IU] via INTRAVENOUS

## 2019-11-14 NOTE — BH Assessment (Signed)
TTS completed reassessment. Pt presented calm, alert and oriented x 3. Pt reports to be doing well and expressed preferences to return to [facility] stating "tell them I am sorry for my behaviors and I will do better". Pt denies any current SI/HI/AH/VH and contracted for safety. Pt provided permission for TTS to contact her guardian and GH on file.   TTS contacted pt's legal guardian Thelma Barge [phone]): Thelma Barge denied being pt's legal guardian and identified herself as pt's sister. Thelma Barge reports to be unsure if pt has a legal guardian and confirmed pt's current [facility] ([facility]). Thelma Barge expressed wishes to be updated on her sister's care.   TTS contacted GH ([phone]): [facility] staff Johnny Bridge) expressed to be only filling in for today and unable to provide any assistance. Johnny Bridge directed TTS to contact the [facility] [phone]). TTS attempted to contact Elease Hashimoto 2 x's but received no answer and was unable to leave a voicemail.   Per Dr. Smith Robert pt can be discharged back to [facility]

## 2019-11-14 NOTE — ED Notes (Signed)
Patient given snack.  

## 2019-11-14 NOTE — ED Notes (Signed)
Pt co pain to her legs MD informed and order received for tylenol.

## 2019-11-14 NOTE — BH Assessment (Signed)
Referral information for Psychiatric Hospitalization faxed to;   [facility] [phone]),   . [facility] ([phone]),  . [facility] [phone]),  No answer  . [facility] [phone] -or- [phone]),   . [facility] [phone])   [facility] [phone])

## 2019-11-15 DIAGNOSIS — F259 Schizoaffective disorder, unspecified: Secondary | ICD-10-CM | POA: Diagnosis not present

## 2019-11-15 LAB — GLUCOSE, CAPILLARY
Glucose-Capillary: 94 mg/dL (ref 70–99)
Glucose-Capillary: 291 mg/dL — ABNORMAL HIGH (ref 70–99)

## 2019-11-15 LAB — URINE CULTURE: Culture: >=100000 — AB

## 2019-11-15 MED ORDER — HALOPERIDOL 0.5 MG PO TABS
0.5000 mg | ORAL_TABLET | ORAL | Status: DC
  Administered 2019-11-16 – 2019-11-17 (×2): 0.5 mg via ORAL
  Filled 2019-11-15 (×2): qty 1

## 2019-11-15 MED ORDER — DONEPEZIL HCL 5 MG PO TABS
10.0000 mg | ORAL_TABLET | Freq: Every day | ORAL | Status: DC
  Administered 2019-11-15 – 2019-11-16 (×2): 10 mg via ORAL
  Filled 2019-11-15 (×3): qty 2

## 2019-11-15 MED ORDER — HALOPERIDOL 2 MG PO TABS
4.0000 mg | ORAL_TABLET | Freq: Every day | ORAL | Status: DC
  Administered 2019-11-15 – 2019-11-16 (×2): 4 mg via ORAL
  Filled 2019-11-15 (×4): qty 2

## 2019-11-15 MED ORDER — DIPHENHYDRAMINE HCL 25 MG PO CAPS
25.0000 mg | ORAL_CAPSULE | Freq: Every day | ORAL | Status: DC
  Administered 2019-11-15 – 2019-11-16 (×2): 25 mg via ORAL
  Filled 2019-11-15 (×2): qty 1

## 2019-11-15 MED ORDER — POTASSIUM CHLORIDE CRYS ER 20 MEQ PO TBCR
10.0000 meq | EXTENDED_RELEASE_TABLET | Freq: Every day | ORAL | Status: DC
  Administered 2019-11-16 – 2019-11-17 (×2): 10 meq via ORAL
  Filled 2019-11-15 (×2): qty 1

## 2019-11-15 MED ORDER — DULOXETINE HCL 20 MG PO CPEP
20.0000 mg | ORAL_CAPSULE | Freq: Every day | ORAL | Status: DC
  Administered 2019-11-16: 20 mg via ORAL
  Filled 2019-11-15 (×2): qty 1

## 2019-11-15 MED ORDER — LISINOPRIL 5 MG PO TABS
5.0000 mg | ORAL_TABLET | Freq: Every day | ORAL | Status: DC
  Administered 2019-11-16 – 2019-11-17 (×2): 5 mg via ORAL
  Filled 2019-11-15 (×2): qty 1

## 2019-11-15 MED ORDER — ADULT MULTIVITAMIN W/MINERALS CH
1.0000 | ORAL_TABLET | Freq: Every day | ORAL | Status: DC
  Administered 2019-11-16 – 2019-11-17 (×2): 1 via ORAL
  Filled 2019-11-15 (×2): qty 1

## 2019-11-15 MED ORDER — BENZTROPINE MESYLATE 1 MG PO TABS: 0.5000 mg | ORAL_TABLET | Freq: Every day | ORAL | Status: DC

## 2019-11-15 MED ORDER — ACETAMINOPHEN 325 MG PO TABS
650.0000 mg | ORAL_TABLET | Freq: Four times a day (QID) | ORAL | Status: DC | PRN
  Administered 2019-11-15 – 2019-11-16 (×3): 650 mg via ORAL
  Filled 2019-11-15: qty 2

## 2019-11-15 MED ORDER — HALOPERIDOL 0.5 MG PO TABS: 2.0000 mg | ORAL_TABLET | Freq: Every day | ORAL | Status: DC

## 2019-11-15 MED ORDER — DEUTETRABENAZINE 12 MG PO TABS: 12.0000 mg | ORAL_TABLET | Freq: Every morning | ORAL | Status: DC

## 2019-11-15 MED ORDER — TIOTROPIUM BROMIDE MONOHYDRATE 18 MCG IN CAPS
18.0000 ug | ORAL_CAPSULE | Freq: Every day | RESPIRATORY_TRACT | Status: DC
  Administered 2019-11-16 – 2019-11-17 (×2): 18 ug via RESPIRATORY_TRACT
  Filled 2019-11-15: qty 5

## 2019-11-15 NOTE — BH Assessment (Signed)
TTS spoke to nursing home [facility]) owner (Mr.Brown [phone]) and confirmed pt's ability to return back to the home. Mr. Manson Passey confirmed acceptance but reported to be unable to provide transportation. Mr. Manson Passey agreed to pt being transported by her husband/sister back to the home. Pt current RN agree to contact Pt's husband (Mr. Charyl Dancer, [phone]) or sister Thelma Barge [phone]) to coordinate transportation.  Per Dr. Smith Robert pt is psychiatrically cleared for discharge.

## 2019-11-15 NOTE — ED Notes (Addendum)
Her sister - her legal guardian has requested that the doctor call her back  Please call her at her mobile number

## 2019-11-15 NOTE — ED Notes (Signed)
Pt is report leg pain, and want tropical analgesic. Will pass along to the next care provider/ RN

## 2019-11-15 NOTE — ED Notes (Signed)
Patient refused her lunch,patient state's no food get it out

## 2019-11-15 NOTE — ED Provider Notes (Signed)
Emergency Medicine Observation Re-evaluation Note  Carrie Clark is a 59 y.o. female, seen on rounds today.  Pt initially presented to the ED for complaints of Knee Pain, Flank Pain, and Psychiatric Evaluation Currently, the patient is not wanting to talk to me. I ask her if there are any concerns with her legs and she replies no. She otherwise does not want to respond to my questions.   Physical Exam  BP 117/82 (BP Location: Right Arm)   Pulse 70   Temp 97.9 F (36.6 C) (Oral)   Resp 18   Ht 5\' 3"  (1.6 m)   Wt 77.1 kg   SpO2 96%   BMI 30.11 kg/m  Physical Exam  Physical Exam General: No apparent distress HEENT: moist mucous membranes CV: RRR Pulm: Normal WOB GI: soft and non tender MSK: no edema or cyanosis Neuro: face symmetric, moving all extremities     ED Course / MDM  EKG:  Clinical Course as of Nov 14 901  Fri Nov 12, 2019  1722 Spoke with group home member; in last couple weeks have seen behavioral changes with increased agitation. Feels her concern is that she could be a danger to other residents. Patient has been doing to kitchen and grabbing knives and making threatening moves "charging" at another resident and "spit on another resident."   [MQ]  1724 No known recent illness, fevers, medication changes or other new causes for behavior changes per her group home team.    [MQ]  1731 Petition for involuntary commitment placed at this time.  Patient evidently was just about to walk out of the ER, have just spoken with her group home manager and placed IVC at this time.   [MQ]    Clinical Course User Index [MQ] Nov 14, 2019, MD   I have reviewed the labs performed to date as well as medications administered while in observation.  Recent changes in the last 24 hours include glucose of 94, better with medications now.   Plan  Current plan is for pending placement   Patient is under full IVC at this time.   Sharyn Creamer, MD 11/15/19 1003

## 2019-11-15 NOTE — Progress Notes (Signed)
[facility] MD Progress Note  11/15/2019 4:31 PM Carrie Clark  MRN:  [id number] Subjective:   I do not know  Principal Problem: Schizoaffective disorder, bipolar type (HCC) Diagnosis: Principal Problem:   Schizoaffective disorder, bipolar type (HCC)  Total Time spent with patient:  30 min  Past Psychiatric History: long history of s/a disorder in NH but still with psychosis and unclear ability to go back yet   Past Medical History:  Past Medical History:  Diagnosis Date  . COPD (chronic obstructive pulmonary disease) (HCC)   . CVA (cerebral infarction)   . Depression   . Hypertension   . Hypothyroidism   . Schizoaffective disorder, bipolar type (HCC)   . Schizophrenia (HCC)   . Stroke (HCC)   . Tardive dyskinesia   . Tardive dyskinesia     Past Surgical History:  Procedure Laterality Date  . NO PAST SURGERIES     Family History:  Family History  Family history unknown: Yes   Family Psychiatric  History:  See previous  Social History:  Social History   Substance and Sexual Activity  Alcohol Use No     Social History   Substance and Sexual Activity  Drug Use No    Social History   Socioeconomic History  . Marital status: Single    Spouse name: Not on file  . Number of children: Not on file  . Years of education: Not on file  . Highest education level: Not on file  Occupational History  . Not on file  Tobacco Use  . Smoking status: Current Every Day Smoker    Packs/day: 1.00    Types: Cigarettes  . Smokeless tobacco: Never Used  Vaping Use  . Vaping Use: Never used  Substance and Sexual Activity  . Alcohol use: No  . Drug use: No  . Sexual activity: Not on file  Other Topics Concern  . Not on file  Social History Narrative   Lives at [facility]   Social Determinants of Health   Financial Resource Strain: Low Risk   . Difficulty of Paying Living Expenses: Not hard at all  Food Insecurity: No Food Insecurity  . Worried About Patent examiner in the Last Year: Never true  . Ran Out of Food in the Last Year: Never true  Transportation Needs: No Transportation Needs  . Lack of Transportation (Medical): No  . Lack of Transportation (Non-Medical): No  Physical Activity: Inactive  . Days of Exercise per Week: 0 days  . Minutes of Exercise per Session: 0 min  Stress: No Stress Concern Present  . Feeling of Stress : Not at all  Social Connections: Socially Isolated  . Frequency of Communication with Friends and Family: Once a week  . Frequency of Social Gatherings with Friends and Family: Once a week  . Attends Religious Services: Never  . Active Member of Clubs or Organizations: No  . Attends Banker Meetings: Never  . Marital Status: Never married   Additional Social History:    Pain Medications: See MAR Prescriptions: See MAR Over the Counter: See MAR History of alcohol / drug use?: No history of alcohol / drug abuse                    Sleep: fair   Appetite:  faor   Current Medications: Current Facility-Administered Medications  Medication Dose Route Frequency Provider Last Rate Last Admin  . acetaminophen (TYLENOL) tablet 650 mg  650 mg Oral  Q6H PRN Paulette Blanch, MD   650 mg at 11/15/19 0101  . acetaminophen (TYLENOL) tablet 650 mg  650 mg Oral Q6H PRN Eulas Post, MD      . albuterol (PROVENTIL) (2.5 MG/3ML) 0.083% nebulizer solution 2.5 mg  2.5 mg Inhalation Q4H PRN Delman Kitten, MD      . atorvastatin (LIPITOR) tablet 40 mg  40 mg Oral QHS Delman Kitten, MD   40 mg at 11/14/19 2123  . [START ON 11/16/2019] Deutetrabenazine TABS 12 mg  12 mg Oral q morning - 10a Eulas Post, MD      . diphenhydrAMINE (BENADRYL) capsule 25 mg  25 mg Oral QHS Eulas Post, MD      . donepezil (ARICEPT) tablet 10 mg  10 mg Oral QHS Eulas Post, MD      . DULoxetine (CYMBALTA) DR capsule 20 mg  20 mg Oral Daily Eulas Post, MD      . famotidine (PEPCID) tablet 20 mg  20 mg Oral BID  Delman Kitten, MD   20 mg at 11/15/19 0858  . [START ON 11/16/2019] haloperidol (HALDOL) tablet 0.5 mg  0.5 mg Oral Cheryle Horsfall, MD      . haloperidol (HALDOL) tablet 4 mg  4 mg Oral QHS Eulas Post, MD      . insulin aspart (novoLOG) injection 0-9 Units  0-9 Units Subcutaneous TID WC Delman Kitten, MD   5 Units at 11/14/19 1754  . insulin detemir (LEVEMIR) injection 20 Units  20 Units Subcutaneous BID Duffy Bruce, MD   20 Units at 11/15/19 0859  . levothyroxine (SYNTHROID) tablet 100 mcg  100 mcg Oral QAC breakfast Delman Kitten, MD   100 mcg at 11/15/19 0858  . lisinopril (ZESTRIL) tablet 5 mg  5 mg Oral Daily Eulas Post, MD      . LORazepam (ATIVAN) tablet 1 mg  1 mg Oral BID PRN Delman Kitten, MD   1 mg at 11/15/19 0858  . metFORMIN (GLUCOPHAGE) tablet 1,000 mg  1,000 mg Oral BID Delman Kitten, MD   1,000 mg at 11/15/19 0858  . multivitamin with minerals tablet 1 tablet  1 tablet Oral Daily Eulas Post, MD      . potassium chloride SA (KLOR-CON) CR tablet 10 mEq  10 mEq Oral Daily Eulas Post, MD      . tiotropium [facility]) inhalation capsule ([facility] use ONLY) 18 mcg  18 mcg Inhalation Daily Eulas Post, MD       Current Outpatient Medications  Medication Sig Dispense Refill  . acetaminophen (TYLENOL) 325 MG tablet Take 2 tablets (650 mg total) by mouth every 6 (six) hours as needed for moderate pain. 60 tablet 0  . albuterol (PROVENTIL HFA;VENTOLIN HFA) 108 (90 Base) MCG/ACT inhaler Inhale 2 puffs into the lungs every 6 (six) hours as needed for wheezing or shortness of breath. (Patient taking differently: Inhale 2 puffs into the lungs every 4 (four) hours as needed for wheezing or shortness of breath. ) 1 Inhaler 0  . atorvastatin (LIPITOR) 40 MG tablet Take 40 mg by mouth at bedtime.     . benztropine (COGENTIN) 1 MG tablet Take 1 mg by mouth 2 (two) times daily.     . cetirizine (ZYRTEC) 10 MG tablet Take 1 tablet (10 mg total) by mouth daily.    .  Deutetrabenazine (AUSTEDO) 12 MG TABS Take 12 mg by mouth in the morning and at bedtime.    . donepezil (ARICEPT) 5 MG tablet Take 5 mg  by mouth at bedtime.    . famotidine (PEPCID) 20 MG tablet Take 1 tablet (20 mg total) by mouth 2 (two) times daily.    . fluPHENAZine (PROLIXIN) 10 MG tablet Take 1 tablet (10 mg total) by mouth 3 (three) times daily. 90 tablet 0  . fluPHENAZine decanoate (PROLIXIN) 25 MG/ML injection Inject 50 mg into the muscle every 28 (twenty-eight) days.    . furosemide (LASIX) 20 MG tablet Take 1 tablet (20 mg total) by mouth daily. 30 tablet   . Insulin Detemir (LEVEMIR) 100 UNIT/ML Pen Inject 20 Units into the skin 2 (two) times daily.     Marland Kitchen levothyroxine (SYNTHROID) 100 MCG tablet Take 100 mcg by mouth daily before breakfast.     . lisinopril (PRINIVIL,ZESTRIL) 5 MG tablet Take 5 mg by mouth daily.    Marland Kitchen lurasidone (LATUDA) 20 MG TABS tablet Take 20 mg by mouth daily in the afternoon.     . metFORMIN (GLUCOPHAGE) 1000 MG tablet Take 1 tablet (1,000 mg total) by mouth 2 (two) times daily.    . mirtazapine (REMERON) 15 MG tablet Take 15 mg by mouth at bedtime.    . Multiple Vitamins-Minerals (MULTIVITAMIN WITH MINERALS) tablet Take 1 tablet by mouth daily.    . Oxcarbazepine (TRILEPTAL) 300 MG tablet Take 1 tablet (300 mg total) by mouth 2 (two) times daily. 60 tablet 0  . potassium chloride (K-DUR,KLOR-CON) 10 MEQ tablet Take 1 tablet (10 mEq total) by mouth daily.    . prazosin (MINIPRESS) 1 MG capsule Take 1 mg by mouth at bedtime.    Marland Kitchen tiotropium (SPIRIVA) 18 MCG inhalation capsule Place 18 mcg into inhaler and inhale daily.    Marland Kitchen zolpidem (AMBIEN) 5 MG tablet Take 5 mg by mouth at bedtime.      Lab Results:  Results for orders placed or performed during the hospital encounter of 11/12/19 (from the past 48 hour(s))  Glucose, capillary     Status: Abnormal   Collection Time: 11/13/19  5:33 PM  Result Value Ref Range   Glucose-Capillary 217 (H) 70 - 99 mg/dL     Comment: Glucose reference range applies only to samples taken after fasting for at least 8 hours.  Glucose, capillary     Status: Abnormal   Collection Time: 11/14/19 12:54 AM  Result Value Ref Range   Glucose-Capillary 275 (H) 70 - 99 mg/dL    Comment: Glucose reference range applies only to samples taken after fasting for at least 8 hours.  Glucose, capillary     Status: Abnormal   Collection Time: 11/14/19  9:51 AM  Result Value Ref Range   Glucose-Capillary 247 (H) 70 - 99 mg/dL    Comment: Glucose reference range applies only to samples taken after fasting for at least 8 hours.  Glucose, capillary     Status: Abnormal   Collection Time: 11/14/19  5:26 PM  Result Value Ref Range   Glucose-Capillary 281 (H) 70 - 99 mg/dL    Comment: Glucose reference range applies only to samples taken after fasting for at least 8 hours.  Glucose, capillary     Status: Abnormal   Collection Time: 11/14/19  7:51 PM  Result Value Ref Range   Glucose-Capillary 354 (H) 70 - 99 mg/dL    Comment: Glucose reference range applies only to samples taken after fasting for at least 8 hours.  Glucose, capillary     Status: None   Collection Time: 11/15/19  8:07 AM  Result Value Ref  Range   Glucose-Capillary 94 70 - 99 mg/dL    Comment: Glucose reference range applies only to samples taken after fasting for at least 8 hours.   Comment 1 Notify RN     Blood Alcohol level:  Lab Results  Component Value Date   ETH <10 11/12/2019   ETH <10 11/08/2019    Metabolic Disorder Labs: Lab Results  Component Value Date   HGBA1C 10.3 (H) 11/12/2019   MPG 248.91 11/12/2019   No results found for: PROLACTIN No results found for: CHOL, TRIG, HDL, CHOLHDL, VLDL, LDLCALC  Physical Findings: AIMS:  , ,  ,  ,    CIWA:    COWS:     Musculoskeletal: Strength & Muscle Tone:  Complains of leg pain but we do not know the origin yet  Gait & Station:  Limited  Patient leans:  N/a   Psychiatric Specialty  Exam: Physical Exam  Review of Systems  Blood pressure 117/82, pulse 70, temperature 97.9 F (36.6 C), temperature source Oral, resp. rate 18, height 5\' 3"  (1.6 m), weight 77.1 kg, SpO2 96 %.Body mass index is 30.11 kg/m.      Mental Status --limited     She is spontaneously shouting on and off --at times not making sense Not very cooperative Oriented to person mainly  Anxious at times agitated Talking to self   Exam limited at this point  She does not seem fluctuant or clouded  Speech somewhat loud   Unclear safety margin                                    Assets:  Not clear   ADL's:  Limited   Cognition:  Ongoing decline  Sleep:   waxes and wanes      Treatment Plan Summary:  Patient remains on IVC ---pending stablization and gero Bed   Cannot discharge yet due to overall state and MS ---  I tried to simplify and change her psych meds so we will see if this helps   See orders   Awaits Gero psych bed at this time   Rather than d/c back to [facility]     [facility], MD 11/15/2019, 4:31 PM

## 2019-11-15 NOTE — ED Notes (Signed)
IVC/  PENDING  PLACEMENT 

## 2019-11-15 NOTE — ED Notes (Signed)
Her legal guardian - her sister requests that the doctor call her back today

## 2019-11-15 NOTE — ED Notes (Signed)
She has refused to allow anyone to take her CBG at this time

## 2019-11-15 NOTE — ED Notes (Addendum)
Pt is IVC  - pt has been upset - angry and verbalizing threats all day   TTS last evening states pt may return to [facility] but she is not stable     I spoke with MD Smith Robert he reports pt will be referred for inpatient placement  - appears that TTS Harvin Hazel has referred her out to geripsych already - follow up required  Her sister has called multiple times today and I have provided her updates  - she has requested a call back from the counselor and/or MD each time   Documented requests and I verbally informed TTS that the pts legal guardian is requesting a call back

## 2019-11-15 NOTE — ED Notes (Signed)
Pt can be heard into the hallway having a conversation with herself  "I am going to kill you - I am not taking this shit anymore - I am going to kill you"  The door of her room is closed but she is excessively loud at times

## 2019-11-15 NOTE — ED Notes (Signed)
She refuses to allow staff to check her CBG at this time

## 2019-11-15 NOTE — ED Provider Notes (Signed)
Dr. Smith Robert advises CONTINUE IVC.    Sharyn Creamer, MD 11/15/19 [phone]

## 2019-11-15 NOTE — ED Notes (Signed)
Patient refused breakfast ,patient state's don't leave it I don't want it.

## 2019-11-15 NOTE — ED Notes (Signed)

## 2019-11-15 NOTE — ED Notes (Signed)
Hourly rounding reveals patient in room. No complaints, stable, in no acute distress. Q15 minute rounds and monitoring via Security Cameras to continue. 

## 2019-11-15 NOTE — ED Notes (Signed)
Pt requesting tylenol and her ativan  - she refused all other meds that were ordered at this time and she refused to allow me to check her CBG

## 2019-11-16 ENCOUNTER — Emergency Department: Payer: Medicare Other

## 2019-11-16 DIAGNOSIS — F259 Schizoaffective disorder, unspecified: Secondary | ICD-10-CM | POA: Diagnosis not present

## 2019-11-16 LAB — GLUCOSE, CAPILLARY
Glucose-Capillary: 148 mg/dL — ABNORMAL HIGH (ref 70–99)
Glucose-Capillary: 200 mg/dL — ABNORMAL HIGH (ref 70–99)
Glucose-Capillary: 255 mg/dL — ABNORMAL HIGH (ref 70–99)

## 2019-11-16 MED ORDER — DULOXETINE HCL 30 MG PO CPEP
30.0000 mg | ORAL_CAPSULE | Freq: Every day | ORAL | Status: DC
  Administered 2019-11-17: 30 mg via ORAL
  Filled 2019-11-16: qty 1

## 2019-11-16 MED ORDER — ONDANSETRON 4 MG PO TBDP
4.0000 mg | ORAL_TABLET | Freq: Once | ORAL | Status: AC
  Filled 2019-11-16: qty 1

## 2019-11-16 MED ORDER — ONDANSETRON HCL 4 MG PO TABS: 4.0000 mg | ORAL_TABLET | Freq: Once | ORAL | Status: DC

## 2019-11-16 MED ORDER — ONDANSETRON 4 MG PO TBDP
ORAL_TABLET | ORAL | Status: AC
  Administered 2019-11-16: 4 mg via ORAL
  Filled 2019-11-16: qty 1

## 2019-11-16 NOTE — ED Notes (Signed)
Pt. Alert and oriented, warm and dry, in no distress. Pt. Denies SI, HI, and AVH.  Pt complains of left leg pain of 8/10 in pain. Pt. Encouraged to let nursing staff know of any concerns or needs.

## 2019-11-16 NOTE — ED Notes (Signed)
Snack tray and drink given. 

## 2019-11-16 NOTE — Final Progress Note (Signed)
Physician Final Progress Note  Patient ID: Quinnie Barcelo MRN: [id number] DOB/AGE: June 15, 1960 59 y.o.  Admit date: 11/12/2019 Admitting provider:   ER MD and PsychMD  Not admitted    Discharge date: 11/16/2019   Admission Diagnoses: schizoaffective disorder generalized anxiety    Discharge Diagnoses:  Principal Problem:   Schizoaffective disorder, bipolar type (HCC) Generalized anxiety    Consults:  TTS   ED MD and Psych MD  Significant Findings/ Diagnostic Studies:  None in general ----discharge today back to NH pending final check of her leg complaints    Patient's MS seems clearer now with simplification and change of meds   She is wanting to Go back to NH ---and wants her leg to be checked by ER first.  She is clearer in consciousness, more animated, mood improving, more engagint per se  Less or no shouting spells, ---or strange cognitions  She feels better on new med regimen  She has no active SI HI or plans       Procedures: none   Discharge Condition:  Fair    Disposition:  back to NH   Diet:   As tolerated   Discharge Activity:  As tolerated with activities in NH   Home with safety plan  IVC rescinded     Total time spent taking care of this patient: 25 minutes  Signed: Roselind Messier 11/16/2019, 1:58 PM

## 2019-11-16 NOTE — ED Notes (Signed)
Pts sister - her legal guardian is requesting a call back from the psychiatrist and/or TTS counselor

## 2019-11-16 NOTE — ED Notes (Signed)
She is verbalizing nausea -

## 2019-11-16 NOTE — ED Notes (Addendum)
pts legal guardian has called and reports that she is still waiting for the psychiatrist to contact her   Legal guardian has filed APS charges against group home  TTS to verify if pt may return to group home   Psychiatry rescinded IVC status today

## 2019-11-16 NOTE — ED Notes (Signed)
IVC  PAPERS  RESCINDED  PER  DR  RAO  MD  INFORMED  RN  AMY  TEAGUE

## 2019-11-16 NOTE — ED Notes (Signed)

## 2019-11-16 NOTE — ED Provider Notes (Signed)
Emergency Medicine Observation Re-evaluation Note  Carrie Clark is a 59 y.o. female, seen on rounds today.  Pt initially presented to the ED for complaints of Knee Pain, Flank Pain, and Psychiatric Evaluation Currently, the patient has no complaints  Physical Exam  BP (!) 159/83 (BP Location: Right Arm)   Pulse 85   Temp 98.7 F (37.1 C) (Oral)   Resp 17   Ht 5\' 3"  (1.6 m)   Wt 77.1 kg   SpO2 97%   BMI 30.11 kg/m  Physical Exam  General: No apparent distress HEENT: moist mucous membranes CV: RRR Pulm: Normal WOB GI: soft and non tender MSK: no edema or cyanosis Neuro: face symmetric, moving all extremities   ED Course / MDM   I have reviewed the labs performed to date as well as medications administered while in observation.  No acute events overnight Plan  Current plan is for psychiatric placement. Patient is under full IVC at this time.   , Don Perking, MD 11/16/19 [phone]

## 2019-11-16 NOTE — BH Assessment (Addendum)
TTS was contacted 2x's by Carrie Clark sister Carrie Clark) since 9am. TTS updated Carrie Clark on Carrie Clark's current status and plan. Carrie Clark was receptive to TTS contacting her once a bed becomes available for Carrie Clark.   TTS and psych completed reassessment. Carrie Clark presented calm, pleasant and oriented x 3. Carrie Clark reports to feel a lot better and confirmed her new medications to be working. Carrie Clark expressed wishes to go home but requested to have her leg check out by ER MD. Carrie Clark is currently receptive to having her leg checked out before discharge. Carrie Clark denies any current SI/HI/AV/VH and contracted for safety.   Per Dr. Smith Robert Carrie Clark's current disposition is to be discharged back to her group home today once her leg issue is resolved

## 2019-11-17 DIAGNOSIS — F259 Schizoaffective disorder, unspecified: Secondary | ICD-10-CM | POA: Diagnosis not present

## 2019-11-17 LAB — GLUCOSE, CAPILLARY
Glucose-Capillary: 105 mg/dL — ABNORMAL HIGH (ref 70–99)
Glucose-Capillary: 177 mg/dL — ABNORMAL HIGH (ref 70–99)
Glucose-Capillary: 142 mg/dL — ABNORMAL HIGH (ref 70–99)

## 2019-11-17 MED ORDER — HALOPERIDOL 2 MG PO TABS
4.0000 mg | ORAL_TABLET | Freq: Every day | ORAL | 0 refills | Status: DC
Start: 2019-11-17 — End: 2024-01-15

## 2019-11-17 MED ORDER — DIPHENHYDRAMINE HCL 25 MG PO TABS: 25.0000 mg | ORAL_TABLET | Freq: Every day | ORAL | 0 refills | Status: DC

## 2019-11-17 MED ORDER — HALOPERIDOL 0.5 MG PO TABS
0.5000 mg | ORAL_TABLET | ORAL | 0 refills | Status: DC
Start: 2019-11-17 — End: 2024-01-15

## 2019-11-17 MED ORDER — HALOPERIDOL DECANOATE 100 MG/ML IM SOLN: 25.0000 mg | INTRAMUSCULAR | 0 refills | Status: DC

## 2019-11-17 MED ORDER — HALOPERIDOL DECANOATE 100 MG/ML IM SOLN
25.0000 mg | Freq: Once | INTRAMUSCULAR | Status: AC
  Administered 2019-11-17: 25 mg via INTRAMUSCULAR
  Filled 2019-11-17: qty 0.25

## 2019-11-17 MED ORDER — DULOXETINE HCL 30 MG PO CPEP
30.0000 mg | ORAL_CAPSULE | Freq: Every day | ORAL | 0 refills | Status: AC
Start: 2019-11-17 — End: 2027-05-27

## 2019-11-17 NOTE — ED Notes (Signed)
Pt c/o anxiety and wanting to leave now, advised she is waiting for the [facility] staff to come pick her up after 6pm. Pt asked for something to help with anxiety, PRN Ativan given per orders.

## 2019-11-17 NOTE — ED Provider Notes (Addendum)
Emergency Medicine Observation Re-evaluation Note  Carrie Clark is a 59 y.o. female, seen on rounds today.  Pt initially presented to the ED for complaints of Knee Pain, Flank Pain, and Psychiatric Evaluation Currently, the patient is denying concern.  Physical Exam  BP (!) 140/105   Pulse 83   Temp 98 F (36.7 C) (Oral)   Resp 20   Ht 5\' 3"  (1.6 m)   Wt 77.1 kg   SpO2 96%   BMI 30.11 kg/m  Physical Exam Physical Exam General: No apparent distress HEENT: moist mucous membranes CV: RRR Pulm: Normal WOB GI: soft and non tender MSK: no edema or cyanosis Neuro: face symmetric, moving all extremities    ED Course / MDM  EKG:  I have reviewed the labs performed to date as well as medications administered while in observation.  Recent changes in the last 24 hours include sugar looks good.  Plan  Current plan is for psych re-eval  Patient is not under full IVC at this time.  D/w psych Dr. . Pt cleared to go back to facility on new meds. D/w Dr. Smith Robert and he went through with me the medications that need to be canceled and which ones to restart given he does not have access to write prescriptions.  Will give 30 day prescription of new meds.    Smith Robert, MD 11/17/19 1315    11/19/19, MD 11/17/19 [phone]

## 2019-11-17 NOTE — Final Progress Note (Signed)
Physician Final Progress Note  Patient ID: Carrie Clark MRN: [id number] DOB/AGE: 1961/03/20 59 y.o.  Admit date: 11/12/2019 Admitting provider: No admitting provider for patient encounter. Discharge date: 11/17/2019   Admission Diagnoses:   Schizoaffective disorder  Generalized anxiety    Discharge Diagnoses: Schizoaffective disorder. Generalized anxiety *  Consults:   TTS   ED MD Psych MD  Significant Findings/ Diagnostic Studies: { none  Procedures: none   Discharge Condition: { as stable as possible Disposition:  home back to NH they are picking her up    Patient's mental state improved with final discharge meds and discontinuing some of her previous home psych meds.   Cogentin 1 mg po bid Haldol 4 mg po qhs / 0.5 mg po qam  Haldol D 25 mg monthly she got one dose here before discharge Cymbalta 30 mg po daily  Benadryl 25 mg po qhs   Remeron, latuda d/ced --prolixin and prolxin D d/ced      MS brief at discharge Alert cooperative oriented to person and part of date Consciousness not clouded or fluctuant Mood improved, no outbursts Alert  No new psychosis or hallucinations  Contracts for safety  No active SI HI or plans Leg pain subsided she says  Less paranoia edgy fearful agitated and angry     Diet: { as tolerated  Discharge Activity:     As tolerated in NH      Total time spent taking care of this patient: 25-30-  minutes  Signed: Roselind Messier 11/17/2019, 1:26 PM

## 2019-11-17 NOTE — ED Notes (Signed)
Called pt's guardian Scarlette Calico [phone] to let her know pt is ready for discharge. She stated she feels pt needs to go for inpatient as she needs full time care and med management. Advised pt has been calm while here and denies SI/HI/AVH and psychiatry has decided to discharge. She says pt cannot go live with her. Says to call [facility] because she is at work. Called the [facility], spoke with Johnny Bridge, [facility] staff, she says she can't come pick pt up. She gave me the [facility] owners contact Larkin Ina [phone] but no pick up and VM is full.

## 2019-11-17 NOTE — ED Notes (Addendum)
Pt refused her Novolog coverage of 1 unit for BS 142. Pt stated "I don't want to eat so I'm not taking any insulin". EDP Dr Scotty Court notified, no new orders.

## 2019-11-17 NOTE — ED Notes (Signed)
Pt denies SI/HI/AVH on assessment 

## 2019-11-17 NOTE — ED Notes (Signed)
Larkin Ina called back and says transportation can come pick pt up after 6pm today.

## 2019-11-17 NOTE — Discharge Instructions (Addendum)
We discontinued some of patient's medications and added on some new medications per the psychiatric team.  Cogentin 1 mg po bid (previously on)  Haldol 4 mg po qhs / 0.5 mg po qam  Haldol D 25 mg monthly she got one dose here before discharge next dose wont be till July!  Cymbalta 30 mg po daily  Benadryl 25 mg po qhs    Remeron, latuda d/ced --prolixin and prolxin D d/ced

## 2020-03-31 ENCOUNTER — Other Ambulatory Visit: Payer: Self-pay

## 2020-03-31 ENCOUNTER — Emergency Department (HOSPITAL_COMMUNITY)
Admission: EM | Admit: 2020-03-31 | Discharge: 2020-04-01 | Disposition: A | Discharge status: Nursing Home (Includes ICF) | Payer: Medicare Other | Attending: Emergency Medicine | Admitting: Emergency Medicine

## 2020-03-31 ENCOUNTER — Encounter (HOSPITAL_COMMUNITY): Payer: Self-pay

## 2020-03-31 ENCOUNTER — Emergency Department (HOSPITAL_COMMUNITY): Payer: Medicare Other

## 2020-03-31 DIAGNOSIS — S0083XA Contusion of other part of head, initial encounter: Secondary | ICD-10-CM | POA: Insufficient documentation

## 2020-03-31 DIAGNOSIS — J449 Chronic obstructive pulmonary disease, unspecified: Secondary | ICD-10-CM | POA: Insufficient documentation

## 2020-03-31 DIAGNOSIS — I10 Essential (primary) hypertension: Secondary | ICD-10-CM | POA: Diagnosis not present

## 2020-03-31 DIAGNOSIS — Z794 Long term (current) use of insulin: Secondary | ICD-10-CM | POA: Diagnosis not present

## 2020-03-31 DIAGNOSIS — S0990XA Unspecified injury of head, initial encounter: Secondary | ICD-10-CM | POA: Diagnosis present

## 2020-03-31 DIAGNOSIS — Y9301 Activity, walking, marching and hiking: Secondary | ICD-10-CM | POA: Insufficient documentation

## 2020-03-31 DIAGNOSIS — F17210 Nicotine dependence, cigarettes, uncomplicated: Secondary | ICD-10-CM | POA: Insufficient documentation

## 2020-03-31 DIAGNOSIS — Z7984 Long term (current) use of oral hypoglycemic drugs: Secondary | ICD-10-CM | POA: Insufficient documentation

## 2020-03-31 DIAGNOSIS — W102XXA Fall (on)(from) incline, initial encounter: Secondary | ICD-10-CM | POA: Insufficient documentation

## 2020-03-31 DIAGNOSIS — W19XXXA Unspecified fall, initial encounter: Secondary | ICD-10-CM

## 2020-03-31 DIAGNOSIS — E119 Type 2 diabetes mellitus without complications: Secondary | ICD-10-CM | POA: Diagnosis not present

## 2020-03-31 DIAGNOSIS — E039 Hypothyroidism, unspecified: Secondary | ICD-10-CM | POA: Diagnosis not present

## 2020-03-31 DIAGNOSIS — Z79899 Other long term (current) drug therapy: Secondary | ICD-10-CM | POA: Insufficient documentation

## 2020-03-31 NOTE — ED Notes (Signed)
Patient stable at this time.  Resting on stretcher.  0 s/s acute distress.

## 2020-03-31 NOTE — ED Provider Notes (Signed)
[facility] EMERGENCY DEPARTMENT Provider Note   CSN: [id number] Arrival date & time: 03/31/20  1815     History Chief Complaint  Patient presents with   Fall    Patient sustained mechanical fall PTA after tripping walking up incline in hallway. No LOC, N/V or dizziness. Complaining of forehead pain where she is noted to have multiple abrasions from fall. No active bleeding.     Carrie Clark is a 59 y.o. female.  Patient states that she fell and hit her head no loss of consciousness.  Patient has bruising to forehead  The history is provided by the patient and medical records. No language interpreter was used.  Fall This is a new problem. The current episode started 12 to 24 hours ago. The problem occurs rarely. The problem has been resolved. Associated symptoms include headaches. Pertinent negatives include no chest pain and no abdominal pain. Nothing aggravates the symptoms. Nothing relieves the symptoms. She has tried nothing for the symptoms.       Past Medical History:  Diagnosis Date   COPD (chronic obstructive pulmonary disease) (HCC)    CVA (cerebral infarction)    Depression    Hypertension    Hypothyroidism    Schizoaffective disorder, bipolar type (HCC)    Schizophrenia (HCC)    Stroke (HCC)    Tardive dyskinesia    Tardive dyskinesia     Patient Active Problem List   Diagnosis Date Noted   UTI (urinary tract infection) 12/03/2018   Hypothyroidism 12/03/2018   HTN (hypertension) 12/03/2018   COPD (chronic obstructive pulmonary disease) (HCC) 12/03/2018   Diabetes (HCC) 12/03/2018   Altered mental status 12/03/2018   Schizoaffective disorder, bipolar type (HCC) 09/11/2015   Aggression    Episode of behavior change    Psychoses (HCC)     Past Surgical History:  Procedure Laterality Date   NO PAST SURGERIES       OB History   No obstetric history on file.     Family History  Family history unknown: Yes    Social  History   Tobacco Use   Smoking status: Current Every Day Smoker    Packs/day: 1.00    Types: Cigarettes   Smokeless tobacco: Never Used  Vaping Use   Vaping Use: Never used  Substance Use Topics   Alcohol use: No   Drug use: No    Home Medications Prior to Admission medications   Medication Sig Start Date End Date Taking? Authorizing Provider  acetaminophen (TYLENOL) 325 MG tablet Take 2 tablets (650 mg total) by mouth every 6 (six) hours as needed for moderate pain. 01/29/19   Mesner, Barbara Cower, MD  albuterol (PROVENTIL HFA;VENTOLIN HFA) 108 (90 Base) MCG/ACT inhaler Inhale 2 puffs into the lungs every 6 (six) hours as needed for wheezing or shortness of breath. Patient taking differently: Inhale 2 puffs into the lungs every 4 (four) hours as needed for wheezing or shortness of breath.  06/07/15   Mesner, Barbara Cower, MD  atorvastatin (LIPITOR) 40 MG tablet Take 40 mg by mouth at bedtime.     [provider]  benztropine (COGENTIN) 1 MG tablet Take 1 mg by mouth 2 (two) times daily.     [provider]  cetirizine (ZYRTEC) 10 MG tablet Take 1 tablet (10 mg total) by mouth daily. 11/11/14   Withrow, Everardo All, FNP  Deutetrabenazine (AUSTEDO) 12 MG TABS Take 12 mg by mouth in the morning and at bedtime.    [provider]  diphenhydrAMINE (  BENADRYL ALLERGY) 25 MG tablet Take 1 tablet (25 mg total) by mouth at bedtime. 11/17/19 12/17/19  Concha Se, MD  donepezil (ARICEPT) 5 MG tablet Take 5 mg by mouth at bedtime.    [provider]  DULoxetine (CYMBALTA) 30 MG capsule Take 1 capsule (30 mg total) by mouth daily. 11/17/19 12/17/19  Concha Se, MD  famotidine (PEPCID) 20 MG tablet Take 1 tablet (20 mg total) by mouth 2 (two) times daily. 11/11/14   Withrow, Everardo All, FNP  furosemide (LASIX) 20 MG tablet Take 1 tablet (20 mg total) by mouth daily. 11/11/14   Withrow, Everardo All, FNP  haloperidol (HALDOL) 0.5 MG tablet Take 1 tablet (0.5 mg total) by mouth every morning.  11/17/19 12/17/19  Concha Se, MD  haloperidol (HALDOL) 2 MG tablet Take 2 tablets (4 mg total) by mouth at bedtime. 11/17/19 12/17/19  Concha Se, MD  haloperidol decanoate (HALDOL DECANOATE) 100 MG/ML injection Inject 0.25 mLs (25 mg total) into the muscle every 28 (twenty-eight) days for 1 day. 12/17/19 12/18/19  Concha Se, MD  Insulin Detemir (LEVEMIR) 100 UNIT/ML Pen Inject 20 Units into the skin 2 (two) times daily.     [provider]  levothyroxine (SYNTHROID) 100 MCG tablet Take 100 mcg by mouth daily before breakfast.     [provider]  lisinopril (PRINIVIL,ZESTRIL) 5 MG tablet Take 5 mg by mouth daily.    [provider]  metFORMIN (GLUCOPHAGE) 1000 MG tablet Take 1 tablet (1,000 mg total) by mouth 2 (two) times daily. 09/11/15   Charm Rings, NP  Multiple Vitamins-Minerals (MULTIVITAMIN WITH MINERALS) tablet Take 1 tablet by mouth daily. 11/11/14   Withrow, Everardo All, FNP  potassium chloride (K-DUR,KLOR-CON) 10 MEQ tablet Take 1 tablet (10 mEq total) by mouth daily. 11/11/14   Withrow, Everardo All, FNP  tiotropium (SPIRIVA) 18 MCG inhalation capsule Place 18 mcg into inhaler and inhale daily.    [provider]  fluPHENAZine (PROLIXIN) 10 MG tablet Take 1 tablet (10 mg total) by mouth 3 (three) times daily. 12/03/18 11/17/19  Adrian Saran, MD  fluPHENAZine decanoate (PROLIXIN) 25 MG/ML injection Inject 50 mg into the muscle every 28 (twenty-eight) days.  11/17/19  [provider]  lurasidone (LATUDA) 20 MG TABS tablet Take 20 mg by mouth daily in the afternoon.   11/17/19  [provider]  Oxcarbazepine (TRILEPTAL) 300 MG tablet Take 1 tablet (300 mg total) by mouth 2 (two) times daily. 09/11/15 11/17/19  Charm Rings, NP  prazosin (MINIPRESS) 1 MG capsule Take 1 mg by mouth at bedtime.  11/17/19  [provider]  zolpidem (AMBIEN) 5 MG tablet Take 5 mg by mouth at bedtime.  11/17/19  [provider]    Allergies    Fish  allergy  Review of Systems   Review of Systems  Constitutional: Negative for appetite change and fatigue.  HENT: Negative for congestion, ear discharge and sinus pressure.   Eyes: Negative for discharge.  Respiratory: Negative for cough.   Cardiovascular: Negative for chest pain.  Gastrointestinal: Negative for abdominal pain and diarrhea.  Genitourinary: Negative for frequency and hematuria.  Musculoskeletal: Negative for back pain.  Skin: Negative for rash.  Neurological: Positive for headaches. Negative for seizures.  Psychiatric/Behavioral: Negative for hallucinations.    Physical Exam Updated Vital Signs BP (!) 113/52    Pulse 78    Temp 98 F (36.7 C) (Oral)    Resp 18  Ht 5\' 4"  (1.626 m)    Wt 72.6 kg    SpO2 96%    BMI 27.46 kg/m   Physical Exam Vitals and nursing note reviewed.  Constitutional:      Appearance: She is well-developed.  HENT:     Head: Normocephalic.     Mouth/Throat:     Mouth: Mucous membranes are moist.  Eyes:     General: No scleral icterus.    Conjunctiva/sclera: Conjunctivae normal.  Neck:     Thyroid: No thyromegaly.  Cardiovascular:     Rate and Rhythm: Normal rate and regular rhythm.     Heart sounds: No murmur heard.  No friction rub. No gallop.   Pulmonary:     Breath sounds: No stridor. No wheezing or rales.  Chest:     Chest wall: No tenderness.  Abdominal:     General: There is no distension.     Tenderness: There is no abdominal tenderness. There is no rebound.  Musculoskeletal:        General: Normal range of motion.     Cervical back: Neck supple.  Lymphadenopathy:     Cervical: No cervical adenopathy.  Skin:    Findings: No erythema or rash.  Neurological:     Mental Status: She is alert and oriented to person, place, and time.     Motor: No abnormal muscle tone.     Coordination: Coordination normal.     Comments: Mild bruising to forehead  Psychiatric:        Behavior: Behavior normal.     ED Results /  Procedures / Treatments   Labs (all labs ordered are listed, but only abnormal results are displayed) Labs Reviewed - No data to display  EKG None  Radiology CT Head Wo Contrast  Result Date: 03/31/2020 CLINICAL DATA:  Fall EXAM: CT HEAD WITHOUT CONTRAST TECHNIQUE: Contiguous axial images were obtained from the base of the skull through the vertex without intravenous contrast. COMPARISON:  None. FINDINGS: Brain: No evidence of acute territorial infarction, hemorrhage, hydrocephalus,extra-axial collection or mass lesion/mass effect. Normal gray-white differentiation. Ventricles are normal in size and contour. Vascular: No hyperdense vessel or unexpected calcification. Skull: The skull is intact. No fracture or focal lesion identified. Sinuses/Orbits: The visualized paranasal sinuses and mastoid air cells are clear. The orbits and globes intact. Other: None Cervical spine: Alignment: Again noted is a kyphosis of the cervical spine with a rightward curvature. There is a minimal anterolisthesis of C3 on C4 measuring 2 mm. Skull base and vertebrae: Visualized skull base is intact. No atlanto-occipital dissociation. The vertebral body heights are well maintained. No fracture or pathologic osseous lesion seen. Soft tissues and spinal canal: The visualized paraspinal soft tissues are unremarkable. No prevertebral soft tissue swelling is seen. The spinal canal is grossly unremarkable, no large epidural collection or significant canal narrowing. Disc levels: Multilevel cervical spine spondylosis is seen with large anterior osteophytes. There is ankylosis at the anterior osteophytes at C4-C5 and C6-C7. Disc osteophyte complex and uncovertebral osteophytes are most notable at C5-C6 with severe neural foraminal narrowing and mild to moderate central canal stenosis. Upper chest: The lung apices are clear. Thoracic inlet is within normal limits. Other: None IMPRESSION: No acute intracranial abnormality. No acute  fracture or malalignment of the spine. Cervical kyphosis with a grade 1 anterolisthesis of C3 on C4. 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 Electronically Signed   By: 13/09/2019  Avutu M.D.   On: 03/31/2020 20:18   CT Cervical Spine Wo Contrast  Result Date: 03/31/2020 CLINICAL DATA:  Fall EXAM: CT HEAD WITHOUT CONTRAST TECHNIQUE: Contiguous axial images were obtained from the base of the skull through the vertex without intravenous contrast. COMPARISON:  None. FINDINGS: Brain: No evidence of acute territorial infarction, hemorrhage, hydrocephalus,extra-axial collection or mass lesion/mass effect. Normal gray-white differentiation. Ventricles are normal in size and contour. Vascular: No hyperdense vessel or unexpected calcification. Skull: The skull is intact. No fracture or focal lesion identified. Sinuses/Orbits: The visualized paranasal sinuses and mastoid air cells are clear. The orbits and globes intact. Other: None Cervical spine: Alignment: Again noted is a kyphosis of the cervical spine with a rightward curvature. There is a minimal anterolisthesis of C3 on C4 measuring 2 mm. Skull base and vertebrae: Visualized skull base is intact. No atlanto-occipital dissociation. The vertebral body heights are well maintained. No fracture or pathologic osseous lesion seen. Soft tissues and spinal canal: The visualized paraspinal soft tissues are unremarkable. No prevertebral soft tissue swelling is seen. The spinal canal is grossly unremarkable, no large epidural collection or significant canal narrowing. Disc levels: Multilevel cervical spine spondylosis is seen with large anterior osteophytes. There is ankylosis at the anterior osteophytes at C4-C5 and C6-C7. Disc osteophyte complex and uncovertebral osteophytes are most notable at C5-C6 with severe neural foraminal narrowing and mild to moderate central canal stenosis. Upper  chest: The lung apices are clear. Thoracic inlet is within normal limits. Other: None IMPRESSION: No acute intracranial abnormality. No acute fracture or malalignment of the spine. Cervical kyphosis with a grade 1 anterolisthesis of C3 on C4. 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 Electronically Signed   By: Jonna ClarkBindu  Avutu M.D.   On: 03/31/2020 20:18    Procedures Procedures (including critical care time)  Medications Ordered in ED Medications - No data to display  ED Course  I have reviewed the triage vital signs and the nursing notes.  Pertinent labs & imaging results that were available during my care of the patient were reviewed by me and considered in my medical decision making (see chart for details).    MDM Rules/Calculators/A&P                          Patient with contusion to forehead.  CT head neck negative.  She will follow-up as needed take Tylenol for pain     This patient presents to the ED for concern of fall, this involves an extensive number of treatment options, and is a complaint that carries with it a high risk of complications and morbidity.  The differential diagnosis includes cerebral hemorrhage   Lab Tests:    Medicines ordered: Imaging Studies ordered:   I ordered imaging studies which included CT head and cervical spine which were unremarkable for acute problem  I independently visualized and interpreted imaging which showed unremarkable for acute problem  Additional history obtained:   Additional history obtained from records  Previous records obtained and reviewed.  Consultations Obtained:     Reevaluation:  After the interventions stated above, I reevaluated the patient and found no change  Critical Interventions:     Final Clinical Impression(s) / ED Diagnoses Final diagnoses:  Fall, initial encounter    Rx / DC Orders ED Discharge  Orders    None       Bethann BerkshireZammit, Mykaela Arena, MD  04/02/20 0923 ° °

## 2020-03-31 NOTE — ED Notes (Signed)
Patient resting in bed.  0 s/s acute distress.

## 2020-03-31 NOTE — Discharge Instructions (Addendum)
Take Tylenol for pain and follow-up with your doctor if any problems °

## 2020-03-31 NOTE — ED Notes (Signed)
Called phone number provided for group home [phone].  Spoke to [employer].  She states that no one is available to come pick up patient from ED tonight.

## 2020-03-31 NOTE — ED Notes (Signed)
Received care of patient resting on stretcher.  Caregiver from group home [facility] [phone] called and states they will send someone to pickup patient if discharged.  She requests that we call above number with any updates.  0 s/s acute distress.  Call bell in reach.

## 2020-03-31 NOTE — ED Notes (Signed)
Called back to group home.  Administrator for that facility is supposed to call this ED to discuss transportation of patient.

## 2020-04-01 NOTE — ED Notes (Signed)
Carrie Clark called to say that she will be here to pick up pt by about 0715.

## 2020-04-01 NOTE — ED Notes (Signed)
AC received call from [facility] stating we would need to contact her director, Larkin Ina, [phone]. AC attempted to contact with no success. Patients nurse made aware of contact attempts.

## 2020-04-01 NOTE — ED Notes (Signed)
AC attempted to call group home at [phone] to assist with arranging transportation. No answer and no way to leave message.

## 2020-04-01 NOTE — ED Notes (Signed)
AC called [phone] and spoke with Larey Days gave [facility] the administrators phone number. Meyer Cory [phone]

## 2020-04-01 NOTE — ED Notes (Signed)
Multiple attempts made to reach staff for group home without success.

## 2020-04-01 NOTE — ED Notes (Signed)
Still awaiting return phone call from administrator of group home to coordinate transport back to facility.

## 2020-04-01 NOTE — ED Notes (Signed)
Patient asked to use restroom, upon assisting patient out of bed, patient found to have soiled herself. Patient had incontinence of bowel. Patient cleaned, given adult brief and placed in blue paper scrub bottoms.

## 2020-07-11 ENCOUNTER — Ambulatory Visit: Payer: Medicare Other | Admitting: Podiatry

## 2020-08-08 ENCOUNTER — Ambulatory Visit: Payer: Medicare Other | Admitting: Podiatry

## 2020-08-29 ENCOUNTER — Ambulatory Visit: Payer: Medicare Other | Admitting: Podiatry

## 2020-09-06 ENCOUNTER — Other Ambulatory Visit: Payer: Self-pay

## 2020-09-06 ENCOUNTER — Encounter (HOSPITAL_COMMUNITY): Payer: Self-pay | Admitting: Emergency Medicine

## 2020-09-06 ENCOUNTER — Emergency Department (HOSPITAL_COMMUNITY)
Admission: EM | Admit: 2020-09-06 | Discharge: 2020-09-06 | Disposition: A | Discharge status: Home / Self Care | Payer: Medicare Other | Attending: Emergency Medicine | Admitting: Emergency Medicine

## 2020-09-06 ENCOUNTER — Emergency Department (HOSPITAL_COMMUNITY): Payer: Medicare Other

## 2020-09-06 DIAGNOSIS — E119 Type 2 diabetes mellitus without complications: Secondary | ICD-10-CM | POA: Insufficient documentation

## 2020-09-06 DIAGNOSIS — E039 Hypothyroidism, unspecified: Secondary | ICD-10-CM | POA: Insufficient documentation

## 2020-09-06 DIAGNOSIS — J449 Chronic obstructive pulmonary disease, unspecified: Secondary | ICD-10-CM | POA: Insufficient documentation

## 2020-09-06 DIAGNOSIS — E162 Hypoglycemia, unspecified: Secondary | ICD-10-CM

## 2020-09-06 DIAGNOSIS — Z79899 Other long term (current) drug therapy: Secondary | ICD-10-CM | POA: Diagnosis not present

## 2020-09-06 DIAGNOSIS — R464 Slowness and poor responsiveness: Secondary | ICD-10-CM | POA: Diagnosis present

## 2020-09-06 DIAGNOSIS — N3000 Acute cystitis without hematuria: Secondary | ICD-10-CM | POA: Diagnosis not present

## 2020-09-06 DIAGNOSIS — Z794 Long term (current) use of insulin: Secondary | ICD-10-CM | POA: Insufficient documentation

## 2020-09-06 DIAGNOSIS — Z7984 Long term (current) use of oral hypoglycemic drugs: Secondary | ICD-10-CM | POA: Insufficient documentation

## 2020-09-06 DIAGNOSIS — I10 Essential (primary) hypertension: Secondary | ICD-10-CM | POA: Insufficient documentation

## 2020-09-06 DIAGNOSIS — F17210 Nicotine dependence, cigarettes, uncomplicated: Secondary | ICD-10-CM | POA: Insufficient documentation

## 2020-09-06 LAB — CBC
HCT: 31.3 % — ABNORMAL LOW (ref 36.0–46.0)
Hemoglobin: 9.6 g/dL — ABNORMAL LOW (ref 12.0–15.0)
MCH: 27.7 pg (ref 26.0–34.0)
MCHC: 30.7 g/dL (ref 30.0–36.0)
MCV: 90.2 fL (ref 80.0–100.0)
Platelets: 345 10*3/uL (ref 150–400)
RBC: 3.47 MIL/uL — ABNORMAL LOW (ref 3.87–5.11)
RDW: 15.7 % — ABNORMAL HIGH (ref 11.5–15.5)
WBC: 16.4 10*3/uL — ABNORMAL HIGH (ref 4.0–10.5)
nRBC: 0 % (ref 0.0–0.2)

## 2020-09-06 LAB — BASIC METABOLIC PANEL
Anion gap: 11 (ref 5–15)
BUN: 27 mg/dL — ABNORMAL HIGH (ref 6–20)
CO2: 27 mmol/L (ref 22–32)
Calcium: 8.8 mg/dL — ABNORMAL LOW (ref 8.9–10.3)
Chloride: 98 mmol/L (ref 98–111)
Creatinine, Ser: 1.24 mg/dL — ABNORMAL HIGH (ref 0.44–1.00)
GFR, Estimated: 50 mL/min — ABNORMAL LOW (ref >60)
Glucose, Bld: 108 mg/dL — ABNORMAL HIGH (ref 70–99)
Potassium: 4.3 mmol/L (ref 3.5–5.1)
Sodium: 136 mmol/L (ref 135–145)

## 2020-09-06 LAB — URINALYSIS, ROUTINE W REFLEX MICROSCOPIC
Bilirubin Urine: NEGATIVE
Glucose, UA: NEGATIVE mg/dL
Hgb urine dipstick: NEGATIVE
Ketones, ur: NEGATIVE mg/dL
Nitrite: POSITIVE — AB
Protein, ur: NEGATIVE mg/dL
Specific Gravity, Urine: 1.012 (ref 1.005–1.030)
pH: 5 (ref 5.0–8.0)

## 2020-09-06 LAB — CBG MONITORING, ED
Glucose-Capillary: 125 mg/dL — ABNORMAL HIGH (ref 70–99)
Glucose-Capillary: 134 mg/dL — ABNORMAL HIGH (ref 70–99)
Glucose-Capillary: 92 mg/dL (ref 70–99)

## 2020-09-06 MED ORDER — CEPHALEXIN 500 MG PO CAPS
500.0000 mg | ORAL_CAPSULE | Freq: Three times a day (TID) | ORAL | 0 refills | Status: AC
Start: 2020-09-06 — End: 2020-09-13

## 2020-09-06 MED ORDER — CEPHALEXIN 500 MG PO CAPS
500.0000 mg | ORAL_CAPSULE | Freq: Once | ORAL | Status: AC
  Administered 2020-09-06: 500 mg via ORAL
  Filled 2020-09-06: qty 1

## 2020-09-06 NOTE — ED Notes (Signed)
Encouraged urine sample from pt; pt declined. Gave pt drink and snack. XR outside of room. Will ask again after finished eating

## 2020-09-06 NOTE — ED Notes (Signed)
Unable to get urine sample; stool in urine hat

## 2020-09-06 NOTE — ED Notes (Signed)
Pt denies needing to void at this time.  

## 2020-09-06 NOTE — ED Notes (Signed)
Ambulatory to restroom

## 2020-09-06 NOTE — ED Notes (Signed)
Pt wheeled out to lobby and helped to the car; report gave to facility worker.

## 2020-09-06 NOTE — ED Notes (Signed)
[facility]  [phone]

## 2020-09-06 NOTE — ED Notes (Signed)
Pt ate dinner and a diet ginerale. Now eating ice-cream

## 2020-09-06 NOTE — Discharge Instructions (Addendum)
Your blood sugar tonight has been too low, it has come up very nicely with the food that you have eaten, it may be that you did not have enough to eat or that you had too much medication so please make sure that before you take your medicine in the morning that your blood sugar is checked.    Please check your blood sugar every 4 hours throughout the evening If it goes low you should eat immediately  I want you to eat again tonight when you get home, please have some normal food.  You should have your family doctor recheck you within 1 or 2 days, hopefully before the weekend.  If you should have severe or worsening symptoms return to the emergency department  Take cephalexin 3 times a day for the next 7 days to treat for a urinary infection

## 2020-09-06 NOTE — ED Triage Notes (Signed)
Pt arrives with [facility] from [facility]. Pt was unresponsive earlier today due to hypoglycemia with CBG of 45. Pt given IM glucagon by facility and pt was alert and oriented with EMS arrived. Pt requested to come in to be seen.
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
## 2020-09-09 LAB — URINE CULTURE: Culture: >=100000 — AB

## 2020-10-18 ENCOUNTER — Other Ambulatory Visit: Payer: Self-pay

## 2020-10-18 ENCOUNTER — Ambulatory Visit (INDEPENDENT_AMBULATORY_CARE_PROVIDER_SITE_OTHER): Payer: Medicare Other | Admitting: Podiatry

## 2020-10-18 ENCOUNTER — Encounter: Payer: Self-pay | Admitting: Podiatry

## 2020-10-18 DIAGNOSIS — Z794 Long term (current) use of insulin: Secondary | ICD-10-CM

## 2020-10-18 DIAGNOSIS — E119 Type 2 diabetes mellitus without complications: Secondary | ICD-10-CM

## 2020-10-18 DIAGNOSIS — B351 Tinea unguium: Secondary | ICD-10-CM

## 2020-10-18 DIAGNOSIS — M79675 Pain in left toe(s): Secondary | ICD-10-CM | POA: Diagnosis not present

## 2020-10-18 DIAGNOSIS — M79674 Pain in right toe(s): Secondary | ICD-10-CM

## 2020-10-18 NOTE — Progress Notes (Signed)
This patient returns to my office for at risk foot care.  This patient requires this care by a professional since this patient will be at risk due to having diabetes.  This patient is unable to cut nails himself since the patient cannot reach his nails.These nails are painful walking and wearing shoes.  This patient presents for at risk foot care today.  General Appearance  Alert, conversant and in no acute stress.  Vascular  Dorsalis pedis and posterior tibial  pulses are palpable  bilaterally.  Capillary return is within normal limits  bilaterally. Temperature is within normal limits  bilaterally.  Neurologic  Senn-Weinstein monofilament wire test within normal limits  bilaterally. Muscle power within normal limits bilaterally.  Nails Thick disfigured discolored nails with subungual debris  Hallux nails bilaterally. No evidence of bacterial infection or drainage bilaterally.  Orthopedic  No limitations of motion  feet .  No crepitus or effusions noted.  No bony pathology or digital deformities noted.  Skin  normotropic skin with no porokeratosis noted bilaterally.  No signs of infections or ulcers noted.     Onychomycosis  Pain in right toes  Pain in left toes  Consent was obtained for treatment procedures.   Mechanical debridement of nails 1-5  bilaterally performed with a nail nipper.  Filed with dremel without incident.    Return office visit    4 months                  Told patient to return for periodic foot care and evaluation due to potential at risk complications.   Helane Gunther DPM

## 2020-10-22 ENCOUNTER — Encounter (HOSPITAL_COMMUNITY): Payer: Self-pay | Admitting: *Deleted

## 2020-10-22 ENCOUNTER — Emergency Department (HOSPITAL_COMMUNITY)
Admission: EM | Admit: 2020-10-22 | Discharge: 2020-10-22 | Disposition: A | Discharge status: Home / Self Care | Payer: Medicare Other | Attending: Emergency Medicine | Admitting: Emergency Medicine

## 2020-10-22 ENCOUNTER — Emergency Department (HOSPITAL_COMMUNITY): Payer: Medicare Other

## 2020-10-22 ENCOUNTER — Other Ambulatory Visit: Payer: Self-pay

## 2020-10-22 DIAGNOSIS — E162 Hypoglycemia, unspecified: Secondary | ICD-10-CM | POA: Diagnosis not present

## 2020-10-22 DIAGNOSIS — F17210 Nicotine dependence, cigarettes, uncomplicated: Secondary | ICD-10-CM | POA: Diagnosis not present

## 2020-10-22 DIAGNOSIS — I10 Essential (primary) hypertension: Secondary | ICD-10-CM | POA: Insufficient documentation

## 2020-10-22 DIAGNOSIS — W19XXXA Unspecified fall, initial encounter: Secondary | ICD-10-CM | POA: Diagnosis not present

## 2020-10-22 DIAGNOSIS — S0990XA Unspecified injury of head, initial encounter: Secondary | ICD-10-CM | POA: Insufficient documentation

## 2020-10-22 DIAGNOSIS — E039 Hypothyroidism, unspecified: Secondary | ICD-10-CM | POA: Insufficient documentation

## 2020-10-22 DIAGNOSIS — E119 Type 2 diabetes mellitus without complications: Secondary | ICD-10-CM | POA: Diagnosis not present

## 2020-10-22 DIAGNOSIS — M542 Cervicalgia: Secondary | ICD-10-CM | POA: Insufficient documentation

## 2020-10-22 DIAGNOSIS — J449 Chronic obstructive pulmonary disease, unspecified: Secondary | ICD-10-CM | POA: Insufficient documentation

## 2020-10-22 LAB — CBG MONITORING, ED
Glucose-Capillary: 232 mg/dL — ABNORMAL HIGH (ref 70–99)
Glucose-Capillary: 147 mg/dL — ABNORMAL HIGH (ref 70–99)

## 2020-10-22 NOTE — ED Provider Notes (Signed)
Emergency Department Provider Note   I have reviewed the triage vital signs and the nursing notes.   HISTORY  Chief Complaint Hypoglycemia (Intial cbg 50)   HPI Carrie Clark is a 60 y.o. female with past medical history of schizophrenia coming from a group home presents via EMS with hypoglycemia.   She was found on the floor by staff.  Patient tells me she did not eat breakfast this morning but did take her insulin.  EMS arrived and gave her glucose tablets and she became much more responsive.  She is awake and alert on my initial evaluation.  She states she is feeling well.  She does have some abrasions to her forehead and nose and tells me that she had a fall earlier in the week was been having some neck discomfort.  Staff note that she has some drooling and coughing with drinking at times.  No fevers or chills.  Patient denies any chest pain or shortness of breath.  No abdominal discomfort.   Past Medical History:  Diagnosis Date  . COPD (chronic obstructive pulmonary disease) (HCC)   . CVA (cerebral infarction)   . Depression   . Hypertension   . Hypothyroidism   . Schizoaffective disorder, bipolar type (HCC)   . Schizophrenia (HCC)   . Stroke (HCC)   . Tardive dyskinesia   . Tardive dyskinesia     Patient Active Problem List   Diagnosis Date Noted  . Pain due to onychomycosis of toenails of both feet 10/18/2020  . UTI (urinary tract infection) 12/03/2018  . Hypothyroidism 12/03/2018  . HTN (hypertension) 12/03/2018  . COPD (chronic obstructive pulmonary disease) (HCC) 12/03/2018  . Diabetes (HCC) 12/03/2018  . Altered mental status 12/03/2018  . Schizoaffective disorder, bipolar type (HCC) 09/11/2015  . Aggression   . Episode of behavior change   . Psychoses [facility])     Past Surgical History:  Procedure Laterality Date  . NO PAST SURGERIES      Allergies Fish allergy  Family History  Family history unknown: Yes    Social History Social History    Tobacco Use  . Smoking status: Current Every Day Smoker    Packs/day: 1.00    Types: Cigarettes  . Smokeless tobacco: Never Used  Vaping Use  . Vaping Use: Never used  Substance Use Topics  . Alcohol use: No  . Drug use: No    Review of Systems  Constitutional: No fever/chills. Fatigue with low glucose.  Eyes: No visual changes. ENT: No sore throat. Cardiovascular: Denies chest pain. Respiratory: Denies shortness of breath. Gastrointestinal: No abdominal pain.  No nausea, no vomiting.  No diarrhea.  No constipation. Genitourinary: Negative for dysuria. Musculoskeletal: Negative for back pain. Skin: Negative for rash. Abrasion to the head/face.  Neurological: Negative for headaches, focal weakness or numbness.  10-point ROS otherwise negative.  ____________________________________________   PHYSICAL EXAM:  VITAL SIGNS: ED Triage Vitals  Enc Vitals Group     BP 10/22/20 0904 121/68     Pulse Rate 10/22/20 0904 69     Resp 10/22/20 0904 18     Temp 10/22/20 0904 97.6 F (36.4 C)     Temp Source 10/22/20 0904 Oral     SpO2 10/22/20 0904 100 %   Constitutional: Alert and oriented. Well appearing and in no acute distress. Eyes: Conjunctivae are normal. Head: Atraumatic. Nose: No congestion/rhinnorhea. Mouth/Throat: Mucous membranes are moist. No drooling.  Neck: No stridor.  Cardiovascular: Normal rate, regular rhythm. Good peripheral circulation.  Grossly normal heart sounds.   Respiratory: Normal respiratory effort.  No retractions. Lungs CTAB. Gastrointestinal: Soft and nontender. No distention.  Musculoskeletal: No lower extremity tenderness nor edema. No gross deformities of extremities. Neurologic:  Normal speech and language.  Skin:  Skin is warm, dry and intact. No rash noted.   ____________________________________________   LABS (all labs ordered are listed, but only abnormal results are displayed)  Labs Reviewed  CBG MONITORING, ED - Abnormal;  Notable for the following components:      Result Value   Glucose-Capillary 232 (*)    All other components within normal limits  CBG MONITORING, ED - Abnormal; Notable for the following components:   Glucose-Capillary 147 (*)    All other components within normal limits   ____________________________________________  RADIOLOGY  CT Head Wo Contrast  Result Date: 10/22/2020 CLINICAL DATA:  Head trauma, found down EXAM: CT HEAD WITHOUT CONTRAST CT CERVICAL SPINE WITHOUT CONTRAST TECHNIQUE: Multidetector CT imaging of the head and cervical spine was performed following the standard protocol without intravenous contrast. Multiplanar CT image reconstructions of the cervical spine were also generated. COMPARISON:  03/31/2020 FINDINGS: CT HEAD FINDINGS Brain: No evidence of acute infarction, hemorrhage, hydrocephalus, extra-axial collection or mass lesion/mass effect. Vascular: No hyperdense vessel or unexpected calcification. Skull: Normal. Negative for fracture or focal lesion. Sinuses/Orbits: No acute finding. Other: None. CT CERVICAL SPINE FINDINGS Alignment: Degenerative straightening and reversal of the normal cervical lordosis. Skull base and vertebrae: No acute fracture. 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 No primary bone lesion or focal pathologic process. Soft tissues and spinal canal: No prevertebral fluid or swelling. No visible canal hematoma. Disc levels: Moderate multilevel disc space height loss and osteophytosis, with large anterior bridging osteophytes. Upper chest: Negative. Other: None. IMPRESSION: 1. No acute intracranial pathology. 2. No fracture or static subluxation of the cervical spine. 3. Moderate multilevel cervical disc degenerative disease with large anterior bridging osteophytes and reversal of the normal cervical lordosis. Electronically Signed   By: Lauralyn Primes M.D.   On: 10/22/2020 09:43   CT Cervical Spine Wo Contrast  Result Date:  10/22/2020 CLINICAL DATA:  Head trauma, found down EXAM: CT HEAD WITHOUT CONTRAST CT CERVICAL SPINE WITHOUT CONTRAST TECHNIQUE: Multidetector CT imaging of the head and cervical spine was performed following the standard protocol without intravenous contrast. Multiplanar CT image reconstructions of the cervical spine were also generated. COMPARISON:  03/31/2020 FINDINGS: CT HEAD FINDINGS Brain: No evidence of acute infarction, hemorrhage, hydrocephalus, extra-axial collection or mass lesion/mass effect. Vascular: No hyperdense vessel or unexpected calcification. Skull: Normal. Negative for fracture or focal lesion. Sinuses/Orbits: No acute finding. Other: None. CT CERVICAL SPINE FINDINGS Alignment: Degenerative straightening and reversal of the normal cervical lordosis. Skull base and vertebrae: No acute fracture. 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 No primary bone lesion or focal pathologic process. Soft tissues and spinal canal: No prevertebral fluid or swelling. No visible canal hematoma. Disc levels: Moderate multilevel disc space height loss and osteophytosis, with large anterior bridging osteophytes. Upper chest: Negative. Other: None. IMPRESSION: 1. No acute intracranial pathology. 2. No fracture or static subluxation of the cervical spine. 3. Moderate multilevel cervical disc degenerative disease with large anterior bridging osteophytes and reversal of the normal cervical lordosis. Electronically Signed   By: Lauralyn Primes M.D.   On: 10/22/2020 09:43   DG Chest Portable 1 View  Result Date: 10/22/2020 CLINICAL DATA:  60 year old female with history of cough. EXAM: PORTABLE CHEST 1  VIEW COMPARISON:  Chest x-ray 09/06/2020. FINDINGS: Lung volumes are normal. No consolidative airspace disease. No pleural effusions. No pneumothorax. No pulmonary nodule or mass noted. Pulmonary vasculature and the cardiomediastinal silhouette are within normal limits. IMPRESSION: No radiographic  evidence of acute cardiopulmonary disease. Electronically Signed   By: Trudie Reed M.D.   On: 10/22/2020 09:27    ____________________________________________   PROCEDURES  Procedure(s) performed:   Procedures  None  ____________________________________________   INITIAL IMPRESSION / ASSESSMENT AND PLAN / ED COURSE  Pertinent labs & imaging results that were available during my care of the patient were reviewed by me and considered in my medical decision making (see chart for details).   Patient presents to the emergency department after being found hypoglycemic.  She responded to glucose tablets.  Staff reports some coughing at times with drinking fluids.  Patient is not drooling here.  Her oxygen saturation is normal.  She is not febrile.  We will obtain a chest x-ray to evaluate for possible aspiration but low suspicion for this clinically.  Patient also has an abrasion to her forehead and states she fell earlier in the week.  Will obtain a screening CT of the head and cervical spine as she was complaining of some neck discomfort but no specific midline tenderness.  Blood sugars are elevated here on arrival.  She gives a good history of taking her insulin this morning but not being called for breakfast.  Plan for every hour blood sugar checks and reassess.   10:15 AM  Patient is eating and drinking here without difficulty.  She is not having drooling or choking/coughing with eating.  Her chest x-ray does not show evidence of pneumonia or other injury.  CT imaging of the head shows no acute findings.  Blood sugar remains within normal limits to slightly elevated.  Discussed close PCP follow-up planned in the coming week. Patient appears safe for discharge.  ____________________________________________  FINAL CLINICAL IMPRESSION(S) / ED DIAGNOSES  Final diagnoses:  Hypoglycemia  Fall, initial encounter    Note:  This document was prepared using Dragon voice recognition software  and may include unintentional dictation errors.  Alona Bene, MD, [facility]    Judy Pollman, Arlyss Repress, MD 10/22/20 1020

## 2020-10-22 NOTE — ED Triage Notes (Signed)
Pt brought in by ems from group home; pt was found on floor by staff and when ems got there cbg was 50; pt was given 2 oral glucose and 2 cups of OJ, cbg increased to 90; pt is loud and belligerent; pt also c/o neck pain; staff reported to ems that pt drools constantly and has coughing spells when drinking liquids

## 2020-10-22 NOTE — ED Triage Notes (Signed)
Pt has abrasion to middle of forehead and on nose, when asked if pt fell she states she did but is unsure of what day; pt had incontinence of bowel and bladder upon arrival to ED; pt cleaned and gown placed on pt

## 2020-10-22 NOTE — Discharge Instructions (Signed)
You were seen in the emergency department today after your blood sugar dropped.  Please make sure that if you take your insulin in the morning you eat immediately.  Please call your primary care doctor to schedule a close follow-up appointment.  You had some abrasion to your head and we performed a CT scan of your head and neck which were also normal.  Your chest x-ray did not show signs of pneumonia or other injury.

## 2020-10-22 NOTE — ED Notes (Signed)
This RN called Carrie Clark at [facility] for patient discharge, Carrie Clark stated that someone would be here to pick her up in ~ 30 minutes.

## 2020-10-27 ENCOUNTER — Emergency Department (HOSPITAL_COMMUNITY)
Admission: EM | Admit: 2020-10-27 | Discharge: 2020-10-28 | Disposition: A | Discharge status: Home / Self Care | Payer: Medicare Other | Source: Home / Self Care | Attending: Emergency Medicine | Admitting: Emergency Medicine

## 2020-10-27 ENCOUNTER — Emergency Department (HOSPITAL_COMMUNITY)
Admission: EM | Admit: 2020-10-27 | Discharge: 2020-10-27 | Disposition: A | Discharge status: Home / Self Care | Payer: Medicare Other | Attending: Emergency Medicine | Admitting: Emergency Medicine

## 2020-10-27 ENCOUNTER — Other Ambulatory Visit: Payer: Self-pay

## 2020-10-27 ENCOUNTER — Encounter (HOSPITAL_COMMUNITY): Payer: Self-pay | Admitting: *Deleted

## 2020-10-27 ENCOUNTER — Encounter (HOSPITAL_COMMUNITY): Payer: Self-pay

## 2020-10-27 DIAGNOSIS — Z794 Long term (current) use of insulin: Secondary | ICD-10-CM | POA: Insufficient documentation

## 2020-10-27 DIAGNOSIS — Z20822 Contact with and (suspected) exposure to covid-19: Secondary | ICD-10-CM | POA: Insufficient documentation

## 2020-10-27 DIAGNOSIS — S51812A Laceration without foreign body of left forearm, initial encounter: Secondary | ICD-10-CM | POA: Insufficient documentation

## 2020-10-27 DIAGNOSIS — W07XXXA Fall from chair, initial encounter: Secondary | ICD-10-CM | POA: Insufficient documentation

## 2020-10-27 DIAGNOSIS — F6381 Intermittent explosive disorder: Secondary | ICD-10-CM | POA: Diagnosis not present

## 2020-10-27 DIAGNOSIS — W19XXXA Unspecified fall, initial encounter: Secondary | ICD-10-CM

## 2020-10-27 DIAGNOSIS — Z79899 Other long term (current) drug therapy: Secondary | ICD-10-CM | POA: Insufficient documentation

## 2020-10-27 DIAGNOSIS — E119 Type 2 diabetes mellitus without complications: Secondary | ICD-10-CM | POA: Diagnosis not present

## 2020-10-27 DIAGNOSIS — F17210 Nicotine dependence, cigarettes, uncomplicated: Secondary | ICD-10-CM | POA: Insufficient documentation

## 2020-10-27 DIAGNOSIS — J449 Chronic obstructive pulmonary disease, unspecified: Secondary | ICD-10-CM | POA: Insufficient documentation

## 2020-10-27 DIAGNOSIS — S41112A Laceration without foreign body of left upper arm, initial encounter: Secondary | ICD-10-CM

## 2020-10-27 DIAGNOSIS — Z7951 Long term (current) use of inhaled steroids: Secondary | ICD-10-CM | POA: Diagnosis not present

## 2020-10-27 DIAGNOSIS — Y92009 Unspecified place in unspecified non-institutional (private) residence as the place of occurrence of the external cause: Secondary | ICD-10-CM | POA: Insufficient documentation

## 2020-10-27 DIAGNOSIS — E039 Hypothyroidism, unspecified: Secondary | ICD-10-CM | POA: Insufficient documentation

## 2020-10-27 DIAGNOSIS — Z7984 Long term (current) use of oral hypoglycemic drugs: Secondary | ICD-10-CM | POA: Insufficient documentation

## 2020-10-27 DIAGNOSIS — I10 Essential (primary) hypertension: Secondary | ICD-10-CM | POA: Insufficient documentation

## 2020-10-27 DIAGNOSIS — R4689 Other symptoms and signs involving appearance and behavior: Secondary | ICD-10-CM

## 2020-10-27 LAB — COMPREHENSIVE METABOLIC PANEL
ALT: 21 U/L (ref 0–44)
AST: 24 U/L (ref 15–41)
Albumin: 3.5 g/dL (ref 3.5–5.0)
Alkaline Phosphatase: 109 U/L (ref 38–126)
Anion gap: 6 (ref 5–15)
BUN: 30 mg/dL — ABNORMAL HIGH (ref 6–20)
CO2: 34 mmol/L — ABNORMAL HIGH (ref 22–32)
Calcium: 9.2 mg/dL (ref 8.9–10.3)
Chloride: 99 mmol/L (ref 98–111)
Creatinine, Ser: 1.27 mg/dL — ABNORMAL HIGH (ref 0.44–1.00)
GFR, Estimated: 49 mL/min — ABNORMAL LOW (ref >60)
Glucose, Bld: 148 mg/dL — ABNORMAL HIGH (ref 70–99)
Potassium: 4.6 mmol/L (ref 3.5–5.1)
Sodium: 139 mmol/L (ref 135–145)
Total Bilirubin: 0.3 mg/dL (ref 0.3–1.2)
Total Protein: 6.4 g/dL — ABNORMAL LOW (ref 6.5–8.1)

## 2020-10-27 LAB — CBC WITH DIFFERENTIAL/PLATELET
Abs Immature Granulocytes: 0.03 10*3/uL (ref 0.00–0.07)
Basophils Absolute: 0.1 10*3/uL (ref 0.0–0.1)
Basophils Relative: 1 %
Eosinophils Absolute: 0.6 10*3/uL — ABNORMAL HIGH (ref 0.0–0.5)
Eosinophils Relative: 7 %
HCT: 29.9 % — ABNORMAL LOW (ref 36.0–46.0)
Hemoglobin: 8.9 g/dL — ABNORMAL LOW (ref 12.0–15.0)
Immature Granulocytes: 0 %
Lymphocytes Relative: 22 %
Lymphs Abs: 1.9 10*3/uL (ref 0.7–4.0)
MCH: 27.3 pg (ref 26.0–34.0)
MCHC: 29.8 g/dL — ABNORMAL LOW (ref 30.0–36.0)
MCV: 91.7 fL (ref 80.0–100.0)
Monocytes Absolute: 0.8 10*3/uL (ref 0.1–1.0)
Monocytes Relative: 10 %
Neutro Abs: 5 10*3/uL (ref 1.7–7.7)
Neutrophils Relative %: 60 %
Platelets: 290 10*3/uL (ref 150–400)
RBC: 3.26 MIL/uL — ABNORMAL LOW (ref 3.87–5.11)
RDW: 15.4 % (ref 11.5–15.5)
WBC: 8.4 10*3/uL (ref 4.0–10.5)
nRBC: 0 % (ref 0.0–0.2)

## 2020-10-27 LAB — ETHANOL: Alcohol, Ethyl (B): <10 mg/dL (ref <10)

## 2020-10-27 LAB — RESP PANEL BY RT-PCR (FLU A&B, COVID) ARPGX2
Influenza A by PCR: NEGATIVE
Influenza B by PCR: NEGATIVE
SARS Coronavirus 2 by RT PCR: NEGATIVE

## 2020-10-27 NOTE — ED Notes (Signed)
Pt called Carrie Clark at [facility] and asked them if she could return home. They disclosed to the Pt that they will send someone to pick her up tonight and take her back under agreement she does not have any more verbal outbursts. Pt in agreement and understands the facility reports they will turn around and send her back to [facility] if outbursts behavior continues.

## 2020-10-27 NOTE — ED Notes (Signed)
ED Provider at bedside. 

## 2020-10-27 NOTE — ED Provider Notes (Signed)
[id number] [facility] EMERGENCY DEPARTMENT Provider Note   CSN: [id number] Arrival date & time: 10/27/20  1603     History No chief complaint on file.   Carrie Clark is a 60 y.o. female.  HPI The patient was brought in from a group home after verbal altercation with staff.  She indicated to them that she was suicidal so they sent her here.  The patient is calm and interactive and communicative on arrival.  She denies suicidal ideation or plan at this time.  She admits that she was arguing with staff members.  She denies recent illnesses.  There are no other known modifying factors.  The patient is not under involuntary petition commitment on arrival.    Past Medical History:  Diagnosis Date  . COPD (chronic obstructive pulmonary disease) (HCC)   . CVA (cerebral infarction)   . Depression   . Hypertension   . Hypothyroidism   . Schizoaffective disorder, bipolar type (HCC)   . Schizophrenia (HCC)   . Stroke (HCC)   . Tardive dyskinesia   . Tardive dyskinesia     Patient Active Problem List   Diagnosis Date Noted  . Pain due to onychomycosis of toenails of both feet 10/18/2020  . UTI (urinary tract infection) 12/03/2018  . Hypothyroidism 12/03/2018  . HTN (hypertension) 12/03/2018  . COPD (chronic obstructive pulmonary disease) (HCC) 12/03/2018  . Diabetes (HCC) 12/03/2018  . Altered mental status 12/03/2018  . Schizoaffective disorder, bipolar type (HCC) 09/11/2015  . Aggression   . Episode of behavior change   . Psychoses [facility])     Past Surgical History:  Procedure Laterality Date  . NO PAST SURGERIES       OB History   No obstetric history on file.     Family History  Family history unknown: Yes    Social History   Tobacco Use  . Smoking status: Current Every Day Smoker    Packs/day: 1.00    Types: Cigarettes  . Smokeless tobacco: Never Used  Vaping Use  . Vaping Use: Never used  Substance Use Topics  . Alcohol use: No  . Drug use: No    Home  Medications Prior to Admission medications   Medication Sig Start Date End Date Taking? Authorizing Provider  acetaminophen (TYLENOL) 325 MG tablet Take 2 tablets (650 mg total) by mouth every 6 (six) hours as needed for moderate pain. 01/29/19   Mesner, Barbara Cower, MD  albuterol (PROVENTIL HFA;VENTOLIN HFA) 108 (90 Base) MCG/ACT inhaler Inhale 2 puffs into the lungs every 6 (six) hours as needed for wheezing or shortness of breath. Patient taking differently: Inhale 2 puffs into the lungs every 4 (four) hours as needed for wheezing or shortness of breath.  06/07/15   Mesner, Barbara Cower, MD  atorvastatin (LIPITOR) 40 MG tablet Take 40 mg by mouth at bedtime.     [provider]  benztropine (COGENTIN) 1 MG tablet Take 1 mg by mouth 2 (two) times daily.     [provider]  cetirizine (ZYRTEC) 10 MG tablet Take 1 tablet (10 mg total) by mouth daily. 11/11/14   Withrow, Everardo All, FNP  Deutetrabenazine (AUSTEDO) 12 MG TABS Take 12 mg by mouth in the morning and at bedtime.    [provider]  diphenhydrAMINE (BENADRYL ALLERGY) 25 MG tablet Take 1 tablet (25 mg total) by mouth at bedtime. 11/17/19 12/17/19  Concha Se, MD  donepezil (ARICEPT) 5 MG tablet Take 5 mg by mouth at bedtime.    [provider]  DULoxetine (CYMBALTA) 30 MG capsule Take 1 capsule (30 mg total) by mouth daily. 11/17/19 12/17/19  Concha Se, MD  famotidine (PEPCID) 20 MG tablet Take 1 tablet (20 mg total) by mouth 2 (two) times daily. 11/11/14   Withrow, Everardo All, FNP  furosemide (LASIX) 20 MG tablet Take 1 tablet (20 mg total) by mouth daily. 11/11/14   Withrow, Everardo All, FNP  haloperidol (HALDOL) 0.5 MG tablet Take 1 tablet (0.5 mg total) by mouth every morning. 11/17/19 12/17/19  Concha Se, MD  haloperidol (HALDOL) 2 MG tablet Take 2 tablets (4 mg total) by mouth at bedtime. 11/17/19 12/17/19  Concha Se, MD  haloperidol decanoate (HALDOL DECANOATE) 100 MG/ML injection Inject 0.25 mLs (25 mg total) into the  muscle every 28 (twenty-eight) days for 1 day. 12/17/19 12/18/19  Concha Se, MD  Insulin Detemir (LEVEMIR) 100 UNIT/ML Pen Inject 20 Units into the skin 2 (two) times daily.     [provider]  levothyroxine (SYNTHROID) 100 MCG tablet Take 100 mcg by mouth daily before breakfast.     [provider]  lisinopril (PRINIVIL,ZESTRIL) 5 MG tablet Take 5 mg by mouth daily.    [provider]  metFORMIN (GLUCOPHAGE) 1000 MG tablet Take 1 tablet (1,000 mg total) by mouth 2 (two) times daily. 09/11/15   Charm Rings, NP  Multiple Vitamins-Minerals (MULTIVITAMIN WITH MINERALS) tablet Take 1 tablet by mouth daily. 11/11/14   Withrow, Everardo All, FNP  potassium chloride (K-DUR,KLOR-CON) 10 MEQ tablet Take 1 tablet (10 mEq total) by mouth daily. 11/11/14   Withrow, Everardo All, FNP  tiotropium (SPIRIVA) 18 MCG inhalation capsule Place 18 mcg into inhaler and inhale daily.    [provider]  fluPHENAZine (PROLIXIN) 10 MG tablet Take 1 tablet (10 mg total) by mouth 3 (three) times daily. 12/03/18 11/17/19  Adrian Saran, MD  fluPHENAZine decanoate (PROLIXIN) 25 MG/ML injection Inject 50 mg into the muscle every 28 (twenty-eight) days.  11/17/19  [provider]  lurasidone (LATUDA) 20 MG TABS tablet Take 20 mg by mouth daily in the afternoon.   11/17/19  [provider]  Oxcarbazepine (TRILEPTAL) 300 MG tablet Take 1 tablet (300 mg total) by mouth 2 (two) times daily. 09/11/15 11/17/19  Charm Rings, NP  prazosin (MINIPRESS) 1 MG capsule Take 1 mg by mouth at bedtime.  11/17/19  [provider]  zolpidem (AMBIEN) 5 MG tablet Take 5 mg by mouth at bedtime.  11/17/19  [provider]    Allergies    Fish allergy  Review of Systems   Review of Systems  All other systems reviewed and are negative.   Physical Exam Updated Vital Signs BP (!) 147/83 (BP Location: Right Arm)   Pulse 82   Temp 98.3 F (36.8 C) (Oral)   Resp 16   SpO2 93%   Physical  Exam Vitals and nursing note reviewed.  Constitutional:      Appearance: She is well-developed.  HENT:     Head: Normocephalic and atraumatic.     Right Ear: External ear normal.     Left Ear: External ear normal.  Eyes:     Conjunctiva/sclera: Conjunctivae normal.     Pupils: Pupils are equal, round, and reactive to light.  Neck:     Trachea: Phonation normal.  Cardiovascular:     Rate and Rhythm: Normal rate.  Pulmonary:     Effort: Pulmonary effort is normal.  Abdominal:  General: There is no distension.  Musculoskeletal:        General: Normal range of motion.     Cervical back: Normal range of motion and neck supple.  Skin:    General: Skin is warm and dry.  Neurological:     Mental Status: She is alert.     Cranial Nerves: No cranial nerve deficit.     Sensory: No sensory deficit.     Motor: No abnormal muscle tone.     Coordination: Coordination normal.  Psychiatric:        Mood and Affect: Mood normal.        Behavior: Behavior normal.        Thought Content: Thought content normal.        Judgment: Judgment normal.     ED Results / Procedures / Treatments   Labs (all labs ordered are listed, but only abnormal results are displayed) Labs Reviewed - No data to display  EKG None  Radiology No results found.  Procedures Procedures   Medications Ordered in ED Medications - No data to display  ED Course  I have reviewed the triage vital signs and the nursing notes.  Pertinent labs & imaging results that were available during my care of the patient were reviewed by me and considered in my medical decision making (see chart for details).    MDM Rules/Calculators/A&P                           Patient Vitals for the past 24 hrs:  BP Temp Temp src Pulse Resp SpO2  10/27/20 1654 (!) 147/83 98.3 F (36.8 C) Oral 82 16 93 %    8:07 PM Reevaluation with update and discussion. After initial assessment and treatment, an updated evaluation reveals she  denies suicidal ideation now.  She states she will not argue with the group home people when she returns.  Arrangements being made for discharge.  I have discussed the findings and plans with the patient's legal guardian, Thelma Barge, her sister. Mancel Bale   Medical Decision Making:  This patient is presenting for evaluation of argumentative behavior and stated suicidal intent, which does require a range of treatment options, and is a complaint that involves a moderate risk of morbidity and mortality. The differential diagnoses include acting out, suicidal ideation, chronic behavioral disorder. I decided to review old records, and in summary middle-aged female who lives in a group home, presented to ED after argument.  I obtained additional historical information from Katherina Mires on telephone.      Critical Interventions-clinical evaluation, observation reassessment  After These Interventions, the Patient was reevaluated and was found with recurrent chronic acting out behavior, stable for discharge without concern for suicidal attempt.  CRITICAL CARE-no Performed by: Mancel Bale  Nursing Notes Reviewed/ Care Coordinated Applicable Imaging Reviewed Interpretation of Laboratory Data incorporated into ED treatment  The patient appears reasonably screened and/or stabilized for discharge and I doubt any other medical condition or other [facility] requiring further screening, evaluation, or treatment in the ED at this time prior to discharge.  Plan: Home Medications-continue usual; Home Treatments-regular diet; return here if the recommended treatment, does not improve the symptoms; Recommended follow up-PCP, as needed     Final Clinical Impression(s) / ED Diagnoses Final diagnoses:  Outbursts of explosive behavior    Rx / DC Orders ED Discharge Orders    None       Mancel Bale, MD 10/28/20  1121  

## 2020-10-27 NOTE — ED Notes (Signed)
Pt states she is in the ED because she was suggesting suicide today in her group home. The patient states her plan would be to cut her wrist.  Pt states she felt that way today because she " hates her life."

## 2020-10-27 NOTE — ED Notes (Signed)
Meal provided to the patient  

## 2020-10-27 NOTE — ED Notes (Signed)
Charge RN called [facility] to give report on Pt. Group Home Report they are not going to accept Pt back to their facility following discharge. Charge RN placed consult to SW/TOC.

## 2020-10-27 NOTE — ED Triage Notes (Addendum)
Brought in for IVC, PATIENT IS SUICIDAL

## 2020-10-27 NOTE — ED Triage Notes (Signed)
BIB EMS following fall trying to get into a chair at group home. Injury to L arm.

## 2020-10-27 NOTE — ED Notes (Signed)
Pt denying SI at this time. Pt provided warm blanket and new mask at this time.

## 2020-10-28 DIAGNOSIS — F6381 Intermittent explosive disorder: Secondary | ICD-10-CM | POA: Diagnosis not present

## 2020-10-28 MED ORDER — LIDOCAINE HCL (PF) 1 % IJ SOLN
INTRAMUSCULAR | Status: AC
  Filled 2020-10-28: qty 30

## 2020-10-28 MED ORDER — LIDOCAINE-EPINEPHRINE (PF) 1 %-1:200000 IJ SOLN
10.0000 mL | Freq: Once | INTRAMUSCULAR | Status: AC
  Administered 2020-10-28: 10 mL via INTRADERMAL

## 2020-10-28 MED ORDER — BACITRACIN-NEOMYCIN-POLYMYXIN 400-5-5000 EX OINT: 1.0000 "application " | TOPICAL_OINTMENT | Freq: Every day | CUTANEOUS | 0 refills | Status: AC

## 2020-10-28 MED ORDER — DOUBLE ANTIBIOTIC 500-10000 UNIT/GM EX OINT
TOPICAL_OINTMENT | Freq: Once | CUTANEOUS | Status: AC
  Administered 2020-10-28: 1 via TOPICAL
  Filled 2020-10-28: qty 1

## 2020-10-28 NOTE — ED Notes (Signed)
ED Provider at bedside. 

## 2020-10-28 NOTE — Discharge Instructions (Addendum)
I put absorbable sutures in your arm. You do NOT need to come back to have those removed, they will slowly dissolve.   Please change the dressing and apply antibiotic ointment daily for 7 days.

## 2020-10-29 NOTE — ED Provider Notes (Signed)
[facility] EMERGENCY DEPARTMENT Provider Note   CSN: [id number] Arrival date & time: 10/27/20  2338     History Chief Complaint  Patient presents with  . Fall    Carrie Clark is a 60 y.o. female.  HPI Discharged from here earlier today. Went home, fell down and a chair caused a laceration to her left arm. Chair hit her in the head but no syncope, headache or other associated symptoms.     Past Medical History:  Diagnosis Date  . COPD (chronic obstructive pulmonary disease) (HCC)   . CVA (cerebral infarction)   . Depression   . Hypertension   . Hypothyroidism   . Schizoaffective disorder, bipolar type (HCC)   . Schizophrenia (HCC)   . Stroke (HCC)   . Tardive dyskinesia   . Tardive dyskinesia     Patient Active Problem List   Diagnosis Date Noted  . Pain due to onychomycosis of toenails of both feet 10/18/2020  . UTI (urinary tract infection) 12/03/2018  . Hypothyroidism 12/03/2018  . HTN (hypertension) 12/03/2018  . COPD (chronic obstructive pulmonary disease) (HCC) 12/03/2018  . Diabetes (HCC) 12/03/2018  . Altered mental status 12/03/2018  . Schizoaffective disorder, bipolar type (HCC) 09/11/2015  . Aggression   . Episode of behavior change   . Psychoses [facility])     Past Surgical History:  Procedure Laterality Date  . NO PAST SURGERIES       OB History   No obstetric history on file.     Family History  Family history unknown: Yes    Social History   Tobacco Use  . Smoking status: Current Every Day Smoker    Packs/day: 1.00    Types: Cigarettes  . Smokeless tobacco: Never Used  Vaping Use  . Vaping Use: Never used  Substance Use Topics  . Alcohol use: No  . Drug use: No    Home Medications Prior to Admission medications   Medication Sig Start Date End Date Taking? Authorizing Provider  neomycin-bacitracin-polymyxin (NEOSPORIN) ointment Apply 1 application topically daily for 7 days. To wounds on arm 10/28/20 11/04/20 Yes Rontae Inglett, Barbara Cower, MD   acetaminophen (TYLENOL) 325 MG tablet Take 2 tablets (650 mg total) by mouth every 6 (six) hours as needed for moderate pain. 01/29/19   Aaralynn Shepheard, Barbara Cower, MD  albuterol (PROVENTIL HFA;VENTOLIN HFA) 108 (90 Base) MCG/ACT inhaler Inhale 2 puffs into the lungs every 6 (six) hours as needed for wheezing or shortness of breath. Patient taking differently: Inhale 2 puffs into the lungs every 4 (four) hours as needed for wheezing or shortness of breath.  06/07/15   Emon Miggins, Barbara Cower, MD  atorvastatin (LIPITOR) 40 MG tablet Take 40 mg by mouth at bedtime.     [provider]  benztropine (COGENTIN) 1 MG tablet Take 1 mg by mouth 2 (two) times daily.     [provider]  cetirizine (ZYRTEC) 10 MG tablet Take 1 tablet (10 mg total) by mouth daily. 11/11/14   Withrow, Everardo All, FNP  Deutetrabenazine (AUSTEDO) 12 MG TABS Take 12 mg by mouth in the morning and at bedtime.    [provider]  diphenhydrAMINE (BENADRYL ALLERGY) 25 MG tablet Take 1 tablet (25 mg total) by mouth at bedtime. 11/17/19 12/17/19  Concha Se, MD  donepezil (ARICEPT) 5 MG tablet Take 5 mg by mouth at bedtime.    [provider]  DULoxetine (CYMBALTA) 30 MG capsule Take 1 capsule (30 mg total) by mouth daily. 11/17/19 12/17/19  Artis Delay  E, MD  famotidine (PEPCID) 20 MG tablet Take 1 tablet (20 mg total) by mouth 2 (two) times daily. 11/11/14   Withrow, Everardo All, FNP  furosemide (LASIX) 20 MG tablet Take 1 tablet (20 mg total) by mouth daily. 11/11/14   Withrow, Everardo All, FNP  haloperidol (HALDOL) 0.5 MG tablet Take 1 tablet (0.5 mg total) by mouth every morning. 11/17/19 12/17/19  Concha Se, MD  haloperidol (HALDOL) 2 MG tablet Take 2 tablets (4 mg total) by mouth at bedtime. 11/17/19 12/17/19  Concha Se, MD  haloperidol decanoate (HALDOL DECANOATE) 100 MG/ML injection Inject 0.25 mLs (25 mg total) into the muscle every 28 (twenty-eight) days for 1 day. 12/17/19 12/18/19  Concha Se, MD  Insulin Detemir (LEVEMIR)  100 UNIT/ML Pen Inject 20 Units into the skin 2 (two) times daily.     [provider]  levothyroxine (SYNTHROID) 100 MCG tablet Take 100 mcg by mouth daily before breakfast.     [provider]  lisinopril (PRINIVIL,ZESTRIL) 5 MG tablet Take 5 mg by mouth daily.    [provider]  metFORMIN (GLUCOPHAGE) 1000 MG tablet Take 1 tablet (1,000 mg total) by mouth 2 (two) times daily. 09/11/15   Charm Rings, NP  Multiple Vitamins-Minerals (MULTIVITAMIN WITH MINERALS) tablet Take 1 tablet by mouth daily. 11/11/14   Withrow, Everardo All, FNP  potassium chloride (K-DUR,KLOR-CON) 10 MEQ tablet Take 1 tablet (10 mEq total) by mouth daily. 11/11/14   Withrow, Everardo All, FNP  tiotropium (SPIRIVA) 18 MCG inhalation capsule Place 18 mcg into inhaler and inhale daily.    [provider]  fluPHENAZine (PROLIXIN) 10 MG tablet Take 1 tablet (10 mg total) by mouth 3 (three) times daily. 12/03/18 11/17/19  Adrian Saran, MD  fluPHENAZine decanoate (PROLIXIN) 25 MG/ML injection Inject 50 mg into the muscle every 28 (twenty-eight) days.  11/17/19  [provider]  lurasidone (LATUDA) 20 MG TABS tablet Take 20 mg by mouth daily in the afternoon.   11/17/19  [provider]  Oxcarbazepine (TRILEPTAL) 300 MG tablet Take 1 tablet (300 mg total) by mouth 2 (two) times daily. 09/11/15 11/17/19  Charm Rings, NP  prazosin (MINIPRESS) 1 MG capsule Take 1 mg by mouth at bedtime.  11/17/19  [provider]  zolpidem (AMBIEN) 5 MG tablet Take 5 mg by mouth at bedtime.  11/17/19  [provider]    Allergies    Fish allergy  Review of Systems   Review of Systems  All other systems reviewed and are negative.   Physical Exam Updated Vital Signs BP 124/72 (BP Location: Left Arm)   Pulse 61   Temp 98.1 F (36.7 C) (Oral)   Resp 17   Ht 5\' 4"  (1.626 m)   Wt 72.6 kg   SpO2 94%   BMI 27.47 kg/m   Physical Exam Vitals and nursing note reviewed.  Constitutional:       Appearance: She is well-developed.  HENT:     Head: Normocephalic and atraumatic.     Mouth/Throat:     Mouth: Mucous membranes are moist.     Pharynx: Oropharynx is clear.  Eyes:     Pupils: Pupils are equal, round, and reactive to light.  Cardiovascular:     Rate and Rhythm: Normal rate and regular rhythm.  Pulmonary:     Effort: No respiratory distress.     Breath sounds: No stridor.  Abdominal:     General: Abdomen is flat. There is  no distension.  Musculoskeletal:        General: No swelling or tenderness. Normal range of motion.     Cervical back: Normal range of motion.  Skin:    General: Skin is warm and dry.     Comments: 6 cm laceration to left forearm 1 cm laceration to left forearm proximal to larger one Abrasion to left elbow Abrasion to mid forehead  Neurological:     General: No focal deficit present.     Mental Status: She is alert.     ED Results / Procedures / Treatments   Labs (all labs ordered are listed, but only abnormal results are displayed) Labs Reviewed - No data to display  EKG None  Radiology No results found.  Procedures .Marland KitchenLaceration Repair  Date/Time: 10/29/2020 12:27 AM Performed by: Marily Memos, MD Authorized by: Marily Memos, MD   Consent:    Consent obtained:  Verbal   Consent given by:  Patient   Risks discussed:  Infection, need for additional repair, nerve damage, poor wound healing, poor cosmetic result, pain, retained foreign body, tendon damage and vascular damage   Alternatives discussed:  No treatment, delayed treatment and observation Universal protocol:    Procedure explained and questions answered to patient or proxy's satisfaction: yes     Relevant documents present and verified: yes     Test results available: yes     Patient identity confirmed:  Verbally with patient and hospital-assigned identification number Anesthesia:    Anesthesia method:  Local infiltration   Local anesthetic:  Lidocaine 1% WITH  epi Laceration details:    Location:  Shoulder/arm   Shoulder/arm location:  L lower arm   Length (cm):  6   Depth (mm):  3 Pre-procedure details:    Preparation:  Patient was prepped and draped in usual sterile fashion and imaging obtained to evaluate for foreign bodies Exploration:    Wound exploration: wound explored through full range of motion     Contaminated: no   Treatment:    Area cleansed with:  Saline   Amount of cleaning:  Extensive   Irrigation solution:  Sterile water   Irrigation volume:  200   Irrigation method:  Syringe   Visualized foreign bodies/material removed: no     Debridement:  None   Undermining:  None   Scar revision: no   Skin repair:    Repair method:  Sutures   Suture size:  5-0   Suture material:  Fast-absorbing gut   Suture technique:  Simple interrupted   Number of sutures:  7 Approximation:    Approximation:  Close Repair type:    Repair type:  Simple Post-procedure details:    Dressing:  Antibiotic ointment, non-adherent dressing and tube gauze   Procedure completion:  Tolerated well, no immediate complications .Marland KitchenLaceration Repair  Date/Time: 10/29/2020 12:29 AM Performed by: Marily Memos, MD Authorized by: Marily Memos, MD   Consent:    Consent obtained:  Verbal   Consent given by:  Patient   Risks discussed:  Infection, need for additional repair, nerve damage, poor wound healing, poor cosmetic result, pain, retained foreign body, tendon damage and vascular damage   Alternatives discussed:  No treatment, delayed treatment and observation Universal protocol:    Procedure explained and questions answered to patient or proxy's satisfaction: yes     Relevant documents present and verified: yes     Test results available: yes     Patient identity confirmed:  Verbally with patient and hospital-assigned  identification number Anesthesia:    Anesthesia method:  Local infiltration   Local anesthetic:  Lidocaine 1% WITH epi Laceration  details:    Location:  Shoulder/arm   Shoulder/arm location:  L lower arm   Length (cm):  1   Depth (mm):  2 Pre-procedure details:    Preparation:  Patient was prepped and draped in usual sterile fashion and imaging obtained to evaluate for foreign bodies Exploration:    Limited defect created (wound extended): no     Wound exploration: wound explored through full range of motion     Contaminated: no   Treatment:    Area cleansed with:  Saline   Amount of cleaning:  Extensive   Irrigation solution:  Sterile water   Irrigation volume:  150   Irrigation method:  Syringe   Visualized foreign bodies/material removed: no     Debridement:  None   Undermining:  None   Scar revision: no   Skin repair:    Repair method:  Sutures   Suture size:  5-0   Suture material:  Chromic gut   Suture technique:  Simple interrupted   Number of sutures:  2 Approximation:    Approximation:  Close Repair type:    Repair type:  Simple Post-procedure details:    Dressing:  Antibiotic ointment and non-adherent dressing   Procedure completion:  Tolerated well, no immediate complications     Medications Ordered in ED Medications  lidocaine-EPINEPHrine (XYLOCAINE-EPINEPHrine) 1 %-1:200000 (PF) injection 10 mL (10 mLs Intradermal Given 10/28/20 0101)  polymixin-bacitracin (POLYSPORIN) ointment (1 application Topical Given 10/28/20 0321)    ED Course  I have reviewed the triage vital signs and the nursing notes.  Pertinent labs & imaging results that were available during my care of the patient were reviewed by me and considered in my medical decision making (see chart for details).    MDM Rules/Calculators/A&P                          Abrasions cleaned and dried. Wounds repaired as above. Here for multiple hours without headache, AMS, emesis or other concern for head injury and patient didn't syncopize, no imaging indicated.   Final Clinical Impression(s) / ED Diagnoses Final diagnoses:  Fall,  initial encounter  Laceration of left upper extremity, initial encounter    Rx / DC Orders ED Discharge Orders         Ordered    neomycin-bacitracin-polymyxin (NEOSPORIN) ointment  Daily        10/28/20 0317           Marily MemosMesner, Lendy Dittrich, MD 10/29/20 0031

## 2020-12-29 ENCOUNTER — Emergency Department (HOSPITAL_COMMUNITY)
Admission: EM | Admit: 2020-12-29 | Discharge: 2020-12-31 | Disposition: A | Discharge status: Other Acute Inpatient Hospital | Payer: Medicare Other | Attending: Emergency Medicine | Admitting: Emergency Medicine

## 2020-12-29 ENCOUNTER — Encounter (HOSPITAL_COMMUNITY): Payer: Self-pay | Admitting: *Deleted

## 2020-12-29 ENCOUNTER — Other Ambulatory Visit: Payer: Self-pay

## 2020-12-29 DIAGNOSIS — R45851 Suicidal ideations: Secondary | ICD-10-CM | POA: Insufficient documentation

## 2020-12-29 DIAGNOSIS — Z79899 Other long term (current) drug therapy: Secondary | ICD-10-CM | POA: Insufficient documentation

## 2020-12-29 DIAGNOSIS — I10 Essential (primary) hypertension: Secondary | ICD-10-CM | POA: Insufficient documentation

## 2020-12-29 DIAGNOSIS — F209 Schizophrenia, unspecified: Secondary | ICD-10-CM | POA: Diagnosis not present

## 2020-12-29 DIAGNOSIS — R45850 Homicidal ideations: Secondary | ICD-10-CM | POA: Diagnosis not present

## 2020-12-29 DIAGNOSIS — Z20822 Contact with and (suspected) exposure to covid-19: Secondary | ICD-10-CM | POA: Diagnosis not present

## 2020-12-29 DIAGNOSIS — E039 Hypothyroidism, unspecified: Secondary | ICD-10-CM | POA: Insufficient documentation

## 2020-12-29 DIAGNOSIS — F22 Delusional disorders: Secondary | ICD-10-CM | POA: Diagnosis not present

## 2020-12-29 DIAGNOSIS — E119 Type 2 diabetes mellitus without complications: Secondary | ICD-10-CM | POA: Insufficient documentation

## 2020-12-29 DIAGNOSIS — R451 Restlessness and agitation: Secondary | ICD-10-CM | POA: Diagnosis present

## 2020-12-29 DIAGNOSIS — Z7984 Long term (current) use of oral hypoglycemic drugs: Secondary | ICD-10-CM | POA: Diagnosis not present

## 2020-12-29 DIAGNOSIS — F17210 Nicotine dependence, cigarettes, uncomplicated: Secondary | ICD-10-CM | POA: Insufficient documentation

## 2020-12-29 DIAGNOSIS — F23 Brief psychotic disorder: Secondary | ICD-10-CM

## 2020-12-29 DIAGNOSIS — Z794 Long term (current) use of insulin: Secondary | ICD-10-CM | POA: Diagnosis not present

## 2020-12-29 DIAGNOSIS — J449 Chronic obstructive pulmonary disease, unspecified: Secondary | ICD-10-CM | POA: Diagnosis not present

## 2020-12-29 LAB — CBC WITH DIFFERENTIAL/PLATELET
Abs Immature Granulocytes: 0.03 10*3/uL (ref 0.00–0.07)
Basophils Absolute: 0.1 10*3/uL (ref 0.0–0.1)
Basophils Relative: 1 %
Eosinophils Absolute: 0.3 10*3/uL (ref 0.0–0.5)
Eosinophils Relative: 4 %
HCT: 31.5 % — ABNORMAL LOW (ref 36.0–46.0)
Hemoglobin: 9.7 g/dL — ABNORMAL LOW (ref 12.0–15.0)
Immature Granulocytes: 0 %
Lymphocytes Relative: 25 %
Lymphs Abs: 2 10*3/uL (ref 0.7–4.0)
MCH: 26.5 pg (ref 26.0–34.0)
MCHC: 30.8 g/dL (ref 30.0–36.0)
MCV: 86.1 fL (ref 80.0–100.0)
Monocytes Absolute: 0.5 10*3/uL (ref 0.1–1.0)
Monocytes Relative: 6 %
Neutro Abs: 5.1 10*3/uL (ref 1.7–7.7)
Neutrophils Relative %: 64 %
Platelets: 352 10*3/uL (ref 150–400)
RBC: 3.66 MIL/uL — ABNORMAL LOW (ref 3.87–5.11)
RDW: 15.9 % — ABNORMAL HIGH (ref 11.5–15.5)
WBC: 8 10*3/uL (ref 4.0–10.5)
nRBC: 0 % (ref 0.0–0.2)

## 2020-12-29 LAB — URINALYSIS, ROUTINE W REFLEX MICROSCOPIC
Bilirubin Urine: NEGATIVE
Glucose, UA: 150 mg/dL — AB
Hgb urine dipstick: NEGATIVE
Ketones, ur: NEGATIVE mg/dL
Leukocytes,Ua: NEGATIVE
Nitrite: NEGATIVE
Protein, ur: NEGATIVE mg/dL
Specific Gravity, Urine: 1.006 (ref 1.005–1.030)
pH: 6 (ref 5.0–8.0)

## 2020-12-29 LAB — COMPREHENSIVE METABOLIC PANEL
ALT: 20 U/L (ref 0–44)
AST: 22 U/L (ref 15–41)
Albumin: 3.5 g/dL (ref 3.5–5.0)
Alkaline Phosphatase: 107 U/L (ref 38–126)
Anion gap: 8 (ref 5–15)
BUN: 23 mg/dL — ABNORMAL HIGH (ref 6–20)
CO2: 29 mmol/L (ref 22–32)
Calcium: 9.2 mg/dL (ref 8.9–10.3)
Chloride: 96 mmol/L — ABNORMAL LOW (ref 98–111)
Creatinine, Ser: 1.32 mg/dL — ABNORMAL HIGH (ref 0.44–1.00)
GFR, Estimated: 47 mL/min — ABNORMAL LOW (ref >60)
Glucose, Bld: 95 mg/dL (ref 70–99)
Potassium: 4.4 mmol/L (ref 3.5–5.1)
Sodium: 133 mmol/L — ABNORMAL LOW (ref 135–145)
Total Bilirubin: 0.5 mg/dL (ref 0.3–1.2)
Total Protein: 6.7 g/dL (ref 6.5–8.1)

## 2020-12-29 LAB — RAPID URINE DRUG SCREEN, HOSP PERFORMED
Amphetamines: NOT DETECTED
Barbiturates: NOT DETECTED
Benzodiazepines: POSITIVE — AB
Cocaine: NOT DETECTED
Opiates: NOT DETECTED
Tetrahydrocannabinol: NOT DETECTED

## 2020-12-29 LAB — RESP PANEL BY RT-PCR (FLU A&B, COVID) ARPGX2
Influenza A by PCR: NEGATIVE
Influenza B by PCR: NEGATIVE
SARS Coronavirus 2 by RT PCR: NEGATIVE

## 2020-12-29 LAB — ETHANOL: Alcohol, Ethyl (B): <10 mg/dL (ref <10)

## 2020-12-29 MED ORDER — DIAZEPAM 2 MG PO TABS
2.0000 mg | ORAL_TABLET | Freq: Once | ORAL | Status: AC
  Administered 2020-12-29: 2 mg via ORAL
  Filled 2020-12-29: qty 1

## 2020-12-29 MED ORDER — ZIPRASIDONE MESYLATE 20 MG IM SOLR
INTRAMUSCULAR | Status: AC
  Filled 2020-12-29: qty 20

## 2020-12-29 MED ORDER — ZIPRASIDONE MESYLATE 20 MG IM SOLR
10.0000 mg | Freq: Once | INTRAMUSCULAR | Status: AC
  Administered 2020-12-29: 10 mg via INTRAMUSCULAR

## 2020-12-29 MED ORDER — ACETAMINOPHEN 325 MG PO TABS: 650.0000 mg | ORAL_TABLET | ORAL | Status: DC | PRN

## 2020-12-29 MED ORDER — ZIPRASIDONE MESYLATE 20 MG IM SOLR: 20.0000 mg | Freq: Once | INTRAMUSCULAR | Status: DC

## 2020-12-29 MED ORDER — ONDANSETRON HCL 4 MG PO TABS: 4.0000 mg | ORAL_TABLET | Freq: Three times a day (TID) | ORAL | Status: DC | PRN

## 2020-12-29 MED ORDER — STERILE WATER FOR INJECTION IJ SOLN
INTRAMUSCULAR | Status: AC
  Filled 2020-12-29: qty 10

## 2020-12-29 NOTE — ED Notes (Signed)
Charge nurse stated patient IVC
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
## 2020-12-29 NOTE — ED Notes (Signed)
Patient changed into hospital scrubs.

## 2020-12-29 NOTE — ED Notes (Signed)
Patient in bed at this time.

## 2020-12-29 NOTE — ED Notes (Signed)
Pt verbalized she feels better and is ready to go.
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
## 2020-12-29 NOTE — ED Notes (Signed)
Pt is upset because she can not just go home. Pt is yelling " It is my home, I want to go home".

## 2020-12-30 DIAGNOSIS — F209 Schizophrenia, unspecified: Secondary | ICD-10-CM | POA: Diagnosis not present

## 2020-12-30 MED ORDER — ADULT MULTIVITAMIN W/MINERALS CH
1.0000 | ORAL_TABLET | Freq: Every day | ORAL | Status: DC
  Administered 2020-12-30 – 2020-12-31 (×2): 1 via ORAL
  Filled 2020-12-30 (×2): qty 1

## 2020-12-30 MED ORDER — ATORVASTATIN CALCIUM 40 MG PO TABS
40.0000 mg | ORAL_TABLET | Freq: Every day | ORAL | Status: DC
  Filled 2020-12-30: qty 1

## 2020-12-30 MED ORDER — LORAZEPAM 1 MG PO TABS
1.0000 mg | ORAL_TABLET | Freq: Three times a day (TID) | ORAL | Status: DC
  Administered 2020-12-30 – 2020-12-31 (×5): 1 mg via ORAL
  Filled 2020-12-30 (×6): qty 1

## 2020-12-30 MED ORDER — TIOTROPIUM BROMIDE MONOHYDRATE 18 MCG IN CAPS: 18.0000 ug | ORAL_CAPSULE | Freq: Every day | RESPIRATORY_TRACT | Status: DC

## 2020-12-30 MED ORDER — LISINOPRIL 5 MG PO TABS
5.0000 mg | ORAL_TABLET | Freq: Every day | ORAL | Status: DC
  Administered 2020-12-30 – 2020-12-31 (×2): 5 mg via ORAL
  Filled 2020-12-30 (×2): qty 1

## 2020-12-30 MED ORDER — OXCARBAZEPINE 300 MG PO TABS
300.0000 mg | ORAL_TABLET | Freq: Two times a day (BID) | ORAL | Status: DC
  Administered 2020-12-30 – 2020-12-31 (×2): 300 mg via ORAL
  Filled 2020-12-30 (×4): qty 1

## 2020-12-30 MED ORDER — DONEPEZIL HCL 5 MG PO TABS
5.0000 mg | ORAL_TABLET | Freq: Every day | ORAL | Status: DC
  Filled 2020-12-30: qty 1

## 2020-12-30 MED ORDER — MELATONIN 3 MG SL SUBL: 10.0000 | SUBLINGUAL_TABLET | Freq: Every day | SUBLINGUAL | Status: DC

## 2020-12-30 MED ORDER — DEUTETRABENAZINE 9 MG PO TABS: 2.0000 | ORAL_TABLET | Freq: Two times a day (BID) | ORAL | Status: DC

## 2020-12-30 MED ORDER — FAMOTIDINE 20 MG PO TABS
20.0000 mg | ORAL_TABLET | Freq: Every day | ORAL | Status: DC
  Administered 2020-12-31: 20 mg via ORAL
  Filled 2020-12-30 (×2): qty 1

## 2020-12-30 MED ORDER — DIPHENOXYLATE-ATROPINE 2.5-0.025 MG PO TABS: 1.0000 | ORAL_TABLET | Freq: Four times a day (QID) | ORAL | Status: DC | PRN

## 2020-12-30 MED ORDER — FUROSEMIDE 40 MG PO TABS
20.0000 mg | ORAL_TABLET | Freq: Every day | ORAL | Status: DC
Start: 2020-12-30 — End: 2020-12-31
  Administered 2020-12-30 – 2020-12-31 (×2): 20 mg via ORAL
  Filled 2020-12-30 (×2): qty 1

## 2020-12-30 MED ORDER — ACETAMINOPHEN 325 MG PO TABS: 650.0000 mg | ORAL_TABLET | Freq: Four times a day (QID) | ORAL | Status: DC | PRN

## 2020-12-30 MED ORDER — LORATADINE 10 MG PO TABS
10.0000 mg | ORAL_TABLET | Freq: Every day | ORAL | Status: DC
  Administered 2020-12-30 – 2020-12-31 (×2): 10 mg via ORAL
  Filled 2020-12-30 (×2): qty 1

## 2020-12-30 MED ORDER — MELATONIN 3 MG PO TABS
9.0000 mg | ORAL_TABLET | Freq: Every day | ORAL | Status: DC
  Administered 2020-12-31: 9 mg via ORAL
  Filled 2020-12-30 (×2): qty 3

## 2020-12-30 MED ORDER — INSULIN DETEMIR 100 UNIT/ML ~~LOC~~ SOLN
25.0000 [IU] | Freq: Every day | SUBCUTANEOUS | Status: DC
  Administered 2020-12-30 – 2020-12-31 (×2): 25 [IU] via SUBCUTANEOUS
  Filled 2020-12-30 (×3): qty 0.25

## 2020-12-30 MED ORDER — POTASSIUM CHLORIDE CRYS ER 10 MEQ PO TBCR
10.0000 meq | EXTENDED_RELEASE_TABLET | Freq: Every day | ORAL | Status: DC
  Administered 2020-12-30 – 2020-12-31 (×2): 10 meq via ORAL
  Filled 2020-12-30 (×2): qty 1

## 2020-12-30 MED ORDER — QUETIAPINE FUMARATE 25 MG PO TABS
150.0000 mg | ORAL_TABLET | Freq: Every day | ORAL | Status: DC
  Filled 2020-12-30: qty 2

## 2020-12-30 MED ORDER — ZOLPIDEM TARTRATE 5 MG PO TABS
5.0000 mg | ORAL_TABLET | Freq: Every evening | ORAL | Status: DC | PRN
  Administered 2020-12-31: 5 mg via ORAL
  Filled 2020-12-30 (×2): qty 1

## 2020-12-30 MED ORDER — LEVOTHYROXINE SODIUM 50 MCG PO TABS
100.0000 ug | ORAL_TABLET | Freq: Every day | ORAL | Status: DC
  Administered 2020-12-31: 100 ug via ORAL
  Filled 2020-12-30: qty 2

## 2020-12-30 MED ORDER — TRAZODONE HCL 50 MG PO TABS
50.0000 mg | ORAL_TABLET | Freq: Every day | ORAL | Status: DC
  Administered 2020-12-31: 50 mg via ORAL
  Filled 2020-12-30 (×3): qty 1

## 2020-12-30 MED ORDER — UMECLIDINIUM BROMIDE 62.5 MCG/INH IN AEPB
1.0000 | INHALATION_SPRAY | Freq: Every day | RESPIRATORY_TRACT | Status: DC
  Administered 2020-12-31: 1 via RESPIRATORY_TRACT
  Filled 2020-12-30: qty 7

## 2020-12-30 MED ORDER — BENZTROPINE MESYLATE 1 MG PO TABS
1.0000 mg | ORAL_TABLET | Freq: Two times a day (BID) | ORAL | Status: DC
  Administered 2020-12-30 – 2020-12-31 (×3): 1 mg via ORAL
  Filled 2020-12-30 (×4): qty 1

## 2020-12-30 MED ORDER — CARIPRAZINE HCL 1.5 MG PO CAPS
3.0000 mg | ORAL_CAPSULE | Freq: Every day | ORAL | Status: DC
  Administered 2020-12-30 – 2020-12-31 (×2): 3 mg via ORAL
  Filled 2020-12-30 (×2): qty 2

## 2020-12-30 MED ORDER — DULOXETINE HCL 30 MG PO CPEP
30.0000 mg | ORAL_CAPSULE | Freq: Every day | ORAL | Status: DC
  Administered 2020-12-30 – 2020-12-31 (×2): 30 mg via ORAL
  Filled 2020-12-30 (×2): qty 1

## 2020-12-30 MED ORDER — METFORMIN HCL 500 MG PO TABS: 1000.0000 mg | ORAL_TABLET | Freq: Two times a day (BID) | ORAL | Status: DC

## 2020-12-30 MED ORDER — HALOPERIDOL 0.5 MG PO TABS: 4.0000 mg | ORAL_TABLET | Freq: Every day | ORAL | Status: DC

## 2020-12-30 MED ORDER — FAMOTIDINE 20 MG PO TABS: 20.0000 mg | ORAL_TABLET | Freq: Two times a day (BID) | ORAL | Status: DC

## 2020-12-30 MED ORDER — METFORMIN HCL 500 MG PO TABS
500.0000 mg | ORAL_TABLET | Freq: Two times a day (BID) | ORAL | Status: DC
  Administered 2020-12-30 – 2020-12-31 (×2): 500 mg via ORAL
  Filled 2020-12-30 (×3): qty 1

## 2020-12-30 MED ORDER — ALBUTEROL SULFATE HFA 108 (90 BASE) MCG/ACT IN AERS: 2.0000 | INHALATION_SPRAY | Freq: Four times a day (QID) | RESPIRATORY_TRACT | Status: DC | PRN

## 2020-12-30 MED ORDER — INSULIN DETEMIR 100 UNIT/ML ~~LOC~~ SOLN
20.0000 [IU] | Freq: Every day | SUBCUTANEOUS | Status: DC
  Filled 2020-12-30 (×2): qty 0.2

## 2020-12-30 MED ORDER — HALOPERIDOL 0.5 MG PO TABS
0.5000 mg | ORAL_TABLET | ORAL | Status: DC
  Administered 2020-12-30 – 2020-12-31 (×2): 0.5 mg via ORAL
  Filled 2020-12-30 (×4): qty 1

## 2020-12-30 MED ORDER — PRAZOSIN HCL 1 MG PO CAPS
1.0000 mg | ORAL_CAPSULE | Freq: Every day | ORAL | Status: DC
  Filled 2020-12-30 (×2): qty 1

## 2020-12-30 MED ORDER — METFORMIN HCL 500 MG PO TABS: 500.0000 mg | ORAL_TABLET | Freq: Two times a day (BID) | ORAL | Status: DC

## 2020-12-30 NOTE — ED Notes (Signed)
Pt eating lunch
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
## 2020-12-30 NOTE — ED Provider Notes (Signed)
TTS consultation is appreciated.  Patient will need inpatient geriatric-psychiatric care.  They are currently working on appropriate placement.   Dione Booze, MD 12/30/20 [phone]

## 2020-12-30 NOTE — ED Notes (Signed)
Pt is speaking loudly with sitter. Unable to follow conversation due to severe flight of ideas.

## 2020-12-30 NOTE — ED Notes (Signed)
Patient has been up all night and very talkative. Patient very disillusions of time and place. Patient talking to people who are not there. Patient having mood swings.

## 2020-12-30 NOTE — Progress Notes (Signed)
Per Ysidro Evert Bobbitt,NP, patient meets criteria for inpatient treatment. There are no available or appropriate beds at [facility] today. CSW faxed referrals to the following facilities for review:  [facility]  Pending - No Request Sent N/A [address] [place]., [place] [address] [phone] --  [facility]  Pending - No Request Sent N/A [address] [place] [address] [phone] --  [facility]  Pending - No Request Sent N/A [address] [place] [address] [phone] --  [facility]  Pending - No Request Sent N/A [address] [place] [address] [phone] --  [facility]  Pending - No Request Sent N/A [address] [place], [place] [address] [phone] --  [facility]  Pending - No Request Sent N/A [address] [place] [address] [phone] --  [facility]  Pending - No Request Sent N/A [address] [place] [address] [phone] --  [facility]  Pending - No Request Sent N/A [address] [place] [address] [phone] --  [facility]  Pending - No Request Sent N/A [address] [place] [address] [phone] --  [facility]  Pending - No Request Sent N/A [address] [place] [address] [phone] --  [facility]  Pending - No Request Sent N/A [address] [place]e [place]s., [place] [address] [phone] --  [facility]  Pending - No Request Sent N/A [address] [place] [address] [phone] --  [facility]  Pending - No Request Sent N/A [address], [place] [address] [phone] --   [facility]  Pending - No Request Sent N/A [address] [place] [address] [phone] --  [facility]  Pending - No Request Sent N/A [address] [place] [address] [phone] --  [facility]  Pending - No Request Sent N/A [address] [place], [place] [address] [phone] --  [facility]  Pending - No Request Sent N/A [address] [place] [address] [phone] --  [facility]  Pending - No Request Sent N/A [address], [place] [address] [phone]    TTS will continue to seek bed placement.  Crissie Reese, MSW, LCSW-A, LCAS-A Phone: [phone] Disposition/TOC

## 2020-12-30 NOTE — ED Notes (Signed)
Patient still awake at this time. Patient is calm and cooperative. Patient given water to drink.

## 2020-12-30 NOTE — ED Notes (Signed)
ssitter at bedside refused food left at bedside

## 2020-12-30 NOTE — ED Notes (Signed)
Pt ambulated to restroom without incident. Dotti Busey

## 2020-12-30 NOTE — ED Notes (Signed)
Patient sitting in chaiir watching and singing along with the tv. Patient stating  that she was getting cold and going to bed. Patient in bed assisted with blankets

## 2020-12-30 NOTE — ED Notes (Signed)
Patient escorted to restroom at this time. Patient in hallway yelling. Patient redirected at this time

## 2020-12-30 NOTE — Progress Notes (Signed)
CSW received a call from [facility] in reference to see if this patient still needed placement. Wanisha with [facility] advised that she will present this patient to the provider for review.   Hannelore Bova, MSW, LCSW-A, LCAS-A Phone: [phone] Disposition/TOC  

## 2020-12-31 DIAGNOSIS — F209 Schizophrenia, unspecified: Secondary | ICD-10-CM | POA: Diagnosis not present

## 2020-12-31 MED ORDER — ZIPRASIDONE MESYLATE 20 MG IM SOLR
20.0000 mg | Freq: Two times a day (BID) | INTRAMUSCULAR | Status: DC | PRN
Start: 2020-12-31 — End: 2020-12-31

## 2020-12-31 NOTE — Discharge Instructions (Addendum)
Patient is being transferred to [facility] unit with Dr. Tamela Oddi

## 2020-12-31 NOTE — ED Provider Notes (Signed)
Emergency Medicine Observation Re-evaluation Note  Carrie Clark is a 60 y.o. female, seen on rounds today.  Pt initially presented to the ED for complaints of Agitation Currently, the patient is intermittently agitated and requiring antipsychotic therapy including Geodon.  Physical Exam  BP (!) 156/79 (BP Location: Left Arm)   Pulse (!) 113   Temp 98.8 F (37.1 C) (Oral)   Resp 20   Ht 1.651 m (5\' 5" )   Wt 68 kg   SpO2 98%   BMI 24.96 kg/m  Physical Exam General: Agitated but intermittently restful Cardiac: No tachycardia Lungs: Clear Psych: Intermittent agitation and loud verbal outbursts  ED Course / MDM  EKG:   I have reviewed the labs performed to date as well as medications administered while in observation.  Recent changes in the last 24 hours include requiring intermittent control of agitation with antipsychotic therapy.  Plan  Current plan is for inpatient admission based on last psychiatric note. Carrie Clark is under involuntary commitment.      Orvan Seen, MD 12/31/20 [phone]

## 2020-12-31 NOTE — Progress Notes (Signed)
Per Malachi Bonds, pt has been accepted to [facility]. Accepting provider is Dr. Guss Bunde. Patient can arrive anytime pending IVC being faxed to the facility before calling report. Fax#: [phone]. Number for report is [phone].   Crissie Reese, MSW, LCSW-A Phone: [phone] Disposition/TOC

## 2020-12-31 NOTE — Progress Notes (Signed)
Per Vivien Presto, patient meets criteria for inpatient treatment. There are no available or appropriate beds at [facility] today. CSW re-faxed referrals to the following facilities for review:   [facility]  Pending - Request Sent N/A [address] [place] [place] [address] [phone] --  [facility]  Pending - Request Sent N/A [address] [place] [place] [address] [phone] --  [facility]  Pending - Request Sent N/A [address] [place] [address] [phone] --  [facility]  Pending - Request Sent N/A [address] [place] [address] [phone] --  [facility]  Pending - Request Sent N/A [address] [place], [place] [address] [phone] --  [facility]  Pending - Request Sent N/A [address] [place] [place] [address] [phone] --  [facility]  Pending - Request Sent N/A [address] [place] [address] [phone] --  [facility]  Pending - Request Sent N/A [address] [place] [address] [phone] --  [facility]  Pending - Request Sent N/A [address] [place] [address] [phone] --  [facility]  Pending - Request Sent N/A [address] [place] [address] [phone] --  [facility]  Pending - Request Sent N/A [address] [place] [address] [phone] --  [facility]  Pending - Request Sent N/A [address] [place] [address] [phone] --   [facility]  Pending - Request Sent N/A [address], [place] [address] [phone] --  [facility]  Pending - Request Sent N/A [address] [place] [address] [phone] --  [facility]  Pending - Request Sent N/A [address] [place] [address] [phone] --  [facility]  Pending - Request Sent N/A [address] [place], [place] [address] [phone] --  [facility]  Pending - Request Sent N/A [address] [place] [address] [phone] --  [facility]  Pending - Request Sent N/A [address] [place] [address] [phone] --   TTS will continue to seek bed placement.  Crissie Reese, MSW, LCSW-A, LCAS-A Phone: [phone] Disposition/TOC

## 2020-12-31 NOTE — ED Notes (Signed)
[facility] here to transport pt to [facility], pt cooperative.   Called to give report and no answer at [phone]

## 2020-12-31 NOTE — Progress Notes (Signed)
CSW received a call from [place] with [facility] who provided an update to accepting information. It was reported that report can be called after 7:00pm. Further, Malachi Bonds asked if there was any updated labs for the patient and if so, could the labs be faxed to Fax#: [phone].   Crissie Reese, MSW, LCSW-A, LCAS-A Phone: [phone] Disposition/TOC

## 2020-12-31 NOTE — BH Assessment (Signed)
Patient was seen for reassessment.  She continues to be a little agitated and demanding to go home.  Patient states that "I am my only provider and I want to go home."  Patient continues to be a little disorganized.  Patient is still recommended for inpatient treatment in order to get her mood and behavior more stabilized prior to returning back to the group home.

## 2021-02-20 ENCOUNTER — Ambulatory Visit: Payer: Medicare Other | Admitting: Podiatry

## 2021-03-21 ENCOUNTER — Ambulatory Visit: Payer: Medicare Other | Admitting: Podiatry

## 2021-04-23 ENCOUNTER — Ambulatory Visit (INDEPENDENT_AMBULATORY_CARE_PROVIDER_SITE_OTHER): Payer: Medicare Other | Admitting: Podiatry

## 2021-04-23 ENCOUNTER — Other Ambulatory Visit: Payer: Self-pay

## 2021-04-23 ENCOUNTER — Encounter: Payer: Self-pay | Admitting: Podiatry

## 2021-04-23 DIAGNOSIS — M79675 Pain in left toe(s): Secondary | ICD-10-CM

## 2021-04-23 DIAGNOSIS — Z794 Long term (current) use of insulin: Secondary | ICD-10-CM

## 2021-04-23 DIAGNOSIS — B351 Tinea unguium: Secondary | ICD-10-CM

## 2021-04-23 DIAGNOSIS — E119 Type 2 diabetes mellitus without complications: Secondary | ICD-10-CM

## 2021-04-23 DIAGNOSIS — M79674 Pain in right toe(s): Secondary | ICD-10-CM

## 2021-04-23 NOTE — Progress Notes (Signed)
This patient returns to my office for at risk foot care.  This patient requires this care by a professional since this patient will be at risk due to having diabetes.  This patient is unable to cut nails himself since the patient cannot reach his nails.These nails are painful walking and wearing shoes.  This patient presents for at risk foot care today.  General Appearance  Alert, conversant and in no acute stress.  Vascular  Dorsalis pedis and posterior tibial  pulses are palpable  bilaterally.  Capillary return is within normal limits  bilaterally. Temperature is within normal limits  bilaterally.  Neurologic  Senn-Weinstein monofilament wire test within normal limits  bilaterally. Muscle power within normal limits bilaterally.  Nails Thick disfigured discolored nails with subungual debris  Hallux nails bilaterally. No evidence of bacterial infection or drainage bilaterally.  Orthopedic  No limitations of motion  feet .  No crepitus or effusions noted.  No bony pathology or digital deformities noted.  Skin  normotropic skin with no porokeratosis noted bilaterally.  No signs of infections or ulcers noted.     Onychomycosis  Pain in right toes  Pain in left toes  Consent was obtained for treatment procedures.   Mechanical debridement of nails 1-5  bilaterally performed with a nail nipper.  Filed with dremel without incident.    Return office visit    3  months                  Told patient to return for periodic foot care and evaluation due to potential at risk complications.   Landen Breeland DPM  

## 2021-06-08 ENCOUNTER — Ambulatory Visit (HOSPITAL_COMMUNITY)
Admission: RE | Admit: 2021-06-08 | Discharge: 2021-06-08 | Disposition: A | Discharge status: Home / Self Care | Payer: Medicare Other | Source: Ambulatory Visit | Attending: Family Medicine | Admitting: Family Medicine

## 2021-06-08 ENCOUNTER — Other Ambulatory Visit: Payer: Self-pay

## 2021-06-08 ENCOUNTER — Ambulatory Visit: Payer: Medicare Other

## 2021-06-08 ENCOUNTER — Ambulatory Visit
Admission: EM | Admit: 2021-06-08 | Discharge: 2021-06-08 | Disposition: A | Discharge status: Home / Self Care | Payer: Medicare Other | Attending: Family Medicine | Admitting: Family Medicine

## 2021-06-08 DIAGNOSIS — S92912A Unspecified fracture of left toe(s), initial encounter for closed fracture: Secondary | ICD-10-CM | POA: Diagnosis not present

## 2021-06-08 DIAGNOSIS — B353 Tinea pedis: Secondary | ICD-10-CM | POA: Diagnosis not present

## 2021-06-08 DIAGNOSIS — M79672 Pain in left foot: Secondary | ICD-10-CM | POA: Insufficient documentation

## 2021-06-08 DIAGNOSIS — Y33XXXA Other specified events, undetermined intent, initial encounter: Secondary | ICD-10-CM | POA: Diagnosis not present

## 2021-06-08 DIAGNOSIS — S92415A Nondisplaced fracture of proximal phalanx of left great toe, initial encounter for closed fracture: Secondary | ICD-10-CM | POA: Diagnosis not present

## 2021-06-08 DIAGNOSIS — S99922A Unspecified injury of left foot, initial encounter: Secondary | ICD-10-CM | POA: Insufficient documentation

## 2021-06-08 DIAGNOSIS — Y939 Activity, unspecified: Secondary | ICD-10-CM | POA: Insufficient documentation

## 2021-06-08 DIAGNOSIS — L03116 Cellulitis of left lower limb: Secondary | ICD-10-CM | POA: Diagnosis not present

## 2021-06-08 MED ORDER — CEPHALEXIN 500 MG PO CAPS: 500.0000 mg | ORAL_CAPSULE | Freq: Two times a day (BID) | ORAL | 0 refills | Status: DC

## 2021-06-08 MED ORDER — KETOCONAZOLE 2 % EX CREA: 1.0000 "application " | TOPICAL_CREAM | Freq: Two times a day (BID) | CUTANEOUS | 0 refills | Status: DC | PRN

## 2021-06-08 NOTE — ED Provider Notes (Signed)
[facility]    CSN: [id number] Arrival date & time: 06/08/21  1036      History   Chief Complaint Chief Complaint  Patient presents with   Foot Pain    Left foot pain    HPI Carrie Clark is a 61 y.o. female.   Patient presenting today with 1 day history of left foot pain, bruising, bleeding.  She states that she thinks she kicked a wooden chair at her house last night.  Her caregiver who presents with her today states that when she checked on her last night the webs of her toes were cracking and bleeding in addition to diffuse bruising and swelling to the bases of her toes.  Has not tried anything thus far for symptoms.  Able to bear weight but very painful to do so.  Denies fever, numbness, new products used on the skin.   Past Medical History:  Diagnosis Date   COPD (chronic obstructive pulmonary disease) (HCC)    CVA (cerebral infarction)    Depression    Hypertension    Hypothyroidism    Schizoaffective disorder, bipolar type ([place])    Schizophrenia ([place])    Stroke ([place])    Tardive dyskinesia    Tardive dyskinesia     Patient Active Problem List   Diagnosis Date Noted   Pain due to onychomycosis of toenails of both feet 10/18/2020   UTI (urinary tract infection) 12/03/2018   Hypothyroidism 12/03/2018   HTN (hypertension) 12/03/2018   COPD (chronic obstructive pulmonary disease) ([place]) 12/03/2018   Diabetes ([place]) 12/03/2018   Altered mental status 12/03/2018   Schizoaffective disorder, bipolar type ([place]) 09/11/2015   Aggression    Episode of behavior change    Psychoses ([place])     Past Surgical History:  Procedure Laterality Date   NO PAST SURGERIES      OB History   No obstetric history on file.      Home Medications    Prior to Admission medications   Medication Sig Start Date End Date Taking? Authorizing Provider  cephALEXin (KEFLEX) 500 MG capsule Take 1 capsule (500 mg total) by mouth 2 (two) times daily. 06/08/21  Yes Volney American, PA-C  ketoconazole (NIZORAL) 2 % cream Apply 1 application topically 2 (two) times daily as needed for irritation. 06/08/21  Yes Volney American, PA-C  acetaminophen (TYLENOL) 325 MG tablet Take 2 tablets (650 mg total) by mouth every 6 (six) hours as needed for moderate pain. Patient not taking: Reported on 12/29/2020 01/29/19   Mesner, Corene Cornea, MD  albuterol (PROVENTIL HFA;VENTOLIN HFA) 108 (90 Base) MCG/ACT inhaler Inhale 2 puffs into the lungs every 6 (six) hours as needed for wheezing or shortness of breath. 06/07/15   Mesner, Corene Cornea, MD  atorvastatin (LIPITOR) 40 MG tablet Take 40 mg by mouth at bedtime.     [provider]  AUSTEDO 9 MG TABS Take 2 tablets by mouth 2 (two) times daily. 12/21/20   [provider]  benztropine (COGENTIN) 1 MG tablet Take 1 mg by mouth 2 (two) times daily.     [provider]  cetirizine (ZYRTEC) 10 MG tablet Take 1 tablet (10 mg total) by mouth daily. 11/11/14   Withrow, Elyse Jarvis, FNP  Deutetrabenazine (AUSTEDO) 12 MG TABS Take 12 mg by mouth in the morning and at bedtime. Patient not taking: Reported on 12/29/2020    [provider]  diphenhydrAMINE (BENADRYL ALLERGY) 25 MG tablet Take 1 tablet (25 mg total) by  mouth at bedtime. Patient not taking: Reported on 12/29/2020 11/17/19 12/17/19  Vanessa [place], MD  diphenoxylate-atropine (LOMOTIL) 2.5-0.025 MG tablet Take 1 tablet by mouth 4 (four) times daily as needed for diarrhea or loose stools.    [provider]  donepezil (ARICEPT) 5 MG tablet Take 5 mg by mouth at bedtime.    [provider]  DULoxetine (CYMBALTA) 30 MG capsule Take 1 capsule (30 mg total) by mouth daily. 11/17/19 12/29/20  Vanessa [place]ard, MD  famotidine (PEPCID) 20 MG tablet Take 1 tablet (20 mg total) by mouth 2 (two) times daily. 11/11/14   Withrow, Elyse Jarvis, FNP  furosemide (LASIX) 20 MG tablet Take 1 tablet (20 mg total) by mouth daily. 11/11/14   Withrow, Elyse Jarvis, FNP  Glucagon (GVOKE HYPOPEN  1-PACK) 1 MG/0.2ML SOAJ Inject 1 mg into the skin daily as needed.    [provider]  haloperidol (HALDOL) 0.5 MG tablet Take 1 tablet (0.5 mg total) by mouth every morning. 11/17/19 12/29/20  Vanessa [place], MD  haloperidol (HALDOL) 2 MG tablet Take 2 tablets (4 mg total) by mouth at bedtime. 11/17/19 12/29/20  Vanessa [place], MD  haloperidol decanoate (HALDOL DECANOATE) 100 MG/ML injection Inject 0.25 mLs (25 mg total) into the muscle every 28 (twenty-eight) days for 1 day. 12/17/19 12/29/20  Vanessa [place], MD  Insulin Detemir (LEVEMIR) 100 UNIT/ML Pen Inject 25 Units into the skin 2 (two) times daily. Inject 25 units at 8 AM and then inject 20 units at 5 PM    [provider]  levothyroxine (SYNTHROID) 100 MCG tablet Take 100 mcg by mouth daily before breakfast.     [provider]  lisinopril (PRINIVIL,ZESTRIL) 5 MG tablet Take 5 mg by mouth daily.    [provider]  LORazepam (ATIVAN) 1 MG tablet Take 1 mg by mouth 3 (three) times daily. 12/28/20   [provider]  Melatonin 3 MG SUBL Take 10 tablets by mouth at bedtime. 11/29/20   [provider]  metFORMIN (GLUCOPHAGE) 1000 MG tablet Take 1 tablet (1,000 mg total) by mouth 2 (two) times daily. Patient not taking: Reported on 12/29/2020 09/11/15   Patrecia Pour, NP  metFORMIN (GLUCOPHAGE) 500 MG tablet Take 500 mg by mouth 2 (two) times daily. 12/29/20   [provider]  Multiple Vitamins-Minerals (MULTIVITAMIN WITH MINERALS) tablet Take 1 tablet by mouth daily. 11/11/14   Withrow, Elyse Jarvis, FNP  Oxcarbazepine (TRILEPTAL) 300 MG tablet Take 300 mg by mouth 2 (two) times daily. 12/28/20   [provider]  potassium chloride (K-DUR,KLOR-CON) 10 MEQ tablet Take 1 tablet (10 mEq total) by mouth daily. 11/11/14   Withrow, Elyse Jarvis, FNP  potassium chloride (KLOR-CON) 10 MEQ tablet Take 10 mEq by mouth daily. Patient not taking: Reported on 12/29/2020 12/28/20   [provider]  prazosin  (MINIPRESS) 1 MG capsule Take 1 mg by mouth at bedtime. 12/28/20   [provider]  QUEtiapine (SEROQUEL) 100 MG tablet Take 150 mg by mouth at bedtime. 12/27/20   [provider]  tiotropium (SPIRIVA) 18 MCG inhalation capsule Place 18 mcg into inhaler and inhale daily.    [provider]  traZODone (DESYREL) 50 MG tablet Take 50 mg by mouth at bedtime. 12/28/20   [provider]  trihexyphenidyl (ARTANE) 2 MG tablet Take 2 mg by mouth daily as needed. 12/27/20   [provider]  VRAYLAR 3 MG capsule Take 3 mg by mouth daily. 11/29/20  [provider]  zolpidem (AMBIEN) 5 MG tablet Take 5 mg by mouth at bedtime as needed. 12/28/20   [provider]  fluPHENAZine (PROLIXIN) 10 MG tablet Take 1 tablet (10 mg total) by mouth 3 (three) times daily. 12/03/18 11/17/19  Bettey Costa, MD  fluPHENAZine decanoate (PROLIXIN) 25 MG/ML injection Inject 50 mg into the muscle every 28 (twenty-eight) days.  11/17/19  [provider]  lurasidone (LATUDA) 20 MG TABS tablet Take 20 mg by mouth daily in the afternoon.   11/17/19  [provider]    Family History Family History  Family history unknown: Yes    Social History Social History   Tobacco Use   Smoking status: Every Day    Packs/day: 1.00    Types: Cigarettes   Smokeless tobacco: Never  Vaping Use   Vaping Use: Never used  Substance Use Topics   Alcohol use: No   Drug use: No     Allergies   Fish allergy   Review of Systems Review of Systems Per HPI  Physical Exam Triage Vital Signs ED Triage Vitals [06/08/21 1144]  Enc Vitals Group     BP (!) 143/79     Pulse Rate 100     Resp 18     Temp 97.6 F (36.4 C)     Temp Source Oral     SpO2 92 %     Weight      Height      Head Circumference      Peak Flow      Pain Score 10     Pain Loc      Pain Edu?      Excl. in [place]?    No data found.  Updated Vital Signs BP (!) 143/79 (BP Location: Right Arm)     Pulse 100    Temp 97.6 F (36.4 C) (Oral)    Resp 18    SpO2 92%   Visual Acuity Right Eye Distance:   Left Eye Distance:   Bilateral Distance:    Right Eye Near:   Left Eye Near:    Bilateral Near:     Physical Exam Vitals and nursing note reviewed.  Constitutional:      Appearance: Normal appearance. She is not ill-appearing.  HENT:     Head: Atraumatic.  Eyes:     Extraocular Movements: Extraocular movements intact.     Conjunctiva/sclera: Conjunctivae normal.  Cardiovascular:     Rate and Rhythm: Normal rate.  Pulmonary:     Effort: Pulmonary effort is normal. No respiratory distress.  Musculoskeletal:        General: Swelling, tenderness and signs of injury present.     Cervical back: Normal range of motion and neck supple.     Comments: Diffuse swelling distal left foot with bruising worse at base of great toe but also some along the MTPs of multiple other toes.  Tender to palpation in all these regions and pain with weightbearing  Skin:    General: Skin is warm.     Findings: Bruising present.     Comments: Diffuse dryness and peeling, worse in the webspaces of the toes of the left foot with bleeding and maceration in these areas  Neurological:     Mental Status: She is alert. Mental status is at baseline.     Comments: Left foot neurovascularly intact  Psychiatric:        Mood and Affect: Mood normal.  Thought Content: Thought content normal.        Judgment: Judgment normal.     UC Treatments / Results  Labs (all labs ordered are listed, but only abnormal results are displayed) Labs Reviewed - No data to display  EKG   Radiology No results found.  Procedures Procedures (including critical care time)  Medications Ordered in UC Medications - No data to display  Initial Impression / Assessment and Plan / UC Course  I have reviewed the triage vital signs and the nursing notes.  Pertinent labs & imaging results that were available during my  care of the patient were reviewed by me and considered in my medical decision making (see chart for details).     Order for outpatient imaging x-ray placed as our x-ray machine is currently under repair, placed in a postop shoe until we can obtain this imaging for rule out of fractures to the left foot.  We will also treat for significant tinea pedis infection with a secondary bacterial infection with ketoconazole cream, Keflex.  Discussed home wound care, RICE protocol and close podiatry follow-up.  Return for any worsening symptoms.  Final Clinical Impressions(s) / UC Diagnoses   Final diagnoses:  Acute foot pain, left  Tinea pedis of left foot  Cellulitis of left lower extremity   Discharge Instructions   None    ED Prescriptions     Medication Sig Dispense Auth. Provider   ketoconazole (NIZORAL) 2 % cream Apply 1 application topically 2 (two) times daily as needed for irritation. 80 g Volney American, [place]   cephALEXin (KEFLEX) 500 MG capsule Take 1 capsule (500 mg total) by mouth 2 (two) times daily. 14 capsule Volney American, [place]      PDMP not reviewed this encounter.   Volney American, [place] 06/08/21 [id number]

## 2021-06-08 NOTE — ED Triage Notes (Signed)
Patients caregiver states that she was complaining of her left foot hurting so the caregiver checked her foot and it was bleeding between all of her toes and bruised across her toe line.  Patients' caregiver cleaned her foot and applied some wound cream to her foot and bandaged it up.  Caregiver came back this morning and now the left foot is swollen

## 2021-06-14 ENCOUNTER — Other Ambulatory Visit: Payer: Self-pay

## 2021-06-14 ENCOUNTER — Ambulatory Visit: Payer: Medicare Other | Admitting: Orthopaedic Surgery

## 2021-06-15 ENCOUNTER — Ambulatory Visit (INDEPENDENT_AMBULATORY_CARE_PROVIDER_SITE_OTHER): Payer: Medicare Other | Admitting: Orthopedic Surgery

## 2021-06-15 ENCOUNTER — Encounter: Payer: Self-pay | Admitting: Orthopedic Surgery

## 2021-06-15 DIAGNOSIS — S92515A Nondisplaced fracture of proximal phalanx of left lesser toe(s), initial encounter for closed fracture: Secondary | ICD-10-CM

## 2021-06-15 DIAGNOSIS — S92415A Nondisplaced fracture of proximal phalanx of left great toe, initial encounter for closed fracture: Secondary | ICD-10-CM | POA: Diagnosis not present

## 2021-06-15 NOTE — Progress Notes (Signed)
New Patient Visit  Assessment: Carrie Clark is a 61 y.o. female with the following: 1. Nondisplaced fracture of proximal phalanx of left great toe, initial encounter for closed fracture 2. Closed nondisplaced fracture of proximal phalanx of lesser toe of left foot, initial encounter - 2nd, 4th and 5th toes  Plan: Patient sustained multiple fractures to the toes on her left foot.  On physical exam, she has some bruising and swelling.  She is able to ambulate, with minimal assistance in a regular shoe.  She does have a postop shoe, but she has not tolerated this well.  I discussed multiple different options with her caregiver, including continue with postop shoe, transition to a walking boot.  Caregiver does not think that she will tolerate a different type of shoe.  I recommend medications as needed.  Elevate the foot is much as possible if swollen.  Although she is a diabetic, I think that the pain will continue to improve, and the fractures will heal reasonably well.  However, if she develops further issues, including open wounds, they should contact the clinic.  Follow-up as needed.   Follow-up: Return if symptoms worsen or fail to improve.  Subjective:  Chief Complaint  Patient presents with   fracture care    LT foot DOI 06/07/21   New Patient (Initial Visit)    History of Present Illness: Carrie Clark is a 61 y.o. female who presents for evaluation of left foot pain.  Patient lives in an assisted care living facility, and has limited understanding and insight.  According to the caregiver, she banged her foot on a table or a chair, approximately 1 week ago.  The following day, there was swelling and bruising about the foot.  They presented for an x-ray, which demonstrated multiple fractures of the toes to her left foot.  She was provided with a postop shoe.  However, she has not been wearing the postop shoe.  She is able to tolerate a regular shoe, and is walking with minimal assistance.   She is not taking any pain medications.  She does not appear to be complaining of pain.   Review of Systems: No fevers or chills No numbness or tingling No chest pain No shortness of breath No bowel or bladder dysfunction No GI distress No headaches   Medical History:  Past Medical History:  Diagnosis Date   COPD (chronic obstructive pulmonary disease) (HCC)    CVA (cerebral infarction)    Depression    Hypertension    Hypothyroidism    Schizoaffective disorder, bipolar type (HCC)    Schizophrenia ([place])    Stroke ([place])    Tardive dyskinesia    Tardive dyskinesia     Past Surgical History:  Procedure Laterality Date   NO PAST SURGERIES      Family History  Family history unknown: Yes   Social History   Tobacco Use   Smoking status: Every Day    Packs/day: 1.00    Types: Cigarettes   Smokeless tobacco: Never  Vaping Use   Vaping Use: Never used  Substance Use Topics   Alcohol use: No   Drug use: No    Allergies  Allergen Reactions   Fish Allergy Other (See Comments)    Allergy reaction not listed- on MAR    Current Meds  Medication Sig   albuterol (PROVENTIL HFA;VENTOLIN HFA) 108 (90 Base) MCG/ACT inhaler Inhale 2 puffs into the lungs every 6 (six) hours as needed for wheezing or shortness of breath.  atorvastatin (LIPITOR) 40 MG tablet Take 40 mg by mouth at bedtime.    AUSTEDO 9 MG TABS Take 2 tablets by mouth 2 (two) times daily.   benztropine (COGENTIN) 1 MG tablet Take 1 mg by mouth 2 (two) times daily.    cephALEXin (KEFLEX) 500 MG capsule Take 1 capsule (500 mg total) by mouth 2 (two) times daily.   cetirizine (ZYRTEC) 10 MG tablet Take 1 tablet (10 mg total) by mouth daily.   Deutetrabenazine (AUSTEDO) 12 MG TABS Take 12 mg by mouth in the morning and at bedtime.   diphenoxylate-atropine (LOMOTIL) 2.5-0.025 MG tablet Take 1 tablet by mouth 4 (four) times daily as needed for diarrhea or loose stools.   donepezil (ARICEPT) 5 MG tablet Take 5 mg  by mouth at bedtime.   famotidine (PEPCID) 20 MG tablet Take 1 tablet (20 mg total) by mouth 2 (two) times daily.   furosemide (LASIX) 20 MG tablet Take 1 tablet (20 mg total) by mouth daily.   Glucagon (GVOKE HYPOPEN 1-PACK) 1 MG/0.2ML SOAJ Inject 1 mg into the skin daily as needed.   Insulin Detemir (LEVEMIR) 100 UNIT/ML Pen Inject 25 Units into the skin 2 (two) times daily. Inject 25 units at 8 AM and then inject 20 units at 5 PM   ketoconazole (NIZORAL) 2 % cream Apply 1 application topically 2 (two) times daily as needed for irritation.   levothyroxine (SYNTHROID) 100 MCG tablet Take 100 mcg by mouth daily before breakfast.    lisinopril (PRINIVIL,ZESTRIL) 5 MG tablet Take 5 mg by mouth daily.   LORazepam (ATIVAN) 1 MG tablet Take 1 mg by mouth 3 (three) times daily.   Melatonin 3 MG SUBL Take 10 tablets by mouth at bedtime.   metFORMIN (GLUCOPHAGE) 1000 MG tablet Take 1 tablet (1,000 mg total) by mouth 2 (two) times daily.   metFORMIN (GLUCOPHAGE) 500 MG tablet Take 500 mg by mouth 2 (two) times daily.   Multiple Vitamins-Minerals (MULTIVITAMIN WITH MINERALS) tablet Take 1 tablet by mouth daily.   Oxcarbazepine (TRILEPTAL) 300 MG tablet Take 300 mg by mouth 2 (two) times daily.   potassium chloride (K-DUR,KLOR-CON) 10 MEQ tablet Take 1 tablet (10 mEq total) by mouth daily.   potassium chloride (KLOR-CON) 10 MEQ tablet Take 10 mEq by mouth daily.   prazosin (MINIPRESS) 1 MG capsule Take 1 mg by mouth at bedtime.   QUEtiapine (SEROQUEL) 100 MG tablet Take 150 mg by mouth at bedtime.   tiotropium (SPIRIVA) 18 MCG inhalation capsule Place 18 mcg into inhaler and inhale daily.   traZODone (DESYREL) 50 MG tablet Take 50 mg by mouth at bedtime.   trihexyphenidyl (ARTANE) 2 MG tablet Take 2 mg by mouth daily as needed.   VRAYLAR 3 MG capsule Take 3 mg by mouth daily.   zolpidem (AMBIEN) 5 MG tablet Take 5 mg by mouth at bedtime as needed.    Objective: There were no vitals taken for this  visit.  Physical Exam:  General: No acute distress. and does not answer questions appropriately.  Gait: Left sided antalgic gait.  Patient wearing a regular shoe.  There is mild diffuse swelling over the forefoot.  No tenderness to palpation of the great toe, second toe, fourth toe or fifth toe.  There is ecchymosis over the forefoot.  She walks with a mild antalgic gait, not able to completely flex the forefoot.  Toes are warm and well-perfused.  IMAGING: I personally reviewed images previously obtained from the ED  X-ray  of the left foot demonstrates a minimally displaced fracture at the base of the proximal phalanx to the great toe, the second toe, the fourth toe and the fifth toe.  Some associated soft tissue swelling.  No dislocations.  New Medications:  No orders of the defined types were placed in this encounter.     Mordecai Rasmussen, MD  06/15/2021 1:11 PM

## 2021-07-25 ENCOUNTER — Encounter: Payer: Self-pay | Admitting: Podiatry

## 2021-07-25 ENCOUNTER — Ambulatory Visit (INDEPENDENT_AMBULATORY_CARE_PROVIDER_SITE_OTHER): Payer: Medicare Other | Admitting: Podiatry

## 2021-07-25 ENCOUNTER — Other Ambulatory Visit: Payer: Self-pay

## 2021-07-25 DIAGNOSIS — M79675 Pain in left toe(s): Secondary | ICD-10-CM | POA: Diagnosis not present

## 2021-07-25 DIAGNOSIS — B351 Tinea unguium: Secondary | ICD-10-CM

## 2021-07-25 DIAGNOSIS — E119 Type 2 diabetes mellitus without complications: Secondary | ICD-10-CM

## 2021-07-25 DIAGNOSIS — Z794 Long term (current) use of insulin: Secondary | ICD-10-CM | POA: Diagnosis not present

## 2021-07-25 DIAGNOSIS — M79674 Pain in right toe(s): Secondary | ICD-10-CM

## 2021-07-25 NOTE — Progress Notes (Signed)
This patient returns to my office for at risk foot care.  This patient requires this care by a professional since this patient will be at risk due to having diabetes.  This patient is unable to cut nails himself since the patient cannot reach his nails.These nails are painful walking and wearing shoes.  This patient presents for at risk foot care today.  General Appearance  Alert, conversant and in no acute stress.  Vascular  Dorsalis pedis and posterior tibial  pulses are palpable  bilaterally.  Capillary return is within normal limits  bilaterally. Temperature is within normal limits  bilaterally.  Neurologic  Senn-Weinstein monofilament wire test within normal limits  bilaterally. Muscle power within normal limits bilaterally.  Nails Thick disfigured discolored nails with subungual debris  Hallux nails bilaterally. No evidence of bacterial infection or drainage bilaterally.  Orthopedic  No limitations of motion  feet .  No crepitus or effusions noted.  No bony pathology or digital deformities noted.  Skin  normotropic skin with no porokeratosis noted bilaterally.  No signs of infections or ulcers noted.     Onychomycosis  Pain in right toes  Pain in left toes  Consent was obtained for treatment procedures.   Mechanical debridement of nails 1-5  bilaterally performed with a nail nipper.  Filed with dremel without incident.    Return office visit    3  months                  Told patient to return for periodic foot care and evaluation due to potential at risk complications.   Syerra Abdelrahman DPM  

## 2021-10-31 ENCOUNTER — Ambulatory Visit: Payer: Medicare Other | Admitting: Podiatry

## 2022-01-09 ENCOUNTER — Ambulatory Visit: Payer: Medicare Other | Admitting: Podiatry

## 2022-05-12 NOTE — Progress Notes (Unsigned)
[facility]  PATIENT: Carrie Clark DOB: December 07, 1960  REFERRING DOCTOR OR PCP: Berenice Bouton NP SOURCE: Patient, note from Dr. Gerilyn Pilgrim, imaging and lab reports, CT personally reviewed  _________________________________   HISTORICAL  CHIEF COMPLAINT:  Chief Complaint  Patient presents with   Follow-up    Pt in room #1 with her SS worker and car giver. Pt here today for dementia, neuropathy, falls TOC.    HISTORY OF PRESENT ILLNESS:  I saw your patient, Carrie Clark, at [facility] for a neurologic consultation regarding her dementia, neuropathy and history of falls.  She is a 61 year old woman with schizoaffective disorder who had been seeing Dr. Gerilyn Pilgrim and is transferring care upon his retirement.  She has had mild developmental delay but was able to go to high school.  She developed schizophrenia in her late teens/early twenties.    She has a long history of tardive dyskinesia.  She has had some cognitive issues that have apparently worsened over the last 5 to 10 years.  Additionally she has had some falls.   She has had poor balance.   She complains of a lot of head pain and sometimes complains of pain in her feet..      Dr. Gerilyn Pilgrim had diagnosed with neuropathy.  She has Type 1 DM and is on insulin.   She also reports pain in her hands with some numbness.    Her glucose if usually in range in the group home x 3 years with 4 times a day testing but was porrly controlled in the past.     She has dementia along with the schizoaffective disorder.  Details are uncertain..     For the tardive dyskinesia she is on trihexyphenidyl and Austedo with some benefit.     She is accompanied today by her social services caseworker and one of the care attendant at the group home that she lives in.  They were able to provide some of the information.   Studies:  CT scan of the head 10/22/2020 was normal for age.  Incidental note of hyperostosis interna  frontalis.  Also incidental note of blockage of the left ear canal likely due to cerumen.  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  Severe multilevel degenerative changes normal anterolisthesis of C3-C4, very large bridging anterior osteophytes at C4-C5, C5-C6 and C6-C7, severe loss of disc height at C6-C7 and C7-T1 and spinal stenosis of C5-C6 and C6-C7.  There is reversal of the cervical curvature.   REVIEW OF SYSTEMS: Constitutional: No fevers, chills, sweats, or change in appetite Eyes: No visual changes, double vision, eye pain Ear, nose and throat: No hearing loss, ear pain, nasal congestion, sore throat Cardiovascular: No chest pain, palpitations Respiratory:  No shortness of breath at rest or with exertion.   No wheezes GastrointestinaI: No nausea, vomiting, diarrhea, abdominal pain, fecal incontinence Genitourinary:  No dysuria, urinary retention or frequency.  No nocturia. Musculoskeletal:  No neck pain, back pain Integumentary: No rash, pruritus, skin lesions Neurological: as above Psychiatric: No depression at this time.  No anxiety Endocrine: No palpitations, diaphoresis, change in appetite, change in weigh or increased thirst Hematologic/Lymphatic:  No anemia, purpura, petechiae. Allergic/Immunologic: No itchy/runny eyes, nasal congestion, recent allergic reactions, rashes  ALLERGIES: Allergies  Allergen Reactions   Fish Allergy Other (See Comments)    Allergy reaction not listed- on [facility]    HOME MEDICATIONS:  Current Outpatient Medications:  acetaminophen (TYLENOL) 325 MG tablet, Take 2 tablets (650 mg total) by mouth every 6 (six) hours as needed for moderate pain., Disp: 60 tablet, Rfl: 0   albuterol (PROVENTIL HFA;VENTOLIN HFA) 108 (90 Base) MCG/ACT inhaler, Inhale 2 puffs into the lungs every 6 (six) hours as needed for wheezing or shortness of breath., Disp: 1 Inhaler, Rfl: 0   atorvastatin (LIPITOR) 40 MG tablet, Take  40 mg by mouth at bedtime. , Disp: , Rfl:    AUSTEDO 9 MG TABS, Take 2 tablets by mouth 2 (two) times daily., Disp: , Rfl:    benztropine (COGENTIN) 1 MG tablet, Take 1 mg by mouth 2 (two) times daily. , Disp: , Rfl:    cephALEXin (KEFLEX) 500 MG capsule, Take 1 capsule (500 mg total) by mouth 2 (two) times daily., Disp: 14 capsule, Rfl: 0   cetirizine (ZYRTEC) 10 MG tablet, Take 1 tablet (10 mg total) by mouth daily., Disp: , Rfl:    Deutetrabenazine (AUSTEDO) 12 MG TABS, Take 12 mg by mouth in the morning and at bedtime., Disp: , Rfl:    diphenoxylate-atropine (LOMOTIL) 2.5-0.025 MG tablet, Take 1 tablet by mouth 4 (four) times daily as needed for diarrhea or loose stools., Disp: , Rfl:    donepezil (ARICEPT) 5 MG tablet, Take 5 mg by mouth at bedtime., Disp: , Rfl:    famotidine (PEPCID) 20 MG tablet, Take 1 tablet (20 mg total) by mouth 2 (two) times daily., Disp: , Rfl:    furosemide (LASIX) 20 MG tablet, Take 1 tablet (20 mg total) by mouth daily., Disp: 30 tablet, Rfl:    Glucagon (GVOKE HYPOPEN 1-PACK) 1 MG/0.2ML SOAJ, Inject 1 mg into the skin daily as needed., Disp: , Rfl:    Insulin Detemir (LEVEMIR) 100 UNIT/ML Pen, Inject 25 Units into the skin 2 (two) times daily. Inject 25 units at 8 AM and then inject 20 units at 5 PM, Disp: , Rfl:    ketoconazole (NIZORAL) 2 % cream, Apply 1 application topically 2 (two) times daily as needed for irritation., Disp: 80 g, Rfl: 0   levothyroxine (SYNTHROID) 100 MCG tablet, Take 100 mcg by mouth daily before breakfast. , Disp: , Rfl:    lisinopril (PRINIVIL,ZESTRIL) 5 MG tablet, Take 5 mg by mouth daily., Disp: , Rfl:    LORazepam (ATIVAN) 1 MG tablet, Take 1 mg by mouth 3 (three) times daily., Disp: , Rfl:    Melatonin 3 MG SUBL, Take 10 tablets by mouth at bedtime., Disp: , Rfl:    metFORMIN (GLUCOPHAGE) 1000 MG tablet, Take 1 tablet (1,000 mg total) by mouth 2 (two) times daily., Disp: , Rfl:    metFORMIN (GLUCOPHAGE) 500 MG tablet, Take 500 mg by  mouth 2 (two) times daily., Disp: , Rfl:    Multiple Vitamins-Minerals (MULTIVITAMIN WITH MINERALS) tablet, Take 1 tablet by mouth daily., Disp: , Rfl:    Oxcarbazepine (TRILEPTAL) 300 MG tablet, Take 300 mg by mouth 2 (two) times daily., Disp: , Rfl:    potassium chloride (K-DUR,KLOR-CON) 10 MEQ tablet, Take 1 tablet (10 mEq total) by mouth daily., Disp: , Rfl:    potassium chloride (KLOR-CON) 10 MEQ tablet, Take 10 mEq by mouth daily., Disp: , Rfl:    prazosin (MINIPRESS) 1 MG capsule, Take 1 mg by mouth at bedtime., Disp: , Rfl:    QUEtiapine (SEROQUEL) 100 MG tablet, Take 150 mg by mouth at bedtime., Disp: , Rfl:    tiotropium (SPIRIVA) 18 MCG inhalation capsule, Place 18 mcg  into inhaler and inhale daily., Disp: , Rfl:    traZODone (DESYREL) 50 MG tablet, Take 50 mg by mouth at bedtime., Disp: , Rfl:    trihexyphenidyl (ARTANE) 2 MG tablet, Take 2 mg by mouth daily as needed., Disp: , Rfl:    VRAYLAR 3 MG capsule, Take 3 mg by mouth daily., Disp: , Rfl:    zolpidem (AMBIEN) 5 MG tablet, Take 5 mg by mouth at bedtime as needed., Disp: , Rfl:    diphenhydrAMINE (BENADRYL ALLERGY) 25 MG tablet, Take 1 tablet (25 mg total) by mouth at bedtime. (Patient not taking: Reported on 12/29/2020), Disp: 30 tablet, Rfl: 0   DULoxetine (CYMBALTA) 30 MG capsule, Take 1 capsule (30 mg total) by mouth daily., Disp: 30 capsule, Rfl: 0   haloperidol (HALDOL) 0.5 MG tablet, Take 1 tablet (0.5 mg total) by mouth every morning., Disp: 30 tablet, Rfl: 0   haloperidol (HALDOL) 2 MG tablet, Take 2 tablets (4 mg total) by mouth at bedtime., Disp: 60 tablet, Rfl: 0   haloperidol decanoate (HALDOL DECANOATE) 100 MG/ML injection, Inject 0.25 mLs (25 mg total) into the muscle every 28 (twenty-eight) days for 1 day., Disp: 1 mL, Rfl: 0  PAST MEDICAL HISTORY: Past Medical History:  Diagnosis Date   COPD (chronic obstructive pulmonary disease) (HCC)    CVA (cerebral infarction)    Depression    Hypertension     Hypothyroidism    Schizoaffective disorder, bipolar type (HCC)    Schizophrenia (HCC)    Stroke (HCC)    Tardive dyskinesia    Tardive dyskinesia     PAST SURGICAL HISTORY: Past Surgical History:  Procedure Laterality Date   NO PAST SURGERIES      FAMILY HISTORY: Family History  Family history unknown: Yes    SOCIAL HISTORY: Social History   Socioeconomic History   Marital status: Single    Spouse name: Not on file   Number of children: Not on file   Years of education: Not on file   Highest education level: Not on file  Occupational History   Not on file  Tobacco Use   Smoking status: Every Day    Packs/day: 1.00    Types: Cigarettes   Smokeless tobacco: Never  Vaping Use   Vaping Use: Never used  Substance and Sexual Activity   Alcohol use: No   Drug use: No   Sexual activity: Not Currently  Other Topics Concern   Not on file  Social History Narrative   Lives at [facility]   Social Determinants of Health   Financial Resource Strain: Low Risk  (12/03/2018)   Overall Financial Resource Strain (CARDIA)    Difficulty of Paying Living Expenses: Not hard at all  Food Insecurity: No Food Insecurity (12/03/2018)   Hunger Vital Sign    Worried About Running Out of Food in the Last Year: Never true    Ran Out of Food in the Last Year: Never true  Transportation Needs: No Transportation Needs (12/03/2018)   PRAPARE - Administrator, Civil Service (Medical): No    Lack of Transportation (Non-Medical): No  Physical Activity: Inactive (12/03/2018)   Exercise Vital Sign    Days of Exercise per Week: 0 days    Minutes of Exercise per Session: 0 min  Stress: No Stress Concern Present (12/03/2018)   [employer] of Occupational Health - Occupational Stress Questionnaire    Feeling of Stress : Not at all  Social Connections: Socially Isolated (12/03/2018)  Social Advertising account executive [NHANES]    Frequency of Communication with Friends  and Family: Once a week    Frequency of Social Gatherings with Friends and Family: Once a week    Attends Religious Services: Never    Database administrator or Organizations: No    Attends Banker Meetings: Never    Marital Status: Never married  Intimate Partner Violence: Not At Risk (12/03/2018)   Humiliation, Afraid, Rape, and Kick questionnaire    Fear of Current or Ex-Partner: No    Emotionally Abused: No    Physically Abused: No    Sexually Abused: No       PHYSICAL EXAM  Vitals:   05/14/22 1017  BP: (!) 139/58  Pulse: 66  Weight: 162 lb 0.8 oz (73.5 kg)  Height: 5\' 4"  (1.626 m)    Body mass index is 27.82 kg/m.   General: The patient is well-developed.  Not in any acute distress  HEENT:  Head is [place]/AT.  Sclera are anicteric.   Neck: No carotid bruits are noted.  The neck is nontender.  Range of motion of the neck was reduced  Cardiovascular: The heart has a regular rate and rhythm with a normal S1 and S2. There were no murmurs, gallops or rubs.    Skin: Extremities are without rash or  edema.  Musculoskeletal: Mild tenderness in the cervical paraspinal muscles.  Neurologic Exam  Mental status: She does not maintain eye contact.  The patient is alert and oriented to name but not to the date or place.  She follows 1 and two-step commands well.  Fund of knowledge appeared reduced.  Could not assess memory.  Focus/attention was poor.   Speech is normal.  Cranial nerves: Extraocular movements are full. Pupils are equal, round, and reactive to light and accomodation.  Visual fields are full.  Facial symmetry is present. There is good facial sensation to soft touch bilaterally.Facial strength is normal.  Trapezius and sternocleidomastoid strength is normal.  Voice is mildly dysarthric..  The tongue is midline, and the patient has symmetric elevation of the soft palate. No obvious hearing deficits are noted.  Motor:  Muscle bulk is normal.   Tone is  normal. Strength is  5 / 5 in all 4 extremities.   Sensory: Sensory testing is intact to pinprick, soft touch and vibration sensation in the arms.  She appears to have reduced vibration in the feet but ankle vibration was probably fairly normal (due to her schizoaffective disorder and other cognitive issues, testing was difficult)  Coordination: Cerebellar testing reveals good finger-nose-finger and reduced heel-to-shin bilaterally.  Gait and station: Station is normal.   Her gait has a good stride and she can turn in 2 steps.  Tandem gait is wide.  Romberg is negative.   Reflexes: Deep tendon reflexes are symmetric and normal bilaterally.   Plantar responses are flexor.    DIAGNOSTIC DATA (LABS, IMAGING, TESTING) - I reviewed patient records, labs, notes, testing and imaging myself where available.  Lab Results  Component Value Date   WBC 8.0 12/29/2020   HGB 9.7 (L) 12/29/2020   HCT 31.5 (L) 12/29/2020   MCV 86.1 12/29/2020   PLT 352 12/29/2020      Component Value Date/Time   NA 133 (L) 12/29/2020 1255   K 4.4 12/29/2020 1255   CL 96 (L) 12/29/2020 1255   CO2 29 12/29/2020 1255   GLUCOSE 95 12/29/2020 1255   BUN 23 (H) 12/29/2020 1255  CREATININE 1.32 (H) 12/29/2020 1255   CALCIUM 9.2 12/29/2020 1255   PROT 6.7 12/29/2020 1255   ALBUMIN 3.5 12/29/2020 1255   AST 22 12/29/2020 1255   ALT 20 12/29/2020 1255   ALKPHOS 107 12/29/2020 1255   BILITOT 0.5 12/29/2020 1255   GFRNONAA 47 (L) 12/29/2020 1255   GFRAA >60 11/12/2019 1514   No results found for: "CHOL", "HDL", "LDLCALC", "LDLDIRECT", "TRIG", "CHOLHDL" Lab Results  Component Value Date   HGBA1C 10.3 (H) 11/12/2019   No results found for: "VITAMINB12" Lab Results  Component Value Date   TSH 3.934 11/12/2019       ASSESSMENT AND PLAN  Schizoaffective disorder, bipolar type (HCC)  Diabetic polyneuropathy associated with type 1 diabetes mellitus (HCC)  Episode of behavior change  Gait  disturbance   Dementia is likely mostly related to her schizophrenia.  There has not been dramatic change in the last couple years according to caregivers and a superimposed neurodegenerative process such as Alzheimer's is not likely.  However, if dementia accelerates further consider doing blood biomarkers for Alzheimer's.  In the meantime she will continue donepezil 10 mg. She appears to have a neuropathy in her feet but it is not severe enough to affect her balance when she closes her eyes.  Diabetes is the likely cause.  If she develops pain because of the neuropathy we could always consider adding an agent such as gabapentin or lamotrigine.  If balance worsens much more consider other causes of large fiber polyneuropathy and then check B12, SPEP/IEF. She has significant degenerative changes in her cervical spine.  Fortunately she appears to have a wide spinal canal.  There were no long track signs and she had a good stride so severe spinal stenosis is unlikely in the neck She will return to see us in a year or sooner if there are new or worsening neurologic symptoms.  We can change the visit to prn if another provider right to the donepezil.    Tylyn Stankovich A. Epimenio FootSater, MD, [facility],FAAN 05/14/2022, 2:23 PM Certified in Neurology, Clinical Neurophysiology, Sleep Medicine and Neuroimaging  [facility] [address], [address] [place], [place] [address] [phone]

## 2022-05-14 ENCOUNTER — Encounter: Payer: Self-pay | Admitting: Neurology

## 2022-05-14 ENCOUNTER — Ambulatory Visit (INDEPENDENT_AMBULATORY_CARE_PROVIDER_SITE_OTHER): Payer: Medicare Other | Admitting: Neurology

## 2022-05-14 VITALS — BP 139/58 | HR 66 | Ht 64.0 in | Wt 162.1 lb

## 2022-05-14 DIAGNOSIS — R4689 Other symptoms and signs involving appearance and behavior: Secondary | ICD-10-CM

## 2022-05-14 DIAGNOSIS — R269 Unspecified abnormalities of gait and mobility: Secondary | ICD-10-CM

## 2022-05-14 DIAGNOSIS — F250 Schizoaffective disorder, bipolar type: Secondary | ICD-10-CM | POA: Diagnosis not present

## 2022-05-14 DIAGNOSIS — E1042 Type 1 diabetes mellitus with diabetic polyneuropathy: Secondary | ICD-10-CM

## 2022-05-31 ENCOUNTER — Ambulatory Visit (INDEPENDENT_AMBULATORY_CARE_PROVIDER_SITE_OTHER): Payer: Medicare Other | Admitting: Podiatry

## 2022-05-31 ENCOUNTER — Encounter: Payer: Self-pay | Admitting: Podiatry

## 2022-05-31 ENCOUNTER — Ambulatory Visit: Payer: Medicare Other | Admitting: Podiatry

## 2022-05-31 DIAGNOSIS — B351 Tinea unguium: Secondary | ICD-10-CM | POA: Diagnosis not present

## 2022-05-31 DIAGNOSIS — M79674 Pain in right toe(s): Secondary | ICD-10-CM

## 2022-05-31 DIAGNOSIS — M79675 Pain in left toe(s): Secondary | ICD-10-CM

## 2022-05-31 DIAGNOSIS — Z794 Long term (current) use of insulin: Secondary | ICD-10-CM

## 2022-05-31 DIAGNOSIS — E119 Type 2 diabetes mellitus without complications: Secondary | ICD-10-CM

## 2022-05-31 NOTE — Progress Notes (Signed)
This patient returns to my office for at risk foot care.  This patient requires this care by a professional since this patient will be at risk due to having diabetes.  This patient is unable to cut nails himself since the patient cannot reach his nails.These nails are painful walking and wearing shoes.  This patient presents for at risk foot care today.  General Appearance  Alert, conversant and in no acute stress.  Vascular  Dorsalis pedis and posterior tibial  pulses are palpable  bilaterally.  Capillary return is within normal limits  bilaterally. Temperature is within normal limits  bilaterally.  Neurologic  Senn-Weinstein monofilament wire test within normal limits  bilaterally. Muscle power within normal limits bilaterally.  Nails Thick disfigured discolored nails with subungual debris  Hallux nails bilaterally. No evidence of bacterial infection or drainage bilaterally.  Orthopedic  No limitations of motion  feet .  No crepitus or effusions noted.  No bony pathology or digital deformities noted.  Skin  normotropic skin with no porokeratosis noted bilaterally.  No signs of infections or ulcers noted.     Onychomycosis  Pain in right toes  Pain in left toes  Consent was obtained for treatment procedures.   Mechanical debridement of nails 1-5  bilaterally performed with a nail nipper.  Filed with dremel without incident.    Return office visit    3  months                  Told patient to return for periodic foot care and evaluation due to potential at risk complications.   Gardiner Barefoot DPM

## 2022-06-14 ENCOUNTER — Encounter (HOSPITAL_COMMUNITY): Payer: Self-pay

## 2022-06-14 ENCOUNTER — Emergency Department (HOSPITAL_COMMUNITY)
Admission: EM | Admit: 2022-06-14 | Discharge: 2022-06-14 | Disposition: A | Discharge status: Skilled Nursing Facility | Payer: Medicare Other | Attending: Emergency Medicine | Admitting: Emergency Medicine

## 2022-06-14 DIAGNOSIS — E1165 Type 2 diabetes mellitus with hyperglycemia: Secondary | ICD-10-CM | POA: Diagnosis not present

## 2022-06-14 DIAGNOSIS — R7989 Other specified abnormal findings of blood chemistry: Secondary | ICD-10-CM

## 2022-06-14 DIAGNOSIS — E871 Hypo-osmolality and hyponatremia: Secondary | ICD-10-CM | POA: Diagnosis not present

## 2022-06-14 DIAGNOSIS — J449 Chronic obstructive pulmonary disease, unspecified: Secondary | ICD-10-CM | POA: Insufficient documentation

## 2022-06-14 DIAGNOSIS — F0390 Unspecified dementia without behavioral disturbance: Secondary | ICD-10-CM | POA: Diagnosis not present

## 2022-06-14 DIAGNOSIS — I10 Essential (primary) hypertension: Secondary | ICD-10-CM | POA: Insufficient documentation

## 2022-06-14 DIAGNOSIS — R944 Abnormal results of kidney function studies: Secondary | ICD-10-CM | POA: Diagnosis not present

## 2022-06-14 DIAGNOSIS — Z7984 Long term (current) use of oral hypoglycemic drugs: Secondary | ICD-10-CM | POA: Diagnosis not present

## 2022-06-14 DIAGNOSIS — R739 Hyperglycemia, unspecified: Secondary | ICD-10-CM

## 2022-06-14 DIAGNOSIS — Z794 Long term (current) use of insulin: Secondary | ICD-10-CM | POA: Diagnosis not present

## 2022-06-14 LAB — BASIC METABOLIC PANEL
Anion gap: 13 (ref 5–15)
BUN: 34 mg/dL — ABNORMAL HIGH (ref 6–20)
CO2: 28 mmol/L (ref 22–32)
Calcium: 9.2 mg/dL (ref 8.9–10.3)
Chloride: 91 mmol/L — ABNORMAL LOW (ref 98–111)
Creatinine, Ser: 1.75 mg/dL — ABNORMAL HIGH (ref 0.44–1.00)
GFR, Estimated: 33 mL/min — ABNORMAL LOW (ref >60)
Glucose, Bld: 422 mg/dL — ABNORMAL HIGH (ref 70–99)
Potassium: 4.3 mmol/L (ref 3.5–5.1)
Sodium: 132 mmol/L — ABNORMAL LOW (ref 135–145)

## 2022-06-14 LAB — CBG MONITORING, ED
Glucose-Capillary: 141 mg/dL — ABNORMAL HIGH (ref 70–99)
Glucose-Capillary: 432 mg/dL — ABNORMAL HIGH (ref 70–99)
Glucose-Capillary: 274 mg/dL — ABNORMAL HIGH (ref 70–99)
Glucose-Capillary: 203 mg/dL — ABNORMAL HIGH (ref 70–99)

## 2022-06-14 LAB — CBC
HCT: 34.5 % — ABNORMAL LOW (ref 36.0–46.0)
Hemoglobin: 10.9 g/dL — ABNORMAL LOW (ref 12.0–15.0)
MCH: 29.9 pg (ref 26.0–34.0)
MCHC: 31.6 g/dL (ref 30.0–36.0)
MCV: 94.5 fL (ref 80.0–100.0)
Platelets: 194 10*3/uL (ref 150–400)
RBC: 3.65 MIL/uL — ABNORMAL LOW (ref 3.87–5.11)
RDW: 13.8 % (ref 11.5–15.5)
WBC: 7.2 10*3/uL (ref 4.0–10.5)
nRBC: 0 % (ref 0.0–0.2)

## 2022-06-14 MED ORDER — SODIUM CHLORIDE 0.9 % IV BOLUS
1000.0000 mL | Freq: Once | INTRAVENOUS | Status: AC
  Administered 2022-06-14: 1000 mL via INTRAVENOUS

## 2022-06-14 MED ORDER — INSULIN ASPART 100 UNIT/ML IJ SOLN
8.0000 [IU] | Freq: Once | INTRAMUSCULAR | Status: AC
  Administered 2022-06-14: 8 [IU] via SUBCUTANEOUS
  Filled 2022-06-14: qty 1

## 2022-06-14 NOTE — Discharge Instructions (Addendum)
As we discussed, Carrie Clark's workup in the ER today was reassuring for acute findings.  We were able to get her blood glucose down to a manageable range with fluids and insulin.  I recommend close monitoring of her blood sugars over the next few days.  She did also have a slight elevation in her kidney function or creatinine/BUN levels which could be due to dehydration.  However they do need to be rechecked in the next few days by her primary care doctor.  Please schedule an appointment with her PCP at your earliest convenience.  Return if development of any new or worsening symptoms.

## 2022-06-14 NOTE — ED Notes (Signed)
Chance, DSS legal guardian came to see pt and reports she seems to be neurologically at her baseline.

## 2022-06-14 NOTE — ED Notes (Signed)
EMS called for convo transport
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
## 2022-06-14 NOTE — ED Notes (Signed)
Pt cleaned up of urinary incontinence. Brief changed. Linens changed. Purewick placed on patient.

## 2022-06-14 NOTE — ED Triage Notes (Signed)
Pt BIB [facility] from [facility] (independent living/group home) C/O hyperglycemia for several days. CBG reading "High" for EMS. Hx dementia, AMS, at baseline neurologically per EMS.   Call administrator of facility, [place] Nation, [phone] with updates.

## 2022-06-17 ENCOUNTER — Encounter: Payer: Self-pay | Admitting: Podiatry

## 2022-07-05 ENCOUNTER — Emergency Department (HOSPITAL_COMMUNITY): Payer: Medicare Other

## 2022-07-05 ENCOUNTER — Encounter (HOSPITAL_COMMUNITY): Payer: Self-pay | Admitting: Emergency Medicine

## 2022-07-05 ENCOUNTER — Emergency Department (HOSPITAL_COMMUNITY)
Admission: EM | Admit: 2022-07-05 | Discharge: 2022-07-05 | Disposition: A | Discharge status: Home / Self Care | Payer: Medicare Other | Attending: Emergency Medicine | Admitting: Emergency Medicine

## 2022-07-05 ENCOUNTER — Other Ambulatory Visit: Payer: Self-pay

## 2022-07-05 DIAGNOSIS — R7309 Other abnormal glucose: Secondary | ICD-10-CM | POA: Diagnosis not present

## 2022-07-05 DIAGNOSIS — N289 Disorder of kidney and ureter, unspecified: Secondary | ICD-10-CM | POA: Diagnosis not present

## 2022-07-05 DIAGNOSIS — R0602 Shortness of breath: Secondary | ICD-10-CM | POA: Diagnosis not present

## 2022-07-05 LAB — CBC WITH DIFFERENTIAL/PLATELET
Abs Immature Granulocytes: 0.01 10*3/uL (ref 0.00–0.07)
Basophils Absolute: 0 10*3/uL (ref 0.0–0.1)
Basophils Relative: 1 %
Eosinophils Absolute: 0.2 10*3/uL (ref 0.0–0.5)
Eosinophils Relative: 3 %
HCT: 32.1 % — ABNORMAL LOW (ref 36.0–46.0)
Hemoglobin: 10.4 g/dL — ABNORMAL LOW (ref 12.0–15.0)
Immature Granulocytes: 0 %
Lymphocytes Relative: 25 %
Lymphs Abs: 1.4 10*3/uL (ref 0.7–4.0)
MCH: 29.8 pg (ref 26.0–34.0)
MCHC: 32.4 g/dL (ref 30.0–36.0)
MCV: 92 fL (ref 80.0–100.0)
Monocytes Absolute: 0.5 10*3/uL (ref 0.1–1.0)
Monocytes Relative: 8 %
Neutro Abs: 3.5 10*3/uL (ref 1.7–7.7)
Neutrophils Relative %: 63 %
Platelets: 231 10*3/uL (ref 150–400)
RBC: 3.49 MIL/uL — ABNORMAL LOW (ref 3.87–5.11)
RDW: 14.4 % (ref 11.5–15.5)
WBC: 5.5 10*3/uL (ref 4.0–10.5)
nRBC: 0 % (ref 0.0–0.2)

## 2022-07-05 LAB — CBG MONITORING, ED: Glucose-Capillary: 409 mg/dL — ABNORMAL HIGH (ref 70–99)

## 2022-07-05 LAB — COMPREHENSIVE METABOLIC PANEL
ALT: 22 U/L (ref 0–44)
AST: 23 U/L (ref 15–41)
Albumin: 3.7 g/dL (ref 3.5–5.0)
Alkaline Phosphatase: 136 U/L — ABNORMAL HIGH (ref 38–126)
Anion gap: 12 (ref 5–15)
BUN: 40 mg/dL — ABNORMAL HIGH (ref 6–20)
CO2: 32 mmol/L (ref 22–32)
Calcium: 9.1 mg/dL (ref 8.9–10.3)
Chloride: 86 mmol/L — ABNORMAL LOW (ref 98–111)
Creatinine, Ser: 2.22 mg/dL — ABNORMAL HIGH (ref 0.44–1.00)
GFR, Estimated: 25 mL/min — ABNORMAL LOW (ref >60)
Glucose, Bld: 512 mg/dL (ref 70–99)
Potassium: 4.7 mmol/L (ref 3.5–5.1)
Sodium: 130 mmol/L — ABNORMAL LOW (ref 135–145)
Total Bilirubin: 0.4 mg/dL (ref 0.3–1.2)
Total Protein: 6.7 g/dL (ref 6.5–8.1)

## 2022-07-05 MED ORDER — INSULIN ASPART 100 UNIT/ML IJ SOLN
10.0000 [IU] | Freq: Once | INTRAMUSCULAR | Status: AC
  Administered 2022-07-05: 10 [IU] via SUBCUTANEOUS
  Filled 2022-07-05: qty 1

## 2022-07-05 MED ORDER — HALOPERIDOL LACTATE 5 MG/ML IJ SOLN
5.0000 mg | Freq: Once | INTRAMUSCULAR | Status: AC
  Administered 2022-07-05: 5 mg via INTRAVENOUS
  Filled 2022-07-05: qty 1

## 2022-07-05 MED ORDER — IPRATROPIUM-ALBUTEROL 0.5-2.5 (3) MG/3ML IN SOLN
RESPIRATORY_TRACT | Status: AC
  Administered 2022-07-05: 3 mL via RESPIRATORY_TRACT
  Filled 2022-07-05: qty 3

## 2022-07-05 MED ORDER — SODIUM CHLORIDE 0.9 % IV BOLUS
1000.0000 mL | Freq: Once | INTRAVENOUS | Status: AC
  Administered 2022-07-05: 1000 mL via INTRAVENOUS

## 2022-07-05 MED ORDER — ALBUTEROL SULFATE HFA 108 (90 BASE) MCG/ACT IN AERS: 2.0000 | INHALATION_SPRAY | RESPIRATORY_TRACT | Status: DC | PRN

## 2022-07-05 MED ORDER — LORAZEPAM 2 MG/ML IJ SOLN
1.0000 mg | Freq: Once | INTRAMUSCULAR | Status: AC
  Administered 2022-07-05: 1 mg via INTRAVENOUS
  Filled 2022-07-05: qty 1

## 2022-07-05 MED ORDER — IPRATROPIUM-ALBUTEROL 0.5-2.5 (3) MG/3ML IN SOLN: 3.0000 mL | Freq: Once | RESPIRATORY_TRACT | Status: AC

## 2022-07-05 MED ORDER — INSULIN ASPART 100 UNIT/ML IV SOLN: 10.0000 [IU] | Freq: Once | INTRAVENOUS | Status: DC

## 2022-07-05 NOTE — Discharge Instructions (Signed)
Your testing has been reassuring except for your high blood sugar.  Please make sure that you are taking your insulin, drinking plenty of clear liquids and eating a diabetic friendly diet.  Go back to the ER immediately for recheck if things get worse.  Otherwise I do want you to follow-up with your family doctor for a recheck of your kidney function which is a blood test that needs to be done within the next 7 days

## 2022-07-05 NOTE — ED Triage Notes (Signed)
Pt bib [facility] for sob from [facility]. Pt hollering and upset that she got brought to ED and would not let EMS do anything for her per ems. Pt a/o to most. Pt is at her baseline. Pt advised why they brought her here. Pt stated she was short of breath but is better now. Pt continuously hollering/talking. No resp distress or sob noted. Pt is wheezing throughout. Pt somewhat cooperative. Would not let us put gown on

## 2022-07-05 NOTE — ED Notes (Signed)
Notified EDP of glucose of 512

## 2022-07-05 NOTE — ED Notes (Signed)
Pt assisted to restroom with stand by assist.  Pt ambulated without difficulty and tv was turned on and chair provided for pt while she awaits transport home

## 2022-07-05 NOTE — ED Notes (Signed)
Jamas Lav called and notified that we do not [place] tonight to bring pt back to facility. Jamas Lav stated she does not have anyone to send to pick up pt tonight, that she will try to send someone in the morning.
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
## 2022-07-05 NOTE — ED Provider Notes (Signed)
[place] Provider Note   CSN: [id number] Arrival date & time: 07/05/22  1351     History  Chief Complaint  Patient presents with   Shortness of Breath    Carrie Clark is a 62 y.o. female.   Shortness of Breath  Patient is a 62 year old female, she has a history of some underlying mental health disorders, she presents from her facility after she had stated that she was short of breath.  The patient is not currently agitated, she feels much more comfortable, she was given some medications to help with her anxiety and agitation, she was given a nebulizer treatment, she was given subcutaneous insulin and is feeling much better.  She has not had any fevers or swelling of the legs.  It is difficult to get information out of her    Home Medications Prior to Admission medications   Medication Sig Start Date End Date Taking? Authorizing Provider  acetaminophen (TYLENOL) 325 MG tablet Take 2 tablets (650 mg total) by mouth every 6 (six) hours as needed for moderate pain. 01/29/19   Mesner, Corene Cornea, MD  albuterol (PROVENTIL HFA;VENTOLIN HFA) 108 (90 Base) MCG/ACT inhaler Inhale 2 puffs into the lungs every 6 (six) hours as needed for wheezing or shortness of breath. 06/07/15   Mesner, Corene Cornea, MD  atorvastatin (LIPITOR) 40 MG tablet Take 40 mg by mouth at bedtime.     [provider]  atorvastatin (LIPITOR) 40 MG tablet Take 40 mg by mouth daily.    [provider]  AUSTEDO 9 MG TABS Take 2 tablets by mouth 2 (two) times daily. 12/21/20   [provider]  benztropine (COGENTIN) 1 MG tablet Take 1 mg by mouth 2 (two) times daily.     [provider]  cephALEXin (KEFLEX) 500 MG capsule Take 1 capsule (500 mg total) by mouth 2 (two) times daily. 06/08/21   Volney American, PA-C  cetirizine (ZYRTEC) 10 MG chewable tablet Chew 10 mg by mouth daily.    [provider]  cetirizine (ZYRTEC) 10 MG tablet Take 1  tablet (10 mg total) by mouth daily. 11/11/14   Withrow, Elyse Jarvis, FNP  Deutetrabenazine (AUSTEDO) 12 MG TABS Take 12 mg by mouth in the morning and at bedtime.    [provider]  Deutetrabenazine (AUSTEDO) 9 MG TABS Take by mouth.    [provider]  diazepam (VALIUM) 10 MG tablet Take 10 mg by mouth every 6 (six) hours as needed for anxiety.    [provider]  diphenhydrAMINE (BENADRYL ALLERGY) 25 MG tablet Take 1 tablet (25 mg total) by mouth at bedtime. Patient not taking: Reported on 12/29/2020 11/17/19 12/17/19  Vanessa Penfield, MD  diphenoxylate-atropine (LOMOTIL) 2.5-0.025 MG tablet Take 1 tablet by mouth 4 (four) times daily as needed for diarrhea or loose stools.    [provider]  donepezil (ARICEPT) 10 MG tablet Take 10 mg by mouth at bedtime.    [provider]  donepezil (ARICEPT) 5 MG tablet Take 5 mg by mouth at bedtime.    [provider]  DULoxetine (CYMBALTA) 30 MG capsule Take 1 capsule (30 mg total) by mouth daily. 11/17/19 12/29/20  Vanessa [place], MD  DULoxetine (CYMBALTA) 30 MG capsule Take 30 mg by mouth daily.    [provider]  famotidine (PEPCID) 20 MG tablet Take 1 tablet (20 mg total) by mouth 2 (two) times daily. 11/11/14   Withrow, Elyse Jarvis, FNP  famotidine (PEPCID) 20 MG tablet Take 20 mg by mouth 2 (two) times daily.    [provider]  furosemide (LASIX) 20 MG tablet Take 1 tablet (20 mg total) by mouth daily. 11/11/14   Withrow, Elyse Jarvis, FNP  Glucagon (GVOKE HYPOPEN 1-PACK) 1 MG/0.2ML SOAJ Inject 1 mg into the skin daily as needed.    [provider]  glycopyrrolate (ROBINUL) 1 MG tablet Take 1 mg by mouth 3 (three) times daily.    [provider]  haloperidol (HALDOL) 0.5 MG tablet Take 1 tablet (0.5 mg total) by mouth every morning. 11/17/19 12/29/20  Vanessa [place], MD  haloperidol (HALDOL) 2 MG tablet Take 2 tablets (4 mg total) by mouth at bedtime. 11/17/19 12/29/20  Vanessa [place], MD   haloperidol decanoate (HALDOL DECANOATE) 100 MG/ML injection Inject 0.25 mLs (25 mg total) into the muscle every 28 (twenty-eight) days for 1 day. 12/17/19 12/29/20  Vanessa [place], MD  insulin aspart (NOVOLOG) 100 UNIT/ML injection Inject 1-10 Units into the skin 3 (three) times daily before meals. Sliding scale    [provider]  insulin detemir (LEVEMIR) 100 UNIT/ML injection Inject 20-25 Units into the skin See admin instructions. Inject 25 units at 8 am and 20 units 5 pm    [provider]  Insulin Detemir (LEVEMIR) 100 UNIT/ML Pen Inject 25 Units into the skin 2 (two) times daily. Inject 25 units at 8 AM and then inject 20 units at 5 PM    [provider]  ketoconazole (NIZORAL) 2 % cream Apply 1 application topically 2 (two) times daily as needed for irritation. 06/08/21   Volney American, PA-C  levothyroxine (SYNTHROID) 100 MCG tablet Take 100 mcg by mouth daily before breakfast.     [provider]  levothyroxine (SYNTHROID) 100 MCG tablet Take 100 mcg by mouth daily before breakfast.    [provider]  lisinopril (PRINIVIL,ZESTRIL) 5 MG tablet Take 5 mg by mouth daily.    [provider]  lisinopril (ZESTRIL) 5 MG tablet Take 5 mg by mouth daily.    [provider]  LORazepam (ATIVAN) 1 MG tablet Take 1 mg by mouth 3 (three) times daily. 12/28/20   [provider]  Melatonin 3 MG SUBL Take 10 tablets by mouth at bedtime. 11/29/20   [provider]  metFORMIN (GLUCOPHAGE) 1000 MG tablet Take 1 tablet (1,000 mg total) by mouth 2 (two) times daily. 09/11/15   Patrecia Pour, NP  metFORMIN (GLUCOPHAGE) 500 MG tablet Take 500 mg by mouth 2 (two) times daily. 12/29/20   [provider]  metFORMIN (GLUCOPHAGE) 500 MG tablet Take 500 mg by mouth 2 (two) times daily with a meal.    [provider]  Multiple Vitamins-Minerals (MULTIVITAMIN WITH MINERALS) tablet Take 1 tablet by mouth daily. 11/11/14    Withrow, Elyse Jarvis, FNP  Oxcarbazepine (TRILEPTAL) 300 MG tablet Take 300 mg by mouth 2 (two) times daily. 12/28/20   [provider]  potassium chloride (K-DUR,KLOR-CON) 10 MEQ tablet Take 1 tablet (10 mEq total) by mouth daily. 11/11/14   Withrow, Elyse Jarvis, FNP  potassium chloride (KLOR-CON) 10 MEQ tablet Take 10 mEq by mouth daily. 12/28/20   [provider]  prazosin (MINIPRESS) 1 MG capsule Take 1 mg by mouth at bedtime. 12/28/20   [provider]  QUEtiapine (SEROQUEL) 100 MG tablet Take 150 mg by mouth at bedtime. 12/27/20   [provider]  tiotropium (SPIRIVA) 18 MCG inhalation  capsule Place 18 mcg into inhaler and inhale daily.    [provider]  traZODone (DESYREL) 50 MG tablet Take 50 mg by mouth at bedtime. 12/28/20   [provider]  trihexyphenidyl (ARTANE) 2 MG tablet Take 2 mg by mouth daily as needed. 12/27/20   [provider]  VRAYLAR 3 MG capsule Take 3 mg by mouth daily. 11/29/20   [provider]  zolpidem (AMBIEN) 5 MG tablet Take 5 mg by mouth at bedtime as needed. 12/28/20   [provider]  fluPHENAZine (PROLIXIN) 10 MG tablet Take 1 tablet (10 mg total) by mouth 3 (three) times daily. 12/03/18 11/17/19  Bettey Costa, MD  fluPHENAZine decanoate (PROLIXIN) 25 MG/ML injection Inject 50 mg into the muscle every 28 (twenty-eight) days.  11/17/19  [provider]  lurasidone (LATUDA) 20 MG TABS tablet Take 20 mg by mouth daily in the afternoon.   11/17/19  [provider]      Allergies    Fish allergy    Review of Systems   Review of Systems  Respiratory:  Positive for shortness of breath.   All other systems reviewed and are negative.   Physical Exam Updated Vital Signs BP (!) 110/92 (BP Location: Left Arm)   Pulse 84   Temp 97.8 F (36.6 C) (Oral)   Resp 18   SpO2 97%  Physical Exam Vitals and nursing note reviewed.  Constitutional:      General: She is not in acute distress.     Appearance: She is well-developed.  HENT:     Head: Normocephalic and atraumatic.     Mouth/Throat:     Pharynx: No oropharyngeal exudate.  Eyes:     General: No scleral icterus.       Right eye: No discharge.        Left eye: No discharge.     Conjunctiva/sclera: Conjunctivae normal.     Pupils: Pupils are equal, round, and reactive to light.  Neck:     Thyroid: No thyromegaly.     Vascular: No JVD.  Cardiovascular:     Rate and Rhythm: Normal rate and regular rhythm.     Heart sounds: Normal heart sounds. No murmur heard.    No friction rub. No gallop.  Pulmonary:     Effort: Pulmonary effort is normal. No respiratory distress.     Breath sounds: Wheezing present. No rales.  Abdominal:     General: Bowel sounds are normal. There is no distension.     Palpations: Abdomen is soft. There is no mass.     Tenderness: There is no abdominal tenderness.  Musculoskeletal:        General: No tenderness. Normal range of motion.     Cervical back: Normal range of motion and neck supple.  Lymphadenopathy:     Cervical: No cervical adenopathy.  Skin:    General: Skin is warm and dry.     Findings: No erythema or rash.  Neurological:     Mental Status: She is alert.     Coordination: Coordination normal.  Psychiatric:        Behavior: Behavior normal.     ED Results / Procedures / Treatments   Labs (all labs ordered are listed, but only abnormal results are displayed) Labs Reviewed  CBC WITH DIFFERENTIAL/PLATELET - Abnormal; Notable for the following components:      Result Value   RBC 3.49 (*)    Hemoglobin 10.4 (*)    HCT 32.1 (*)  All other components within normal limits  COMPREHENSIVE METABOLIC PANEL - Abnormal; Notable for the following components:   Sodium 130 (*)    Chloride 86 (*)    Glucose, Bld 512 (*)    BUN 40 (*)    Creatinine, Ser 2.22 (*)    Alkaline Phosphatase 136 (*)    GFR, Estimated 25 (*)    All other components within normal limits  CBG  MONITORING, ED - Abnormal; Notable for the following components:   Glucose-Capillary 409 (*)    All other components within normal limits    EKG EKG Interpretation  Date/Time:  Friday July 05 2022 14:37:33 EST Ventricular Rate:  76 PR Interval:  199 QRS Duration: 130 QT Interval:  516 QTC Calculation: 585 R Axis:   27 Text Interpretation: Sinus rhythm axis equivocal. Nonspecific T wave abnormality nonspecific EKG Confirmed by Noemi Chapel [phone]) on 07/05/2022 3:08:42 PM  Radiology DG Chest Port 1 View  Result Date: 07/05/2022 CLINICAL DATA:  Shortness of breath EXAM: PORTABLE CHEST 1 VIEW COMPARISON:  Chest x-ray dated 05/15/2021 FINDINGS: Cardiac and mediastinal contours are unchanged. Unchanged mild elevation of the right hemidiaphragm. Lungs are clear. No evidence of pleural effusion or pneumothorax. IMPRESSION: No active disease. Electronically Signed   By: Yetta Glassman M.D.   On: 07/05/2022 15:02    Procedures Procedures    Medications Ordered in ED Medications  albuterol (VENTOLIN HFA) 108 (90 Base) MCG/ACT inhaler 2 puff (has no administration in time range)  ipratropium-albuterol (DUONEB) 0.5-2.5 (3) MG/3ML nebulizer solution 3 mL (3 mLs Nebulization Given 07/05/22 1521)  LORazepam (ATIVAN) injection 1 mg (1 mg Intravenous Given 07/05/22 1553)  sodium chloride 0.9 % bolus 1,000 mL (1,000 mLs Intravenous New Bag/Given 07/05/22 1813)  haloperidol lactate (HALDOL) injection 5 mg (5 mg Intravenous Given 07/05/22 1553)  insulin aspart (novoLOG) injection 10 Units (10 Units Subcutaneous Given 07/05/22 1643)    ED Course/ Medical Decision Making/ A&P                             Medical Decision Making Amount and/or Complexity of Data Reviewed Labs: ordered.  Risk Prescription drug management.   This patient presents to the ED for concern of shortness of breath differential diagnosis includes OPD, pneumonia, less likely to be pulmonary embolism, she is not tachycardic,  not hypoxic, not febrile    Additional history obtained:  Additional history obtained from electronic medical record External records from outside source obtained and reviewed including prior evaluations in the hospital, this includes ED visits for a variety of symptoms including hyperglycemia, agitation, she had even been seen for multiple falls, cystitis, admitted for a UTI several years ago,   Lab Tests:  I Ordered, and personally interpreted labs.  The pertinent results include: Hyperglycemia, no signs of DKA   Imaging Studies ordered:  I ordered imaging studies including chest x-ray I independently visualized and interpreted imaging which showed no acute findings I agree with the radiologist interpretation   Medicines ordered and prescription drug management:  I ordered medication including albuterol, insulin for hyperglycemia, also given IV fluids Reevaluation of the patient after these medicines showed that the patient improved I have reviewed the patients home medicines and have made adjustments as needed   Problem List / ED Course:  Improved significantly, the patient is very stable for discharge, agreeable   Social Determinants of Health:  Stable for discharge  Final Clinical Impression(s) / ED Diagnoses Final diagnoses:  Shortness of breath  Renal insufficiency    Rx / DC Orders ED Discharge Orders     None         Noemi Chapel, MD 07/05/22 1924
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
## 2022-07-05 NOTE — ED Notes (Signed)
Pt eating meal and drinking diet ginger ale.  Calm and cooperative at this time.

## 2022-07-05 NOTE — ED Notes (Signed)
RT called

## 2022-07-05 NOTE — ED Notes (Signed)
Called [place] the admin at pt's group home. She requested we arrange for pt's transport back to them.  Secretary notified

## 2022-08-22 ENCOUNTER — Ambulatory Visit: Payer: Medicare Other | Admitting: Podiatry

## 2022-09-05 ENCOUNTER — Ambulatory Visit (INDEPENDENT_AMBULATORY_CARE_PROVIDER_SITE_OTHER): Payer: Medicare Other | Admitting: Podiatry

## 2022-09-05 DIAGNOSIS — Z91199 Patient's noncompliance with other medical treatment and regimen due to unspecified reason: Secondary | ICD-10-CM

## 2022-09-05 NOTE — Progress Notes (Signed)
Pt was a no show for apt, charge generated 

## 2022-09-27 ENCOUNTER — Ambulatory Visit: Payer: Medicare Other | Admitting: Podiatry

## 2022-11-12 ENCOUNTER — Emergency Department (HOSPITAL_COMMUNITY): Payer: Medicare Other

## 2022-11-12 ENCOUNTER — Emergency Department (HOSPITAL_COMMUNITY)
Admission: EM | Admit: 2022-11-12 | Discharge: 2022-11-12 | Disposition: A | Discharge status: Home / Self Care | Payer: Medicare Other | Attending: Emergency Medicine | Admitting: Emergency Medicine

## 2022-11-12 ENCOUNTER — Encounter (HOSPITAL_COMMUNITY): Payer: Self-pay | Admitting: Emergency Medicine

## 2022-11-12 ENCOUNTER — Other Ambulatory Visit: Payer: Self-pay

## 2022-11-12 DIAGNOSIS — Z794 Long term (current) use of insulin: Secondary | ICD-10-CM | POA: Insufficient documentation

## 2022-11-12 DIAGNOSIS — Z79899 Other long term (current) drug therapy: Secondary | ICD-10-CM | POA: Diagnosis not present

## 2022-11-12 DIAGNOSIS — J449 Chronic obstructive pulmonary disease, unspecified: Secondary | ICD-10-CM | POA: Insufficient documentation

## 2022-11-12 DIAGNOSIS — R109 Unspecified abdominal pain: Secondary | ICD-10-CM | POA: Diagnosis present

## 2022-11-12 DIAGNOSIS — E039 Hypothyroidism, unspecified: Secondary | ICD-10-CM | POA: Insufficient documentation

## 2022-11-12 DIAGNOSIS — Z79890 Hormone replacement therapy (postmenopausal): Secondary | ICD-10-CM | POA: Diagnosis not present

## 2022-11-12 DIAGNOSIS — R1084 Generalized abdominal pain: Secondary | ICD-10-CM | POA: Insufficient documentation

## 2022-11-12 DIAGNOSIS — Z8673 Personal history of transient ischemic attack (TIA), and cerebral infarction without residual deficits: Secondary | ICD-10-CM | POA: Diagnosis not present

## 2022-11-12 DIAGNOSIS — I10 Essential (primary) hypertension: Secondary | ICD-10-CM | POA: Diagnosis not present

## 2022-11-12 LAB — CBC WITH DIFFERENTIAL/PLATELET
Abs Immature Granulocytes: 0.02 10*3/uL (ref 0.00–0.07)
Basophils Absolute: 0 10*3/uL (ref 0.0–0.1)
Basophils Relative: 0 %
Eosinophils Absolute: 0.4 10*3/uL (ref 0.0–0.5)
Eosinophils Relative: 4 %
HCT: 30.3 % — ABNORMAL LOW (ref 36.0–46.0)
Hemoglobin: 9.6 g/dL — ABNORMAL LOW (ref 12.0–15.0)
Immature Granulocytes: 0 %
Lymphocytes Relative: 29 %
Lymphs Abs: 2.7 10*3/uL (ref 0.7–4.0)
MCH: 29.9 pg (ref 26.0–34.0)
MCHC: 31.7 g/dL (ref 30.0–36.0)
MCV: 94.4 fL (ref 80.0–100.0)
Monocytes Absolute: 0.7 10*3/uL (ref 0.1–1.0)
Monocytes Relative: 7 %
Neutro Abs: 5.6 10*3/uL (ref 1.7–7.7)
Neutrophils Relative %: 60 %
Platelets: 190 10*3/uL (ref 150–400)
RBC: 3.21 MIL/uL — ABNORMAL LOW (ref 3.87–5.11)
RDW: 14.6 % (ref 11.5–15.5)
WBC: 9.5 10*3/uL (ref 4.0–10.5)
nRBC: 0 % (ref 0.0–0.2)

## 2022-11-12 LAB — COMPREHENSIVE METABOLIC PANEL
ALT: 18 U/L (ref 0–44)
AST: 20 U/L (ref 15–41)
Albumin: 3.2 g/dL — ABNORMAL LOW (ref 3.5–5.0)
Alkaline Phosphatase: 108 U/L (ref 38–126)
Anion gap: 7 (ref 5–15)
BUN: 47 mg/dL — ABNORMAL HIGH (ref 6–20)
CO2: 29 mmol/L (ref 22–32)
Calcium: 8.4 mg/dL — ABNORMAL LOW (ref 8.9–10.3)
Chloride: 96 mmol/L — ABNORMAL LOW (ref 98–111)
Creatinine, Ser: 1.76 mg/dL — ABNORMAL HIGH (ref 0.44–1.00)
GFR, Estimated: 33 mL/min — ABNORMAL LOW (ref >60)
Glucose, Bld: 150 mg/dL — ABNORMAL HIGH (ref 70–99)
Potassium: 4.2 mmol/L (ref 3.5–5.1)
Sodium: 132 mmol/L — ABNORMAL LOW (ref 135–145)
Total Bilirubin: 0.3 mg/dL (ref 0.3–1.2)
Total Protein: 6 g/dL — ABNORMAL LOW (ref 6.5–8.1)

## 2022-11-12 LAB — LIPASE, BLOOD: Lipase: 42 U/L (ref 11–51)

## 2022-11-12 MED ORDER — SODIUM CHLORIDE 0.9 % IV SOLN: INTRAVENOUS | Status: DC

## 2022-11-12 MED ORDER — SODIUM CHLORIDE 0.9 % IV BOLUS: 500.0000 mL | Freq: Once | INTRAVENOUS | Status: DC

## 2022-11-12 NOTE — ED Provider Notes (Addendum)
[place] [facility] EMERGENCY DEPARTMENT AT [facility] Provider Note   CSN: [id number] Arrival date & time: 11/12/22  1622     History  Chief Complaint  Patient presents with   Flank Pain    Carrie Clark is a 62 y.o. female.  Patient sent in from nursing facility for not acting herself and with complaints supposedly they said flank pain but patient kind of says his abdomen hurts all over.  Patient absolutely refuses a IV.  But is willing to do blood work which we got and also willing to do CT scan of the abdomen without blood work.  Patient's past medical history significant for COPD hypertension hypothyroidism CVA schizophrenia tardive dyskinesia and schizoaffective active disorder bipolar type.  Patient never used tobacco products.  Onset of symptoms were apparently yesterday.  Temp here 97.5 blood pressure 102/54 heart rate of 62 oxygen saturations on room air 94%.       Home Medications Prior to Admission medications   Medication Sig Start Date End Date Taking? Authorizing Provider  acetaminophen (TYLENOL) 325 MG tablet Take 2 tablets (650 mg total) by mouth every 6 (six) hours as needed for moderate pain. 01/29/19   Mesner, Barbara Cower, MD  albuterol (PROVENTIL HFA;VENTOLIN HFA) 108 (90 Base) MCG/ACT inhaler Inhale 2 puffs into the lungs every 6 (six) hours as needed for wheezing or shortness of breath. 06/07/15   Mesner, Barbara Cower, MD  atorvastatin (LIPITOR) 40 MG tablet Take 40 mg by mouth at bedtime.     [provider]  atorvastatin (LIPITOR) 40 MG tablet Take 40 mg by mouth daily.    [provider]  AUSTEDO 9 MG TABS Take 2 tablets by mouth 2 (two) times daily. 12/21/20   [provider]  benztropine (COGENTIN) 1 MG tablet Take 1 mg by mouth 2 (two) times daily.     [provider]  cephALEXin (KEFLEX) 500 MG capsule Take 1 capsule (500 mg total) by mouth 2 (two) times daily. 06/08/21   Particia Nearing, PA-C  cetirizine (ZYRTEC) 10 MG chewable  tablet Chew 10 mg by mouth daily.    [provider]  cetirizine (ZYRTEC) 10 MG tablet Take 1 tablet (10 mg total) by mouth daily. 11/11/14   Withrow, Everardo All, FNP  Deutetrabenazine (AUSTEDO) 12 MG TABS Take 12 mg by mouth in the morning and at bedtime.    [provider]  Deutetrabenazine (AUSTEDO) 9 MG TABS Take by mouth.    [provider]  diazepam (VALIUM) 10 MG tablet Take 10 mg by mouth every 6 (six) hours as needed for anxiety.    [provider]  diphenhydrAMINE (BENADRYL ALLERGY) 25 MG tablet Take 1 tablet (25 mg total) by mouth at bedtime. Patient not taking: Reported on 12/29/2020 11/17/19 12/17/19  Concha Se, MD  diphenoxylate-atropine (LOMOTIL) 2.5-0.025 MG tablet Take 1 tablet by mouth 4 (four) times daily as needed for diarrhea or loose stools.    [provider]  donepezil (ARICEPT) 10 MG tablet Take 10 mg by mouth at bedtime.    [provider]  donepezil (ARICEPT) 5 MG tablet Take 5 mg by mouth at bedtime.    [provider]  DULoxetine (CYMBALTA) 30 MG capsule Take 1 capsule (30 mg total) by mouth daily. 11/17/19 12/29/20  Concha Se, MD  DULoxetine (CYMBALTA) 30 MG capsule Take 30 mg by mouth daily.    [provider]  famotidine (PEPCID) 20 MG tablet Take 1 tablet (20 mg total)  by mouth 2 (two) times daily. 11/11/14   Withrow, Everardo All, FNP  famotidine (PEPCID) 20 MG tablet Take 20 mg by mouth 2 (two) times daily.    [provider]  furosemide (LASIX) 20 MG tablet Take 1 tablet (20 mg total) by mouth daily. 11/11/14   Withrow, Everardo All, FNP  Glucagon (GVOKE HYPOPEN 1-PACK) 1 MG/0.2ML SOAJ Inject 1 mg into the skin daily as needed.    [provider]  glycopyrrolate (ROBINUL) 1 MG tablet Take 1 mg by mouth 3 (three) times daily.    [provider]  haloperidol (HALDOL) 0.5 MG tablet Take 1 tablet (0.5 mg total) by mouth every morning. 11/17/19 12/29/20  Concha Se, MD  haloperidol  (HALDOL) 2 MG tablet Take 2 tablets (4 mg total) by mouth at bedtime. 11/17/19 12/29/20  Concha Se, MD  haloperidol decanoate (HALDOL DECANOATE) 100 MG/ML injection Inject 0.25 mLs (25 mg total) into the muscle every 28 (twenty-eight) days for 1 day. 12/17/19 12/29/20  Concha Se, MD  insulin aspart (NOVOLOG) 100 UNIT/ML injection Inject 1-10 Units into the skin 3 (three) times daily before meals. Sliding scale    [provider]  insulin detemir (LEVEMIR) 100 UNIT/ML injection Inject 20-25 Units into the skin See admin instructions. Inject 25 units at 8 am and 20 units 5 pm    [provider]  Insulin Detemir (LEVEMIR) 100 UNIT/ML Pen Inject 25 Units into the skin 2 (two) times daily. Inject 25 units at 8 AM and then inject 20 units at 5 PM    [provider]  ketoconazole (NIZORAL) 2 % cream Apply 1 application topically 2 (two) times daily as needed for irritation. 06/08/21   Particia Nearing, PA-C  levothyroxine (SYNTHROID) 100 MCG tablet Take 100 mcg by mouth daily before breakfast.     [provider]  levothyroxine (SYNTHROID) 100 MCG tablet Take 100 mcg by mouth daily before breakfast.    [provider]  lisinopril (PRINIVIL,ZESTRIL) 5 MG tablet Take 5 mg by mouth daily.    [provider]  lisinopril (ZESTRIL) 5 MG tablet Take 5 mg by mouth daily.    [provider]  LORazepam (ATIVAN) 1 MG tablet Take 1 mg by mouth 3 (three) times daily. 12/28/20   [provider]  Melatonin 3 MG SUBL Take 10 tablets by mouth at bedtime. 11/29/20   [provider]  metFORMIN (GLUCOPHAGE) 1000 MG tablet Take 1 tablet (1,000 mg total) by mouth 2 (two) times daily. 09/11/15   Charm Rings, NP  metFORMIN (GLUCOPHAGE) 500 MG tablet Take 500 mg by mouth 2 (two) times daily. 12/29/20   [provider]  metFORMIN (GLUCOPHAGE) 500 MG tablet Take 500 mg by mouth 2 (two) times daily with a meal.    [provider]   Multiple Vitamins-Minerals (MULTIVITAMIN WITH MINERALS) tablet Take 1 tablet by mouth daily. 11/11/14   Withrow, Everardo All, FNP  Oxcarbazepine (TRILEPTAL) 300 MG tablet Take 300 mg by mouth 2 (two) times daily. 12/28/20   [provider]  potassium chloride (K-DUR,KLOR-CON) 10 MEQ tablet Take 1 tablet (10 mEq total) by mouth daily. 11/11/14   Withrow, Everardo All, FNP  potassium chloride (KLOR-CON) 10 MEQ tablet Take 10 mEq by mouth daily. 12/28/20   [provider]  prazosin (MINIPRESS) 1 MG capsule Take 1 mg by mouth at bedtime. 12/28/20   [provider]  QUEtiapine (SEROQUEL) 100 MG tablet Take 150 mg by mouth  at bedtime. 12/27/20   [provider]  tiotropium (SPIRIVA) 18 MCG inhalation capsule Place 18 mcg into inhaler and inhale daily.    [provider]  traZODone (DESYREL) 50 MG tablet Take 50 mg by mouth at bedtime. 12/28/20   [provider]  trihexyphenidyl (ARTANE) 2 MG tablet Take 2 mg by mouth daily as needed. 12/27/20   [provider]  VRAYLAR 3 MG capsule Take 3 mg by mouth daily. 11/29/20   [provider]  zolpidem (AMBIEN) 5 MG tablet Take 5 mg by mouth at bedtime as needed. 12/28/20   [provider]  fluPHENAZine (PROLIXIN) 10 MG tablet Take 1 tablet (10 mg total) by mouth 3 (three) times daily. 12/03/18 11/17/19  Adrian Saran, MD  fluPHENAZine decanoate (PROLIXIN) 25 MG/ML injection Inject 50 mg into the muscle every 28 (twenty-eight) days.  11/17/19  [provider]  lurasidone (LATUDA) 20 MG TABS tablet Take 20 mg by mouth daily in the afternoon.   11/17/19  [provider]      Allergies    Fish allergy    Review of Systems   Review of Systems  Constitutional:  Negative for chills and fever.  HENT:  Negative for ear pain and sore throat.   Eyes:  Negative for pain and visual disturbance.  Respiratory:  Negative for cough and shortness of breath.   Cardiovascular:  Negative for chest pain and  palpitations.  Gastrointestinal:  Positive for abdominal pain. Negative for vomiting.  Genitourinary:  Negative for dysuria and hematuria.  Musculoskeletal:  Negative for arthralgias and back pain.  Skin:  Negative for color change and rash.  Neurological:  Negative for seizures and syncope.  All other systems reviewed and are negative.   Physical Exam Updated Vital Signs BP (!) 102/54   Pulse 62   Temp (!) 97.5 F (36.4 C) (Oral)   Resp 18   Ht 1.626 m (5\' 4" )   Wt 73.5 kg   SpO2 94%   BMI 27.81 kg/m  Physical Exam Vitals and nursing note reviewed.  Constitutional:      General: She is not in acute distress.    Appearance: Normal appearance. She is well-developed. She is not ill-appearing.  HENT:     Head: Normocephalic and atraumatic.  Eyes:     Extraocular Movements: Extraocular movements intact.     Conjunctiva/sclera: Conjunctivae normal.     Pupils: Pupils are equal, round, and reactive to light.  Cardiovascular:     Rate and Rhythm: Normal rate and regular rhythm.     Heart sounds: No murmur heard. Pulmonary:     Effort: Pulmonary effort is normal. No respiratory distress.     Breath sounds: Normal breath sounds.  Abdominal:     General: There is no distension.     Palpations: Abdomen is soft.     Tenderness: There is no abdominal tenderness. There is no guarding.  Musculoskeletal:        General: No swelling.     Cervical back: Normal range of motion and neck supple.     Right lower leg: No edema.     Left lower leg: No edema.  Skin:    General: Skin is warm and dry.     Capillary Refill: Capillary refill takes less than 2 seconds.  Neurological:     General: No focal deficit present.     Mental Status: She is alert. Mental status is at baseline.  Psychiatric:  Mood and Affect: Mood normal.     ED Results / Procedures / Treatments   Labs (all labs ordered are listed, but only abnormal results are displayed) Labs Reviewed  CBC WITH  DIFFERENTIAL/PLATELET - Abnormal; Notable for the following components:      Result Value   RBC 3.21 (*)    Hemoglobin 9.6 (*)    HCT 30.3 (*)    All other components within normal limits  COMPREHENSIVE METABOLIC PANEL - Abnormal; Notable for the following components:   Sodium 132 (*)    Chloride 96 (*)    Glucose, Bld 150 (*)    BUN 47 (*)    Creatinine, Ser 1.76 (*)    Calcium 8.4 (*)    Total Protein 6.0 (*)    Albumin 3.2 (*)    GFR, Estimated 33 (*)    All other components within normal limits  LIPASE, BLOOD  URINALYSIS, ROUTINE W REFLEX MICROSCOPIC    EKG None  Radiology CT ABDOMEN PELVIS WO CONTRAST  Result Date: 11/12/2022 CLINICAL DATA:  Acute abdominal pain, left flank pain EXAM: CT ABDOMEN AND PELVIS WITHOUT CONTRAST TECHNIQUE: Multidetector CT imaging of the abdomen and pelvis was performed following the standard protocol without IV contrast. RADIATION DOSE REDUCTION: This exam was performed according to the departmental dose-optimization program which includes automated exposure control, adjustment of the mA and/or kV according to patient size and/or use of iterative reconstruction technique. COMPARISON:  None Available. FINDINGS: Lower chest: No pleural or pericardial effusion. Heart size normal. Blood pool is hypodense compared to the interventricular septum suggesting anemia. Hepatobiliary: No focal liver abnormality is seen. No gallstones, gallbladder wall thickening, or biliary dilatation. Pancreas: Unremarkable. No pancreatic ductal dilatation or surrounding inflammatory changes. Spleen: Normal in size without focal abnormality. Adrenals/Urinary Tract: Left adrenal mass measuring 2 cm, consistent with lipid-rich benign adenoma. No follow-up imaging is recommended. No right adrenal mass. Symmetric bilateral renal contours. Small calcific foci in upper pole left kidney. No hydronephrosis or ureterectasis. Urinary bladder is distended. Stomach/Bowel: Stomach is distended by  gas and ingested material, without acute finding. Small-bowel relatively decompressed. Normal appendix. The colon is partially distended by gas and fecal material without acute finding. Mild fecal distention of the rectum. Vascular/Lymphatic: Scattered calcified aortic plaque without aneurysm. No abdominal or pelvic adenopathy. Reproductive: Uterus and bilateral adnexa are unremarkable. Other: No ascites.  No free air. Musculoskeletal: Advanced degenerative disc disease L1-2. No fracture or worrisome bone lesion. IMPRESSION: 1. No acute findings. 2. Left nephrolithiasis without hydronephrosis. 3.  Aortic Atherosclerosis (ICD10-I70.0). Electronically Signed   By: Corlis Leak M.D.   On: 11/12/2022 20:19   DG Chest Port 1 View  Result Date: 11/12/2022 CLINICAL DATA:  Altered mental status. EXAM: PORTABLE CHEST 1 VIEW COMPARISON:  Chest radiograph dated 07/05/2022. FINDINGS: No focal consolidation, pleural effusion, or pneumothorax. The cardiac silhouette is within normal limits. No acute osseous pathology. IMPRESSION: No active disease. Electronically Signed   By: Elgie Collard M.D.   On: 11/12/2022 17:50    Procedures Procedures    Medications Ordered in ED Medications  0.9 %  sodium chloride infusion ( Intravenous Patient Refused/Not Given 11/12/22 1727)  sodium chloride 0.9 % bolus 500 mL (500 mLs Intravenous Patient Refused/Not Given 11/12/22 1727)    ED Course/ Medical Decision Making/ A&P                             Medical Decision Making Amount and/or  Complexity of Data Reviewed Labs: ordered. Radiology: ordered.  Risk Prescription drug management.   Patient has agreed to CT without IV.  CBC no leukocytosis hemoglobin down a little bit 9.6 but not too far off from baseline platelets at 190.  Complete metabolic panel sodium 132 chloride at 96 glucose at 150 CO2 normal at 29 BUN 47 creatinine 1.76 this is actually an improvement in her renal function from previous labs.  GFR 33 LFTs  normal.  Anion gap normal at 7.  Lipase not elevated at 42 we are trying to get a urinalysis not sure if we will get that from her chest x-ray had no active disease.  Workup here without any acute findings.  CT abdomen without contrast without any acute findings.  Left nephrolithiasis without hydro nephritis is.  But nothing in the ureter system.  Patient will not provide a urine.  Not able to check that.  Patient stable for discharge back to facility.   Final Clinical Impression(s) / ED Diagnoses Final diagnoses:  Generalized abdominal pain    Rx / DC Orders ED Discharge Orders     None         Vanetta Mulders, MD 11/12/22 1811    Vanetta Mulders, MD 11/12/22 [id number]    Vanetta Mulders, MD 11/12/22 [id number]

## 2022-11-12 NOTE — Discharge Instructions (Signed)
Workup to include labs and CT of the abdomen without any acute findings.  Patient would not provide a urinalysis would not allow [place] to get it.  No acute findings on her workup here today.  Patient stable for return back to facility.  Return for any new or worse symptoms.

## 2022-11-12 NOTE — ED Triage Notes (Signed)
Pt c/o left flank pain since yesterday and staff states she is not acting like normal self.
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
## 2022-11-19 ENCOUNTER — Other Ambulatory Visit: Payer: Self-pay

## 2022-11-19 ENCOUNTER — Emergency Department (HOSPITAL_COMMUNITY): Payer: Medicare Other

## 2022-11-19 ENCOUNTER — Encounter (HOSPITAL_COMMUNITY): Payer: Self-pay

## 2022-11-19 ENCOUNTER — Emergency Department (HOSPITAL_COMMUNITY)
Admission: EM | Admit: 2022-11-19 | Discharge: 2022-11-19 | Disposition: A | Discharge status: Home / Self Care | Payer: Medicare Other | Attending: Emergency Medicine | Admitting: Emergency Medicine

## 2022-11-19 DIAGNOSIS — N3000 Acute cystitis without hematuria: Secondary | ICD-10-CM | POA: Insufficient documentation

## 2022-11-19 DIAGNOSIS — Z794 Long term (current) use of insulin: Secondary | ICD-10-CM | POA: Diagnosis not present

## 2022-11-19 DIAGNOSIS — Z7984 Long term (current) use of oral hypoglycemic drugs: Secondary | ICD-10-CM | POA: Insufficient documentation

## 2022-11-19 DIAGNOSIS — E860 Dehydration: Secondary | ICD-10-CM | POA: Insufficient documentation

## 2022-11-19 DIAGNOSIS — E11649 Type 2 diabetes mellitus with hypoglycemia without coma: Secondary | ICD-10-CM | POA: Insufficient documentation

## 2022-11-19 DIAGNOSIS — E162 Hypoglycemia, unspecified: Secondary | ICD-10-CM | POA: Diagnosis present

## 2022-11-19 DIAGNOSIS — Z79899 Other long term (current) drug therapy: Secondary | ICD-10-CM | POA: Diagnosis not present

## 2022-11-19 DIAGNOSIS — I10 Essential (primary) hypertension: Secondary | ICD-10-CM | POA: Insufficient documentation

## 2022-11-19 DIAGNOSIS — R55 Syncope and collapse: Secondary | ICD-10-CM | POA: Insufficient documentation

## 2022-11-19 LAB — URINALYSIS, W/ REFLEX TO CULTURE (INFECTION SUSPECTED)
Bilirubin Urine: NEGATIVE
Glucose, UA: NEGATIVE mg/dL
Hgb urine dipstick: NEGATIVE
Ketones, ur: NEGATIVE mg/dL
Nitrite: POSITIVE — AB
Protein, ur: NEGATIVE mg/dL
Specific Gravity, Urine: 1.012 (ref 1.005–1.030)
pH: 5 (ref 5.0–8.0)

## 2022-11-19 LAB — COMPREHENSIVE METABOLIC PANEL
ALT: 18 U/L (ref 0–44)
AST: 23 U/L (ref 15–41)
Albumin: 3.6 g/dL (ref 3.5–5.0)
Alkaline Phosphatase: 115 U/L (ref 38–126)
Anion gap: 10 (ref 5–15)
BUN: 39 mg/dL — ABNORMAL HIGH (ref 6–20)
CO2: 28 mmol/L (ref 22–32)
Calcium: 9 mg/dL (ref 8.9–10.3)
Chloride: 101 mmol/L (ref 98–111)
Creatinine, Ser: 1.44 mg/dL — ABNORMAL HIGH (ref 0.44–1.00)
GFR, Estimated: 42 mL/min — ABNORMAL LOW (ref >60)
Glucose, Bld: 63 mg/dL — ABNORMAL LOW (ref 70–99)
Potassium: 3.6 mmol/L (ref 3.5–5.1)
Sodium: 139 mmol/L (ref 135–145)
Total Bilirubin: 0.3 mg/dL (ref 0.3–1.2)
Total Protein: 6.7 g/dL (ref 6.5–8.1)

## 2022-11-19 LAB — CBC WITH DIFFERENTIAL/PLATELET
Abs Immature Granulocytes: 0.03 10*3/uL (ref 0.00–0.07)
Basophils Absolute: 0 10*3/uL (ref 0.0–0.1)
Basophils Relative: 0 %
Eosinophils Absolute: 0.3 10*3/uL (ref 0.0–0.5)
Eosinophils Relative: 2 %
HCT: 34.7 % — ABNORMAL LOW (ref 36.0–46.0)
Hemoglobin: 10.7 g/dL — ABNORMAL LOW (ref 12.0–15.0)
Immature Granulocytes: 0 %
Lymphocytes Relative: 15 %
Lymphs Abs: 1.8 10*3/uL (ref 0.7–4.0)
MCH: 29.6 pg (ref 26.0–34.0)
MCHC: 30.8 g/dL (ref 30.0–36.0)
MCV: 96.1 fL (ref 80.0–100.0)
Monocytes Absolute: 0.5 10*3/uL (ref 0.1–1.0)
Monocytes Relative: 5 %
Neutro Abs: 8.9 10*3/uL — ABNORMAL HIGH (ref 1.7–7.7)
Neutrophils Relative %: 78 %
Platelets: 201 10*3/uL (ref 150–400)
RBC: 3.61 MIL/uL — ABNORMAL LOW (ref 3.87–5.11)
RDW: 15 % (ref 11.5–15.5)
WBC: 11.5 10*3/uL — ABNORMAL HIGH (ref 4.0–10.5)
nRBC: 0 % (ref 0.0–0.2)

## 2022-11-19 LAB — LACTIC ACID, PLASMA
Lactic Acid, Venous: 1.5 mmol/L (ref 0.5–1.9)
Lactic Acid, Venous: 1.4 mmol/L (ref 0.5–1.9)

## 2022-11-19 LAB — PROTIME-INR
INR: 0.9 (ref 0.8–1.2)
Prothrombin Time: 11.8 seconds (ref 11.4–15.2)

## 2022-11-19 LAB — CBG MONITORING, ED
Glucose-Capillary: 78 mg/dL (ref 70–99)
Glucose-Capillary: 215 mg/dL — ABNORMAL HIGH (ref 70–99)

## 2022-11-19 MED ORDER — LACTATED RINGERS IV BOLUS
1000.0000 mL | Freq: Once | INTRAVENOUS | Status: AC
  Administered 2022-11-19: 1000 mL via INTRAVENOUS

## 2022-11-19 MED ORDER — SODIUM CHLORIDE 0.9 % IV SOLN
1.0000 g | Freq: Once | INTRAVENOUS | Status: AC
  Administered 2022-11-19: 1 g via INTRAVENOUS
  Filled 2022-11-19: qty 10

## 2022-11-19 MED ORDER — DEXTROSE 50 % IV SOLN
12.5000 g | Freq: Once | INTRAVENOUS | Status: AC
  Administered 2022-11-19: 12.5 g via INTRAVENOUS
  Filled 2022-11-19: qty 50

## 2022-11-19 MED ORDER — SODIUM CHLORIDE 0.9% FLUSH
3.0000 mL | Freq: Two times a day (BID) | INTRAVENOUS | Status: DC
  Administered 2022-11-19: 3 mL via INTRAVENOUS

## 2022-11-19 MED ORDER — CEFPODOXIME PROXETIL 200 MG PO TABS: 200.0000 mg | ORAL_TABLET | Freq: Two times a day (BID) | ORAL | 0 refills | Status: AC

## 2022-11-19 MED ORDER — SODIUM CHLORIDE 0.9 % IV SOLN: 250.0000 mL | INTRAVENOUS | Status: DC | PRN

## 2022-11-19 MED ORDER — SODIUM CHLORIDE 0.9% FLUSH: 3.0000 mL | INTRAVENOUS | Status: DC | PRN

## 2022-11-19 MED ORDER — DEXTROSE 50 % IV SOLN: 1.0000 | Freq: Once | INTRAVENOUS | Status: DC

## 2022-11-19 NOTE — ED Notes (Signed)
Patient transported to CT 

## 2022-11-19 NOTE — ED Notes (Signed)
RN notified of patients CBG. 

## 2022-11-19 NOTE — ED Triage Notes (Signed)
Pt bib [facility] from a group home. Stated staff went to get the pt up for the day and she was in the floor unconscious with a blood sugar of 48. Group home stated LKW was at 9pm last night. EMS gave pt 1 mg of glucagon in her right arm, sugar at this time is 78.

## 2022-11-19 NOTE — Discharge Instructions (Addendum)
This patient has been seen in the ER today. She was found to have a low blood sugar.  Please ensure patient is eating well at the living facility.  Please test her blood glucose before giving her basal insulin (aspart).   This patient has a UTI (urinary tract infection).  You must give her the antibiotic, cefpodoxime, twice a day for the next 14 days.  She must take the whole course of her antibiotic even if she starts feeling better.  Antibiotics may cause her to have diarrhea.  We are still waiting on the culture results from her urine, you will be contacted if her antibiotic needs to be changed.  She must return to the ER if she develops increased confusion, has any more falls, continues to have episodes of hypoglycemia, develops fever, chills.

## 2022-11-19 NOTE — ED Notes (Signed)
Spoke with [facility], gave them all discharge information. They will be on their way to pick her up in just a little bit.
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
## 2022-11-19 NOTE — ED Notes (Signed)
Nurse went into room with PA for assessment and pt had pulled out her 2nd IV. Site cleaned and assessed, Abt stopped.
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
## 2022-11-19 NOTE — ED Provider Notes (Signed)
[facility] EMERGENCY DEPARTMENT AT [facility] Provider Note   CSN: [id number] Arrival date & time: 11/19/22  8295     History  Chief Complaint  Patient presents with   Hypoglycemia   Loss of Consciousness   History is gathered per EMS report.  Carrie Clark is a 62 y.o. female with history of hypertension, diabetes on metformin, insulin aspart, insulin detemir, presents to emergency room via EMS.  She was at her home when is found down on the floor this morning by staff.  It is unknown how long she was down for.  Last known well was 9 PM last night per EMS report.  She is brought in for concern of hypoglycemia, blood sugar this morning was reported at 48.   Hypoglycemia Loss of Consciousness      Home Medications Prior to Admission medications   Medication Sig Start Date End Date Taking? Authorizing Provider  cefpodoxime (VANTIN) 200 MG tablet Take 1 tablet (200 mg total) by mouth 2 (two) times daily for 14 days. 11/19/22 12/03/22 Yes Arabella Merles, PA-C  acetaminophen (TYLENOL) 325 MG tablet Take 2 tablets (650 mg total) by mouth every 6 (six) hours as needed for moderate pain. 01/29/19   Mesner, Barbara Cower, MD  albuterol (PROVENTIL HFA;VENTOLIN HFA) 108 (90 Base) MCG/ACT inhaler Inhale 2 puffs into the lungs every 6 (six) hours as needed for wheezing or shortness of breath. 06/07/15   Mesner, Barbara Cower, MD  atorvastatin (LIPITOR) 40 MG tablet Take 40 mg by mouth at bedtime.     [provider]  atorvastatin (LIPITOR) 40 MG tablet Take 40 mg by mouth daily.    [provider]  AUSTEDO 9 MG TABS Take 2 tablets by mouth 2 (two) times daily. 12/21/20   [provider]  cetirizine (ZYRTEC) 10 MG chewable tablet Chew 10 mg by mouth daily.    [provider]  cetirizine (ZYRTEC) 10 MG tablet Take 1 tablet (10 mg total) by mouth daily. 11/11/14   Withrow, Everardo All, FNP  Deutetrabenazine (AUSTEDO) 12 MG TABS Take 12 mg by mouth in the morning and at  bedtime.    [provider]  Deutetrabenazine (AUSTEDO) 9 MG TABS Take by mouth.    [provider]  diazepam (VALIUM) 10 MG tablet Take 10 mg by mouth every 6 (six) hours as needed for anxiety.    [provider]  diphenhydrAMINE (BENADRYL ALLERGY) 25 MG tablet Take 1 tablet (25 mg total) by mouth at bedtime. Patient not taking: Reported on 12/29/2020 11/17/19 12/17/19  Concha Se, MD  diphenoxylate-atropine (LOMOTIL) 2.5-0.025 MG tablet Take 1 tablet by mouth 4 (four) times daily as needed for diarrhea or loose stools.    [provider]  donepezil (ARICEPT) 10 MG tablet Take 10 mg by mouth at bedtime.    [provider]  donepezil (ARICEPT) 5 MG tablet Take 5 mg by mouth at bedtime.    [provider]  DULoxetine (CYMBALTA) 30 MG capsule Take 1 capsule (30 mg total) by mouth daily. 11/17/19 12/29/20  Concha Se, MD  DULoxetine (CYMBALTA) 30 MG capsule Take 30 mg by mouth daily.    [provider]  famotidine (PEPCID) 20 MG tablet Take 1 tablet (20 mg total) by mouth 2 (two) times daily. 11/11/14   Withrow, Everardo All, FNP  famotidine (PEPCID) 20 MG tablet Take 20 mg by mouth 2 (two) times daily.    [provider]  furosemide (LASIX) 20 MG tablet Take  1 tablet (20 mg total) by mouth daily. 11/11/14   Withrow, Everardo All, FNP  Glucagon (GVOKE HYPOPEN 1-PACK) 1 MG/0.2ML SOAJ Inject 1 mg into the skin daily as needed.    [provider]  glycopyrrolate (ROBINUL) 1 MG tablet Take 1 mg by mouth 3 (three) times daily.    [provider]  haloperidol (HALDOL) 0.5 MG tablet Take 1 tablet (0.5 mg total) by mouth every morning. 11/17/19 12/29/20  Concha Se, MD  haloperidol (HALDOL) 2 MG tablet Take 2 tablets (4 mg total) by mouth at bedtime. 11/17/19 12/29/20  Concha Se, MD  haloperidol decanoate (HALDOL DECANOATE) 100 MG/ML injection Inject 0.25 mLs (25 mg total) into the muscle every 28 (twenty-eight) days for 1 day. 12/17/19  12/29/20  Concha Se, MD  insulin aspart (NOVOLOG) 100 UNIT/ML injection Inject 1-10 Units into the skin 3 (three) times daily before meals. Sliding scale    [provider]  insulin detemir (LEVEMIR) 100 UNIT/ML injection Inject 20-25 Units into the skin See admin instructions. Inject 25 units at 8 am and 20 units 5 pm    [provider]  Insulin Detemir (LEVEMIR) 100 UNIT/ML Pen Inject 25 Units into the skin 2 (two) times daily. Inject 25 units at 8 AM and then inject 20 units at 5 PM    [provider]  ketoconazole (NIZORAL) 2 % cream Apply 1 application topically 2 (two) times daily as needed for irritation. 06/08/21   Particia Nearing, PA-C  levothyroxine (SYNTHROID) 100 MCG tablet Take 100 mcg by mouth daily before breakfast.     [provider]  levothyroxine (SYNTHROID) 100 MCG tablet Take 100 mcg by mouth daily before breakfast.    [provider]  lisinopril (PRINIVIL,ZESTRIL) 5 MG tablet Take 5 mg by mouth daily.    [provider]  lisinopril (ZESTRIL) 5 MG tablet Take 5 mg by mouth daily.    [provider]  LORazepam (ATIVAN) 1 MG tablet Take 1 mg by mouth 3 (three) times daily. 12/28/20   [provider]  Melatonin 3 MG SUBL Take 10 tablets by mouth at bedtime. 11/29/20   [provider]  metFORMIN (GLUCOPHAGE) 1000 MG tablet Take 1 tablet (1,000 mg total) by mouth 2 (two) times daily. 09/11/15   Charm Rings, NP  metFORMIN (GLUCOPHAGE) 500 MG tablet Take 500 mg by mouth 2 (two) times daily. 12/29/20   [provider]  metFORMIN (GLUCOPHAGE) 500 MG tablet Take 500 mg by mouth 2 (two) times daily with a meal.    [provider]  Multiple Vitamins-Minerals (MULTIVITAMIN WITH MINERALS) tablet Take 1 tablet by mouth daily. 11/11/14   Withrow, Everardo All, FNP  Oxcarbazepine (TRILEPTAL) 300 MG tablet Take 300 mg by mouth 2 (two) times daily. 12/28/20   [provider]  potassium chloride  (K-DUR,KLOR-CON) 10 MEQ tablet Take 1 tablet (10 mEq total) by mouth daily. 11/11/14   Withrow, Everardo All, FNP  potassium chloride (KLOR-CON) 10 MEQ tablet Take 10 mEq by mouth daily. 12/28/20   [provider]  prazosin (MINIPRESS) 1 MG capsule Take 1 mg by mouth at bedtime. 12/28/20   [provider]  QUEtiapine (SEROQUEL) 100 MG tablet Take 150 mg by mouth at bedtime. 12/27/20   [provider]  tiotropium (SPIRIVA) 18 MCG inhalation capsule Place 18 mcg into inhaler and inhale daily.    [provider]  traZODone (DESYREL) 50 MG tablet Take 50 mg by mouth at  bedtime. 12/28/20   [provider]  trihexyphenidyl (ARTANE) 2 MG tablet Take 2 mg by mouth daily as needed. 12/27/20   [provider]  VRAYLAR 3 MG capsule Take 3 mg by mouth daily. 11/29/20   [provider]  zolpidem (AMBIEN) 5 MG tablet Take 5 mg by mouth at bedtime as needed. 12/28/20   [provider]  fluPHENAZine (PROLIXIN) 10 MG tablet Take 1 tablet (10 mg total) by mouth 3 (three) times daily. 12/03/18 11/17/19  Adrian Saran, MD  fluPHENAZine decanoate (PROLIXIN) 25 MG/ML injection Inject 50 mg into the muscle every 28 (twenty-eight) days.  11/17/19  [provider]  lurasidone (LATUDA) 20 MG TABS tablet Take 20 mg by mouth daily in the afternoon.   11/17/19  [provider]      Allergies    Fish allergy    Review of Systems   Review of Systems  Cardiovascular:  Positive for syncope.    Physical Exam Updated Vital Signs BP 111/63   Pulse 86   Temp (!) 94.3 F (34.6 C) (Oral)   Resp (!) 32   Ht 5\' 4"  (1.626 m)   Wt 73.5 kg   SpO2 96%   BMI 27.81 kg/m  Physical Exam Eyes:     Comments: Pinpoint pupils     ED Results / Procedures / Treatments   Labs (all labs ordered are listed, but only abnormal results are displayed) Labs Reviewed  CBC WITH DIFFERENTIAL/PLATELET - Abnormal; Notable for the following components:      Result Value   WBC  11.5 (*)    RBC 3.61 (*)    Hemoglobin 10.7 (*)    HCT 34.7 (*)    Neutro Abs 8.9 (*)    All other components within normal limits  COMPREHENSIVE METABOLIC PANEL - Abnormal; Notable for the following components:   Glucose, Bld 63 (*)    BUN 39 (*)    Creatinine, Ser 1.44 (*)    GFR, Estimated 42 (*)    All other components within normal limits  URINALYSIS, W/ REFLEX TO CULTURE (INFECTION SUSPECTED) - Abnormal; Notable for the following components:   APPearance HAZY (*)    Nitrite POSITIVE (*)    Leukocytes,Ua MODERATE (*)    Bacteria, UA FEW (*)    All other components within normal limits  CBG MONITORING, ED - Abnormal; Notable for the following components:   Glucose-Capillary 215 (*)    All other components within normal limits  CULTURE, BLOOD (ROUTINE X 2)  CULTURE, BLOOD (ROUTINE X 2)  CULTURE, BLOOD (ROUTINE X 2)  URINE CULTURE  LACTIC ACID, PLASMA  LACTIC ACID, PLASMA  PROTIME-INR  CBG MONITORING, ED    EKG EKG Interpretation  Date/Time:  Tuesday November 19 2022 07:49:52 EDT Ventricular Rate:  64 PR Interval:  151 QRS Duration: 93 QT Interval:  463 QTC Calculation: 459 R Axis:   11 Text Interpretation: Sinus arrhythmia Abnormal R-wave progression, early transition Baseline wander in lead(s) I II aVR Premature atrial complexes Confirmed by Eber Hong ([id number]) on 11/19/2022 8:43:45 AM  Radiology CT Cervical Spine Wo Contrast  Result Date: 11/19/2022 CLINICAL DATA:  Provided history: Neck trauma, dangerous injury mechanism. Fall. EXAM: CT CERVICAL SPINE WITHOUT CONTRAST TECHNIQUE: Multidetector CT imaging of the cervical spine was performed without intravenous contrast. Multiplanar CT image reconstructions were also generated. RADIATION DOSE REDUCTION: This exam was performed according to the departmental dose-optimization program which includes automated exposure control, adjustment of the mA and/or kV according  to patient size and/or use of iterative reconstruction  technique. COMPARISON:  Cervical spine CT 10/22/2020. FINDINGS: Alignment: Dextrocurvature of the cervical spine. Reversal of the expected cervical lordosis. 2 mm C3-C4 grade 1 anterolisthesis. Slight grade 1 anterolisthesis at C4-C5. 2 mm grade 1 retrolisthesis at C5-C6. 2 mm grade 1 retrolisthesis at C7-T1. Skull base and vertebrae: The basion-dental and atlanto-dental intervals are maintained.No evidence of acute fracture to the cervical spine. Congenital nonunion of the posterior arch of C1. Soft tissues and spinal canal: No prevertebral fluid or swelling. No visible canal hematoma. Disc levels: Cervical spondylosis with multilevel disc space narrowing, disc bulges, endplate spurring, uncovertebral hypertrophy and facet arthrosis. Disc space narrowing is greatest at C7-T1 (severe at this level). Multilevel spinal canal stenosis. Most notably, at least moderate spinal canal stenosis is suspected at C3-C4 and C5-C6. Multilevel bony neural foraminal narrowing. Prominent ventral osteophytes at C4-C5, C5-C6, C6-C7 and C7-T1. Ventral osteophytes at C4-C5 and C6-C7 are bridging. Upper chest: No consolidation within the imaged lung apices. No visible pneumothorax. IMPRESSION: 1. No evidence of an acute cervical spine fracture. 2. Nonspecific reversal of the expected cervical lordosis. 3. Dextrocurvature of the cervical spine. 4. Grade 1 spondylolisthesis at C3-C4, C4-C5, C5-C6 and C7-T1 unchanged the prior cervical spine CT of 10/22/2020. 5. Cervical spondylosis as described. Multilevel spinal canal stenosis. Most notably, at least moderate spinal canal stenosis is suspected at C3-C4 and C5-C6. Multilevel bony neural foraminal narrowing. Electronically Signed   By: Jackey Loge D.O.   On: 11/19/2022 09:03   CT Head Wo Contrast  Result Date: 11/19/2022 CLINICAL DATA:  Trauma, fall, altered mental status EXAM: CT HEAD WITHOUT CONTRAST TECHNIQUE: Contiguous axial images were obtained from the base of the skull through  the vertex without intravenous contrast. RADIATION DOSE REDUCTION: This exam was performed according to the departmental dose-optimization program which includes automated exposure control, adjustment of the mA and/or kV according to patient size and/or use of iterative reconstruction technique. COMPARISON:  10/22/2020 FINDINGS: Brain: No acute intracranial findings are seen. There are no signs of bleeding within the cranium. Ventricles are not dilated. Cortical sulci are prominent. There is no focal edema or mass effect. Vascular: Unremarkable. Skull: No acute findings are seen. Sinuses/Orbits: There is mucosal thickening in ethmoid and maxillary sinuses. There are no air-fluid levels. Other: None. IMPRESSION: No acute intracranial findings are seen in noncontrast CT brain. Chronic sinusitis. Electronically Signed   By: Ernie Avena M.D.   On: 11/19/2022 08:54   DG Chest 2 View  Result Date: 11/19/2022 CLINICAL DATA:  Possible sepsis EXAM: CHEST - 2 VIEW COMPARISON:  Previous studies including the examination of 11/12/2022 FINDINGS: Transverse diameter of heart is increased. Low lung volumes. There are no signs of pulmonary edema or focal pulmonary consolidation. There is no pleural effusion or pneumothorax. IMPRESSION: Cardiomegaly. Low lung volumes. There are no signs of pulmonary edema or focal pulmonary consolidation. Electronically Signed   By: Ernie Avena M.D.   On: 11/19/2022 08:42    Procedures Procedures    Medications Ordered in ED Medications  dextrose 50 % solution 50 mL (50 mLs Intravenous Not Given 11/19/22 0851)  sodium chloride flush (NS) 0.9 % injection 3 mL (3 mLs Intravenous Given 11/19/22 0905)  sodium chloride flush (NS) 0.9 % injection 3 mL (has no administration in time range)  0.9 %  sodium chloride infusion (has no administration in time range)  dextrose 50 % solution 12.5 g (12.5 g Intravenous Given 11/19/22 0854)  lactated ringers  bolus 1,000 mL (0 mLs  Intravenous Stopped 11/19/22 1050)  cefTRIAXone (ROCEPHIN) 1 g in sodium chloride 0.9 % 100 mL IVPB (0 g Intravenous Stopped 11/19/22 1126)    ED Course/ Medical Decision Making/ A&P                             Medical Decision Making Amount and/or Complexity of Data Reviewed Labs: ordered. Radiology: ordered. ECG/medicine tests: ordered.  Risk Prescription drug management.   62 y.o. female with pertinent past medical history of hypertension, diabetes on metformin and insulin aspart and detemir , schizophrenia presents to the ED for concern of hypoglycemia and being found down at her group home  Differential diagnosis includes but is not limited to hypoglycemia secondary to poor intake vs taking too much insulin vs infection, sepsis, brain bleed, UTI  ED Course:  Upon arrival patient alert and comfortable appearing.  Found to be mildly hypertensive at 111/63, rectal temperature of 94.3. Bair hugger applied. Due to hypothermia will obtain sepsis orders.   EMS reported glucose this morning was 48 now brought up to 78 on D50.   Another order of D50 was placed to further bring her blood sugars back up.  Patient also eating.  Due to being found on the floor, unknown baseline mentation status, patient endorsing head pain, will obtain CT head.  Patient also given fluids due to being hypotensive and having dry mucous membranes. 11:22 AM Upon re-evaluation, patient is alert and pulling out IV lines.  Vital signs stable she has received her full course of ceftriaxone and fluids here before pulling out her IV.  Glucose improved from 78 now at 215.   Impression: Urinary tract infection Hypoglycemia Dehydration  Disposition:  The patient was discharged to her living facility with instructions to take 14-day course of cefpodoxime.  Directions were provided for the patient/facility stating that she needs to take the full 14-day course.  They also need to check her blood glucose before giving her  basal insulin.  Return precautions given   Lab Tests: I Ordered, and personally interpreted labs.  The pertinent results include:   Original CBG upon arrival 39, now 215 Urinalysis with nitrites and leukocytes, 21-50 WBCs/hpf.  Culture pending CBC with leukocytosis of 11.5 CMP with elevated Cr and BUN, at patient baseline Lactic acid, PT-INR within normal limits  Imaging Studies ordered: I ordered imaging studies including CT head, CT cervical spine, chest x-ray I independently visualized the imaging with scope of interpretation limited to determining acute life threatening conditions related to emergency care.  CT showed no acute cervical spine or skull fractures, or brain bleed.  No focal consolidations or pleural effusion I agree with the radiologist interpretation   Cardiac Monitoring: / EKG: The patient was maintained on a cardiac monitor.  I personally viewed and interpreted the cardiac monitored which showed an underlying rhythm of: Normal sinus rhythm   Consultations Obtained: Not indicated   Co morbidities that complicate the patient evaluation  Diabetes, hypertension,  schizophrenia  Social Determinants of Health:  Lives at a group home              Final Clinical Impression(s) / ED Diagnoses Final diagnoses:  Hypoglycemia  Dehydration  Acute cystitis without hematuria    Rx / DC Orders ED Discharge Orders          Ordered    cefpodoxime (VANTIN) 200 MG tablet  2 times daily  11/19/22 1114              Arabella Merles, PA-C 11/19/22 1129    Eber Hong, MD 11/29/22 [phone]

## 2022-11-21 LAB — URINE CULTURE: Culture: >=100000 — AB

## 2022-11-22 ENCOUNTER — Telehealth (HOSPITAL_BASED_OUTPATIENT_CLINIC_OR_DEPARTMENT_OTHER): Payer: Self-pay | Admitting: Emergency Medicine

## 2022-11-22 NOTE — Telephone Encounter (Signed)
Post ED Visit - Positive Culture Follow-up  Culture report reviewed by antimicrobial stewardship pharmacist: [facility] []  Enzo Bi, Pharm.D. []  Celedonio Miyamoto, Pharm.D., BCPS AQ-ID []  Garvin Fila, Pharm.D., BCPS []  Georgina Pillion, Pharm.D., BCPS []  [place], [address].D., BCPS, AAHIVP []  Estella Husk, Pharm.D., BCPS, AAHIVP []  Lysle Pearl, PharmD, BCPS []  Phillips Climes, PharmD, BCPS []  Agapito Games, PharmD, BCPS [x]  Calton Dach, PharmD []  Mervyn Gay, PharmD, BCPS []  Vinnie Level, PharmD  [facility] []  Len Childs, PharmD []  Greer Pickerel, PharmD []  Adalberto Cole, PharmD []  Perlie Gold, Rph []  Lonell Face) Jean Rosenthal, PharmD []  Earl Many, PharmD []  Junita Push, PharmD []  Dorna Leitz, PharmD []  Terrilee Files, PharmD []  Lynann Beaver, PharmD []  Keturah Barre, PharmD []  Loralee Pacas, PharmD []  Bernadene Person, PharmD   Positive urine culture Treated with Cefpodoxime, organism sensitive to the same and no further patient follow-up is required at this time. Glyn Ade MD  Pamala Hurry 11/22/2022, 5:39 PM

## 2022-11-24 LAB — CULTURE, BLOOD (ROUTINE X 2)
Culture: NO GROWTH
Culture: NO GROWTH

## 2022-12-09 ENCOUNTER — Ambulatory Visit: Payer: Medicare Other | Admitting: Podiatry

## 2023-01-17 ENCOUNTER — Ambulatory Visit: Payer: Medicare Other | Admitting: Podiatry

## 2023-01-30 ENCOUNTER — Ambulatory Visit: Payer: Medicare Other | Admitting: Podiatry

## 2023-02-19 ENCOUNTER — Encounter: Payer: Self-pay | Admitting: Podiatry

## 2023-02-19 ENCOUNTER — Ambulatory Visit (INDEPENDENT_AMBULATORY_CARE_PROVIDER_SITE_OTHER): Payer: Medicare Other | Admitting: Podiatry

## 2023-02-19 DIAGNOSIS — B351 Tinea unguium: Secondary | ICD-10-CM

## 2023-02-19 DIAGNOSIS — E119 Type 2 diabetes mellitus without complications: Secondary | ICD-10-CM

## 2023-02-19 DIAGNOSIS — M79675 Pain in left toe(s): Secondary | ICD-10-CM

## 2023-02-19 DIAGNOSIS — M79674 Pain in right toe(s): Secondary | ICD-10-CM | POA: Diagnosis not present

## 2023-02-19 DIAGNOSIS — Z794 Long term (current) use of insulin: Secondary | ICD-10-CM

## 2023-02-19 NOTE — Progress Notes (Signed)
This patient returns to my office for at risk foot care.  This patient requires this care by a professional since this patient will be at risk due to having diabetes.  This patient is unable to cut nails himself since the patient cannot reach his nails.These nails are painful walking and wearing shoes.  This patient presents for at risk foot care today.  General Appearance  Alert, conversant and in no acute stress.  Vascular  Dorsalis pedis and posterior tibial  pulses are palpable  bilaterally.  Capillary return is within normal limits  bilaterally. Temperature is within normal limits  bilaterally.  Neurologic  Senn-Weinstein monofilament wire test within normal limits  bilaterally. Muscle power within normal limits bilaterally.  Nails Thick disfigured discolored nails with subungual debris  Hallux nails bilaterally. No evidence of bacterial infection or drainage bilaterally.  Orthopedic  No limitations of motion  feet .  No crepitus or effusions noted.  No bony pathology or digital deformities noted.  Skin  normotropic skin with no porokeratosis noted bilaterally.  No signs of infections or ulcers noted.     Onychomycosis  Pain in right toes  Pain in left toes  Consent was obtained for treatment procedures.   Mechanical debridement of nails 1-5  bilaterally performed with a nail nipper.  Filed with dremel without incident.    Return office visit    3  months                  Told patient to return for periodic foot care and evaluation due to potential at risk complications.   Helane Gunther DPM

## 2023-06-03 ENCOUNTER — Ambulatory Visit (INDEPENDENT_AMBULATORY_CARE_PROVIDER_SITE_OTHER): Payer: Medicare Other | Admitting: Podiatry

## 2023-06-03 ENCOUNTER — Encounter: Payer: Self-pay | Admitting: Podiatry

## 2023-06-03 DIAGNOSIS — E119 Type 2 diabetes mellitus without complications: Secondary | ICD-10-CM

## 2023-06-03 DIAGNOSIS — Z794 Long term (current) use of insulin: Secondary | ICD-10-CM

## 2023-06-03 DIAGNOSIS — M79674 Pain in right toe(s): Secondary | ICD-10-CM

## 2023-06-03 DIAGNOSIS — M79675 Pain in left toe(s): Secondary | ICD-10-CM

## 2023-06-03 DIAGNOSIS — B351 Tinea unguium: Secondary | ICD-10-CM | POA: Diagnosis not present

## 2023-06-03 NOTE — Progress Notes (Signed)
This patient returns to my office for at risk foot care.  This patient requires this care by a professional since this patient will be at risk due to having diabetes.  This patient is unable to cut nails himself since the patient cannot reach his nails.These nails are painful walking and wearing shoes.  This patient presents for at risk foot care today.  General Appearance  Alert, conversant and in no acute stress.  Vascular  Dorsalis pedis and posterior tibial  pulses are palpable  bilaterally.  Capillary return is within normal limits  bilaterally. Temperature is within normal limits  bilaterally.  Neurologic  Senn-Weinstein monofilament wire test within normal limits  bilaterally. Muscle power within normal limits bilaterally.  Nails Thick disfigured discolored nails with subungual debris  Hallux nails bilaterally. No evidence of bacterial infection or drainage bilaterally.  Orthopedic  No limitations of motion  feet .  No crepitus or effusions noted.  No bony pathology or digital deformities noted.  Skin  normotropic skin with no porokeratosis noted bilaterally.  No signs of infections or ulcers noted.     Onychomycosis  Pain in right toes  Pain in left toes  Consent was obtained for treatment procedures.   Mechanical debridement of nails 1-5  bilaterally performed with a nail nipper.  Filed with dremel without incident.    Return office visit    3  months                  Told patient to return for periodic foot care and evaluation due to potential at risk complications.   Helane Gunther DPM

## 2023-07-23 ENCOUNTER — Other Ambulatory Visit: Payer: Self-pay

## 2023-07-23 ENCOUNTER — Encounter (HOSPITAL_COMMUNITY): Payer: Self-pay | Admitting: *Deleted

## 2023-07-23 ENCOUNTER — Emergency Department (HOSPITAL_COMMUNITY): Payer: Medicare Other

## 2023-07-23 ENCOUNTER — Emergency Department (HOSPITAL_COMMUNITY)
Admission: EM | Admit: 2023-07-23 | Discharge: 2023-07-23 | Disposition: A | Discharge status: Home / Self Care | Payer: Medicare Other | Attending: Student | Admitting: Student

## 2023-07-23 DIAGNOSIS — Z794 Long term (current) use of insulin: Secondary | ICD-10-CM | POA: Diagnosis not present

## 2023-07-23 DIAGNOSIS — J101 Influenza due to other identified influenza virus with other respiratory manifestations: Secondary | ICD-10-CM | POA: Diagnosis not present

## 2023-07-23 DIAGNOSIS — I10 Essential (primary) hypertension: Secondary | ICD-10-CM | POA: Diagnosis not present

## 2023-07-23 DIAGNOSIS — R058 Other specified cough: Secondary | ICD-10-CM

## 2023-07-23 DIAGNOSIS — Z7984 Long term (current) use of oral hypoglycemic drugs: Secondary | ICD-10-CM | POA: Diagnosis not present

## 2023-07-23 DIAGNOSIS — E039 Hypothyroidism, unspecified: Secondary | ICD-10-CM | POA: Insufficient documentation

## 2023-07-23 DIAGNOSIS — E119 Type 2 diabetes mellitus without complications: Secondary | ICD-10-CM | POA: Diagnosis not present

## 2023-07-23 DIAGNOSIS — J449 Chronic obstructive pulmonary disease, unspecified: Secondary | ICD-10-CM | POA: Insufficient documentation

## 2023-07-23 DIAGNOSIS — F17200 Nicotine dependence, unspecified, uncomplicated: Secondary | ICD-10-CM | POA: Insufficient documentation

## 2023-07-23 DIAGNOSIS — R053 Chronic cough: Secondary | ICD-10-CM | POA: Diagnosis present

## 2023-07-23 LAB — RESP PANEL BY RT-PCR (RSV, FLU A&B, COVID)  RVPGX2
Influenza A by PCR: POSITIVE — AB
Influenza B by PCR: NEGATIVE
Resp Syncytial Virus by PCR: NEGATIVE
SARS Coronavirus 2 by RT PCR: NEGATIVE

## 2023-07-23 MED ORDER — BENZONATATE 100 MG PO CAPS: 100.0000 mg | ORAL_CAPSULE | Freq: Three times a day (TID) | ORAL | 0 refills | Status: DC

## 2023-07-23 MED ORDER — IPRATROPIUM-ALBUTEROL 0.5-2.5 (3) MG/3ML IN SOLN
3.0000 mL | Freq: Once | RESPIRATORY_TRACT | Status: AC
  Administered 2023-07-23: 3 mL via RESPIRATORY_TRACT
  Filled 2023-07-23: qty 3

## 2023-07-23 MED ORDER — DOXYCYCLINE HYCLATE 100 MG PO CAPS: 100.0000 mg | ORAL_CAPSULE | Freq: Two times a day (BID) | ORAL | 0 refills | Status: AC

## 2023-07-23 NOTE — Discharge Instructions (Addendum)
 You are seen in the ER today for cough and congestion.  You have the flu but your symptoms today going on for over a week so there is no indication for Tamiflu.  Prescribing cough medication.  I have also prescribed an inhaler if you do not already have one to use.  Do not smoke.  Come back to the ER if you have new or worsening symptoms.  Since you are having productive cough that has been lasting for so long we are also treating you with antibiotics for possible pneumonia, though your chest x-ray was reassuring.

## 2023-07-23 NOTE — ED Provider Notes (Signed)
 [place] EMERGENCY DEPARTMENT AT [facility] Provider Note   CSN: [id number] Arrival date & time: 07/23/23  1050     History  Chief Complaint  Patient presents with   Cough    Carrie Clark is a 63 y.o. female.  PMH of schizoaffective disorder bipolar type, hypertension, hypothyroidism, COPD, diabetes.  She is a smoker.  Reports has been coughing for a month.  She resides at [facility]. She reports cough has been productive of thick white sputum that looks like potatoes.  She denies chest pain, denies shortness of breath.   Cough      Home Medications Prior to Admission medications   Medication Sig Start Date End Date Taking? Authorizing Provider  benzonatate (TESSALON) 100 MG capsule Take 1 capsule (100 mg total) by mouth every 8 (eight) hours. 07/23/23  Yes Tradarius Reinwald A, PA-C  doxycycline (VIBRAMYCIN) 100 MG capsule Take 1 capsule (100 mg total) by mouth 2 (two) times daily for 7 days. 07/23/23 07/30/23 Yes Mann Skaggs A, PA-C  acetaminophen (TYLENOL) 325 MG tablet Take 2 tablets (650 mg total) by mouth every 6 (six) hours as needed for moderate pain. 01/29/19   Mesner, Barbara Cower, MD  albuterol (PROVENTIL HFA;VENTOLIN HFA) 108 (90 Base) MCG/ACT inhaler Inhale 2 puffs into the lungs every 6 (six) hours as needed for wheezing or shortness of breath. 06/07/15   Mesner, Barbara Cower, MD  atorvastatin (LIPITOR) 40 MG tablet Take 40 mg by mouth at bedtime.     [provider]  atorvastatin (LIPITOR) 40 MG tablet Take 40 mg by mouth daily.    [provider]  AUSTEDO 9 MG TABS Take 2 tablets by mouth 2 (two) times daily. 12/21/20   [provider]  cetirizine (ZYRTEC) 10 MG chewable tablet Chew 10 mg by mouth daily.    [provider]  cetirizine (ZYRTEC) 10 MG tablet Take 1 tablet (10 mg total) by mouth daily. 11/11/14   Withrow, Everardo All, FNP  Deutetrabenazine (AUSTEDO) 12 MG TABS Take 12 mg by mouth in the morning and at bedtime.    [provider]  Deutetrabenazine (AUSTEDO) 9 MG TABS Take by mouth.    [provider]  diazepam (VALIUM) 10 MG tablet Take 10 mg by mouth every 6 (six) hours as needed for anxiety.    [provider]  diphenhydrAMINE (BENADRYL ALLERGY) 25 MG tablet Take 1 tablet (25 mg total) by mouth at bedtime. Patient not taking: Reported on 12/29/2020 11/17/19 12/17/19  Concha Se, MD  diphenoxylate-atropine (LOMOTIL) 2.5-0.025 MG tablet Take 1 tablet by mouth 4 (four) times daily as needed for diarrhea or loose stools.    [provider]  donepezil (ARICEPT) 10 MG tablet Take 10 mg by mouth at bedtime.    [provider]  donepezil (ARICEPT) 5 MG tablet Take 5 mg by mouth at bedtime.    [provider]  DULoxetine (CYMBALTA) 30 MG capsule Take 1 capsule (30 mg total) by mouth daily. 11/17/19 12/29/20  Concha Se, MD  DULoxetine (CYMBALTA) 30 MG capsule Take 30 mg by mouth daily.    [provider]  famotidine (PEPCID) 20 MG tablet Take 1 tablet (20 mg total) by mouth 2 (two) times daily. 11/11/14   Withrow, Everardo All, FNP  famotidine (PEPCID) 20 MG tablet Take 20 mg by mouth 2 (two) times daily.    [provider]  furosemide (LASIX) 20 MG tablet Take 1 tablet (20 mg total) by mouth  daily. 11/11/14   Withrow, Everardo All, FNP  Glucagon (GVOKE HYPOPEN 1-PACK) 1 MG/0.2ML SOAJ Inject 1 mg into the skin daily as needed.    [provider]  glycopyrrolate (ROBINUL) 1 MG tablet Take 1 mg by mouth 3 (three) times daily.    [provider]  haloperidol (HALDOL) 0.5 MG tablet Take 1 tablet (0.5 mg total) by mouth every morning. 11/17/19 12/29/20  Concha Se, MD  haloperidol (HALDOL) 2 MG tablet Take 2 tablets (4 mg total) by mouth at bedtime. 11/17/19 12/29/20  Concha Se, MD  haloperidol decanoate (HALDOL DECANOATE) 100 MG/ML injection Inject 0.25 mLs (25 mg total) into the muscle every 28 (twenty-eight) days for 1 day. 12/17/19 12/29/20  Concha Se, MD  insulin aspart (NOVOLOG) 100 UNIT/ML injection Inject 1-10 Units into the skin 3 (three) times daily before meals. Sliding scale    [provider]  insulin detemir (LEVEMIR) 100 UNIT/ML injection Inject 20-25 Units into the skin See admin instructions. Inject 25 units at 8 am and 20 units 5 pm    [provider]  Insulin Detemir (LEVEMIR) 100 UNIT/ML Pen Inject 25 Units into the skin 2 (two) times daily. Inject 25 units at 8 AM and then inject 20 units at 5 PM    [provider]  ketoconazole (NIZORAL) 2 % cream Apply 1 application topically 2 (two) times daily as needed for irritation. 06/08/21   Particia Nearing, PA-C  levothyroxine (SYNTHROID) 100 MCG tablet Take 100 mcg by mouth daily before breakfast.     [provider]  levothyroxine (SYNTHROID) 100 MCG tablet Take 100 mcg by mouth daily before breakfast.    [provider]  lisinopril (PRINIVIL,ZESTRIL) 5 MG tablet Take 5 mg by mouth daily.    [provider]  lisinopril (ZESTRIL) 5 MG tablet Take 5 mg by mouth daily.    [provider]  LORazepam (ATIVAN) 1 MG tablet Take 1 mg by mouth 3 (three) times daily. 12/28/20   [provider]  Melatonin 3 MG SUBL Take 10 tablets by mouth at bedtime. 11/29/20   [provider]  metFORMIN (GLUCOPHAGE) 1000 MG tablet Take 1 tablet (1,000 mg total) by mouth 2 (two) times daily. 09/11/15   Charm Rings, NP  metFORMIN (GLUCOPHAGE) 500 MG tablet Take 500 mg by mouth 2 (two) times daily. 12/29/20   [provider]  metFORMIN (GLUCOPHAGE) 500 MG tablet Take 500 mg by mouth 2 (two) times daily with a meal.    [provider]  Multiple Vitamins-Minerals (MULTIVITAMIN WITH MINERALS) tablet Take 1 tablet by mouth daily. 11/11/14   Withrow, Everardo All, FNP  Oxcarbazepine (TRILEPTAL) 300 MG tablet Take 300 mg by mouth 2 (two) times daily. 12/28/20   [provider]  potassium chloride (K-DUR,KLOR-CON) 10  MEQ tablet Take 1 tablet (10 mEq total) by mouth daily. 11/11/14   Withrow, Everardo All, FNP  potassium chloride (KLOR-CON) 10 MEQ tablet Take 10 mEq by mouth daily. 12/28/20   [provider]  prazosin (MINIPRESS) 1 MG capsule Take 1 mg by mouth at bedtime. 12/28/20   [provider]  QUEtiapine (SEROQUEL) 100 MG tablet Take 150 mg by mouth at bedtime. 12/27/20   [provider]  tiotropium (SPIRIVA) 18 MCG inhalation capsule Place 18 mcg into inhaler and inhale daily.    [provider]  traZODone (DESYREL) 50 MG tablet Take 50 mg by mouth at bedtime. 12/28/20   [provider]  trihexyphenidyl (ARTANE) 2 MG tablet Take 2 mg by mouth daily as needed. 12/27/20   [provider]  VRAYLAR 3 MG capsule Take 3 mg by mouth daily. 11/29/20   [provider]  zolpidem (AMBIEN) 5 MG tablet Take 5 mg by mouth at bedtime as needed. 12/28/20   [provider]  fluPHENAZine (PROLIXIN) 10 MG tablet Take 1 tablet (10 mg total) by mouth 3 (three) times daily. 12/03/18 11/17/19  Adrian Saran, MD  fluPHENAZine decanoate (PROLIXIN) 25 MG/ML injection Inject 50 mg into the muscle every 28 (twenty-eight) days.  11/17/19  [provider]  lurasidone (LATUDA) 20 MG TABS tablet Take 20 mg by mouth daily in the afternoon.   11/17/19  [provider]      Allergies    Fish allergy    Review of Systems   Review of Systems  Respiratory:  Positive for cough.     Physical Exam Updated Vital Signs BP (!) 115/50 (BP Location: Left Arm)   Pulse 82   Temp 98.3 F (36.8 C) (Oral)   Resp 17   SpO2 92%  Physical Exam Vitals and nursing note reviewed.  Constitutional:      General: She is not in acute distress.    Appearance: She is well-developed.  HENT:     Head: Normocephalic and atraumatic.     Mouth/Throat:     Mouth: Mucous membranes are moist.  Eyes:     Conjunctiva/sclera: Conjunctivae normal.  Cardiovascular:     Rate and Rhythm: Normal  rate and regular rhythm.     Heart sounds: No murmur heard. Pulmonary:     Effort: Pulmonary effort is normal. No respiratory distress.     Breath sounds: Normal breath sounds.     Comments: Mild scattered wheezing, Rales left lower quadrant Abdominal:     Palpations: Abdomen is soft.     Tenderness: There is no abdominal tenderness.  Musculoskeletal:        General: No swelling.     Cervical back: Neck supple.  Skin:    General: Skin is warm and dry.     Capillary Refill: Capillary refill takes less than 2 seconds.  Neurological:     General: No focal deficit present.     Mental Status: She is alert and oriented to person, place, and time.  Psychiatric:        Mood and Affect: Mood normal.     ED Results / Procedures / Treatments   Labs (all labs ordered are listed, but only abnormal results are displayed) Labs Reviewed  RESP PANEL BY RT-PCR (RSV, FLU A&B, COVID)  [id number] - Abnormal; Notable for the following components:      Result Value   Influenza A by PCR POSITIVE (*)    All other components within normal limits  CBG MONITORING, ED    EKG None  Radiology DG Chest 2 View Result Date: 07/23/2023 CLINICAL DATA:  Cough. EXAM: CHEST - 2 VIEW COMPARISON:  Chest radiograph dated 11/19/2022. FINDINGS: Low lung volumes with bronchovascular crowding. The cardiac silhouette is enlarged. No focal consolidation, pleural effusion, or pneumothorax. 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 No acute osseous abnormality. IMPRESSION: Low lung volumes.  No acute cardiopulmonary findings. Electronically Signed   By: Hart Robinsons M.D.   On: 07/23/2023 12:48    Procedures Procedures    Medications Ordered in ED Medications  ipratropium-albuterol (DUONEB) 0.5-2.5 (3) MG/3ML nebulizer solution 3 mL (3 mLs Nebulization Given 07/23/23 1150)  ED Course/ Medical Decision Making/ A&P                                 Medical Decision Making Differential diagnosis  includes but not limited to influenza, COVID-19, bronchitis, pneumonia, sinusitis, other  ED course: Patient with history of schizoaffective disorder sent in from her care home for evaluation of ongoing cough, staff at the facility stated she has become more irritable with a cough.  I spoke with her legal guardian, chance.  She had not heard about any recent illness but does note the patient does have a "quick temper".  Patient alert and oriented, having some diffuse wheezing that resolved with nebulizer treatment, has some very mild left basilar Rales.  Chest x-ray showed low lung volumes but no acute findings.  She is positive for flu A, she is afebrile, saturations at bedside initially 92% on room air, I rechecked on my repeat evaluation and pulse ox with good waveform shows O2 saturation 95%.  Will send patient home with albuterol inhaler, Tessalon Perles for the cough and antibiotic for cold pneumonia.  Patient reports she has had pneumonia in the past and this feels similar.  Cough has been going on for at least over a week so no indication for Tamiflu.  I did consider steroids given patient's history of COPD and wheezing but this improves newly after 1 nebulizer treatment.  I feel the risk of steroid causing worsening psychosis to her mental health issues and also causing hyperglycemia as she is diabetic I do not feel at this time that the benefits outweigh the risks.  I discussed this with her legal guardian as well who is agreeable.  Advised on close follow-up and strict return precautions.  Amount and/or Complexity of Data Reviewed Radiology: ordered.  Risk Prescription drug management.          Final Clinical Impression(s) / ED Diagnoses Final diagnoses:  Influenza A  Productive cough    Rx / DC Orders ED Discharge Orders          Ordered    benzonatate (TESSALON) 100 MG capsule  Every 8 hours        07/23/23 1436    doxycycline (VIBRAMYCIN) 100 MG capsule  2 times daily         07/23/23 [address] 07/23/23 1501    Glendora Score, MD 07/24/23 1219

## 2023-07-23 NOTE — ED Triage Notes (Signed)
 Pt brought from [facility] care for c/o cough x one week; pt is schizophrenic and the staff reports she has been getting more irritated due to the cough

## 2023-07-23 NOTE — ED Notes (Signed)
 Tried to ambulate pt with portable pulse ox. She hit at me, knocked the pulse ox away and said "NO"!

## 2023-09-02 ENCOUNTER — Ambulatory Visit: Payer: Medicare Other | Admitting: Podiatry

## 2023-10-09 ENCOUNTER — Other Ambulatory Visit: Payer: Self-pay

## 2023-10-09 ENCOUNTER — Encounter (HOSPITAL_COMMUNITY): Payer: Self-pay | Admitting: Emergency Medicine

## 2023-10-09 ENCOUNTER — Emergency Department (HOSPITAL_COMMUNITY)
Admission: EM | Admit: 2023-10-09 | Discharge: 2023-10-09 | Disposition: A | Discharge status: Home / Self Care | Attending: Emergency Medicine | Admitting: Emergency Medicine

## 2023-10-09 ENCOUNTER — Emergency Department (HOSPITAL_COMMUNITY)

## 2023-10-09 DIAGNOSIS — Z794 Long term (current) use of insulin: Secondary | ICD-10-CM | POA: Insufficient documentation

## 2023-10-09 DIAGNOSIS — R739 Hyperglycemia, unspecified: Secondary | ICD-10-CM | POA: Insufficient documentation

## 2023-10-09 DIAGNOSIS — E875 Hyperkalemia: Secondary | ICD-10-CM | POA: Diagnosis not present

## 2023-10-09 DIAGNOSIS — Z8673 Personal history of transient ischemic attack (TIA), and cerebral infarction without residual deficits: Secondary | ICD-10-CM | POA: Diagnosis not present

## 2023-10-09 DIAGNOSIS — R0602 Shortness of breath: Secondary | ICD-10-CM | POA: Diagnosis present

## 2023-10-09 DIAGNOSIS — J209 Acute bronchitis, unspecified: Secondary | ICD-10-CM | POA: Diagnosis not present

## 2023-10-09 DIAGNOSIS — J449 Chronic obstructive pulmonary disease, unspecified: Secondary | ICD-10-CM | POA: Insufficient documentation

## 2023-10-09 LAB — BRAIN NATRIURETIC PEPTIDE: B Natriuretic Peptide: 20 pg/mL (ref 0.0–100.0)

## 2023-10-09 LAB — COMPREHENSIVE METABOLIC PANEL WITH GFR
ALT: 14 U/L (ref 0–44)
AST: 17 U/L (ref 15–41)
Albumin: 3.3 g/dL — ABNORMAL LOW (ref 3.5–5.0)
Alkaline Phosphatase: 116 U/L (ref 38–126)
Anion gap: 10 (ref 5–15)
BUN: 31 mg/dL — ABNORMAL HIGH (ref 8–23)
CO2: 26 mmol/L (ref 22–32)
Calcium: 8.8 mg/dL — ABNORMAL LOW (ref 8.9–10.3)
Chloride: 95 mmol/L — ABNORMAL LOW (ref 98–111)
Creatinine, Ser: 1.41 mg/dL — ABNORMAL HIGH (ref 0.44–1.00)
GFR, Estimated: 42 mL/min — ABNORMAL LOW (ref >60)
Glucose, Bld: 328 mg/dL — ABNORMAL HIGH (ref 70–99)
Potassium: 4.3 mmol/L (ref 3.5–5.1)
Sodium: 131 mmol/L — ABNORMAL LOW (ref 135–145)
Total Bilirubin: 0.7 mg/dL (ref 0.0–1.2)
Total Protein: 6.5 g/dL (ref 6.5–8.1)

## 2023-10-09 LAB — CBC WITH DIFFERENTIAL/PLATELET
Abs Immature Granulocytes: 0.02 10*3/uL (ref 0.00–0.07)
Basophils Absolute: 0 10*3/uL (ref 0.0–0.1)
Basophils Relative: 0 %
Eosinophils Absolute: 0.2 10*3/uL (ref 0.0–0.5)
Eosinophils Relative: 3 %
HCT: 33.3 % — ABNORMAL LOW (ref 36.0–46.0)
Hemoglobin: 10.2 g/dL — ABNORMAL LOW (ref 12.0–15.0)
Immature Granulocytes: 0 %
Lymphocytes Relative: 17 %
Lymphs Abs: 1.2 10*3/uL (ref 0.7–4.0)
MCH: 29.4 pg (ref 26.0–34.0)
MCHC: 30.6 g/dL (ref 30.0–36.0)
MCV: 96 fL (ref 80.0–100.0)
Monocytes Absolute: 0.9 10*3/uL (ref 0.1–1.0)
Monocytes Relative: 13 %
Neutro Abs: 4.7 10*3/uL (ref 1.7–7.7)
Neutrophils Relative %: 67 %
Platelets: 188 10*3/uL (ref 150–400)
RBC: 3.47 MIL/uL — ABNORMAL LOW (ref 3.87–5.11)
RDW: 13.9 % (ref 11.5–15.5)
WBC: 7.1 10*3/uL (ref 4.0–10.5)
nRBC: 0 % (ref 0.0–0.2)

## 2023-10-09 LAB — I-STAT CHEM 8, ED
BUN: 43 mg/dL — ABNORMAL HIGH (ref 8–23)
Calcium, Ion: 1.04 mmol/L — ABNORMAL LOW (ref 1.15–1.40)
Chloride: 96 mmol/L — ABNORMAL LOW (ref 98–111)
Creatinine, Ser: 1.4 mg/dL — ABNORMAL HIGH (ref 0.44–1.00)
Glucose, Bld: 334 mg/dL — ABNORMAL HIGH (ref 70–99)
HCT: 32 % — ABNORMAL LOW (ref 36.0–46.0)
Hemoglobin: 10.9 g/dL — ABNORMAL LOW (ref 12.0–15.0)
Potassium: 5.3 mmol/L — ABNORMAL HIGH (ref 3.5–5.1)
Sodium: 132 mmol/L — ABNORMAL LOW (ref 135–145)
TCO2: 29 mmol/L (ref 22–32)

## 2023-10-09 LAB — RESP PANEL BY RT-PCR (RSV, FLU A&B, COVID)  RVPGX2
Influenza A by PCR: NEGATIVE
Influenza B by PCR: NEGATIVE
Resp Syncytial Virus by PCR: NEGATIVE
SARS Coronavirus 2 by RT PCR: NEGATIVE

## 2023-10-09 LAB — TROPONIN I (HIGH SENSITIVITY): Troponin I (High Sensitivity): 9 ng/L (ref <18)

## 2023-10-09 MED ORDER — DOXYCYCLINE HYCLATE 100 MG PO CAPS: 100.0000 mg | ORAL_CAPSULE | Freq: Two times a day (BID) | ORAL | 0 refills | Status: DC

## 2023-10-09 MED ORDER — IPRATROPIUM-ALBUTEROL 0.5-2.5 (3) MG/3ML IN SOLN
3.0000 mL | Freq: Once | RESPIRATORY_TRACT | Status: AC
  Administered 2023-10-09: 3 mL via RESPIRATORY_TRACT
  Filled 2023-10-09: qty 3

## 2023-10-09 MED ORDER — METHYLPREDNISOLONE SODIUM SUCC 125 MG IJ SOLR
125.0000 mg | Freq: Once | INTRAMUSCULAR | Status: AC
  Administered 2023-10-09: 125 mg via INTRAVENOUS
  Filled 2023-10-09: qty 2

## 2023-10-09 NOTE — ED Notes (Signed)
 [address] [place], [place] [address] Address for [facility]

## 2023-10-09 NOTE — ED Notes (Signed)
Transport arrived pt discharged.

## 2023-10-09 NOTE — ED Notes (Signed)
 Pt hollaring. Pulled off monitor/bp and sat.

## 2023-10-09 NOTE — ED Notes (Signed)
 Have called group home again multiple times, both numbers and unable to leave message.

## 2023-10-09 NOTE — ED Notes (Addendum)
 Pt does not reside at [place]. She resides at [facility]. Reached them and and they stated will be about 20 min. Pt in w/c in room. Talking but cooperative.  Would not let us  get vs for d/c

## 2023-10-09 NOTE — ED Notes (Signed)
 iStat BG 334; MD notified

## 2023-10-09 NOTE — ED Notes (Signed)
 Called group home multiple times with no answer and unable to leave message.Carrie Clark DSS called and left message.

## 2023-10-09 NOTE — Discharge Instructions (Signed)
 Your potassium is mildly elevated today at 5.3.  You need to stop taking your potassium supplementation and stop taking your lisinopril  until cleared by your primary care physician.  Call them today to set up an urgent repeat blood test to measure your potassium.  Otherwise, we are putting you on antibiotics for your respiratory infection.  Follow-up with your primary care physician.  If you develop new or worsening trouble breathing, coughing up blood, fever, chest pain, or any other new/concerning symptoms then return to the ER.

## 2023-10-09 NOTE — ED Notes (Signed)
 Pt DSS LG and Sister called to ask for update. They were told that [facility] hand was contacted earlier about the pt's discharge but has not came to pick her up yet. Facility initially called around 2:50pm and gave eta of . Facility called again around 4:20pm and gave eta of 30 again. Currently trying to contact DSS legal guardian regarding transport

## 2023-10-09 NOTE — ED Notes (Signed)
 See triage notes.pt has taken oxygen off but sats 96% at this time ra

## 2023-10-09 NOTE — ED Provider Notes (Signed)
 [place] [facility] AT [facility] Provider Note   CSN: [id number] Arrival date & time: 10/09/23  0901     History  Chief Complaint  Patient presents with   Shortness of Breath    Carrie Clark is a 63 y.o. female.  HPI 63 year old female presents with shortness of breath and cough.  Patient endorses a cough with some yellow sputum for a couple days.  She has a history of schizophrenia, COPD, stroke.  She denies any chest pain but has felt short of breath though that is improved after being put on oxygen.  Her sats were in the high 80s and she was put on 2 L.  She had a headache earlier but that is gone.  She thinks her legs have been a little swollen.  Home Medications Prior to Admission medications   Medication Sig Start Date End Date Taking? Authorizing Provider  doxycycline  (VIBRAMYCIN ) 100 MG capsule Take 1 capsule (100 mg total) by mouth 2 (two) times daily. One po bid x 7 days 10/09/23  Yes Jerilynn Montenegro, MD  acetaminophen  (TYLENOL ) 325 MG tablet Take 2 tablets (650 mg total) by mouth every 6 (six) hours as needed for moderate pain. 01/29/19   Mesner, Reymundo Caulk, MD  albuterol  (PROVENTIL  HFA;VENTOLIN  HFA) 108 (90 Base) MCG/ACT inhaler Inhale 2 puffs into the lungs every 6 (six) hours as needed for wheezing or shortness of breath. 06/07/15   Mesner, Reymundo Caulk, MD  atorvastatin  (LIPITOR) 40 MG tablet Take 40 mg by mouth at bedtime.     [provider]  atorvastatin  (LIPITOR) 40 MG tablet Take 40 mg by mouth daily.    [provider]  AUSTEDO  9 MG TABS Take 2 tablets by mouth 2 (two) times daily. 12/21/20   [provider]  benzonatate  (TESSALON ) 100 MG capsule Take 1 capsule (100 mg total) by mouth every 8 (eight) hours. 07/23/23   Baxter Limber A, PA-C  cetirizine  (ZYRTEC ) 10 MG chewable tablet Chew 10 mg by mouth daily.    [provider]  cetirizine  (ZYRTEC ) 10 MG tablet Take 1 tablet (10 mg total) by mouth daily. 11/11/14   Withrow,  Theadora Fines, FNP  Deutetrabenazine  (AUSTEDO ) 12 MG TABS Take 12 mg by mouth in the morning and at bedtime.    [provider]  Deutetrabenazine  (AUSTEDO ) 9 MG TABS Take by mouth.    [provider]  diazepam  (VALIUM ) 10 MG tablet Take 10 mg by mouth every 6 (six) hours as needed for anxiety.    [provider]  diphenhydrAMINE  (BENADRYL  ALLERGY) 25 MG tablet Take 1 tablet (25 mg total) by mouth at bedtime. Patient not taking: Reported on 12/29/2020 11/17/19 12/17/19  Lubertha Rush, MD  diphenoxylate -atropine  (LOMOTIL ) 2.5-0.025 MG tablet Take 1 tablet by mouth 4 (four) times daily as needed for diarrhea or loose stools.    [provider]  donepezil  (ARICEPT ) 10 MG tablet Take 10 mg by mouth at bedtime.    [provider]  donepezil  (ARICEPT ) 5 MG tablet Take 5 mg by mouth at bedtime.    [provider]  DULoxetine  (CYMBALTA ) 30 MG capsule Take 1 capsule (30 mg total) by mouth daily. 11/17/19 12/29/20  Lubertha Rush, MD  DULoxetine  (CYMBALTA ) 30 MG capsule Take 30 mg by mouth daily.    [provider]  famotidine  (PEPCID ) 20 MG tablet Take 1 tablet (20 mg total) by mouth 2 (two) times daily. 11/11/14   Withrow, John C, FNP  famotidine  (PEPCID )  20 MG tablet Take 20 mg by mouth 2 (two) times daily.    [provider]  furosemide  (LASIX ) 20 MG tablet Take 1 tablet (20 mg total) by mouth daily. 11/11/14   Withrow, John C, FNP  Glucagon (GVOKE HYPOPEN 1-PACK) 1 MG/0.2ML SOAJ Inject 1 mg into the skin daily as needed.    [provider]  glycopyrrolate (ROBINUL) 1 MG tablet Take 1 mg by mouth 3 (three) times daily.    [provider]  haloperidol  (HALDOL ) 0.5 MG tablet Take 1 tablet (0.5 mg total) by mouth every morning. 11/17/19 12/29/20  Lubertha Rush, MD  haloperidol  (HALDOL ) 2 MG tablet Take 2 tablets (4 mg total) by mouth at bedtime. 11/17/19 12/29/20  Lubertha Rush, MD  haloperidol  decanoate (HALDOL  DECANOATE) 100 MG/ML injection  Inject 0.25 mLs (25 mg total) into the muscle every 28 (twenty-eight) days for 1 day. 12/17/19 12/29/20  Lubertha Rush, MD  insulin  aspart (NOVOLOG ) 100 UNIT/ML injection Inject 1-10 Units into the skin 3 (three) times daily before meals. Sliding scale    [provider]  insulin  detemir (LEVEMIR ) 100 UNIT/ML injection Inject 20-25 Units into the skin See admin instructions. Inject 25 units at 8 am and 20 units 5 pm    [provider]  Insulin  Detemir (LEVEMIR ) 100 UNIT/ML Pen Inject 25 Units into the skin 2 (two) times daily. Inject 25 units at 8 AM and then inject 20 units at 5 PM    [provider]  ketoconazole  (NIZORAL ) 2 % cream Apply 1 application topically 2 (two) times daily as needed for irritation. 06/08/21   Corbin Dess, PA-C  levothyroxine  (SYNTHROID ) 100 MCG tablet Take 100 mcg by mouth daily before breakfast.     [provider]  levothyroxine  (SYNTHROID ) 100 MCG tablet Take 100 mcg by mouth daily before breakfast.    [provider]  lisinopril  (PRINIVIL ,ZESTRIL ) 5 MG tablet Take 5 mg by mouth daily.    [provider]  LORazepam  (ATIVAN ) 1 MG tablet Take 1 mg by mouth 3 (three) times daily. 12/28/20   [provider]  Melatonin 3 MG SUBL Take 10 tablets by mouth at bedtime. 11/29/20   [provider]  metFORMIN  (GLUCOPHAGE ) 1000 MG tablet Take 1 tablet (1,000 mg total) by mouth 2 (two) times daily. 09/11/15   Lissa Riding, NP  metFORMIN  (GLUCOPHAGE ) 500 MG tablet Take 500 mg by mouth 2 (two) times daily. 12/29/20   [provider]  metFORMIN  (GLUCOPHAGE ) 500 MG tablet Take 500 mg by mouth 2 (two) times daily with a meal.    [provider]  Multiple Vitamins-Minerals (MULTIVITAMIN WITH MINERALS) tablet Take 1 tablet by mouth daily. 11/11/14   Withrow, Theadora Fines, FNP  Oxcarbazepine  (TRILEPTAL ) 300 MG tablet Take 300 mg by mouth 2 (two) times daily. 12/28/20   [provider]  potassium  chloride (KLOR-CON ) 10 MEQ tablet Take 10 mEq by mouth daily. 12/28/20   [provider]  prazosin  (MINIPRESS ) 1 MG capsule Take 1 mg by mouth at bedtime. 12/28/20   [provider]  QUEtiapine  (SEROQUEL ) 100 MG tablet Take 150 mg by mouth at bedtime. 12/27/20   [provider]  tiotropium (SPIRIVA ) 18 MCG inhalation capsule Place 18 mcg into inhaler and inhale daily.    [provider]  traZODone  (DESYREL ) 50 MG tablet Take 50 mg by mouth at bedtime. 12/28/20   [provider]  trihexyphenidyl (ARTANE) 2 MG tablet Take 2 mg  by mouth daily as needed. 12/27/20   [provider]  VRAYLAR  3 MG capsule Take 3 mg by mouth daily. 11/29/20   [provider]  zolpidem  (AMBIEN ) 5 MG tablet Take 5 mg by mouth at bedtime as needed. 12/28/20   [provider]  fluPHENAZine  (PROLIXIN ) 10 MG tablet Take 1 tablet (10 mg total) by mouth 3 (three) times daily. 12/03/18 11/17/19  Mody, Sital, MD  fluPHENAZine  decanoate (PROLIXIN ) 25 MG/ML injection Inject 50 mg into the muscle every 28 (twenty-eight) days.  11/17/19  [provider]  lurasidone  (LATUDA ) 20 MG TABS tablet Take 20 mg by mouth daily in the afternoon.   11/17/19  [provider]      Allergies    Fish allergy    Review of Systems   Review of Systems  Constitutional:  Negative for fever.  Respiratory:  Positive for cough and shortness of breath.   Cardiovascular:  Positive for leg swelling. Negative for chest pain.  Neurological:  Positive for headaches.    Physical Exam Updated Vital Signs BP (!) 123/56   Pulse 88   Temp 99 F (37.2 C) (Oral)   Resp (!) 22   SpO2 97%  Physical Exam Vitals and nursing note reviewed.  Constitutional:      General: She is not in acute distress.    Appearance: She is well-developed. She is not ill-appearing or diaphoretic.  HENT:     Head: Normocephalic and atraumatic.  Cardiovascular:     Rate and Rhythm: Normal rate and regular  rhythm.     Heart sounds: Normal heart sounds.  Pulmonary:     Effort: Pulmonary effort is normal. No accessory muscle usage or respiratory distress.     Breath sounds: Wheezing present.  Abdominal:     Palpations: Abdomen is soft.     Tenderness: There is no abdominal tenderness.  Musculoskeletal:     Comments: Mild lower leg edema bilaterally.  Skin:    General: Skin is warm and dry.  Neurological:     Mental Status: She is alert.     ED Results / Procedures / Treatments   Labs (all labs ordered are listed, but only abnormal results are displayed) Labs Reviewed  COMPREHENSIVE METABOLIC PANEL WITH GFR - Abnormal; Notable for the following components:      Result Value   Glucose, Bld 328 (*)    BUN 31 (*)    Creatinine, Ser 1.41 (*)    Albumin 3.3 (*)    GFR, Estimated 42 (*)    All other components within normal limits  CBC WITH DIFFERENTIAL/PLATELET - Abnormal; Notable for the following components:   RBC 3.47 (*)    Hemoglobin 10.2 (*)    HCT 33.3 (*)    All other components within normal limits  I-STAT CHEM 8, ED - Abnormal; Notable for the following components:   Sodium 132 (*)    Potassium 5.3 (*)    Chloride 96 (*)    BUN 43 (*)    Creatinine, Ser 1.40 (*)    Glucose, Bld 334 (*)    Calcium , Ion 1.04 (*)    Hemoglobin 10.9 (*)    HCT 32.0 (*)    All other components within normal limits  RESP PANEL BY RT-PCR (RSV, FLU A&B, COVID)  [id number]  BRAIN NATRIURETIC PEPTIDE  TROPONIN I (HIGH SENSITIVITY)    EKG EKG Interpretation Date/Time:  Thursday Oct 09 2023 10:01:44 EDT Ventricular Rate:  89 PR Interval:  145 QRS  Duration:  92 QT Interval:  380 QTC Calculation: 463 R Axis:   23  Text Interpretation: Sinus rhythm no acute ST/T changes Confirmed by Jerilynn Montenegro [phone]) on 10/09/2023 10:06:59 AM  Radiology DG Chest Port 1 View Result Date: 10/09/2023 CLINICAL DATA:  Shortness of breath EXAM: PORTABLE CHEST 1 VIEW COMPARISON:  February 26 25 FINDINGS:  Bilateral reticular interstitial infiltrates without consolidations could correlate with congestive changes Heart and mediastinum normal 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   On: 10/09/2023 10:10    Procedures Procedures    Medications Ordered in ED Medications  ipratropium-albuterol  (DUONEB) 0.5-2.5 (3) MG/3ML nebulizer solution 3 mL (3 mLs Nebulization Given 10/09/23 1025)  methylPREDNISolone sodium succinate (SOLU-MEDROL) 125 mg/2 mL injection 125 mg (125 mg Intravenous Given 10/09/23 1017)    ED Course/ Medical Decision Making/ A&P                                 Medical Decision Making Amount and/or Complexity of Data Reviewed Labs: ordered.    Details: Hyperglycemia with mild hyperkalemia at 5.3.  Normal WBC. Radiology: ordered and independent interpretation performed.    Details: No lobar pneumonia ECG/medicine tests: ordered and independent interpretation performed.    Details: No ischemia.  Risk Prescription drug management.   Patient has a little bit of wheezing here.  I suspect a little bit of bronchitis.  However her symptoms went away with albuterol /ipratropium neb.  She is feeling a lot better.  At 1 point she started become agitated and wanting to leave and was frustrated about not having any food and got into arguments with staff.  I suspect this is more related to her psychiatric disease.  However she was able to be calm down and was allowed to eat.  Her O2 sats are now normal on room air.  They were only borderline low and I suspect this is related to the acute bronchitis.  She continues to rest comfortably.  I discussed her case with her sister and emergency contact, Vermell Goes.  I think is reasonable to discharge her home with a course of antibiotics and continue her albuterol .  There have been lab issues with the CMP machine causing extended delays.  Her  i-STAT Chem-8 showed a potassium of 5.3 but her EKG shows no ECG changes.  She is on reportedly both lisinopril  and potassium so we will have her stop them both and have her follow-up with her PCP.  Otherwise, I think she is stable for discharge as she is requesting.  She is not hypoxic.  Will give return precautions.  Vitals are reassuring at this time.        Final Clinical Impression(s) / ED Diagnoses Final diagnoses:  Acute bronchitis, unspecified organism  Hyperkalemia    Rx / DC Orders ED Discharge Orders          Ordered    doxycycline  (VIBRAMYCIN ) 100 MG capsule  2 times daily        10/09/23 1339              Jerilynn Montenegro, MD 10/09/23 1349

## 2023-10-09 NOTE — ED Triage Notes (Signed)
 Pt arrived via [facility] from [facility] in [place], c/o shob and HA per staff at group home. Per EMS, on arrival pt SpO2 was 87-88% RA and was then placed on 2 L [place] and pt SpO2 was 99%. Pts SpO2 ipon ED arrival was 88-89% RA and was placed on 2 L [place] and SpO2 went up to the high 90s. Pt is not on oxygen at baseline. Hx of Schizophrenia and HTN. Also per EMS per facility, , pts speech is at baseline and it is hard to understand her at times, especially without her dentures. Pt denies CP

## 2023-10-09 NOTE — ED Notes (Signed)
 Pt yelling loudly and saying she is leaving.

## 2023-10-09 NOTE — ED Notes (Signed)
 Advised pt could not leave ama and edp calling legal guardian. Pt given meal tray and now eating.

## 2023-11-07 ENCOUNTER — Ambulatory Visit
Admission: EM | Admit: 2023-11-07 | Discharge: 2023-11-07 | Disposition: A | Discharge status: Home / Self Care | Attending: Family Medicine | Admitting: Family Medicine

## 2023-11-07 DIAGNOSIS — M25562 Pain in left knee: Secondary | ICD-10-CM | POA: Diagnosis not present

## 2023-11-07 DIAGNOSIS — M25561 Pain in right knee: Secondary | ICD-10-CM | POA: Diagnosis not present

## 2023-11-07 MED ORDER — DICLOFENAC SODIUM 1 % EX GEL
2.0000 g | Freq: Four times a day (QID) | CUTANEOUS | 1 refills | Status: DC | PRN
Start: 2023-11-07 — End: 2024-01-15

## 2023-11-07 NOTE — ED Triage Notes (Signed)
 Per caregivers, pt has been complaining about right knee pain and bilateral knee swelling x 2 days

## 2023-11-07 NOTE — Discharge Instructions (Signed)
 You may use the gel 4 times daily as needed to help with your knee pain.  Elevate your legs at rest to help with swelling, Tylenol  as needed additionally.

## 2023-11-07 NOTE — ED Provider Notes (Signed)
 [facility]    CSN: [id number] Arrival date & time: 11/07/23  1516      History   Chief Complaint No chief complaint on file.   HPI Carrie Clark is a 63 y.o. female.   Patient presenting today alongside caregivers for evaluation of bilateral knee pain and swelling right greater than left.  Denies bruising, redness, injury, numbness, tingling, decreased range of motion.  So far trying Tylenol  with mild temporary benefit.    Past Medical History:  Diagnosis Date   COPD (chronic obstructive pulmonary disease) (HCC)    CVA (cerebral infarction)    Depression    Hypertension    Hypothyroidism    Schizoaffective disorder, bipolar type (HCC)    Schizophrenia (HCC)    Stroke (HCC)    Tardive dyskinesia    Tardive dyskinesia     Patient Active Problem List   Diagnosis Date Noted   Pain due to onychomycosis of toenails of both feet 10/18/2020   UTI (urinary tract infection) 12/03/2018   Hypothyroidism 12/03/2018   HTN (hypertension) 12/03/2018   COPD (chronic obstructive pulmonary disease) (HCC) 12/03/2018   Diabetes (HCC) 12/03/2018   Altered mental status 12/03/2018   Schizoaffective disorder, bipolar type (HCC) 09/11/2015   Aggression    Episode of behavior change    Psychoses (HCC)     Past Surgical History:  Procedure Laterality Date   NO PAST SURGERIES      OB History   No obstetric history on file.      Home Medications    Prior to Admission medications   Medication Sig Start Date End Date Taking? Authorizing Provider  diclofenac Sodium (VOLTAREN ARTHRITIS PAIN) 1 % GEL Apply 2 g topically 4 (four) times daily as needed. 11/07/23  Yes Corbin Dess, PA-C  acetaminophen  (TYLENOL ) 325 MG tablet Take 2 tablets (650 mg total) by mouth every 6 (six) hours as needed for moderate pain. 01/29/19   Mesner, Reymundo Caulk, MD  albuterol  (PROVENTIL  HFA;VENTOLIN  HFA) 108 (90 Base) MCG/ACT inhaler Inhale 2 puffs into the lungs every 6 (six) hours as needed  for wheezing or shortness of breath. 06/07/15   Mesner, Reymundo Caulk, MD  atorvastatin  (LIPITOR) 40 MG tablet Take 40 mg by mouth at bedtime.     [provider]  atorvastatin  (LIPITOR) 40 MG tablet Take 40 mg by mouth daily.    [provider]  AUSTEDO  9 MG TABS Take 2 tablets by mouth 2 (two) times daily. 12/21/20   [provider]  benzonatate  (TESSALON ) 100 MG capsule Take 1 capsule (100 mg total) by mouth every 8 (eight) hours. 07/23/23   Baxter Limber A, PA-C  cetirizine  (ZYRTEC ) 10 MG chewable tablet Chew 10 mg by mouth daily.    [provider]  cetirizine  (ZYRTEC ) 10 MG tablet Take 1 tablet (10 mg total) by mouth daily. 11/11/14   Withrow, Theadora Fines, FNP  Deutetrabenazine  (AUSTEDO ) 12 MG TABS Take 12 mg by mouth in the morning and at bedtime.    [provider]  Deutetrabenazine  (AUSTEDO ) 9 MG TABS Take by mouth.    [provider]  diazepam  (VALIUM ) 10 MG tablet Take 10 mg by mouth every 6 (six) hours as needed for anxiety.    [provider]  diphenhydrAMINE  (BENADRYL  ALLERGY) 25 MG tablet Take 1 tablet (25 mg total) by mouth at bedtime. Patient not taking: Reported on 12/29/2020 11/17/19 12/17/19  Lubertha Rush, MD  diphenoxylate -atropine  (LOMOTIL ) 2.5-0.025 MG tablet Take 1 tablet by mouth 4 (  four) times daily as needed for diarrhea or loose stools.    [provider]  donepezil  (ARICEPT ) 10 MG tablet Take 10 mg by mouth at bedtime.    [provider]  donepezil  (ARICEPT ) 5 MG tablet Take 5 mg by mouth at bedtime.    [provider]  doxycycline  (VIBRAMYCIN ) 100 MG capsule Take 1 capsule (100 mg total) by mouth 2 (two) times daily. One po bid x 7 days 10/09/23   Jerilynn Montenegro, MD  DULoxetine  (CYMBALTA ) 30 MG capsule Take 1 capsule (30 mg total) by mouth daily. 11/17/19 12/29/20  Lubertha Rush, MD  DULoxetine  (CYMBALTA ) 30 MG capsule Take 30 mg by mouth daily.    [provider]  famotidine  (PEPCID ) 20 MG  tablet Take 1 tablet (20 mg total) by mouth 2 (two) times daily. 11/11/14   Withrow, Theadora Fines, FNP  famotidine  (PEPCID ) 20 MG tablet Take 20 mg by mouth 2 (two) times daily.    [provider]  furosemide  (LASIX ) 20 MG tablet Take 1 tablet (20 mg total) by mouth daily. 11/11/14   Withrow, John C, FNP  Glucagon (GVOKE HYPOPEN 1-PACK) 1 MG/0.2ML SOAJ Inject 1 mg into the skin daily as needed.    [provider]  glycopyrrolate (ROBINUL) 1 MG tablet Take 1 mg by mouth 3 (three) times daily.    [provider]  haloperidol  (HALDOL ) 0.5 MG tablet Take 1 tablet (0.5 mg total) by mouth every morning. 11/17/19 12/29/20  Lubertha Rush, MD  haloperidol  (HALDOL ) 2 MG tablet Take 2 tablets (4 mg total) by mouth at bedtime. 11/17/19 12/29/20  Lubertha Rush, MD  haloperidol  decanoate (HALDOL  DECANOATE) 100 MG/ML injection Inject 0.25 mLs (25 mg total) into the muscle every 28 (twenty-eight) days for 1 day. 12/17/19 12/29/20  Lubertha Rush, MD  insulin  aspart (NOVOLOG ) 100 UNIT/ML injection Inject 1-10 Units into the skin 3 (three) times daily before meals. Sliding scale    [provider]  insulin  detemir (LEVEMIR ) 100 UNIT/ML injection Inject 20-25 Units into the skin See admin instructions. Inject 25 units at 8 am and 20 units 5 pm    [provider]  Insulin  Detemir (LEVEMIR ) 100 UNIT/ML Pen Inject 25 Units into the skin 2 (two) times daily. Inject 25 units at 8 AM and then inject 20 units at 5 PM    [provider]  ketoconazole  (NIZORAL ) 2 % cream Apply 1 application topically 2 (two) times daily as needed for irritation. 06/08/21   Corbin Dess, PA-C  levothyroxine  (SYNTHROID ) 100 MCG tablet Take 100 mcg by mouth daily before breakfast.     [provider]  levothyroxine  (SYNTHROID ) 100 MCG tablet Take 100 mcg by mouth daily before breakfast.    [provider]  lisinopril  (PRINIVIL ,ZESTRIL ) 5 MG tablet Take 5 mg by mouth daily.    [provider]  LORazepam  (ATIVAN ) 1 MG tablet Take 1 mg by mouth 3 (three) times daily. 12/28/20   [provider]  Melatonin 3 MG SUBL Take 10 tablets by mouth at bedtime. 11/29/20   [provider]  metFORMIN  (GLUCOPHAGE ) 1000 MG tablet Take 1 tablet (1,000 mg total) by mouth 2 (two) times daily. 09/11/15   Lissa Riding, NP  metFORMIN  (GLUCOPHAGE ) 500 MG tablet Take 500 mg by mouth 2 (two) times daily. 12/29/20   [provider]  metFORMIN  (GLUCOPHAGE ) 500 MG tablet Take 500 mg by mouth 2 (two) times daily with a meal.  [provider]  Multiple Vitamins-Minerals (MULTIVITAMIN WITH MINERALS) tablet Take 1 tablet by mouth daily. 11/11/14   Withrow, Theadora Fines, FNP  Oxcarbazepine  (TRILEPTAL ) 300 MG tablet Take 300 mg by mouth 2 (two) times daily. 12/28/20   [provider]  potassium chloride  (KLOR-CON ) 10 MEQ tablet Take 10 mEq by mouth daily. 12/28/20   [provider]  prazosin  (MINIPRESS ) 1 MG capsule Take 1 mg by mouth at bedtime. 12/28/20   [provider]  QUEtiapine  (SEROQUEL ) 100 MG tablet Take 150 mg by mouth at bedtime. 12/27/20   [provider]  tiotropium (SPIRIVA ) 18 MCG inhalation capsule Place 18 mcg into inhaler and inhale daily.    [provider]  traZODone  (DESYREL ) 50 MG tablet Take 50 mg by mouth at bedtime. 12/28/20   [provider]  trihexyphenidyl (ARTANE) 2 MG tablet Take 2 mg by mouth daily as needed. 12/27/20   [provider]  VRAYLAR  3 MG capsule Take 3 mg by mouth daily. 11/29/20   [provider]  zolpidem  (AMBIEN ) 5 MG tablet Take 5 mg by mouth at bedtime as needed. 12/28/20   [provider]  fluPHENAZine  (PROLIXIN ) 10 MG tablet Take 1 tablet (10 mg total) by mouth 3 (three) times daily. 12/03/18 11/17/19  Mody, Sital, MD  fluPHENAZine  decanoate (PROLIXIN ) 25 MG/ML injection Inject 50 mg into the muscle every 28 (twenty-eight) days.  11/17/19  [provider]   lurasidone  (LATUDA ) 20 MG TABS tablet Take 20 mg by mouth daily in the afternoon.   11/17/19  [provider]    Family History Family History  Family history unknown: Yes    Social History Social History   Tobacco Use   Smoking status: Never   Smokeless tobacco: Never  Vaping Use   Vaping status: Never Used  Substance Use Topics   Alcohol use: Not Currently   Drug use: Not Currently     Allergies   Fish allergy   Review of Systems Review of Systems Per HPI  Physical Exam Triage Vital Signs ED Triage Vitals  Encounter Vitals Group     BP 11/07/23 1525 (!) 160/84     Girls Systolic BP Percentile --      Girls Diastolic BP Percentile --      Boys Systolic BP Percentile --      Boys Diastolic BP Percentile --      Pulse Rate 11/07/23 1525 79     Resp 11/07/23 1525 20     Temp 11/07/23 1525 97.6 F (36.4 C)     Temp Source 11/07/23 1525 Temporal     SpO2 11/07/23 1525 91 %     Weight --      Height --      Head Circumference --      Peak Flow --      Pain Score 11/07/23 1526 10     Pain Loc --      Pain Education --      Exclude from Growth Chart --    No data found.  Updated Vital Signs BP (!) 160/84 (BP Location: Right Arm)   Pulse 79   Temp 97.6 F (36.4 C) (Temporal)   Resp 20   SpO2 91%   Visual Acuity Right Eye Distance:   Left Eye Distance:   Bilateral Distance:    Right Eye Near:   Left Eye Near:    Bilateral Near:     Physical Exam Vitals and nursing note reviewed.  Constitutional:      Appearance: Normal appearance. She is not ill-appearing.  HENT:     Head: Atraumatic.   Eyes:     Extraocular Movements: Extraocular movements intact.     Conjunctiva/sclera: Conjunctivae normal.    Cardiovascular:     Rate and Rhythm: Normal rate and regular rhythm.     Heart sounds: Normal heart sounds.  Pulmonary:     Effort: Pulmonary effort is normal.     Breath sounds: Normal breath sounds.   Musculoskeletal:         General: Swelling present. Normal range of motion.     Cervical back: Normal range of motion and neck supple.     Comments: Trace edema bilateral knees right greater than left.  Diffuse anterior tenderness to palpation with no bony deformity palpable.  Negative drawer testing, McMurray's, no joint instability   Skin:    General: Skin is warm and dry.   Neurological:     Mental Status: She is alert and oriented to person, place, and time.     Motor: No weakness.     Gait: Gait normal.     Comments: Bilateral lower extremities neurovascularly intact  Psychiatric:        Mood and Affect: Mood normal.        Thought Content: Thought content normal.        Judgment: Judgment normal.      UC Treatments / Results  Labs (all labs ordered are listed, but only abnormal results are displayed) Labs Reviewed - No data to display  EKG   Radiology No results found.  Procedures Procedures (including critical care time)  Medications Ordered in UC Medications - No data to display  Initial Impression / Assessment and Plan / UC Course  I have reviewed the triage vital signs and the nursing notes.  Pertinent labs & imaging results that were available during my care of the patient were reviewed by me and considered in my medical decision making (see chart for details).     Suspect arthritic/inflammatory.  Trial Voltaren gel, ice, elevation, Tylenol  as needed.  Return for worsening symptoms.  Final Clinical Impressions(s) / UC Diagnoses   Final diagnoses:  Acute pain of both knees     Discharge Instructions      You may use the gel 4 times daily as needed to help with your knee pain.  Elevate your legs at rest to help with swelling, Tylenol  as needed additionally.    ED Prescriptions     Medication Sig Dispense Auth. Provider   diclofenac Sodium (VOLTAREN ARTHRITIS PAIN) 1 % GEL Apply 2 g topically 4 (four) times daily as needed. 150 g Corbin Dess, [place]       PDMP not reviewed this encounter.   Corbin Dess, [place] 11/07/23 1656

## 2023-12-25 ENCOUNTER — Ambulatory Visit: Admitting: Podiatry

## 2024-01-01 ENCOUNTER — Ambulatory Visit: Admitting: Podiatry

## 2024-01-15 ENCOUNTER — Emergency Department (HOSPITAL_COMMUNITY)

## 2024-01-15 ENCOUNTER — Encounter (HOSPITAL_COMMUNITY): Payer: Self-pay

## 2024-01-15 ENCOUNTER — Emergency Department (HOSPITAL_COMMUNITY)
Admission: EM | Admit: 2024-01-15 | Discharge: 2024-01-15 | Source: Skilled Nursing Facility | Attending: Emergency Medicine | Admitting: Emergency Medicine

## 2024-01-15 ENCOUNTER — Other Ambulatory Visit: Payer: Self-pay

## 2024-01-15 DIAGNOSIS — R109 Unspecified abdominal pain: Secondary | ICD-10-CM | POA: Insufficient documentation

## 2024-01-15 DIAGNOSIS — R739 Hyperglycemia, unspecified: Secondary | ICD-10-CM | POA: Diagnosis not present

## 2024-01-15 DIAGNOSIS — R079 Chest pain, unspecified: Secondary | ICD-10-CM | POA: Insufficient documentation

## 2024-01-15 DIAGNOSIS — R1084 Generalized abdominal pain: Secondary | ICD-10-CM | POA: Insufficient documentation

## 2024-01-15 DIAGNOSIS — Z794 Long term (current) use of insulin: Secondary | ICD-10-CM | POA: Insufficient documentation

## 2024-01-15 DIAGNOSIS — R197 Diarrhea, unspecified: Secondary | ICD-10-CM | POA: Insufficient documentation

## 2024-01-15 DIAGNOSIS — R0602 Shortness of breath: Secondary | ICD-10-CM | POA: Diagnosis not present

## 2024-01-15 DIAGNOSIS — R112 Nausea with vomiting, unspecified: Secondary | ICD-10-CM | POA: Insufficient documentation

## 2024-01-15 DIAGNOSIS — I700 Atherosclerosis of aorta: Secondary | ICD-10-CM | POA: Insufficient documentation

## 2024-01-15 DIAGNOSIS — N200 Calculus of kidney: Secondary | ICD-10-CM | POA: Diagnosis not present

## 2024-01-15 DIAGNOSIS — J441 Chronic obstructive pulmonary disease with (acute) exacerbation: Secondary | ICD-10-CM

## 2024-01-15 DIAGNOSIS — D3502 Benign neoplasm of left adrenal gland: Secondary | ICD-10-CM | POA: Diagnosis not present

## 2024-01-15 LAB — CBC WITH DIFFERENTIAL/PLATELET
Abs Immature Granulocytes: 0.02 K/uL (ref 0.00–0.07)
Basophils Absolute: 0 K/uL (ref 0.0–0.1)
Basophils Relative: 1 %
Eosinophils Absolute: 0.2 K/uL (ref 0.0–0.5)
Eosinophils Relative: 4 %
HCT: 33.9 % — ABNORMAL LOW (ref 36.0–46.0)
Hemoglobin: 10.5 g/dL — ABNORMAL LOW (ref 12.0–15.0)
Immature Granulocytes: 0 %
Lymphocytes Relative: 30 %
Lymphs Abs: 1.9 K/uL (ref 0.7–4.0)
MCH: 29.6 pg (ref 26.0–34.0)
MCHC: 31 g/dL (ref 30.0–36.0)
MCV: 95.5 fL (ref 80.0–100.0)
Monocytes Absolute: 0.6 K/uL (ref 0.1–1.0)
Monocytes Relative: 9 %
Neutro Abs: 3.6 K/uL (ref 1.7–7.7)
Neutrophils Relative %: 56 %
Platelets: 229 K/uL (ref 150–400)
RBC: 3.55 MIL/uL — ABNORMAL LOW (ref 3.87–5.11)
RDW: 14.4 % (ref 11.5–15.5)
WBC: 6.4 K/uL (ref 4.0–10.5)
nRBC: 0 % (ref 0.0–0.2)

## 2024-01-15 LAB — LIPASE, BLOOD: Lipase: 28 U/L (ref 11–51)

## 2024-01-15 LAB — RESP PANEL BY RT-PCR (RSV, FLU A&B, COVID)  RVPGX2
Influenza A by PCR: NEGATIVE
Influenza B by PCR: NEGATIVE
Resp Syncytial Virus by PCR: NEGATIVE
SARS Coronavirus 2 by RT PCR: NEGATIVE

## 2024-01-15 LAB — COMPREHENSIVE METABOLIC PANEL WITH GFR
ALT: 20 U/L (ref 0–44)
AST: 19 U/L (ref 15–41)
Albumin: 3.1 g/dL — ABNORMAL LOW (ref 3.5–5.0)
Alkaline Phosphatase: 136 U/L — ABNORMAL HIGH (ref 38–126)
Anion gap: 9 (ref 5–15)
BUN: 34 mg/dL — ABNORMAL HIGH (ref 8–23)
CO2: 31 mmol/L (ref 22–32)
Calcium: 8.8 mg/dL — ABNORMAL LOW (ref 8.9–10.3)
Chloride: 100 mmol/L (ref 98–111)
Creatinine, Ser: 1.34 mg/dL — ABNORMAL HIGH (ref 0.44–1.00)
GFR, Estimated: 45 mL/min — ABNORMAL LOW (ref >60)
Glucose, Bld: 315 mg/dL — ABNORMAL HIGH (ref 70–99)
Potassium: 3.8 mmol/L (ref 3.5–5.1)
Sodium: 140 mmol/L (ref 135–145)
Total Bilirubin: 0.5 mg/dL (ref 0.0–1.2)
Total Protein: 6.3 g/dL — ABNORMAL LOW (ref 6.5–8.1)

## 2024-01-15 LAB — URINALYSIS, ROUTINE W REFLEX MICROSCOPIC
Bacteria, UA: NONE SEEN
Bilirubin Urine: NEGATIVE
Glucose, UA: NEGATIVE mg/dL
Hgb urine dipstick: NEGATIVE
Ketones, ur: NEGATIVE mg/dL
Nitrite: NEGATIVE
Protein, ur: NEGATIVE mg/dL
Specific Gravity, Urine: 1.011 (ref 1.005–1.030)
pH: 5 (ref 5.0–8.0)

## 2024-01-15 LAB — CBG MONITORING, ED: Glucose-Capillary: 153 mg/dL — ABNORMAL HIGH (ref 70–99)

## 2024-01-15 LAB — TROPONIN I (HIGH SENSITIVITY): Troponin I (High Sensitivity): 6 ng/L (ref <18)

## 2024-01-15 MED ORDER — ALBUTEROL SULFATE HFA 108 (90 BASE) MCG/ACT IN AERS: 1.0000 | INHALATION_SPRAY | Freq: Four times a day (QID) | RESPIRATORY_TRACT | 0 refills | Status: DC | PRN

## 2024-01-15 MED ORDER — DOXYCYCLINE HYCLATE 100 MG PO CAPS
100.0000 mg | ORAL_CAPSULE | Freq: Two times a day (BID) | ORAL | 0 refills | Status: AC
Start: 2024-01-15 — End: 2024-01-22

## 2024-01-15 MED ORDER — IPRATROPIUM-ALBUTEROL 0.5-2.5 (3) MG/3ML IN SOLN
3.0000 mL | Freq: Once | RESPIRATORY_TRACT | Status: AC
  Filled 2024-01-15: qty 3

## 2024-01-15 MED ORDER — STERILE WATER FOR INJECTION IJ SOLN
INTRAMUSCULAR | Status: AC
  Filled 2024-01-15: qty 10

## 2024-01-15 MED ORDER — ZIPRASIDONE MESYLATE 20 MG IM SOLR
20.0000 mg | Freq: Once | INTRAMUSCULAR | Status: AC
  Administered 2024-01-15: 20 mg via INTRAMUSCULAR
  Filled 2024-01-15 (×2): qty 20

## 2024-01-15 MED ORDER — IPRATROPIUM-ALBUTEROL 0.5-2.5 (3) MG/3ML IN SOLN
RESPIRATORY_TRACT | Status: AC
  Administered 2024-01-15: 3 mL via RESPIRATORY_TRACT
  Filled 2024-01-15: qty 6

## 2024-01-15 MED ORDER — ONDANSETRON HCL 4 MG/2ML IJ SOLN
4.0000 mg | Freq: Once | INTRAMUSCULAR | Status: DC
  Filled 2024-01-15: qty 2

## 2024-01-15 MED ORDER — IPRATROPIUM-ALBUTEROL 0.5-2.5 (3) MG/3ML IN SOLN
3.0000 mL | Freq: Once | RESPIRATORY_TRACT | Status: AC
  Administered 2024-01-15: 3 mL via RESPIRATORY_TRACT
  Filled 2024-01-15: qty 3

## 2024-01-15 MED ORDER — MIDAZOLAM HCL 5 MG/5ML IJ SOLN
4.0000 mg | Freq: Once | INTRAMUSCULAR | Status: AC
  Administered 2024-01-15: 4 mg via INTRAMUSCULAR
  Filled 2024-01-15: qty 5

## 2024-01-15 MED ORDER — INSULIN ASPART 100 UNIT/ML IJ SOLN
10.0000 [IU] | Freq: Once | INTRAMUSCULAR | Status: AC
  Administered 2024-01-15: 10 [IU] via SUBCUTANEOUS
  Filled 2024-01-15: qty 1

## 2024-01-15 MED ORDER — SODIUM CHLORIDE 0.9 % IV BOLUS: 1000.0000 mL | Freq: Once | INTRAVENOUS | Status: DC

## 2024-01-15 NOTE — ED Notes (Signed)
 Spoke with Chance (Legal Guardian) about address where pt will be sent. Guardian unable to verify the address and upon calling again, she did not answer.
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
## 2024-01-15 NOTE — ED Notes (Addendum)
 Pt still refusing IV placement and covid swab.

## 2024-01-15 NOTE — ED Provider Notes (Signed)
  Physical Exam  BP (!) 158/73 (BP Location: Left Arm)   Pulse 60   Temp 97.6 F (36.4 C) (Oral)   Resp 18   Ht 5' 4 (1.626 m)   Wt 73.5 kg   SpO2 94%   BMI 27.81 kg/m   Physical Exam HENT:     Head: Normocephalic.  Neurological:     General: No focal deficit present.     Mental Status: She is alert and oriented to person, place, and time.     Procedures  Procedures  ED Course / MDM    Medical Decision Making Amount and/or Complexity of Data Reviewed Labs: ordered. Radiology: ordered.  Risk Prescription drug management.   Abdominal pain also having COPD exacerbation.workup is reassuring, she is feeling better after breathing treatment.  Plan to discharge on doxycycline  and a butyryl inhaler for COPD exacerbation.  Given hyperglycemia will avoid steroids at this time.  She is not requiring oxygen and is appropriate for discharge home.  Pending urinalysis to rule out UTI given her abdominal pain.  Signout taken from [employer], [place].  Reevaluation by me, patient has clear lung sounds, satting 93 to 94% on room air, she has no complaints at this time, states her pain is all resolved.  She denies any burning with urination, UA showed small leukocytes with 16 white blood cells with no bacteria.  Sent for culture but will hold off on treatment at this time. Will send home patient with antibiotics and inhaler for COPD exacerbation, will avoid steroids at this time due to patient's hyperglycemia and poorly controlled diabetes overall.       Carrie Clark 01/15/24 2030    Suzette Pac, MD 01/16/24 [phone]

## 2024-01-15 NOTE — ED Provider Notes (Signed)
 [place] EMERGENCY DEPARTMENT AT [facility] Provider Note   CSN: [id number] Arrival date & time: 01/15/24  1326     Patient presents with: Hyperglycemia   Carrie Clark is a 63 y.o. female.   Patient is a 63 year old female who presents emergency department the chief complaint of chest pain and abdominal pain.  She notes the symptoms have been ongoing for the past few days.  She does admit to associated nausea, vomiting, diarrhea.  She does also note that she has had some shortness of breath.  She denies any dizziness, lightheadedness or syncope.  She denies any recent falls.  She does admit to some generalized myalgias as well.  She was noted to be hyperglycemic by EMS.   Hyperglycemia Associated symptoms: abdominal pain and chest pain        Prior to Admission medications   Medication Sig Start Date End Date Taking? Authorizing Provider  acetaminophen  (TYLENOL ) 325 MG tablet Take 2 tablets (650 mg total) by mouth every 6 (six) hours as needed for moderate pain. 01/29/19   Mesner, Selinda, MD  albuterol  (PROVENTIL  HFA;VENTOLIN  HFA) 108 (90 Base) MCG/ACT inhaler Inhale 2 puffs into the lungs every 6 (six) hours as needed for wheezing or shortness of breath. 06/07/15   Mesner, Selinda, MD  atorvastatin  (LIPITOR) 40 MG tablet Take 40 mg by mouth at bedtime.     [provider]  atorvastatin  (LIPITOR) 40 MG tablet Take 40 mg by mouth daily.    [provider]  AUSTEDO  9 MG TABS Take 2 tablets by mouth 2 (two) times daily. 12/21/20   [provider]  benzonatate  (TESSALON ) 100 MG capsule Take 1 capsule (100 mg total) by mouth every 8 (eight) hours. 07/23/23   Suellen Cantor A, PA-C  cetirizine  (ZYRTEC ) 10 MG chewable tablet Chew 10 mg by mouth daily.    [provider]  cetirizine  (ZYRTEC ) 10 MG tablet Take 1 tablet (10 mg total) by mouth daily. 11/11/14   Withrow, Norleen BROCKS, FNP  Deutetrabenazine  (AUSTEDO ) 12 MG TABS Take 12 mg by mouth in the morning  and at bedtime.    [provider]  Deutetrabenazine  (AUSTEDO ) 9 MG TABS Take by mouth.    [provider]  diazepam  (VALIUM ) 10 MG tablet Take 10 mg by mouth every 6 (six) hours as needed for anxiety.    [provider]  diclofenac  Sodium (VOLTAREN  ARTHRITIS PAIN) 1 % GEL Apply 2 g topically 4 (four) times daily as needed. 11/07/23   [name] Vernell Norris, PA-C  diphenhydrAMINE  (BENADRYL  ALLERGY) 25 MG tablet Take 1 tablet (25 mg total) by mouth at bedtime. Patient not taking: Reported on 12/29/2020 11/17/19 12/17/19  Ernest Ronal BRAVO, MD  diphenoxylate -atropine  (LOMOTIL ) 2.5-0.025 MG tablet Take 1 tablet by mouth 4 (four) times daily as needed for diarrhea or loose stools.    [provider]  donepezil  (ARICEPT ) 10 MG tablet Take 10 mg by mouth at bedtime.    [provider]  donepezil  (ARICEPT ) 5 MG tablet Take 5 mg by mouth at bedtime.    [provider]  doxycycline  (VIBRAMYCIN ) 100 MG capsule Take 1 capsule (100 mg total) by mouth 2 (two) times daily. One po bid x 7 days 10/09/23   Freddi Hamilton, MD  DULoxetine  (CYMBALTA ) 30 MG capsule Take 1 capsule (30 mg total) by mouth daily. 11/17/19 12/29/20  Ernest Ronal BRAVO, MD  DULoxetine  (CYMBALTA ) 30 MG capsule Take 30 mg by mouth daily.    [provider]  famotidine  (PEPCID ) 20 MG tablet Take 1 tablet (20 mg total) by mouth 2 (two) times daily. 11/11/14   Withrow, Norleen BROCKS, FNP  famotidine  (PEPCID ) 20 MG tablet Take 20 mg by mouth 2 (two) times daily.    [provider]  furosemide  (LASIX ) 20 MG tablet Take 1 tablet (20 mg total) by mouth daily. 11/11/14   Withrow, John C, FNP  Glucagon (GVOKE HYPOPEN 1-PACK) 1 MG/0.2ML SOAJ Inject 1 mg into the skin daily as needed.    [provider]  glycopyrrolate (ROBINUL) 1 MG tablet Take 1 mg by mouth 3 (three) times daily.    [provider]  haloperidol  (HALDOL ) 0.5 MG tablet Take 1 tablet (0.5 mg total) by mouth every morning.  11/17/19 12/29/20  Ernest Ronal BRAVO, MD  haloperidol  (HALDOL ) 2 MG tablet Take 2 tablets (4 mg total) by mouth at bedtime. 11/17/19 12/29/20  Ernest Ronal BRAVO, MD  haloperidol  decanoate (HALDOL  DECANOATE) 100 MG/ML injection Inject 0.25 mLs (25 mg total) into the muscle every 28 (twenty-eight) days for 1 day. 12/17/19 12/29/20  Ernest Ronal BRAVO, MD  insulin  aspart (NOVOLOG ) 100 UNIT/ML injection Inject 1-10 Units into the skin 3 (three) times daily before meals. Sliding scale    [provider]  insulin  detemir (LEVEMIR ) 100 UNIT/ML injection Inject 20-25 Units into the skin See admin instructions. Inject 25 units at 8 am and 20 units 5 pm    [provider]  Insulin  Detemir (LEVEMIR ) 100 UNIT/ML Pen Inject 25 Units into the skin 2 (two) times daily. Inject 25 units at 8 AM and then inject 20 units at 5 PM    [provider]  ketoconazole  (NIZORAL ) 2 % cream Apply 1 application topically 2 (two) times daily as needed for irritation. 06/08/21   [name] Vernell Norris, PA-C  levothyroxine  (SYNTHROID ) 100 MCG tablet Take 100 mcg by mouth daily before breakfast.     [provider]  levothyroxine  (SYNTHROID ) 100 MCG tablet Take 100 mcg by mouth daily before breakfast.    [provider]  lisinopril  (PRINIVIL ,ZESTRIL ) 5 MG tablet Take 5 mg by mouth daily.    [provider]  LORazepam  (ATIVAN ) 1 MG tablet Take 1 mg by mouth 3 (three) times daily. 12/28/20   [provider]  Melatonin 3 MG SUBL Take 10 tablets by mouth at bedtime. 11/29/20   [provider]  metFORMIN  (GLUCOPHAGE ) 1000 MG tablet Take 1 tablet (1,000 mg total) by mouth 2 (two) times daily. 09/11/15   Jacquetta Sharlot GRADE, NP  metFORMIN  (GLUCOPHAGE ) 500 MG tablet Take 500 mg by mouth 2 (two) times daily. 12/29/20   [provider]  metFORMIN  (GLUCOPHAGE ) 500 MG tablet Take 500 mg by mouth 2 (two) times daily with a meal.    [provider]  Multiple Vitamins-Minerals (MULTIVITAMIN  WITH MINERALS) tablet Take 1 tablet by mouth daily. 11/11/14   Withrow, Norleen BROCKS, FNP  Oxcarbazepine  (TRILEPTAL ) 300 MG tablet Take 300 mg by mouth 2 (two) times daily. 12/28/20   [provider]  potassium chloride  (KLOR-CON ) 10 MEQ tablet Take 10 mEq by mouth daily. 12/28/20   [provider]  prazosin  (MINIPRESS ) 1 MG capsule Take 1 mg by mouth at bedtime. 12/28/20   [provider]  QUEtiapine  (SEROQUEL ) 100 MG tablet Take 150 mg by mouth at bedtime. 12/27/20   [provider]  tiotropium (SPIRIVA ) 18 MCG inhalation capsule Place 18 mcg into inhaler and inhale daily.    [provider]  traZODone  (DESYREL ) 50 MG tablet Take 50 mg by mouth at bedtime. 12/28/20   [provider]  trihexyphenidyl (ARTANE) 2 MG tablet Take 2 mg by mouth daily as needed. 12/27/20   [provider]  VRAYLAR  3 MG capsule Take 3 mg by mouth daily. 11/29/20   [provider]  zolpidem  (AMBIEN ) 5 MG tablet Take 5 mg by mouth at bedtime as needed. 12/28/20   [provider]  fluPHENAZine  (PROLIXIN ) 10 MG tablet Take 1 tablet (10 mg total) by mouth 3 (three) times daily. 12/03/18 11/17/19  Mody, Sital, MD  fluPHENAZine  decanoate (PROLIXIN ) 25 MG/ML injection Inject 50 mg into the muscle every 28 (twenty-eight) days.  11/17/19  [provider]  lurasidone  (LATUDA ) 20 MG TABS tablet Take 20 mg by mouth daily in the afternoon.   11/17/19  [provider]    Allergies: Fish allergy    Review of Systems  Cardiovascular:  Positive for chest pain.  Gastrointestinal:  Positive for abdominal pain.  Musculoskeletal:  Positive for myalgias.  All other systems reviewed and are negative.   Updated Vital Signs BP (!) 147/70 (BP Location: Right Arm)   Pulse 75   Temp (!) 97.5 F (36.4 C) (Oral)   Resp 16   Ht 5' 4 (1.626 m)   Wt 73.5 kg   SpO2 90%   BMI 27.81 kg/m   Physical Exam Vitals and nursing note reviewed.  Constitutional:       General: She is not in acute distress.    Appearance: Normal appearance. She is not ill-appearing.  HENT:     Head: Normocephalic and atraumatic.     Nose: Nose normal.     Mouth/Throat:     Mouth: Mucous membranes are moist.  Eyes:     Extraocular Movements: Extraocular movements intact.     Conjunctiva/sclera: Conjunctivae normal.     Pupils: Pupils are equal, round, and reactive to light.  Cardiovascular:     Rate and Rhythm: Normal rate and regular rhythm.     Pulses: Normal pulses.     Heart sounds: Normal heart sounds. No murmur heard.    No gallop.  Pulmonary:     Effort: Pulmonary effort is normal. No respiratory distress.     Breath sounds: No stridor. Wheezing present. No rhonchi or rales.  Abdominal:     General: Abdomen is flat. Bowel sounds are normal. There is no distension.     Palpations: Abdomen is soft.     Tenderness: There is abdominal tenderness. There is no guarding.  Musculoskeletal:        General: Normal range of motion.     Cervical back: Normal range of motion and neck supple.     Right lower leg: No edema.     Left lower leg: No edema.  Skin:    General: Skin is warm and dry.     Findings: No bruising or rash.  Neurological:     General: No focal deficit present.     Mental Status: She is alert and oriented to person, place, and time. Mental status is at baseline.     Cranial Nerves: No cranial nerve deficit.     Sensory: No sensory deficit.     Motor: No weakness.     Coordination: Coordination normal.     Gait: Gait normal.  Psychiatric:        Mood and Affect: Mood normal.        Behavior: Behavior normal.  Thought Content: Thought content normal.        Judgment: Judgment normal.     (all labs ordered are listed, but only abnormal results are displayed) Labs Reviewed  RESP PANEL BY RT-PCR (RSV, FLU A&B, COVID)  [id number]  COMPREHENSIVE METABOLIC PANEL WITH GFR  LIPASE, BLOOD  CBC WITH DIFFERENTIAL/PLATELET  URINALYSIS, ROUTINE W  REFLEX MICROSCOPIC  D-DIMER, QUANTITATIVE  TROPONIN I (HIGH SENSITIVITY)    EKG: None  Radiology: No results found.   Procedures   Medications Ordered in the ED  sodium chloride  0.9 % bolus 1,000 mL (has no administration in time range)  ipratropium-albuterol  (DUONEB) 0.5-2.5 (3) MG/3ML nebulizer solution 3 mL (has no administration in time range)  ipratropium-albuterol  (DUONEB) 0.5-2.5 (3) MG/3ML nebulizer solution 3 mL (has no administration in time range)  ondansetron  (ZOFRAN ) injection 4 mg (has no administration in time range)                                    Medical Decision Making Amount and/or Complexity of Data Reviewed Labs: ordered. Radiology: ordered.  Risk Prescription drug management.   This patient presents to the ED for concern of chest pain, abdominal pain differential diagnosis includes ACS, pulmonary embolus, pericarditis, myocarditis, endocarditis, pneumonia, pneumothorax, hemothorax, aortic aneurysm or dissection, acute appendicitis, cholecystitis close bowel torsion, diverticulitis, ovarian torsion or cyst, PID, tumor and abscess, pyonephritis, kidney stone, pancreatitis, mesenteric ischemia    Additional history obtained:  Additional history obtained from medical records External records from outside source obtained and reviewed including medical records   Lab Tests:  I Ordered, and personally interpreted labs.  The pertinent results include: No leukocytosis, mild anemia, creatinine at baseline, normal liver function, normal electrolytes, hyperglycemia, normal anion gap and bicarb, negative viral swab, negative lipase, negative troponin   Imaging Studies ordered:  I ordered imaging studies including chest x-ray, CT scan of abdomen and pelvis I independently visualized and interpreted imaging which showed no acute cardiopulmonary process, no acute intra-abdominal surgical process I agree with the radiologist interpretation   Medicines  ordered and prescription drug management:  I ordered medication including DuoNeb, insulin , Versed , Geodon  for COPD, hyperglycemia, agitation Reevaluation of the patient after these medicines showed that the patient improved I have reviewed the patients home medicines and have made adjustments as needed   Problem List / ED Course:  Patient is doing well at this time and does remain stable.  Urinalysis is still pending at this point.  All workup in the emergency department has otherwise been unremarkable.  She has no indication for DKA or HHS.  Viral swab is negative.  CT scan of abdomen pelvis demonstrated no signs of acute surgical process.  Suspect COPD exacerbation.  Did initially discuss the patient's case with her power of attorney Carrie Clark who noted that we were okay with giving medications to help relax her to get our workup completed in the emergency department hence the reason she was given Geodon  and Versed . Will sign patient out to [facility], PA-C, pending urinalysis results and final dispo   Social Determinants of Health:  None        Final diagnoses:  None    ED Discharge Orders     None          Carrie Clark Carrie Clark 01/15/24 Carrie Clark Dean Clarity, MD 01/17/24 [phone]

## 2024-01-15 NOTE — ED Notes (Signed)
 Pt wanting to sit on edge of bed. Asked for her to sit back but pt refused. Asked if she would be better in a recliner and pt agreed. Pt in recliner sitting back with chair alarm on and call bell.

## 2024-01-15 NOTE — ED Notes (Signed)
 Pt is rerfusing blood draw and IV placement. Pt states, Blood from finger or no blood

## 2024-01-15 NOTE — ED Triage Notes (Signed)
 Pt arrived via [facility] from [facility] with generalized pain all over. Blood sugar w/ EMS was 407.

## 2024-01-15 NOTE — Discharge Instructions (Addendum)
 Seen today for high blood sugar and pain all over.  You were given medicine to help with your symptoms and insulin  to lower your blood sugar, and inhaler for your wheezing.  You were seen with antibiotics and inhaler for your COPD.  Follow-up closely with primary care doctor.  Come back to the ER for new or worsening symptoms. [facility] Primary Care Doctor List    Rollene Pesa, MD. Specialty: [facility] Medicine Contact information: [address]  [place] [address]  [phone]   Glendia Fielding, MD. Specialty: [facility] Medicine Contact information: [address]  [place] [address]  [phone]   Benita Outhouse, MD Specialty: Internal Medicine Contact information: [address] [place] [address]  [phone]   Darlyn Hurst, MD. Specialty: Internal Medicine Contact information: [address]  [place] [address]  [phone]    [facility] (Dr. Luke) Specialty: Family Medicine Contact information: [address]  [place] [address]  [phone]   Garnette Lolling, MD. Specialty: [facility] Medicine Contact information: [address]  [address]  [place] [address]  [phone]   Gaither Langton, MD. Specialty: Internal Medicine Contact information: [address]  [address]  [place] [address]  [phone]   [facility] Family Medicine: [address] [phone]  [place], Family medicine [address]  [phone]  [facility] [address] [place], [place] [phone]  [facility]: [address] [phone]    [facility] Bernardino  [address] [place], [place] [address] [phone]  Services [facility] offers a variety of basic health services.  Services include but are not limited to: Blood pressure checks  Heart rate checks  Blood sugar checks  Urine analysis  Rapid strep tests   Pregnancy tests.  Health education and referrals  People needing more complex services will be directed to a physician online. Using these virtual visits, doctors can evaluate and prescribe medicine and treatments. There will be no medication on-site, though [facility] will help patients fill their prescriptions at little to no cost.   For More information please go to: [email or web address]  Allergy and Asthma:    [address] [place] [phone]  Urology:  [address]  [place] [phone]  [facility]  [address] [place], [place] [phone]  Orthopedics   [address] [place], [place] [phone]  Endocrinology  [address] [place], [place] [phone]  Podiatry: [facility] [phone]

## 2024-01-15 NOTE — ED Notes (Signed)
 Pt just returned from ct. Still refusing IV.

## 2024-01-15 NOTE — ED Notes (Signed)
 Pt being more compliant now and cooperative.

## 2024-01-15 NOTE — ED Notes (Signed)
 Iv placed and blood drawn, pt pulled IV out before final tape and swung at staff. Upon covid swab and breathing treatment, pt hit ED staff and stated, I am mean.

## 2024-01-15 NOTE — ED Notes (Signed)
 Attempted to get UA sample when patient went to bathroom Urine missed hat and ran straight to back of toilet Will attempt again when pt has a need to go to the bathroom

## 2024-01-15 NOTE — ED Notes (Addendum)
 Got in touch with group home administrator. Address for pt is [address] [place], [place] [address]. Report given over the phone.

## 2024-01-18 LAB — URINE CULTURE: Culture: >=100000 — AB

## 2024-01-20 ENCOUNTER — Encounter: Payer: Self-pay | Admitting: Podiatry

## 2024-01-20 ENCOUNTER — Ambulatory Visit (INDEPENDENT_AMBULATORY_CARE_PROVIDER_SITE_OTHER): Admitting: Podiatry

## 2024-01-20 DIAGNOSIS — M79675 Pain in left toe(s): Secondary | ICD-10-CM

## 2024-01-20 DIAGNOSIS — E119 Type 2 diabetes mellitus without complications: Secondary | ICD-10-CM

## 2024-01-20 DIAGNOSIS — B351 Tinea unguium: Secondary | ICD-10-CM | POA: Diagnosis not present

## 2024-01-20 DIAGNOSIS — Z794 Long term (current) use of insulin: Secondary | ICD-10-CM

## 2024-01-20 DIAGNOSIS — M79674 Pain in right toe(s): Secondary | ICD-10-CM

## 2024-01-20 NOTE — Progress Notes (Signed)
This patient returns to my office for at risk foot care.  This patient requires this care by a professional since this patient will be at risk due to having diabetes.  This patient is unable to cut nails himself since the patient cannot reach his nails.These nails are painful walking and wearing shoes.  This patient presents for at risk foot care today.  General Appearance  Alert, conversant and in no acute stress.  Vascular  Dorsalis pedis and posterior tibial  pulses are palpable  bilaterally.  Capillary return is within normal limits  bilaterally. Temperature is within normal limits  bilaterally.  Neurologic  Senn-Weinstein monofilament wire test within normal limits  bilaterally. Muscle power within normal limits bilaterally.  Nails Thick disfigured discolored nails with subungual debris  Hallux nails bilaterally. No evidence of bacterial infection or drainage bilaterally.  Orthopedic  No limitations of motion  feet .  No crepitus or effusions noted.  No bony pathology or digital deformities noted.  Skin  normotropic skin with no porokeratosis noted bilaterally.  No signs of infections or ulcers noted.     Onychomycosis  Pain in right toes  Pain in left toes  Consent was obtained for treatment procedures.   Mechanical debridement of nails 1-5  bilaterally performed with a nail nipper.  Filed with dremel without incident.    Return office visit    4 months                  Told patient to return for periodic foot care and evaluation due to potential at risk complications.   Helane Gunther DPM

## 2024-04-20 ENCOUNTER — Ambulatory Visit: Admitting: Podiatry

## 2024-05-17 ENCOUNTER — Emergency Department (HOSPITAL_COMMUNITY)

## 2024-05-17 ENCOUNTER — Inpatient Hospital Stay (HOSPITAL_COMMUNITY)
Admission: EM | Admit: 2024-05-17 | Discharge: 2024-05-19 | Disposition: A | Discharge status: Skilled Nursing Facility | Source: Skilled Nursing Facility | Attending: Internal Medicine | Admitting: Internal Medicine

## 2024-05-17 ENCOUNTER — Encounter (HOSPITAL_COMMUNITY): Payer: Self-pay | Admitting: Emergency Medicine

## 2024-05-17 ENCOUNTER — Other Ambulatory Visit: Payer: Self-pay

## 2024-05-17 DIAGNOSIS — N390 Urinary tract infection, site not specified: Secondary | ICD-10-CM | POA: Diagnosis present

## 2024-05-17 DIAGNOSIS — Z79899 Other long term (current) drug therapy: Secondary | ICD-10-CM | POA: Diagnosis not present

## 2024-05-17 DIAGNOSIS — Z87891 Personal history of nicotine dependence: Secondary | ICD-10-CM

## 2024-05-17 DIAGNOSIS — Z79890 Hormone replacement therapy (postmenopausal): Secondary | ICD-10-CM

## 2024-05-17 DIAGNOSIS — J189 Pneumonia, unspecified organism: Secondary | ICD-10-CM | POA: Diagnosis present

## 2024-05-17 DIAGNOSIS — E119 Type 2 diabetes mellitus without complications: Secondary | ICD-10-CM

## 2024-05-17 DIAGNOSIS — I10 Essential (primary) hypertension: Secondary | ICD-10-CM | POA: Diagnosis not present

## 2024-05-17 DIAGNOSIS — E1165 Type 2 diabetes mellitus with hyperglycemia: Secondary | ICD-10-CM | POA: Diagnosis present

## 2024-05-17 DIAGNOSIS — E039 Hypothyroidism, unspecified: Secondary | ICD-10-CM | POA: Diagnosis present

## 2024-05-17 DIAGNOSIS — J449 Chronic obstructive pulmonary disease, unspecified: Secondary | ICD-10-CM | POA: Diagnosis not present

## 2024-05-17 DIAGNOSIS — I129 Hypertensive chronic kidney disease with stage 1 through stage 4 chronic kidney disease, or unspecified chronic kidney disease: Secondary | ICD-10-CM | POA: Diagnosis present

## 2024-05-17 DIAGNOSIS — Z8673 Personal history of transient ischemic attack (TIA), and cerebral infarction without residual deficits: Secondary | ICD-10-CM

## 2024-05-17 DIAGNOSIS — Z1152 Encounter for screening for COVID-19: Secondary | ICD-10-CM | POA: Diagnosis not present

## 2024-05-17 DIAGNOSIS — J440 Chronic obstructive pulmonary disease with acute lower respiratory infection: Secondary | ICD-10-CM | POA: Diagnosis present

## 2024-05-17 DIAGNOSIS — J9602 Acute respiratory failure with hypercapnia: Secondary | ICD-10-CM | POA: Diagnosis present

## 2024-05-17 DIAGNOSIS — J9601 Acute respiratory failure with hypoxia: Secondary | ICD-10-CM | POA: Diagnosis present

## 2024-05-17 DIAGNOSIS — F250 Schizoaffective disorder, bipolar type: Secondary | ICD-10-CM | POA: Diagnosis present

## 2024-05-17 DIAGNOSIS — E1122 Type 2 diabetes mellitus with diabetic chronic kidney disease: Secondary | ICD-10-CM | POA: Diagnosis present

## 2024-05-17 DIAGNOSIS — N1831 Chronic kidney disease, stage 3a: Secondary | ICD-10-CM | POA: Diagnosis present

## 2024-05-17 DIAGNOSIS — J181 Lobar pneumonia, unspecified organism: Principal | ICD-10-CM | POA: Diagnosis present

## 2024-05-17 DIAGNOSIS — Z91013 Allergy to seafood: Secondary | ICD-10-CM | POA: Diagnosis not present

## 2024-05-17 LAB — COMPREHENSIVE METABOLIC PANEL WITH GFR
ALT: 23 U/L (ref 0–44)
AST: 24 U/L (ref 15–41)
Albumin: 3.5 g/dL (ref 3.5–5.0)
Alkaline Phosphatase: 132 U/L — ABNORMAL HIGH (ref 38–126)
Anion gap: 12 (ref 5–15)
BUN: 32 mg/dL — ABNORMAL HIGH (ref 8–23)
CO2: 30 mmol/L (ref 22–32)
Calcium: 9.3 mg/dL (ref 8.9–10.3)
Chloride: 100 mmol/L (ref 98–111)
Creatinine, Ser: 1.25 mg/dL — ABNORMAL HIGH (ref 0.44–1.00)
GFR, Estimated: 48 mL/min — ABNORMAL LOW
Glucose, Bld: 204 mg/dL — ABNORMAL HIGH (ref 70–99)
Potassium: 4.1 mmol/L (ref 3.5–5.1)
Sodium: 141 mmol/L (ref 135–145)
Total Bilirubin: 0.2 mg/dL (ref 0.0–1.2)
Total Protein: 6.5 g/dL (ref 6.5–8.1)

## 2024-05-17 LAB — CBC WITH DIFFERENTIAL/PLATELET
Abs Immature Granulocytes: 0.04 K/uL (ref 0.00–0.07)
Basophils Absolute: 0 K/uL (ref 0.0–0.1)
Basophils Relative: 0 %
Eosinophils Absolute: 0.1 K/uL (ref 0.0–0.5)
Eosinophils Relative: 1 %
HCT: 35.8 % — ABNORMAL LOW (ref 36.0–46.0)
Hemoglobin: 11.4 g/dL — ABNORMAL LOW (ref 12.0–15.0)
Immature Granulocytes: 0 %
Lymphocytes Relative: 8 %
Lymphs Abs: 1 K/uL (ref 0.7–4.0)
MCH: 30.6 pg (ref 26.0–34.0)
MCHC: 31.8 g/dL (ref 30.0–36.0)
MCV: 96.2 fL (ref 80.0–100.0)
Monocytes Absolute: 0.8 K/uL (ref 0.1–1.0)
Monocytes Relative: 6 %
Neutro Abs: 10.2 K/uL — ABNORMAL HIGH (ref 1.7–7.7)
Neutrophils Relative %: 85 %
Platelets: 217 K/uL (ref 150–400)
RBC: 3.72 MIL/uL — ABNORMAL LOW (ref 3.87–5.11)
RDW: 15.3 % (ref 11.5–15.5)
WBC: 12.1 K/uL — ABNORMAL HIGH (ref 4.0–10.5)
nRBC: 0 % (ref 0.0–0.2)

## 2024-05-17 LAB — BLOOD GAS, VENOUS
Acid-Base Excess: 10.5 mmol/L — ABNORMAL HIGH (ref 0.0–2.0)
Bicarbonate: 38.1 mmol/L — ABNORMAL HIGH (ref 20.0–28.0)
Drawn by: 73776
O2 Saturation: 85.7 %
Patient temperature: 36.7
pCO2, Ven: 62 mmHg — ABNORMAL HIGH (ref 44–60)
pH, Ven: 7.39 (ref 7.25–7.43)
pO2, Ven: 50 mmHg — ABNORMAL HIGH (ref 32–45)

## 2024-05-17 LAB — RESP PANEL BY RT-PCR (RSV, FLU A&B, COVID)  RVPGX2
Influenza A by PCR: NEGATIVE
Influenza B by PCR: NEGATIVE
Resp Syncytial Virus by PCR: NEGATIVE
SARS Coronavirus 2 by RT PCR: NEGATIVE

## 2024-05-17 LAB — HEMOGLOBIN A1C
Hgb A1c MFr Bld: 9.7 % — ABNORMAL HIGH (ref 4.8–5.6)
Mean Plasma Glucose: 231.69 mg/dL

## 2024-05-17 LAB — GLUCOSE, CAPILLARY
Glucose-Capillary: 182 mg/dL — ABNORMAL HIGH (ref 70–99)
Glucose-Capillary: 229 mg/dL — ABNORMAL HIGH (ref 70–99)

## 2024-05-17 LAB — PRO BRAIN NATRIURETIC PEPTIDE: Pro Brain Natriuretic Peptide: 481 pg/mL — ABNORMAL HIGH

## 2024-05-17 MED ORDER — ENOXAPARIN SODIUM 40 MG/0.4ML IJ SOSY
40.0000 mg | PREFILLED_SYRINGE | INTRAMUSCULAR | Status: DC
  Administered 2024-05-17 – 2024-05-18 (×2): 40 mg via SUBCUTANEOUS
  Filled 2024-05-17 (×2): qty 0.4

## 2024-05-17 MED ORDER — DOXYCYCLINE HYCLATE 100 MG PO TABS
100.0000 mg | ORAL_TABLET | Freq: Once | ORAL | Status: AC
  Administered 2024-05-17: 100 mg via ORAL
  Filled 2024-05-17: qty 1

## 2024-05-17 MED ORDER — SODIUM CHLORIDE 0.9 % IV SOLN
1.0000 g | INTRAVENOUS | Status: DC
  Administered 2024-05-18: 1 g via INTRAVENOUS
  Filled 2024-05-17: qty 10

## 2024-05-17 MED ORDER — PROCHLORPERAZINE EDISYLATE 10 MG/2ML IJ SOLN: 10.0000 mg | INTRAMUSCULAR | Status: DC | PRN

## 2024-05-17 MED ORDER — GUAIFENESIN ER 600 MG PO TB12
600.0000 mg | ORAL_TABLET | Freq: Two times a day (BID) | ORAL | Status: DC
  Administered 2024-05-17 – 2024-05-19 (×5): 600 mg via ORAL
  Filled 2024-05-17 (×5): qty 1

## 2024-05-17 MED ORDER — DEXTROMETHORPHAN POLISTIREX ER 30 MG/5ML PO SUER: 30.0000 mg | Freq: Two times a day (BID) | ORAL | Status: DC | PRN

## 2024-05-17 MED ORDER — SODIUM CHLORIDE 0.9 % IV SOLN
1.0000 g | Freq: Once | INTRAVENOUS | Status: AC
  Administered 2024-05-17: 1 g via INTRAVENOUS
  Filled 2024-05-17: qty 10

## 2024-05-17 MED ORDER — OXYCODONE HCL 5 MG PO TABS: 5.0000 mg | ORAL_TABLET | ORAL | Status: DC | PRN

## 2024-05-17 MED ORDER — PANTOPRAZOLE SODIUM 40 MG PO TBEC
40.0000 mg | DELAYED_RELEASE_TABLET | Freq: Every evening | ORAL | Status: DC
  Administered 2024-05-17 – 2024-05-18 (×2): 40 mg via ORAL
  Filled 2024-05-17 (×2): qty 1

## 2024-05-17 MED ORDER — ACETAMINOPHEN 650 MG RE SUPP: 650.0000 mg | Freq: Four times a day (QID) | RECTAL | Status: DC | PRN

## 2024-05-17 MED ORDER — SODIUM CHLORIDE 0.9 % IV SOLN: INTRAVENOUS | Status: DC

## 2024-05-17 MED ORDER — DOXYCYCLINE HYCLATE 100 MG PO TABS
100.0000 mg | ORAL_TABLET | Freq: Two times a day (BID) | ORAL | Status: DC
  Administered 2024-05-17 – 2024-05-19 (×4): 100 mg via ORAL
  Filled 2024-05-17 (×4): qty 1

## 2024-05-17 MED ORDER — HALOPERIDOL LACTATE 5 MG/ML IJ SOLN: 2.0000 mg | Freq: Four times a day (QID) | INTRAMUSCULAR | Status: DC | PRN

## 2024-05-17 MED ORDER — ACETAMINOPHEN 325 MG PO TABS
650.0000 mg | ORAL_TABLET | Freq: Four times a day (QID) | ORAL | Status: DC | PRN
  Administered 2024-05-18: 650 mg via ORAL
  Filled 2024-05-17: qty 2

## 2024-05-17 MED ORDER — ALBUTEROL SULFATE (2.5 MG/3ML) 0.083% IN NEBU: 2.5000 mg | INHALATION_SOLUTION | RESPIRATORY_TRACT | Status: DC | PRN

## 2024-05-17 MED ORDER — IPRATROPIUM-ALBUTEROL 0.5-2.5 (3) MG/3ML IN SOLN
3.0000 mL | Freq: Once | RESPIRATORY_TRACT | Status: AC
  Administered 2024-05-17: 3 mL via RESPIRATORY_TRACT
  Filled 2024-05-17: qty 3

## 2024-05-17 MED ORDER — INSULIN ASPART 100 UNIT/ML IJ SOLN
0.0000 [IU] | Freq: Three times a day (TID) | INTRAMUSCULAR | Status: DC
  Administered 2024-05-17 – 2024-05-18 (×2): 3 [IU] via SUBCUTANEOUS
  Administered 2024-05-18: 5 [IU] via SUBCUTANEOUS
  Administered 2024-05-18 – 2024-05-19 (×3): 3 [IU] via SUBCUTANEOUS
  Filled 2024-05-17 (×6): qty 1

## 2024-05-17 MED ORDER — BISACODYL 5 MG PO TBEC
5.0000 mg | DELAYED_RELEASE_TABLET | Freq: Every day | ORAL | Status: DC | PRN
  Administered 2024-05-18: 5 mg via ORAL
  Filled 2024-05-17: qty 1

## 2024-05-17 MED ORDER — IPRATROPIUM-ALBUTEROL 0.5-2.5 (3) MG/3ML IN SOLN
3.0000 mL | Freq: Three times a day (TID) | RESPIRATORY_TRACT | Status: DC
  Administered 2024-05-17 – 2024-05-19 (×5): 3 mL via RESPIRATORY_TRACT
  Filled 2024-05-17 (×5): qty 3

## 2024-05-17 NOTE — Hospital Course (Signed)
 63 year old female with COPD, stage IIIa CKD, history of tobacco dependence, history of CVA, type 2 diabetes mellitus, schizoaffective disorder, tardive dyskinesia, hypothyroidism, hypertension, depression who was reportedly sent from group home reporting weakness and dark urine.  Caregivers reported they noticed urine had an odor.  EMS found the patient to be short of breath with a pulse ox of 81% on room air.  She was placed on supplemental oxygen.  Caretakers noted she has not been acting herself.  Patient has been having chest congestion nonproductive cough.  She was noted to have a white blood cell count of 12.1, hemoglobin 11, negative respiratory workup for influenza, RSV and coronavirus.  Her chest x-ray demonstrated a new left retrocardiac infiltrate.  She was started on IV antibiotics and supportive measures and admission was requested for further management.

## 2024-05-17 NOTE — ED Notes (Signed)
 Called Pts Legal Guardian, Chance Dempsey, and updated about admission to the hospital. Legal guardian aware and in understanding

## 2024-05-17 NOTE — ED Triage Notes (Signed)
 Pt endorses feeling weakness and dark urine. Caregiver told EMS urine had odor.  Pt endorses SOB, 81% on RA.

## 2024-05-17 NOTE — H&P (Signed)
 " History and Physical  [facility]  Carrie Clark FMW:[id number] DOB: 1960-09-14 DOA: 05/17/2024  PCP: Pcp, No  Patient coming from: Group Home  Level of care: Med-Surg  I have personally briefly reviewed patient's old medical records in [facility]  Chief Complaint: not acting self, SOB  Historian: pt only able to give limited history due to condition, information taken from ED records, EMS reports, unable to contact caretaker and no one present with patient to give further information  HPI: Carrie Clark is a 63 year old female with COPD, stage IIIa CKD, history of tobacco dependence, history of CVA, type 2 diabetes mellitus, schizoaffective disorder, tardive dyskinesia, hypothyroidism, hypertension, depression who was reportedly sent from group home reporting weakness and dark urine.  Caregivers reported they noticed urine had an odor.  EMS found the patient to be short of breath with a pulse ox of 81% on room air.  She was placed on supplemental oxygen.  Caretakers noted she has not been acting herself.  Patient has been having chest congestion nonproductive cough.  She was noted to have a white blood cell count of 12.1, hemoglobin 11, negative respiratory workup for influenza, RSV and coronavirus.  Her chest x-ray demonstrated a new left retrocardiac infiltrate.  She was started on IV antibiotics and supportive measures and admission was requested for further management.   Past Medical History:  Diagnosis Date   COPD (chronic obstructive pulmonary disease) (HCC)    CVA (cerebral infarction)    Depression    Hypertension    Hypothyroidism    Schizoaffective disorder, bipolar type (HCC)    Schizophrenia (HCC)    Stroke (HCC)    Tardive dyskinesia    Tardive dyskinesia     Past Surgical History:  Procedure Laterality Date   NO PAST SURGERIES       reports that she has never smoked. She has never used smokeless tobacco. She reports that she does not currently use alcohol.  She reports that she does not currently use drugs.  Allergies[1]  Family History  Family history unknown: Yes    Prior to Admission medications  Medication Sig Start Date End Date Taking? Authorizing Provider  albuterol  (VENTOLIN  HFA) 108 (90 Base) MCG/ACT inhaler Inhale 1-2 puffs into the lungs every 6 (six) hours as needed for wheezing or shortness of breath. 01/15/24   Beatty, Celeste A, PA-C  atorvastatin  (LIPITOR) 40 MG tablet Take 40 mg by mouth at bedtime.     [provider]  cetirizine  (ZYRTEC ) 10 MG tablet Take 1 tablet (10 mg total) by mouth daily. 11/11/14   Withrow, Norleen BROCKS, FNP  donepezil  (ARICEPT ) 10 MG tablet Take 10 mg by mouth at bedtime.    [provider]  DULoxetine  (CYMBALTA ) 30 MG capsule Take 1 capsule (30 mg total) by mouth daily. 11/17/19 05/27/27  Ernest Ronal BRAVO, MD  famotidine  (PEPCID ) 20 MG tablet Take 20 mg by mouth 2 (two) times daily.    [provider]  furosemide  (LASIX ) 20 MG tablet Take 1 tablet (20 mg total) by mouth daily. 11/11/14   Withrow, Norleen BROCKS, FNP  glycopyrrolate  (ROBINUL ) 1 MG tablet Take 1 mg by mouth 2 (two) times daily.    [provider]  levothyroxine  (SYNTHROID ) 100 MCG tablet Take 100 mcg by mouth daily before breakfast.    [provider]  OLANZapine  (ZYPREXA ) 2.5 MG tablet Take 2.5 mg by mouth 2 (two) times daily. 12/29/23   [provider]  Oxcarbazepine  (TRILEPTAL ) 300 MG  tablet Take 300 mg by mouth 2 (two) times daily. 12/28/20   [provider]  prazosin  (MINIPRESS ) 1 MG capsule Take 1 mg by mouth at bedtime. 12/28/20   [provider]  traZODone  (DESYREL ) 100 MG tablet Take 100 mg by mouth at bedtime. 12/29/23   [provider]  trihexyphenidyl (ARTANE) 2 MG tablet Take 2 mg by mouth at bedtime. 12/27/20   [provider]  VRAYLAR  4.5 MG CAPS Take 1 capsule by mouth daily.    [provider]  ziprasidone  (GEODON ) 80 MG capsule Take 80 mg by mouth at  bedtime.    [provider]  fluPHENAZine  (PROLIXIN ) 10 MG tablet Take 1 tablet (10 mg total) by mouth 3 (three) times daily. 12/03/18 11/17/19  Mody, Sital, MD  fluPHENAZine  decanoate (PROLIXIN ) 25 MG/ML injection Inject 50 mg into the muscle every 28 (twenty-eight) days.  11/17/19  [provider]  lurasidone  (LATUDA ) 20 MG TABS tablet Take 20 mg by mouth daily in the afternoon.   11/17/19  [provider]    Physical Exam: Vitals:   05/17/24 1130 05/17/24 1140 05/17/24 1345 05/17/24 1415  BP: 131/71  123/63   Pulse: 91  80 76  Resp: 20  18 19   Temp:      TempSrc:      SpO2: 94% 95% 94% 95%  Weight:       Constitutional: NAD, calm, comfortable Eyes: PERRL, lids and conjunctivae normal ENMT: Mucous membranes are moist. Posterior pharynx clear of any exudate or lesions. Edentulous Neck: normal, supple, no masses, no thyromegaly Respiratory: rales heard. Normal respiratory effort. No accessory muscle use.  Cardiovascular: normal s1, s2 sounds, no murmurs / rubs / gallops. No extremity edema. 2+ pedal pulses. No carotid bruits.  Abdomen: no tenderness, no masses palpated. No hepatosplenomegaly. Bowel sounds positive.  Musculoskeletal: no clubbing / cyanosis. No joint deformity upper and lower extremities. Good ROM, no contractures. Normal muscle tone.  Skin: no rashes, lesions, ulcers. No induration Neurologic: CN 2-12 grossly intact. Sensation intact, DTR normal. Strength 5/5 in all 4.  Psychiatric: compromised judgment and insight. Alert and oriented x 3. Normal mood.   Labs on Admission: I have personally reviewed following labs and imaging studies  CBC: Recent Labs  Lab 05/17/24 1101  WBC 12.1*  NEUTROABS 10.2*  HGB 11.4*  HCT 35.8*  MCV 96.2  PLT 217   Basic Metabolic Panel: Recent Labs  Lab 05/17/24 1101  NA 141  K 4.1  CL 100  CO2 30  GLUCOSE 204*  BUN 32*  CREATININE 1.25*  CALCIUM  9.3   GFR: Estimated Creatinine Clearance: 45.8  mL/min (A) (by C-G formula based on SCr of 1.25 mg/dL (H)). Liver Function Tests: Recent Labs  Lab 05/17/24 1101  AST 24  ALT 23  ALKPHOS 132*  BILITOT 0.2  PROT 6.5  ALBUMIN 3.5   No results for input(s): LIPASE, AMYLASE in the last 168 hours. No results for input(s): AMMONIA in the last 168 hours. Coagulation Profile: No results for input(s): INR, PROTIME in the last 168 hours. Cardiac Enzymes: No results for input(s): CKTOTAL, CKMB, CKMBINDEX, TROPONINI in the last 168 hours. BNP (last 3 results) Recent Labs    05/17/24 1101  PROBNP 481.0*   HbA1C: No results for input(s): HGBA1C in the last 72 hours. CBG: No results for input(s): GLUCAP in the last 168 hours. Lipid Profile: No results for input(s): CHOL, HDL, LDLCALC, TRIG, CHOLHDL, LDLDIRECT in the last 72 hours. Thyroid  Function Tests:  No results for input(s): TSH, T4TOTAL, FREET4, T3FREE, THYROIDAB in the last 72 hours. Anemia Panel: No results for input(s): VITAMINB12, FOLATE, FERRITIN, TIBC, IRON, RETICCTPCT in the last 72 hours. Urine analysis:    Component Value Date/Time   COLORURINE YELLOW 01/15/2024 1850   APPEARANCEUR CLEAR 01/15/2024 1850   LABSPEC 1.011 01/15/2024 1850   PHURINE 5.0 01/15/2024 1850   GLUCOSEU NEGATIVE 01/15/2024 1850   HGBUR NEGATIVE 01/15/2024 1850   BILIRUBINUR NEGATIVE 01/15/2024 1850   KETONESUR NEGATIVE 01/15/2024 1850   PROTEINUR NEGATIVE 01/15/2024 1850   UROBILINOGEN 0.2 12/09/2014 2020   NITRITE NEGATIVE 01/15/2024 1850   LEUKOCYTESUR SMALL (A) 01/15/2024 1850    Radiological Exams on Admission: US  Venous Img Lower Unilateral Left (DVT) Result Date: 05/17/2024 CLINICAL DATA:  edema.  SHORTNESS OF BREATH. EXAM: LEFT LOWER EXTREMITY VENOUS DOPPLER ULTRASOUND TECHNIQUE: Gray-scale sonography with compression, as well as color and duplex ultrasound, were performed to evaluate the deep venous system(s) from the level of  the common femoral vein through the popliteal and proximal calf veins. COMPARISON:  CHEST XR, CONCURRENT. FINDINGS: VENOUS Normal compressibility of the common femoral, superficial femoral, and popliteal veins, as well as the visualized calf veins. Visualized portions of profunda femoral vein and great saphenous vein unremarkable. No filling defects to suggest DVT on grayscale or color Doppler imaging. Doppler waveforms show normal direction of venous flow, normal respiratory plasticity and response to augmentation. Limited views of the contralateral common femoral vein are unremarkable. OTHER No evidence of superficial thrombophlebitis or abnormal fluid collection. Limitations: none IMPRESSION: No evidence of femoropopliteal DVT or superficial thrombophlebitis within the LEFT lower extremity. Electronically Signed   By: Thom Hall M.D.   On: 05/17/2024 12:58   DG Chest Port 1 View Result Date: 05/17/2024 CLINICAL DATA:  Shortness of breath. EXAM: PORTABLE CHEST 1 VIEW COMPARISON:  01/15/2024 FINDINGS: The heart size and mediastinal contours are within normal limits. Increased patchy opacities in left retrocardiac lung base, suspicious for infiltrate. No pleural effusion. IMPRESSION: Possible new left retrocardiac infiltrate. Continued chest radiographic follow-up recommended. Electronically Signed   By: Norleen DELENA Kil M.D.   On: 05/17/2024 11:43   EKG: Independently reviewed. NSR  Assessment/Plan Principal Problem:   CAP (community acquired pneumonia) Active Problems:   Schizoaffective disorder, bipolar type (HCC)   Hypothyroidism   HTN (hypertension)   COPD (chronic obstructive pulmonary disease) (HCC)   Diabetes (HCC)   Community acquired pneumonia   Community acquired pneumonia -- agree with IV ceftriaxone  and doxycycline  x 5 day course -- IV fluid  -- supplemental oxygen  -- unfortunately blood cultures were not collected in ED before they gave IV antibiotics -- mucinex  ordered --  albuterol  nebs ordered -- solumedrol 80 mg x 1   COPD  -- continue antibiotics and bronchodilators as ordered  Schizoaffective disorder, bipolar type -- resume home behavioral health meds as soon as reconciled by pharmacy  Type 2 diabetes mellitus -- SSI coverage ordered frequent CBG monitoring  Essential hypertension -- Blood pressure is controlled, resume home meds when reconciled  Hypothyroidism -- Resume home thyroid  supplement  CKD stage 3a -- stable, follow    DVT prophylaxis: enoxaparin   Code Status: Full   Family Communication: guardian notified of admission   Disposition Plan: return to group home   Consults called:   Admission status: IP Time spent: 68 mins  Level of care: Med-Surg Afton Louder MD [facility] How to contact the [facility] Attending or Consulting provider 7A - 7P or covering provider during  after hours 7P -7A, for this patient?  Check the care team in [facility] and look for a) attending/consulting [facility] provider listed and b) the TR[facility] team listed Log into [email or web address] and use [place]'s universal password to access. If you do not have the password, please contact the hospital operator. Locate the TR[facility] provider you are looking for under [facility] and page to a number that you can be directly reached. If you still have difficulty reaching the provider, please page the [facility] (Director on Call) for the Hospitalists listed on amion for assistance.   If 7PM-7AM, please contact night-coverage [email or web address] Password [email or web address]  05/17/2024, 2:17 PM        [1]  Allergies Allergen Reactions   Fish Allergy Other (See Comments)    Allergy reaction not listed- on MAR   "

## 2024-05-17 NOTE — ED Provider Notes (Signed)
 " [place] EMERGENCY DEPARTMENT AT [facility] Provider Note   CSN: [id number] Arrival date & time: 05/17/24  1042     Patient presents with: Shortness of Breath, Weakness, and Urinary Tract Infection   Carrie Clark is a 63 y.o. female who  has a past medical history of COPD (chronic obstructive pulmonary disease) (HCC), CVA (cerebral infarction), Depression, Hypertension, Hypothyroidism, Schizoaffective disorder, bipolar type (HCC), Schizophrenia (HCC), Stroke (HCC), Tardive dyskinesia, and Tardive dyskinesia.  Patient's speech is very difficult to understand which limits hx. Patient bib EMS for mulitple complaints. Patient caretaker called EMS for confusion, lethargy- patient  not acting herself.  Caretaker told ems urine is dark, foul smelling. EMS found patient hyposic on ra at 81%. She is now on j4 L [place]. No hx of resp failure. Patient c/o cough. Hx of smoking. Denies CP     Shortness of Breath Weakness Associated symptoms: shortness of breath   Urinary Tract Infection      Prior to Admission medications  Medication Sig Start Date End Date Taking? Authorizing Provider  albuterol  (VENTOLIN  HFA) 108 (90 Base) MCG/ACT inhaler Inhale 1-2 puffs into the lungs every 6 (six) hours as needed for wheezing or shortness of breath. 01/15/24   Suellen, Celeste A, PA-C  atorvastatin  (LIPITOR) 40 MG tablet Take 40 mg by mouth at bedtime.     [provider]  cetirizine  (ZYRTEC ) 10 MG tablet Take 1 tablet (10 mg total) by mouth daily. 11/11/14   Withrow, Norleen BROCKS, FNP  donepezil  (ARICEPT ) 10 MG tablet Take 10 mg by mouth at bedtime.    [provider]  DULoxetine  (CYMBALTA ) 30 MG capsule Take 1 capsule (30 mg total) by mouth daily. 11/17/19 05/27/27  Ernest Ronal BRAVO, MD  famotidine  (PEPCID ) 20 MG tablet Take 20 mg by mouth 2 (two) times daily.    [provider]  furosemide  (LASIX ) 20 MG tablet Take 1 tablet (20 mg total) by mouth daily. 11/11/14   Withrow, Norleen BROCKS,  FNP  glycopyrrolate  (ROBINUL ) 1 MG tablet Take 1 mg by mouth 2 (two) times daily.    [provider]  levothyroxine  (SYNTHROID ) 100 MCG tablet Take 100 mcg by mouth daily before breakfast.    [provider]  OLANZapine  (ZYPREXA ) 2.5 MG tablet Take 2.5 mg by mouth 2 (two) times daily. 12/29/23   [provider]  Oxcarbazepine  (TRILEPTAL ) 300 MG tablet Take 300 mg by mouth 2 (two) times daily. 12/28/20   [provider]  prazosin  (MINIPRESS ) 1 MG capsule Take 1 mg by mouth at bedtime. 12/28/20   [provider]  traZODone  (DESYREL ) 100 MG tablet Take 100 mg by mouth at bedtime. 12/29/23   [provider]  trihexyphenidyl (ARTANE) 2 MG tablet Take 2 mg by mouth at bedtime. 12/27/20   [provider]  VRAYLAR  4.5 MG CAPS Take 1 capsule by mouth daily.    [provider]  ziprasidone  (GEODON ) 80 MG capsule Take 80 mg by mouth at bedtime.    [provider]  fluPHENAZine  (PROLIXIN ) 10 MG tablet Take 1 tablet (10 mg total) by mouth 3 (three) times daily. 12/03/18 11/17/19  Mody, Sital, MD  fluPHENAZine  decanoate (PROLIXIN ) 25 MG/ML injection Inject 50 mg into the muscle every 28 (twenty-eight) days.  11/17/19  [provider]  lurasidone  (LATUDA ) 20 MG TABS tablet Take 20 mg by mouth daily in the afternoon.   11/17/19  [provider]    Allergies: Fish allergy    Review  of Systems  Respiratory:  Positive for shortness of breath.   Neurological:  Positive for weakness.    Updated Vital Signs BP 131/71   Pulse 91   Temp 97.8 F (36.6 C) (Oral)   Resp 20   Wt 73.5 kg   SpO2 95%   BMI 27.81 kg/m   Physical Exam Vitals and nursing note reviewed.  Constitutional:      General: She is not in acute distress.    Appearance: She is well-developed. She is not diaphoretic.  HENT:     Head: Normocephalic and atraumatic.     Comments: Very abnormal head position which rests directly on her chest due to severe  kyphosis    Right Ear: External ear normal.     Left Ear: External ear normal.     Nose: Nose normal.     Mouth/Throat:     Mouth: Mucous membranes are moist.  Eyes:     General: No scleral icterus.    Extraocular Movements: Extraocular movements intact.     Conjunctiva/sclera: Conjunctivae normal.     Pupils: Pupils are equal, round, and reactive to light.  Cardiovascular:     Rate and Rhythm: Normal rate and regular rhythm.     Heart sounds: Normal heart sounds. No murmur heard.    No friction rub. No gallop.  Pulmonary:     Effort: Pulmonary effort is normal. No respiratory distress.     Breath sounds: Rhonchi present.  Abdominal:     General: Bowel sounds are normal. There is no distension.     Palpations: Abdomen is soft. There is no mass.     Tenderness: There is no abdominal tenderness. There is no guarding.  Musculoskeletal:     Cervical back: Normal range of motion.  Skin:    General: Skin is warm and dry.  Neurological:     Mental Status: She is alert and oriented to person, place, and time.  Psychiatric:        Behavior: Behavior normal.     (all labs ordered are listed, but only abnormal results are displayed) Labs Reviewed  BLOOD GAS, VENOUS - Abnormal; Notable for the following components:      Result Value   pCO2, Ven 62 (*)    pO2, Ven 50 (*)    Bicarbonate 38.1 (*)    Acid-Base Excess 10.5 (*)    All other components within normal limits  RESP PANEL BY RT-PCR (RSV, FLU A&B, COVID)  [id number]  PRO BRAIN NATRIURETIC PEPTIDE  CBC WITH DIFFERENTIAL/PLATELET  COMPREHENSIVE METABOLIC PANEL WITH GFR    EKG: None  Radiology: [facility] Chest Port 1 View Result Date: 05/17/2024 CLINICAL DATA:  Shortness of breath. EXAM: PORTABLE CHEST 1 VIEW COMPARISON:  01/15/2024 FINDINGS: The heart size and mediastinal contours are within normal limits. Increased patchy opacities in left retrocardiac lung base, suspicious for infiltrate. No pleural effusion. IMPRESSION: Possible  new left retrocardiac infiltrate. Continued chest radiographic follow-up recommended. Electronically Signed   By: Norleen DELENA Kil M.D.   On: 05/17/2024 11:43     .Critical Care  Performed by: Arloa Chroman, PA-C Authorized by: Arloa Chroman, PA-C   Critical care provider statement:    Critical care time (minutes):  45   Critical care time was exclusive of:  Separately billable procedures and treating other patients   Critical care was necessary to treat or prevent imminent or life-threatening deterioration of the following conditions:  Respiratory failure   Critical care was time spent personally by me  on the following activities:  Development of treatment plan with patient or surrogate, discussions with consultants, evaluation of patient's response to treatment, examination of patient, interpretation of cardiac output measurements, obtaining history from patient or surrogate, ordering and performing treatments and interventions, ordering and review of laboratory studies, ordering and review of radiographic studies, re-evaluation of patient's condition and review of old charts    Medications Ordered in the ED  ipratropium-albuterol  (DUONEB) 0.5-2.5 (3) MG/3ML nebulizer solution 3 mL (3 mLs Nebulization Given 05/17/24 1121)    Clinical Course as of 05/17/24 1656  Mon May 17, 2024  1344 Resp panel by RT-PCR (RSV, Flu A&B, Covid) Anterior Nasal Swab [AH]    Clinical Course User Index [AH] Arloa Chroman, PA-C                                 Medical Decision Making This patient presents to the ED for concern of sob, this involves an extensive number of treatment options, and is a complaint that carries with it a high risk of complications and morbidity.  The emergent differential diagnosis for shortness of breath includes, but is not limited to, Pulmonary edema, bronchoconstriction, Pneumonia, Pulmonary embolism, Pneumotherax/ Hemothorax, Dysrythmia, ACS.     Co morbidities:         has a past medical history of COPD (chronic obstructive pulmonary disease) (HCC), CVA (cerebral infarction), Depression, Hypertension, Hypothyroidism, Schizoaffective disorder, bipolar type (HCC), Schizophrenia (HCC), Stroke (HCC), Tardive dyskinesia, and Tardive dyskinesia.   Social Determinants of Health:        SDOH Screenings Food Insecurity: No Food Insecurity (05/17/2024) Transportation Needs: No Transportation Needs (05/17/2024) Utilities: Not At Risk (05/17/2024) Tobacco Use: Low Risk (05/17/2024)   Additional history:  {Additional history obtained from ems, guardian   Lab Tests:  I Ordered, and personally interpreted labs.  The pertinent results include:   Labs reviewed blood glass shows mildly elevated hypercarbia, elevated oxygen compensated. Respiratory panel negative, BNP is only mildly elevated Patient has white blood cell count 12.1, mild anemia at 11.4 CMP with blood glucose 204, creatinine 1.25  Imaging Studies:  I ordered imaging studies including US  left LE, 1 v cxr I independently visualized and interpreted imaging which showed no thrombus and retrocardiac opacity likely cap I agree with the radiologist interpretation  Cardiac Monitoring/ECG:       The patient was maintained on a cardiac monitor.  I personally viewed and interpreted the cardiac monitored which showed an underlying rhythm of:   EKG Interpretation Date/Time:  Monday May 17 2024 11:16:54 EST Ventricular Rate:  93 PR Interval:  158 QRS Duration:  86 QT Interval:  357 QTC Calculation: 444 R Axis:   18  Text Interpretation: Sinus rhythm Probable anteroseptal infarct, old  Borderline T abnormalities, inferior leads Compared with prior EKG from  01/15/2024 Confirmed by Gennaro Bouchard ([id number]) on 05/17/2024 12:05:27 PM       Medicines ordered and prescription drug management:  I ordered medication including Medications insulin  aspart (novoLOG ) injection 0-15 Units (has no  administration in time range) enoxaparin  (LOVENOX ) injection 40 mg (has no administration in time range) 0.9 %  sodium chloride  infusion ( Intravenous New Bag/Given 05/17/24 1513) acetaminophen  (TYLENOL ) tablet 650 mg (has no administration in time range)   Or acetaminophen  (TYLENOL ) suppository 650 mg (has no administration in time range) oxyCODONE  (Oxy IR/ROXICODONE ) immediate release tablet 5 mg (has no administration in time range) haloperidol  lactate (HALDOL ) injection  2 mg (has no administration in time range) bisacodyl  (DULCOLAX) EC tablet 5 mg (has no administration in time range) prochlorperazine  (COMPAZINE ) injection 10 mg (has no administration in time range) pantoprazole  (PROTONIX ) EC tablet 40 mg (has no administration in time range) cefTRIAXone  (ROCEPHIN ) 1 g in sodium chloride  0.9 % 100 mL IVPB (has no administration in time range) doxycycline  (VIBRA -TABS) tablet 100 mg (has no administration in time range)  guaiFENesin  (MUCINEX ) 12 hr tablet 600 mg (has no administration in time range) dextromethorphan  (DELSYM ) 30 MG/5ML liquid 30 mg (has no administration in time range) ipratropium-albuterol  (DUONEB) 0.5-2.5 (3) MG/3ML nebulizer solution 3 mL (has no administration in time range) ipratropium-albuterol  (DUONEB) 0.5-2.5 (3) MG/3ML nebulizer solution 3 mL (3 mLs Nebulization Given 05/17/24 1121) cefTRIAXone  (ROCEPHIN ) 1 g in sodium chloride  0.9 % 100 mL IVPB (1 g Intravenous New Bag/Given 05/17/24 1349) doxycycline  (VIBRA -TABS) tablet 100 mg (100 mg Oral Given 05/17/24 1346) for pneumonia Reevaluation of the patient after these medicines showed that the patient improved I have reviewed the patients home medicines and have made adjustments as needed  Test Considered:       Considered CT imaging however her clinical picture fits pneumonia  Critical Interventions:       Fluids, off oxygen supplementation, treatment for pneumonia Consultations Obtained: Dr. Afton for  admission  Problem List / ED Course:       (J18.9) Pneumonia due to infectious organism, unspecified laterality, unspecified part of lung  (primary encounter diagnosis)  (J96.01,  J96.02) Acute respiratory failure with hypoxia and hypercapnia (HCC)   MDM: Patient here with lethargy acute respiratory failure in the setting of pneumonia.  Will need admission   Dispostion:  After consideration of the diagnostic results and the patients response to treatment, I feel that the patent would benefit from admission.    Amount and/or Complexity of Data Reviewed Independent Historian: guardian    Details: I discussed the case with patient's legal guardian. States that pateint's unintelligible speech is baseline.Aware of need for admission. No contact information for the patient's care taker for add'l hx  Labs: ordered. Decision-making details documented in ED Course. Radiology: ordered. Discussion of management or test interpretation with external provider(s): Dr. Afton for admission  Risk Prescription drug management. Decision regarding hospitalization.      Final diagnoses:  None    ED Discharge Orders     None          Arloa Chroman, PA-C 05/17/24 1717  "

## 2024-05-18 DIAGNOSIS — I10 Essential (primary) hypertension: Secondary | ICD-10-CM | POA: Diagnosis not present

## 2024-05-18 DIAGNOSIS — J449 Chronic obstructive pulmonary disease, unspecified: Secondary | ICD-10-CM | POA: Diagnosis not present

## 2024-05-18 DIAGNOSIS — E039 Hypothyroidism, unspecified: Secondary | ICD-10-CM | POA: Diagnosis not present

## 2024-05-18 DIAGNOSIS — J189 Pneumonia, unspecified organism: Secondary | ICD-10-CM | POA: Diagnosis not present

## 2024-05-18 LAB — BASIC METABOLIC PANEL WITH GFR
Anion gap: 13 (ref 5–15)
BUN: 26 mg/dL — ABNORMAL HIGH (ref 8–23)
CO2: 28 mmol/L (ref 22–32)
Calcium: 8.9 mg/dL (ref 8.9–10.3)
Chloride: 101 mmol/L (ref 98–111)
Creatinine, Ser: 1.09 mg/dL — ABNORMAL HIGH (ref 0.44–1.00)
GFR, Estimated: 57 mL/min — ABNORMAL LOW
Glucose, Bld: 174 mg/dL — ABNORMAL HIGH (ref 70–99)
Potassium: 3.8 mmol/L (ref 3.5–5.1)
Sodium: 141 mmol/L (ref 135–145)

## 2024-05-18 LAB — CBC WITH DIFFERENTIAL/PLATELET
Abs Immature Granulocytes: 0.04 K/uL (ref 0.00–0.07)
Basophils Absolute: 0 K/uL (ref 0.0–0.1)
Basophils Relative: 0 %
Eosinophils Absolute: 0.2 K/uL (ref 0.0–0.5)
Eosinophils Relative: 2 %
HCT: 32.3 % — ABNORMAL LOW (ref 36.0–46.0)
Hemoglobin: 10.3 g/dL — ABNORMAL LOW (ref 12.0–15.0)
Immature Granulocytes: 0 %
Lymphocytes Relative: 20 %
Lymphs Abs: 2.2 K/uL (ref 0.7–4.0)
MCH: 30.2 pg (ref 26.0–34.0)
MCHC: 31.9 g/dL (ref 30.0–36.0)
MCV: 94.7 fL (ref 80.0–100.0)
Monocytes Absolute: 0.7 K/uL (ref 0.1–1.0)
Monocytes Relative: 6 %
Neutro Abs: 8 K/uL — ABNORMAL HIGH (ref 1.7–7.7)
Neutrophils Relative %: 72 %
Platelets: 199 K/uL (ref 150–400)
RBC: 3.41 MIL/uL — ABNORMAL LOW (ref 3.87–5.11)
RDW: 15.6 % — ABNORMAL HIGH (ref 11.5–15.5)
WBC: 11.1 K/uL — ABNORMAL HIGH (ref 4.0–10.5)
nRBC: 0 % (ref 0.0–0.2)

## 2024-05-18 LAB — GLUCOSE, CAPILLARY
Glucose-Capillary: 166 mg/dL — ABNORMAL HIGH (ref 70–99)
Glucose-Capillary: 175 mg/dL — ABNORMAL HIGH (ref 70–99)
Glucose-Capillary: 208 mg/dL — ABNORMAL HIGH (ref 70–99)
Glucose-Capillary: 120 mg/dL — ABNORMAL HIGH (ref 70–99)

## 2024-05-18 LAB — HIV ANTIBODY (ROUTINE TESTING W REFLEX): HIV Screen 4th Generation wRfx: NONREACTIVE

## 2024-05-18 MED ORDER — POTASSIUM CHLORIDE CRYS ER 20 MEQ PO TBCR
20.0000 meq | EXTENDED_RELEASE_TABLET | Freq: Every day | ORAL | Status: DC
  Administered 2024-05-18 – 2024-05-19 (×2): 20 meq via ORAL
  Filled 2024-05-18: qty 2
  Filled 2024-05-18: qty 1

## 2024-05-18 MED ORDER — LEVOCETIRIZINE DIHYDROCHLORIDE 5 MG PO TABS: 5.0000 mg | ORAL_TABLET | Freq: Every day | ORAL | Status: DC

## 2024-05-18 MED ORDER — GLYCOPYRROLATE 1 MG PO TABS
1.0000 mg | ORAL_TABLET | Freq: Two times a day (BID) | ORAL | Status: DC
  Administered 2024-05-18 – 2024-05-19 (×3): 1 mg via ORAL
  Filled 2024-05-18 (×3): qty 1

## 2024-05-18 MED ORDER — FAMOTIDINE 20 MG PO TABS
20.0000 mg | ORAL_TABLET | Freq: Two times a day (BID) | ORAL | Status: DC
  Administered 2024-05-18 – 2024-05-19 (×2): 20 mg via ORAL
  Filled 2024-05-18 (×2): qty 1

## 2024-05-18 MED ORDER — LEVOTHYROXINE SODIUM 100 MCG PO TABS
100.0000 ug | ORAL_TABLET | Freq: Every day | ORAL | Status: DC
  Administered 2024-05-19: 100 ug via ORAL
  Filled 2024-05-18: qty 1

## 2024-05-18 MED ORDER — DONEPEZIL HCL 5 MG PO TABS
10.0000 mg | ORAL_TABLET | Freq: Every day | ORAL | Status: DC
  Administered 2024-05-18: 10 mg via ORAL
  Filled 2024-05-18: qty 2

## 2024-05-18 MED ORDER — INSULIN GLARGINE 100 UNIT/ML ~~LOC~~ SOLN
12.0000 [IU] | Freq: Every day | SUBCUTANEOUS | Status: DC
  Administered 2024-05-18 – 2024-05-19 (×2): 12 [IU] via SUBCUTANEOUS
  Filled 2024-05-18 (×3): qty 0.12

## 2024-05-18 MED ORDER — SODIUM CHLORIDE 0.9 % IV SOLN: INTRAVENOUS | Status: AC

## 2024-05-18 MED ORDER — DIAZEPAM 5 MG PO TABS
10.0000 mg | ORAL_TABLET | Freq: Two times a day (BID) | ORAL | Status: DC
  Administered 2024-05-18 – 2024-05-19 (×3): 10 mg via ORAL
  Filled 2024-05-18 (×3): qty 2

## 2024-05-18 MED ORDER — HYDRALAZINE HCL 20 MG/ML IJ SOLN: 10.0000 mg | INTRAMUSCULAR | Status: DC | PRN

## 2024-05-18 MED ORDER — LORATADINE 10 MG PO TABS
10.0000 mg | ORAL_TABLET | Freq: Every day | ORAL | Status: DC
  Administered 2024-05-19: 10 mg via ORAL
  Filled 2024-05-18: qty 1

## 2024-05-18 MED ORDER — TRAZODONE HCL 50 MG PO TABS
100.0000 mg | ORAL_TABLET | Freq: Every day | ORAL | Status: DC
  Administered 2024-05-18: 100 mg via ORAL
  Filled 2024-05-18: qty 2

## 2024-05-18 MED ORDER — PRAZOSIN HCL 1 MG PO CAPS
1.0000 mg | ORAL_CAPSULE | Freq: Every day | ORAL | Status: DC
  Administered 2024-05-18: 1 mg via ORAL
  Filled 2024-05-18 (×2): qty 1

## 2024-05-18 MED ORDER — DULOXETINE HCL 30 MG PO CPEP
30.0000 mg | ORAL_CAPSULE | Freq: Every day | ORAL | Status: DC
  Administered 2024-05-18 – 2024-05-19 (×2): 30 mg via ORAL
  Filled 2024-05-18 (×2): qty 1

## 2024-05-18 MED ORDER — OLANZAPINE 5 MG PO TABS
2.5000 mg | ORAL_TABLET | Freq: Two times a day (BID) | ORAL | Status: DC
  Administered 2024-05-18 – 2024-05-19 (×3): 2.5 mg via ORAL
  Filled 2024-05-18 (×3): qty 1

## 2024-05-18 MED ORDER — ATORVASTATIN CALCIUM 40 MG PO TABS
40.0000 mg | ORAL_TABLET | Freq: Every day | ORAL | Status: DC
  Administered 2024-05-18: 40 mg via ORAL
  Filled 2024-05-18: qty 1

## 2024-05-18 NOTE — Progress Notes (Signed)
 " PROGRESS NOTE   Carrie Clark  FMW:[id number] DOB: [birth year] DOA: 05/17/2024 PCP: Pcp, No   Chief Complaint  Patient presents with   Shortness of Breath   Weakness   Urinary Tract Infection   Level of care: Med-Surg  Brief Admission History:  64 year old female with COPD, stage IIIa CKD, history of tobacco dependence, history of CVA, type 2 diabetes mellitus, schizoaffective disorder, tardive dyskinesia, hypothyroidism, hypertension, depression who was reportedly sent from group home reporting weakness and dark urine.  Caregivers reported they noticed urine had an odor.  EMS found the patient to be short of breath with a pulse ox of 81% on room air.  She was placed on supplemental oxygen.  Caretakers noted she has not been acting herself.  Patient has been having chest congestion nonproductive cough.  She was noted to have a white blood cell count of 12.1, hemoglobin 11, negative respiratory workup for influenza, RSV and coronavirus.  Her chest x-ray demonstrated a new left retrocardiac infiltrate.  She was started on IV antibiotics and supportive measures and admission was requested for further management.   Assessment and Plan:  Community acquired pneumonia-- IMPROVING -- agree with IV ceftriaxone  and doxycycline  x 5 days -- IV fluid  -- supplemental oxygen  -- unfortunately blood cultures were not collected in ED before they gave IV antibiotics -- mucinex  ordered -- albuterol  nebs ordered -- if continues to improve likely return to group home tomorrow   COPD  -- continue antibiotics and bronchodilators as ordered   Schizoaffective disorder, bipolar type -- resume home behavioral health meds as soon as reconciled by pharmacy   Type 2 diabetes mellitus -- SSI coverage ordered frequent CBG monitoring  CBG (last 3)  Recent Labs    05/18/24 0725 05/18/24 1113 05/18/24 1552  GLUCAP 166* 175* 208*   Essential hypertension -- Blood pressure is controlled, resume home meds    Hypothyroidism -- Resume home thyroid  supplement   CKD stage 3a -- stable, follow    DVT prophylaxis: enoxaparin  Code Status: Full  Family Communication:  Disposition:    Consultants:   Procedures:   Antimicrobials:  Ceftriaxone  12/22> Azithromycin  12/22>>   Subjective: No specific complaints  Objective: Vitals:   05/18/24 0405 05/18/24 0830 05/18/24 1225 05/18/24 1443  BP: (!) 153/75  (!) 152/73   Pulse: 92  89   Resp: 18     Temp: 99.9 F (37.7 C)  99.6 F (37.6 C)   TempSrc: Oral  Oral   SpO2: 93% (!) 85% 92% 92%  Weight:        Intake/Output Summary (Last 24 hours) at 05/18/2024 1652 Last data filed at 05/18/2024 1527 Gross per 24 hour  Intake 883.32 ml  Output 1450 ml  Net -566.68 ml   Filed Weights   05/17/24 1054 05/17/24 1502  Weight: 73.5 kg 98.3 kg   Examination:  General exam: Appears calm and comfortable  Respiratory system: improved rales, better air movements. Cardiovascular system: normal S1 & S2 heard. No JVD, murmurs, rubs, gallops or clicks. No pedal edema. Gastrointestinal system: Abdomen is nondistended, soft and nontender. No organomegaly or masses felt. Normal bowel sounds heard. Central nervous system: Alert and oriented. No focal neurological deficits. Extremities: Symmetric 5 x 5 power. Skin: No rashes, lesions or ulcers. Psychiatry: Judgement and insight UTD. Mood & affect flat.   Data Reviewed: I have personally reviewed following labs and imaging studies  CBC: Recent Labs  Lab 05/17/24 1101 05/18/24 0420  WBC 12.1* 11.1*  NEUTROABS  10.2* 8.0*  HGB 11.4* 10.3*  HCT 35.8* 32.3*  MCV 96.2 94.7  PLT 217 199    Basic Metabolic Panel: Recent Labs  Lab 05/17/24 1101 05/18/24 0420  NA 141 141  K 4.1 3.8  CL 100 101  CO2 30 28  GLUCOSE 204* 174*  BUN 32* 26*  CREATININE 1.25* 1.09*  CALCIUM  9.3 8.9    CBG: Recent Labs  Lab 05/17/24 1606 05/17/24 1953 05/18/24 0725 05/18/24 1113 05/18/24 1552  GLUCAP  182* 229* 166* 175* 208*    Recent Results (from the past 240 hours)  Resp panel by RT-PCR (RSV, Flu A&B, Covid) Anterior Nasal Swab     Status: None   Collection Time: 05/17/24 11:22 AM   Specimen: Anterior Nasal Swab  Result Value Ref Range Status   SARS Coronavirus 2 by RT PCR NEGATIVE NEGATIVE Final    Comment: (NOTE) SARS-CoV-2 target nucleic acids are NOT DETECTED.  The SARS-CoV-2 RNA is generally detectable in upper respiratory specimens during the acute phase of infection. The lowest concentration of SARS-CoV-2 viral copies this assay can detect is 138 copies/mL. A negative result does not preclude SARS-Cov-2 infection and should not be used as the sole basis for treatment or other patient management decisions. A negative result may occur with  improper specimen collection/handling, submission of specimen other than nasopharyngeal swab, presence of viral mutation(s) within the areas targeted by this assay, and inadequate number of viral copies(<138 copies/mL). A negative result must be combined with clinical observations, patient history, and epidemiological information. The expected result is Negative.  Fact Sheet for Patients:  bloggercourse.com  Fact Sheet for Healthcare Providers:  seriousbroker.it  This test is no t yet approved or cleared by the [place]  FDA and  has been authorized for detection and/or diagnosis of SARS-CoV-2 by FDA under an Emergency Use Authorization (EUA). This EUA will remain  in effect (meaning this test can be used) for the duration of the COVID-19 declaration under Section 564(b)(1) of the Act, 21 U.S.C.section 360bbb-3(b)(1), unless the authorization is terminated  or revoked sooner.       Influenza A by PCR NEGATIVE NEGATIVE Final   Influenza B by PCR NEGATIVE NEGATIVE Final    Comment: (NOTE) The Xpert Xpress SARS-CoV-2/FLU/RSV plus assay is intended as an aid in the diagnosis  of influenza from Nasopharyngeal swab specimens and should not be used as a sole basis for treatment. Nasal washings and aspirates are unacceptable for Xpert Xpress SARS-CoV-2/FLU/RSV testing.  Fact Sheet for Patients: bloggercourse.com  Fact Sheet for Healthcare Providers: seriousbroker.it  This test is not yet approved or cleared by the [place]  FDA and has been authorized for detection and/or diagnosis of SARS-CoV-2 by FDA under an Emergency Use Authorization (EUA). This EUA will remain in effect (meaning this test can be used) for the duration of the COVID-19 declaration under Section 564(b)(1) of the Act, 21 U.S.C. section 360bbb-3(b)(1), unless the authorization is terminated or revoked.     Resp Syncytial Virus by PCR NEGATIVE NEGATIVE Final    Comment: (NOTE) Fact Sheet for Patients: bloggercourse.com  Fact Sheet for Healthcare Providers: seriousbroker.it  This test is not yet approved or cleared by the [place]  FDA and has been authorized for detection and/or diagnosis of SARS-CoV-2 by FDA under an Emergency Use Authorization (EUA). This EUA will remain in effect (meaning this test can be used) for the duration of the COVID-19 declaration under Section 564(b)(1) of the Act, 21 U.S.C. section 360bbb-3(b)(1), unless the authorization  is terminated or revoked.  Performed at [facility], [address], [place], [place] [address]      Radiology Studies: US  Venous Img Lower Unilateral Left (DVT) Result Date: 05/17/2024 CLINICAL DATA:  edema.  SHORTNESS OF BREATH. EXAM: LEFT LOWER EXTREMITY VENOUS DOPPLER ULTRASOUND TECHNIQUE: Gray-scale sonography with compression, as well as color and duplex ultrasound, were performed to evaluate the deep venous system(s) from the level of the common femoral vein through the popliteal and proximal calf veins. COMPARISON:  CHEST  XR, CONCURRENT. FINDINGS: VENOUS Normal compressibility of the common femoral, superficial femoral, and popliteal veins, as well as the visualized calf veins. Visualized portions of profunda femoral vein and great saphenous vein unremarkable. No filling defects to suggest DVT on grayscale or color Doppler imaging. Doppler waveforms show normal direction of venous flow, normal respiratory plasticity and response to augmentation. Limited views of the contralateral common femoral vein are unremarkable. OTHER No evidence of superficial thrombophlebitis or abnormal fluid collection. Limitations: none IMPRESSION: No evidence of femoropopliteal DVT or superficial thrombophlebitis within the LEFT lower extremity. Electronically Signed   By: Thom Hall M.D.   On: 05/17/2024 12:58   DG Chest Port 1 View Result Date: 05/17/2024 CLINICAL DATA:  Shortness of breath. EXAM: PORTABLE CHEST 1 VIEW COMPARISON:  01/15/2024 FINDINGS: The heart size and mediastinal contours are within normal limits. Increased patchy opacities in left retrocardiac lung base, suspicious for infiltrate. No pleural effusion. IMPRESSION: Possible new left retrocardiac infiltrate. Continued chest radiographic follow-up recommended. Electronically Signed   By: Norleen DELENA Kil M.D.   On: 05/17/2024 11:43    Scheduled Meds:  atorvastatin   40 mg Oral QHS   diazepam   10 mg Oral BID   donepezil   10 mg Oral QHS   doxycycline   100 mg Oral Q12H   DULoxetine   30 mg Oral Daily   enoxaparin  (LOVENOX ) injection  40 mg Subcutaneous Q24H   famotidine   20 mg Oral BID   glycopyrrolate   1 mg Oral BID   guaiFENesin   600 mg Oral BID   insulin  aspart  0-15 Units Subcutaneous TID WC   insulin  glargine  12 Units Subcutaneous Daily   ipratropium-albuterol   3 mL Nebulization TID   [START ON 05/19/2024] levothyroxine   100 mcg Oral QAC breakfast   [START ON 05/19/2024] loratadine   10 mg Oral Daily   OLANZapine   2.5 mg Oral BID   pantoprazole   40 mg Oral QPM    potassium chloride  SA  20 mEq Oral Daily   prazosin   1 mg Oral QHS   traZODone   100 mg Oral QHS   Continuous Infusions:  sodium chloride  40 mL/hr at 05/18/24 1527   cefTRIAXone  (ROCEPHIN )  IV Stopped (05/18/24 1213)     LOS: 1 day   Time spent: 57 mins  Angeletta Goelz Vicci, MD How to contact the [facility] Attending or Consulting provider 7A - 7P or covering provider during after hours 7P -7A, for this patient?  Check the care team in [facility] and look for a) attending/consulting TRH provider listed and b) the TRH team listed Log into [email or web address] to find provider on call.  Locate the TRH provider you are looking for under [facility] and page to a number that you can be directly reached. If you still have difficulty reaching the provider, please page the [facility] (Director on Call) for the Hospitalists listed on amion for assistance.  05/18/2024, 4:52 PM    "

## 2024-05-18 NOTE — TOC Initial Note (Signed)
 Transition of Care [facility]) - Initial/Assessment Note    Patient Details  Name: Carrie Clark MRN: [id number] Date of Birth: July 01, 1960  [facility] CM/SW Contact:    Lucie Lunger, LCSWA Phone Number: 05/18/2024, 10:06 AM  Clinical Narrative:                 CSW notes pt arrived to hospital from a facility. CSW spoke to [place] who confirms pt is a resident at [facility]). Pt is able to ambulate independently but has a walker to use if needed. Pt has assistance with bathing and dressing. Plan is for return once medically stable. TOC to follow.   Expected Discharge Plan: Group Home Barriers to Discharge: Continued Medical Work up   Patient Goals and CMS Choice Patient states their goals for this hospitalization and ongoing recovery are:: return home CMS Medicare.gov Compare Post Acute Care list provided to:: Patient Represenative (must comment) Choice offered to / list presented to : [facility] POA / Guardian      Expected Discharge Plan and Services In-house Referral: Clinical Social Work Discharge Planning Services: CM Consult   Living arrangements for the past 2 months: Group Home                                      Prior Living Arrangements/Services Living arrangements for the past 2 months: Group Home Lives with:: Facility Resident Patient language and need for interpreter reviewed:: Yes Do you feel safe going back to the place where you live?: Yes      Need for Family Participation in Patient Care: Yes (Comment) Care giver support system in place?: Yes (comment) Current home services: DME Criminal Activity/Legal Involvement Pertinent to Current Situation/Hospitalization: No - Comment as needed  Activities of Daily Living   ADL Screening (condition at time of admission) Independently performs ADLs?: No Is the patient deaf or have difficulty hearing?: No Does the patient have difficulty seeing, even when wearing  glasses/contacts?: No Does the patient have difficulty concentrating, remembering, or making decisions?: Yes  Permission Sought/Granted                  Emotional Assessment Appearance:: Appears stated age     Orientation: : Oriented to Self Alcohol / Substance Use: Not Applicable Psych Involvement: No (comment)  Admission diagnosis:  Community acquired pneumonia [J18.9] Acute respiratory failure with hypoxia and hypercapnia (HCC) [J96.01, J96.02] Pneumonia due to infectious organism, unspecified laterality, unspecified part of lung [J18.9] Patient Active Problem List   Diagnosis Date Noted   CAP (community acquired pneumonia) 05/17/2024   Community acquired pneumonia 05/17/2024   Pain due to onychomycosis of toenails of both feet 10/18/2020   UTI (urinary tract infection) 12/03/2018   Hypothyroidism 12/03/2018   HTN (hypertension) 12/03/2018   COPD (chronic obstructive pulmonary disease) (HCC) 12/03/2018   Diabetes (HCC) 12/03/2018   Altered mental status 12/03/2018   Schizoaffective disorder, bipolar type (HCC) 09/11/2015   Aggression    Episode of behavior change    Psychoses (HCC)    PCP:  Pcp, No Pharmacy:   [facility], [place] - [address] [place] [address] Phone: [phone] Fax: [phone]     Social Drivers of Health (SDOH) Social History: SDOH Screenings   Food Insecurity: No Food Insecurity (05/17/2024)  Transportation Needs: No Transportation Needs (05/17/2024)  Utilities: Not At Risk (05/17/2024)  Tobacco Use: Low Risk (05/17/2024)   SDOH Interventions:     Readmission Risk Interventions    05/18/2024   10:05 AM  Readmission Risk Prevention Plan  Transportation Screening Complete  Home Care Screening Complete  Medication Review (RN CM) Complete

## 2024-05-18 NOTE — Progress Notes (Signed)
 Spoke to [employer] [phone]), manager at [facility] group home to request list of medications. Communicated that she would fax the list to the hospital.

## 2024-05-18 NOTE — Plan of Care (Signed)

## 2024-05-18 NOTE — Plan of Care (Signed)
  Problem: Clinical Measurements: Goal: Diagnostic test results will improve Outcome: Progressing Goal: Respiratory complications will improve Outcome: Progressing Goal: Cardiovascular complication will be avoided Outcome: Progressing   Problem: Education: Goal: Knowledge of General Education information will improve Description: Including pain rating scale, medication(s)/side effects and non-pharmacologic comfort measures Outcome: Not Progressing   Problem: Health Behavior/Discharge Planning: Goal: Ability to manage health-related needs will improve Outcome: Not Progressing   Problem: Clinical Measurements: Goal: Ability to maintain clinical measurements within normal limits will improve Outcome: Not Progressing Goal: Will remain free from infection Outcome: Not Progressing   

## 2024-05-19 DIAGNOSIS — J181 Lobar pneumonia, unspecified organism: Secondary | ICD-10-CM | POA: Diagnosis not present

## 2024-05-19 DIAGNOSIS — F250 Schizoaffective disorder, bipolar type: Secondary | ICD-10-CM | POA: Diagnosis not present

## 2024-05-19 DIAGNOSIS — J9601 Acute respiratory failure with hypoxia: Secondary | ICD-10-CM | POA: Diagnosis not present

## 2024-05-19 LAB — CBC WITH DIFFERENTIAL/PLATELET
Abs Immature Granulocytes: 0.04 K/uL (ref 0.00–0.07)
Basophils Absolute: 0 K/uL (ref 0.0–0.1)
Basophils Relative: 0 %
Eosinophils Absolute: 0.2 K/uL (ref 0.0–0.5)
Eosinophils Relative: 2 %
HCT: 32.2 % — ABNORMAL LOW (ref 36.0–46.0)
Hemoglobin: 10.1 g/dL — ABNORMAL LOW (ref 12.0–15.0)
Immature Granulocytes: 1 %
Lymphocytes Relative: 27 %
Lymphs Abs: 2.3 K/uL (ref 0.7–4.0)
MCH: 30.5 pg (ref 26.0–34.0)
MCHC: 31.4 g/dL (ref 30.0–36.0)
MCV: 97.3 fL (ref 80.0–100.0)
Monocytes Absolute: 0.8 K/uL (ref 0.1–1.0)
Monocytes Relative: 9 %
Neutro Abs: 5.2 K/uL (ref 1.7–7.7)
Neutrophils Relative %: 61 %
Platelets: 180 K/uL (ref 150–400)
RBC: 3.31 MIL/uL — ABNORMAL LOW (ref 3.87–5.11)
RDW: 16.1 % — ABNORMAL HIGH (ref 11.5–15.5)
WBC: 8.5 K/uL (ref 4.0–10.5)
nRBC: 0 % (ref 0.0–0.2)

## 2024-05-19 LAB — GLUCOSE, CAPILLARY
Glucose-Capillary: 156 mg/dL — ABNORMAL HIGH (ref 70–99)
Glucose-Capillary: 183 mg/dL — ABNORMAL HIGH (ref 70–99)

## 2024-05-19 MED ORDER — IPRATROPIUM-ALBUTEROL 0.5-2.5 (3) MG/3ML IN SOLN: 3.0000 mL | Freq: Two times a day (BID) | RESPIRATORY_TRACT | Status: DC

## 2024-05-19 MED ORDER — AMOXICILLIN-POT CLAVULANATE 875-125 MG PO TABS
1.0000 | ORAL_TABLET | Freq: Two times a day (BID) | ORAL | Status: DC
  Administered 2024-05-19: 1 via ORAL
  Filled 2024-05-19: qty 1

## 2024-05-19 MED ORDER — CEFDINIR 300 MG PO CAPS: 300.0000 mg | ORAL_CAPSULE | Freq: Two times a day (BID) | ORAL | 0 refills | Status: DC

## 2024-05-19 MED ORDER — DOXYCYCLINE HYCLATE 100 MG PO TABS: 100.0000 mg | ORAL_TABLET | Freq: Two times a day (BID) | ORAL | 0 refills | Status: DC

## 2024-05-19 NOTE — Discharge Summary (Signed)
 " Physician Discharge Summary   Patient: Carrie Clark MRN: [id number] DOB: Nov 23, 1960  Admit date:     05/17/2024  Discharge date: 05/19/2024  Discharge Physician: Alm Turquoise Esch   PCP: Pcp, No   Recommendations at discharge:   Please follow up with primary care provider within 1-2 weeks  Please repeat BMP and CBC in one week     Hospital Course: 64 year old female with COPD, stage IIIa CKD, history of tobacco dependence, history of CVA, type 2 diabetes mellitus, schizoaffective disorder, tardive dyskinesia, hypothyroidism, hypertension, depression who was reportedly sent from group home reporting weakness and dark urine.  Caregivers reported they noticed urine had an odor.  EMS found the patient to be short of breath with a pulse ox of 81% on room air.  She was placed on supplemental oxygen.  Caretakers noted she has not been acting herself.  Patient has been having chest congestion nonproductive cough.  She was noted to have a white blood cell count of 12.1, hemoglobin 11, negative respiratory workup for influenza, RSV and coronavirus.  Her chest x-ray demonstrated a new left retrocardiac infiltrate.  She was started on IV antibiotics and supportive measures and admission was requested for further management.  Assessment and Plan: Community acquired pneumonia/Lobar pneumonia -- continued IV ceftriaxone  and doxycycline  -- IV fluids given  -- supplemental oxygen  -- unfortunately blood cultures were not collected in ED before they gave IV antibiotics -- mucinex  ordered -- albuterol  nebs ordered -- d/c to ALF with cefdinir  and doxy x 4 more days  Acute respiratory failure with hypoxia -presented with sob and oxygen sat in 80s on RA -stable on 2L -ambulated on day of d/c--desat to 86% on RA>>d/c to ALF with 2L -follow up with PCP in 1-2 weeks to see if she continues to need oxygen   COPD  -- continue antibiotics and bronchodilators as ordered   Schizoaffective disorder, bipolar type --  resume home behavioral health meds as soon as reconciled by pharmacy   Type 2 diabetes mellitus, uncontrolled with hyperglycemia -- SSI coverage ordered frequent CBG monitoring --05/16/24 A1C--9.7  Essential hypertension -- Blood pressure is controlled, resume home meds--minipress    Hypothyroidism -- Resume home thyroid  supplement   CKD stage 3a -- stable, -baseline creatinine 1.0-1.3         Consultants: none Procedures performed: none  Disposition: Home Diet recommendation:  Carb modified diet DISCHARGE MEDICATION: Allergies as of 05/19/2024       Reactions   Fish Allergy Other (See Comments)   Allergy reaction not listed- on MAR        Medication List     TAKE these medications    trihexyphenidyl 2 MG tablet Commonly known as: ARTANE Take 2 mg by mouth at bedtime. The timing of this medication is very important.   acetaminophen  325 MG tablet Commonly known as: TYLENOL  Take 650 mg by mouth every 6 (six) hours as needed for moderate pain (pain score 4-6).   albuterol  108 (90 Base) MCG/ACT inhaler Commonly known as: VENTOLIN  HFA Inhale 1-2 puffs into the lungs every 6 (six) hours as needed for wheezing or shortness of breath.   atorvastatin  40 MG tablet Commonly known as: LIPITOR Take 40 mg by mouth at bedtime.   cefdinir  300 MG capsule Commonly known as: OMNICEF  Take 1 capsule (300 mg total) by mouth 2 (two) times daily.   diazepam  10 MG tablet Commonly known as: VALIUM  Take 10 mg by mouth 2 (two) times daily.   diclofenac  Sodium 1 %  Gel Commonly known as: VOLTAREN  Apply 2 g topically 4 (four) times daily.   donepezil  10 MG tablet Commonly known as: ARICEPT  Take 10 mg by mouth at bedtime.   doxycycline  100 MG tablet Commonly known as: VIBRA -TABS Take 1 tablet (100 mg total) by mouth every 12 (twelve) hours.   DULoxetine  30 MG capsule Commonly known as: Cymbalta  Take 1 capsule (30 mg total) by mouth daily.   famotidine  20 MG  tablet Commonly known as: PEPCID  Take 20 mg by mouth 2 (two) times daily.   furosemide  20 MG tablet Commonly known as: LASIX  Take 1 tablet (20 mg total) by mouth daily.   glycopyrrolate  1 MG tablet Commonly known as: ROBINUL  Take 1 mg by mouth 2 (two) times daily.   insulin  aspart 100 UNIT/ML injection Commonly known as: novoLOG  Inject 2-10 Units into the skin 3 (three) times daily before meals. Sliding scale: check sugar before meals <200=0; 200-250=2 units;251-300=4 units;301-350=6 units;351-400= 8 units;>400=10 units and call MD   insulin  glargine 100 UNIT/ML injection Commonly known as: LANTUS  Inject 15 Units into the skin daily. Hold for BS<160   levocetirizine 5 MG tablet Commonly known as: XYZAL  Take 5 mg by mouth daily.   levothyroxine  100 MCG tablet Commonly known as: SYNTHROID  Take 100 mcg by mouth daily before breakfast.   OLANZapine  2.5 MG tablet Commonly known as: ZYPREXA  Take 2.5 mg by mouth 2 (two) times daily.   Oxcarbazepine  300 MG tablet Commonly known as: TRILEPTAL  Take 300 mg by mouth 2 (two) times daily.   potassium chloride  SA 20 MEQ tablet Commonly known as: KLOR-CON  M Take 20 mEq by mouth daily.   prazosin  1 MG capsule Commonly known as: MINIPRESS  Take 1 mg by mouth at bedtime.   Spiriva  HandiHaler 18 MCG Caps Generic drug: Tiotropium Bromide  Irrigate with 1 capsule as directed daily.   traZODone  100 MG tablet Commonly known as: DESYREL  Take 100 mg by mouth at bedtime.   Vraylar  4.5 MG Caps Generic drug: Cariprazine  HCl Take 1 capsule by mouth daily.   ziprasidone  80 MG capsule Commonly known as: GEODON  Take 80 mg by mouth at bedtime.   zolpidem  5 MG tablet Commonly known as: AMBIEN  Take 5 mg by mouth at bedtime.               Durable Medical Equipment  (From admission, onward)           Start     Ordered   05/19/24 1011  For home use only DME oxygen  Once       Question Answer Comment  Length of Need 6 Months    Mode or (Route) Nasal cannula   Liters per Minute 2   Frequency Continuous (stationary and portable oxygen unit needed)   Oxygen conserving device Yes   Oxygen delivery system: Gas   Oxygen delivery system: Concentrator      05/19/24 1010            Discharge Exam: Filed Weights   05/17/24 1054 05/17/24 1502  Weight: 73.5 kg 98.3 kg   HEENT:  [place]/AT, No thrush, no icterus CV:  RRR, no rub, no S3, no S4 Lung:  bibasilar rales.  No wheeze Abd:  soft/+BS, NT Ext:  No edema, no lymphangitis, no synovitis, no rash   Condition at discharge: stable  The results of significant diagnostics from this hospitalization (including imaging, microbiology, ancillary and laboratory) are listed below for reference.   Imaging Studies: US  Venous Img Lower Unilateral Left (DVT)  Result Date: 05/17/2024 CLINICAL DATA:  edema.  SHORTNESS OF BREATH. EXAM: LEFT LOWER EXTREMITY VENOUS DOPPLER ULTRASOUND TECHNIQUE: Gray-scale sonography with compression, as well as color and duplex ultrasound, were performed to evaluate the deep venous system(s) from the level of the common femoral vein through the popliteal and proximal calf veins. COMPARISON:  CHEST XR, CONCURRENT. FINDINGS: VENOUS Normal compressibility of the common femoral, superficial femoral, and popliteal veins, as well as the visualized calf veins. Visualized portions of profunda femoral vein and great saphenous vein unremarkable. No filling defects to suggest DVT on grayscale or color Doppler imaging. Doppler waveforms show normal direction of venous flow, normal respiratory plasticity and response to augmentation. Limited views of the contralateral common femoral vein are unremarkable. OTHER No evidence of superficial thrombophlebitis or abnormal fluid collection. Limitations: none IMPRESSION: No evidence of femoropopliteal DVT or superficial thrombophlebitis within the LEFT lower extremity. Electronically Signed   By: Thom Hall M.D.   On:  05/17/2024 12:58   DG Chest Port 1 View Result Date: 05/17/2024 CLINICAL DATA:  Shortness of breath. EXAM: PORTABLE CHEST 1 VIEW COMPARISON:  01/15/2024 FINDINGS: The heart size and mediastinal contours are within normal limits. Increased patchy opacities in left retrocardiac lung base, suspicious for infiltrate. No pleural effusion. IMPRESSION: Possible new left retrocardiac infiltrate. Continued chest radiographic follow-up recommended. Electronically Signed   By: Norleen DELENA Kil M.D.   On: 05/17/2024 11:43    Microbiology: Results for orders placed or performed during the hospital encounter of 05/17/24  Resp panel by RT-PCR (RSV, Flu A&B, Covid) Anterior Nasal Swab     Status: None   Collection Time: 05/17/24 11:22 AM   Specimen: Anterior Nasal Swab  Result Value Ref Range Status   SARS Coronavirus 2 by RT PCR NEGATIVE NEGATIVE Final    Comment: (NOTE) SARS-CoV-2 target nucleic acids are NOT DETECTED.  The SARS-CoV-2 RNA is generally detectable in upper respiratory specimens during the acute phase of infection. The lowest concentration of SARS-CoV-2 viral copies this assay can detect is 138 copies/mL. A negative result does not preclude SARS-Cov-2 infection and should not be used as the sole basis for treatment or other patient management decisions. A negative result may occur with  improper specimen collection/handling, submission of specimen other than nasopharyngeal swab, presence of viral mutation(s) within the areas targeted by this assay, and inadequate number of viral copies(<138 copies/mL). A negative result must be combined with clinical observations, patient history, and epidemiological information. The expected result is Negative.  Fact Sheet for Patients:  bloggercourse.com  Fact Sheet for Healthcare Providers:  seriousbroker.it  This test is no t yet approved or cleared by the [place]  FDA and  has been authorized  for detection and/or diagnosis of SARS-CoV-2 by FDA under an Emergency Use Authorization (EUA). This EUA will remain  in effect (meaning this test can be used) for the duration of the COVID-19 declaration under Section 564(b)(1) of the Act, 21 U.S.C.section 360bbb-3(b)(1), unless the authorization is terminated  or revoked sooner.       Influenza A by PCR NEGATIVE NEGATIVE Final   Influenza B by PCR NEGATIVE NEGATIVE Final    Comment: (NOTE) The Xpert Xpress SARS-CoV-2/FLU/RSV plus assay is intended as an aid in the diagnosis of influenza from Nasopharyngeal swab specimens and should not be used as a sole basis for treatment. Nasal washings and aspirates are unacceptable for Xpert Xpress SARS-CoV-2/FLU/RSV testing.  Fact Sheet for Patients: bloggercourse.com  Fact Sheet for Healthcare Providers: seriousbroker.it  This test is  not yet approved or cleared by the [place]  FDA and has been authorized for detection and/or diagnosis of SARS-CoV-2 by FDA under an Emergency Use Authorization (EUA). This EUA will remain in effect (meaning this test can be used) for the duration of the COVID-19 declaration under Section 564(b)(1) of the Act, 21 U.S.C. section 360bbb-3(b)(1), unless the authorization is terminated or revoked.     Resp Syncytial Virus by PCR NEGATIVE NEGATIVE Final    Comment: (NOTE) Fact Sheet for Patients: bloggercourse.com  Fact Sheet for Healthcare Providers: seriousbroker.it  This test is not yet approved or cleared by the [place]  FDA and has been authorized for detection and/or diagnosis of SARS-CoV-2 by FDA under an Emergency Use Authorization (EUA). This EUA will remain in effect (meaning this test can be used) for the duration of the COVID-19 declaration under Section 564(b)(1) of the Act, 21 U.S.C. section 360bbb-3(b)(1), unless the authorization is  terminated or revoked.  Performed at [facility], [address] [place], [place] [address]     Labs: CBC: Recent Labs  Lab 05/17/24 1101 05/18/24 0420 05/19/24 0504  WBC 12.1* 11.1* 8.5  NEUTROABS 10.2* 8.0* 5.2  HGB 11.4* 10.3* 10.1*  HCT 35.8* 32.3* 32.2*  MCV 96.2 94.7 97.3  PLT 217 199 180   Basic Metabolic Panel: Recent Labs  Lab 05/17/24 1101 05/18/24 0420  NA 141 141  K 4.1 3.8  CL 100 101  CO2 30 28  GLUCOSE 204* 174*  BUN 32* 26*  CREATININE 1.25* 1.09*  CALCIUM  9.3 8.9   Liver Function Tests: Recent Labs  Lab 05/17/24 1101  AST 24  ALT 23  ALKPHOS 132*  BILITOT 0.2  PROT 6.5  ALBUMIN 3.5   CBG: Recent Labs  Lab 05/18/24 0725 05/18/24 1113 05/18/24 1552 05/18/24 2030 05/19/24 0713  GLUCAP 166* 175* 208* 120* 156*    Discharge time spent: greater than 30 minutes.  Signed: Alm Schneider, MD [facility] 05/19/2024 "

## 2024-05-19 NOTE — Plan of Care (Signed)
 " Problem: Education: Goal: Knowledge of General Education information will improve Description: Including pain rating scale, medication(s)/side effects and non-pharmacologic comfort measures Outcome: Adequate for Discharge   Problem: Education: Goal: Individualized Educational Video(s) Outcome: Progressing   Problem: Education: Goal: Knowledge of General Education information will improve Description: Including pain rating scale, medication(s)/side effects and non-pharmacologic comfort measures Outcome: Adequate for Discharge   Problem: Health Behavior/Discharge Planning: Goal: Ability to manage health-related needs will improve Outcome: Adequate for Discharge   Problem: Clinical Measurements: Goal: Ability to maintain clinical measurements within normal limits will improve Outcome: Adequate for Discharge Goal: Will remain free from infection Outcome: Adequate for Discharge Goal: Diagnostic test results will improve Outcome: Adequate for Discharge Goal: Respiratory complications will improve Outcome: Adequate for Discharge Goal: Cardiovascular complication will be avoided Outcome: Adequate for Discharge   Problem: Activity: Goal: Risk for activity intolerance will decrease Outcome: Adequate for Discharge   Problem: Nutrition: Goal: Adequate nutrition will be maintained Outcome: Adequate for Discharge   Problem: Coping: Goal: Level of anxiety will decrease Outcome: Adequate for Discharge   Problem: Elimination: Goal: Will not experience complications related to bowel motility Outcome: Adequate for Discharge Goal: Will not experience complications related to urinary retention Outcome: Adequate for Discharge   Problem: Pain Managment: Goal: General experience of comfort will improve and/or be controlled Outcome: Adequate for Discharge   Problem: Safety: Goal: Ability to remain free from injury will improve Outcome: Adequate for Discharge   Problem: Skin  Integrity: Goal: Risk for impaired skin integrity will decrease Outcome: Adequate for Discharge   Problem: Education: Goal: Ability to describe self-care measures that may prevent or decrease complications (Diabetes Survival Skills Education) will improve Outcome: Adequate for Discharge   Problem: Coping: Goal: Ability to adjust to condition or change in health will improve Outcome: Adequate for Discharge   Problem: Fluid Volume: Goal: Ability to maintain a balanced intake and output will improve Outcome: Adequate for Discharge   Problem: Health Behavior/Discharge Planning: Goal: Ability to identify and utilize available resources and services will improve Outcome: Adequate for Discharge Goal: Ability to manage health-related needs will improve Outcome: Adequate for Discharge   Problem: Metabolic: Goal: Ability to maintain appropriate glucose levels will improve Outcome: Adequate for Discharge   Problem: Nutritional: Goal: Maintenance of adequate nutrition will improve Outcome: Adequate for Discharge Goal: Progress toward achieving an optimal weight will improve Outcome: Adequate for Discharge   Problem: Skin Integrity: Goal: Risk for impaired skin integrity will decrease Outcome: Adequate for Discharge   Problem: Tissue Perfusion: Goal: Adequacy of tissue perfusion will improve Outcome: Adequate for Discharge   Problem: Health Behavior/Discharge Planning: Goal: Ability to manage health-related needs will improve Outcome: Adequate for Discharge   Problem: Clinical Measurements: Goal: Ability to maintain clinical measurements within normal limits will improve Outcome: Adequate for Discharge Goal: Will remain free from infection Outcome: Adequate for Discharge Goal: Diagnostic test results will improve Outcome: Adequate for Discharge Goal: Respiratory complications will improve Outcome: Adequate for Discharge Goal: Cardiovascular complication will be  avoided Outcome: Adequate for Discharge   Problem: Activity: Goal: Risk for activity intolerance will decrease Outcome: Adequate for Discharge   Problem: Nutrition: Goal: Adequate nutrition will be maintained Outcome: Adequate for Discharge   Problem: Coping: Goal: Level of anxiety will decrease Outcome: Adequate for Discharge   Problem: Elimination: Goal: Will not experience complications related to bowel motility Outcome: Adequate for Discharge Goal: Will not experience complications related to urinary retention Outcome: Adequate for Discharge   Problem: Pain  Managment: Goal: General experience of comfort will improve and/or be controlled Outcome: Adequate for Discharge   Problem: Safety: Goal: Ability to remain free from injury will improve Outcome: Adequate for Discharge   Problem: Skin Integrity: Goal: Risk for impaired skin integrity will decrease Outcome: Adequate for Discharge   Problem: Education: Goal: Ability to describe self-care measures that may prevent or decrease complications (Diabetes Survival Skills Education) will improve Outcome: Adequate for Discharge Goal: Individualized Educational Video(s) Outcome: Progressing   Problem: Fluid Volume: Goal: Ability to maintain a balanced intake and output will improve Outcome: Adequate for Discharge   Problem: Health Behavior/Discharge Planning: Goal: Ability to identify and utilize available resources and services will improve Outcome: Adequate for Discharge Goal: Ability to manage health-related needs will improve Outcome: Adequate for Discharge   Problem: Metabolic: Goal: Ability to maintain appropriate glucose levels will improve Outcome: Adequate for Discharge   Problem: Nutritional: Goal: Maintenance of adequate nutrition will improve Outcome: Adequate for Discharge Goal: Progress toward achieving an optimal weight will improve Outcome: Adequate for Discharge   Problem: Skin Integrity: Goal:  Risk for impaired skin integrity will decrease Outcome: Adequate for Discharge   Problem: Tissue Perfusion: Goal: Adequacy of tissue perfusion will improve Outcome: Adequate for Discharge   "

## 2024-05-19 NOTE — [place] FL2 (Signed)
 " [place]  MEDICAID FL2 LEVEL OF CARE FORM     IDENTIFICATION  Patient Name: Carrie Clark Birthdate: Oct 09, 1960 Sex: female Admission Date (Current Location): 05/17/2024  [facility] and [place] Number:  [employer] and Address:  [facility],  [address] [place] [address]      Provider Number: [id number]  Attending Physician Name and Address:  Evonnie Lenis, MD  Relative Name and Phone Number:  (Legal Guardian) [phone]    Current Level of Care: Hospital Recommended Level of Care: Family Care Home Prior Approval Number:    Date Approved/Denied:   PASRR Number:    Discharge Plan: Other (Comment) (Family Care Home)    Current Diagnoses: Patient Active Problem List   Diagnosis Date Noted   Lobar pneumonia 05/19/2024   Acute respiratory failure with hypoxia (HCC) 05/19/2024   CAP (community acquired pneumonia) 05/17/2024   Community acquired pneumonia 05/17/2024   Pain due to onychomycosis of toenails of both feet 10/18/2020   UTI (urinary tract infection) 12/03/2018   Hypothyroidism 12/03/2018   HTN (hypertension) 12/03/2018   COPD (chronic obstructive pulmonary disease) (HCC) 12/03/2018   Diabetes (HCC) 12/03/2018   Altered mental status 12/03/2018   Schizoaffective disorder, bipolar type (HCC) 09/11/2015   Aggression    Episode of behavior change    Psychoses (HCC)     Orientation RESPIRATION BLADDER Height & Weight     Self    Continent Weight: 216 lb 11.4 oz (98.3 kg) Height:  5' 4 (162.6 cm)  BEHAVIORAL SYMPTOMS/MOOD NEUROLOGICAL BOWEL NUTRITION STATUS      Continent Diet (No concentrated sweets)  AMBULATORY STATUS COMMUNICATION OF NEEDS Skin   Independent (walker to use if and when needed) Verbally Normal                       Personal Care Assistance Level of Assistance  Bathing, Feeding, Dressing Bathing Assistance: Limited assistance Feeding assistance: Independent Dressing Assistance: Limited assistance      Functional Limitations Info  Sight, Hearing, Speech Sight Info: Adequate Hearing Info: Adequate Speech Info: Adequate    SPECIAL CARE FACTORS FREQUENCY                       Contractures Contractures Info: Not present    Additional Factors Info  Code Status, Allergies Code Status Info: FULL Allergies Info: Fish Allergy           Current Medications (05/19/2024):  This is the current hospital active medication list Current Facility-Administered Medications  Medication Dose Route Frequency Provider Last Rate Last Admin   acetaminophen  (TYLENOL ) tablet 650 mg  650 mg Oral Q6H PRN Vicci, Clanford L, MD   650 mg at 05/18/24 2221   Or   acetaminophen  (TYLENOL ) suppository 650 mg  650 mg Rectal Q6H PRN Johnson, Clanford L, MD       amoxicillin -clavulanate (AUGMENTIN ) 875-125 MG per tablet 1 tablet  1 tablet Oral Q12H Tat, Lenis, MD       atorvastatin  (LIPITOR) tablet 40 mg  40 mg Oral QHS Johnson, Clanford L, MD   40 mg at 05/18/24 2217   bisacodyl  (DULCOLAX) EC tablet 5 mg  5 mg Oral Daily PRN Vicci, Clanford L, MD   5 mg at 05/18/24 1804   dextromethorphan  (DELSYM ) 30 MG/5ML liquid 30 mg  30 mg Oral BID PRN Johnson, Clanford L, MD       diazepam  (VALIUM ) tablet 10 mg  10 mg Oral BID  Vicci, Clanford L, MD   10 mg at 05/19/24 9061   donepezil  (ARICEPT ) tablet 10 mg  10 mg Oral QHS Johnson, Clanford L, MD   10 mg at 05/18/24 2220   doxycycline  (VIBRA -TABS) tablet 100 mg  100 mg Oral Q12H Johnson, Clanford L, MD   100 mg at 05/19/24 9062   DULoxetine  (CYMBALTA ) DR capsule 30 mg  30 mg Oral Daily Johnson, Clanford L, MD   30 mg at 05/19/24 9060   enoxaparin  (LOVENOX ) injection 40 mg  40 mg Subcutaneous Q24H Johnson, Clanford L, MD   40 mg at 05/18/24 1540   famotidine  (PEPCID ) tablet 20 mg  20 mg Oral BID Vicci, Clanford L, MD   20 mg at 05/19/24 9060   glycopyrrolate  (ROBINUL ) tablet 1 mg  1 mg Oral BID Vicci, Clanford L, MD   1 mg at 05/19/24 9060   guaiFENesin   (MUCINEX ) 12 hr tablet 600 mg  600 mg Oral BID Vicci, Clanford L, MD   600 mg at 05/19/24 9060   haloperidol  lactate (HALDOL ) injection 2 mg  2 mg Intravenous Q6H PRN Johnson, Clanford L, MD       hydrALAZINE  (APRESOLINE ) injection 10 mg  10 mg Intravenous Q4H PRN Johnson, Clanford L, MD       insulin  aspart (novoLOG ) injection 0-15 Units  0-15 Units Subcutaneous TID WC Johnson, Clanford L, MD   3 Units at 05/19/24 0743   insulin  glargine (LANTUS ) injection 12 Units  12 Units Subcutaneous Daily Johnson, Clanford L, MD   12 Units at 05/19/24 0936   ipratropium-albuterol  (DUONEB) 0.5-2.5 (3) MG/3ML nebulizer solution 3 mL  3 mL Nebulization BID Tat, Alm, MD       levothyroxine  (SYNTHROID ) tablet 100 mcg  100 mcg Oral QAC breakfast Vicci, Clanford L, MD   100 mcg at 05/19/24 9383   loratadine  (CLARITIN ) tablet 10 mg  10 mg Oral Daily Johnson, Clanford L, MD   10 mg at 05/19/24 9062   OLANZapine  (ZYPREXA ) tablet 2.5 mg  2.5 mg Oral BID Vicci, Clanford L, MD   2.5 mg at 05/19/24 0940   oxyCODONE  (Oxy IR/ROXICODONE ) immediate release tablet 5 mg  5 mg Oral Q4H PRN Johnson, Clanford L, MD       pantoprazole  (PROTONIX ) EC tablet 40 mg  40 mg Oral QPM Johnson, Clanford L, MD   40 mg at 05/18/24 1804   potassium chloride  SA (KLOR-CON  M) CR tablet 20 mEq  20 mEq Oral Daily Johnson, Clanford L, MD   20 mEq at 05/19/24 9061   prazosin  (MINIPRESS ) capsule 1 mg  1 mg Oral QHS Johnson, Clanford L, MD   1 mg at 05/18/24 2215   prochlorperazine  (COMPAZINE ) injection 10 mg  10 mg Intravenous Q4H PRN Johnson, Clanford L, MD       traZODone  (DESYREL ) tablet 100 mg  100 mg Oral QHS Johnson, Clanford L, MD   100 mg at 05/18/24 2221     Discharge Medications: Allergies as of 05/19/2024       Reactions   Fish Allergy Other (See Comments)   Allergy reaction not listed- on MAR        Medication List     TAKE these medications    trihexyphenidyl 2 MG tablet Commonly known as: ARTANE Take 2 mg by mouth  at bedtime. The timing of this medication is very important.   acetaminophen  325 MG tablet Commonly known as: TYLENOL  Take 650 mg by mouth every 6 (six) hours as needed for  moderate pain (pain score 4-6).   albuterol  108 (90 Base) MCG/ACT inhaler Commonly known as: VENTOLIN  HFA Inhale 1-2 puffs into the lungs every 6 (six) hours as needed for wheezing or shortness of breath.   atorvastatin  40 MG tablet Commonly known as: LIPITOR Take 40 mg by mouth at bedtime.   cefdinir  300 MG capsule Commonly known as: OMNICEF  Take 1 capsule (300 mg total) by mouth 2 (two) times daily.   diazepam  10 MG tablet Commonly known as: VALIUM  Take 10 mg by mouth 2 (two) times daily.   diclofenac  Sodium 1 % Gel Commonly known as: VOLTAREN  Apply 2 g topically 4 (four) times daily.   donepezil  10 MG tablet Commonly known as: ARICEPT  Take 10 mg by mouth at bedtime.   doxycycline  100 MG tablet Commonly known as: VIBRA -TABS Take 1 tablet (100 mg total) by mouth every 12 (twelve) hours.   DULoxetine  30 MG capsule Commonly known as: Cymbalta  Take 1 capsule (30 mg total) by mouth daily.   famotidine  20 MG tablet Commonly known as: PEPCID  Take 20 mg by mouth 2 (two) times daily.   furosemide  20 MG tablet Commonly known as: LASIX  Take 1 tablet (20 mg total) by mouth daily.   glycopyrrolate  1 MG tablet Commonly known as: ROBINUL  Take 1 mg by mouth 2 (two) times daily.   insulin  aspart 100 UNIT/ML injection Commonly known as: novoLOG  Inject 2-10 Units into the skin 3 (three) times daily before meals. Sliding scale: check sugar before meals <200=0; 200-250=2 units;251-300=4 units;301-350=6 units;351-400= 8 units;>400=10 units and call MD   insulin  glargine 100 UNIT/ML injection Commonly known as: LANTUS  Inject 15 Units into the skin daily. Hold for BS<160   levocetirizine 5 MG tablet Commonly known as: XYZAL  Take 5 mg by mouth daily.   levothyroxine  100 MCG tablet Commonly known as:  SYNTHROID  Take 100 mcg by mouth daily before breakfast.   OLANZapine  2.5 MG tablet Commonly known as: ZYPREXA  Take 2.5 mg by mouth 2 (two) times daily.   Oxcarbazepine  300 MG tablet Commonly known as: TRILEPTAL  Take 300 mg by mouth 2 (two) times daily.   potassium chloride  SA 20 MEQ tablet Commonly known as: KLOR-CON  M Take 20 mEq by mouth daily.   prazosin  1 MG capsule Commonly known as: MINIPRESS  Take 1 mg by mouth at bedtime.   Spiriva  HandiHaler 18 MCG Caps Generic drug: Tiotropium Bromide  Irrigate with 1 capsule as directed daily.   traZODone  100 MG tablet Commonly known as: DESYREL  Take 100 mg by mouth at bedtime.   Vraylar  4.5 MG Caps Generic drug: Cariprazine  HCl Take 1 capsule by mouth daily.   ziprasidone  80 MG capsule Commonly known as: GEODON  Take 80 mg by mouth at bedtime.   zolpidem  5 MG tablet Commonly known as: AMBIEN  Take 5 mg by mouth at bedtime.               Durable Medical Equipment  (From admission, onward)           Start     Ordered   05/19/24 1011  For home use only DME oxygen  Once       Question Answer Comment  Length of Need 6 Months   Mode or (Route) Nasal cannula   Liters per Minute 2   Frequency Continuous (stationary and portable oxygen unit needed)   Oxygen conserving device Yes   Oxygen delivery system: Gas   Oxygen delivery system: Concentrator      05/19/24 1010  Relevant Imaging Results:  Relevant Lab Results:   Additional Information SSN: [id number]  Lucie Lunger, [place]     "

## 2024-05-19 NOTE — Progress Notes (Signed)
 SATURATION QUALIFICATIONS: (This note is used to comply with regulatory documentation for home oxygen)   Patient Saturations on Room Air at Rest = 94   Patient Saturations on Room Air while Ambulating = 86   Patient Saturations on 2 Liters of oxygen while Ambulating = 93   Please briefly explain why patient needs home oxygen: To maintain 02 sat at 90% or above during ambulation.   Alm Schneider, DO

## 2024-05-19 NOTE — TOC Transition Note (Signed)
 Transition of Care [facility]) - Discharge Note   Patient Details  Name: Carrie Clark MRN: [id number] Date of Birth: February 17, 1961  [facility] CM/SW Contact:  Lucie Lunger, LCSWA Phone Number: 05/19/2024, 11:09 AM   Clinical Narrative:    CSW updated that pt will need O2 at D/C. CSW spoke to Stephen with group home who confirms no agency preference. CSW sent referral to Zack with [employer], requested home set up be completed today. CSW updated Randie of plan for D/C. D/C clinicals sent to [facility] via secure email. Secure voicemail left for pts legal guardian providing updates. TOC signing off.   Final next level of care: Group Home Barriers to Discharge: Barriers Resolved   Patient Goals and CMS Choice Patient states their goals for this hospitalization and ongoing recovery are:: return home CMS Medicare.gov Compare Post Acute Care list provided to:: Patient Represenative (must comment) Choice offered to / list presented to : [facility] POA / Guardian      Discharge Placement                       Discharge Plan and Services Additional resources added to the After Visit Summary for   In-house Referral: Clinical Social Work Discharge Planning Services: CM Consult            DME Arranged: Oxygen DME Agency: [employer] Date DME Agency Contacted: 05/19/24   Representative spoke with at DME Agency: Zack            Social Drivers of Health (SDOH) Interventions SDOH Screenings   Food Insecurity: No Food Insecurity (05/17/2024)  Housing: Patient Declined (05/18/2024)  Transportation Needs: No Transportation Needs (05/17/2024)  Utilities: Not At Risk (05/17/2024)  Tobacco Use: Low Risk (05/17/2024)     Readmission Risk Interventions    05/18/2024   10:05 AM  Readmission Risk Prevention Plan  Transportation Screening Complete  Home Care Screening Complete  Medication Review (RN CM) Complete

## 2024-05-30 ENCOUNTER — Emergency Department (HOSPITAL_COMMUNITY)
Admission: EM | Admit: 2024-05-30 | Discharge: 2024-05-30 | Disposition: A | Discharge status: Home / Self Care | Attending: Emergency Medicine | Admitting: Emergency Medicine

## 2024-05-30 ENCOUNTER — Emergency Department (HOSPITAL_COMMUNITY)

## 2024-05-30 ENCOUNTER — Emergency Department (HOSPITAL_COMMUNITY)
Admission: EM | Admit: 2024-05-30 | Discharge: 2024-05-30 | Disposition: A | Discharge status: Home / Self Care | Source: Skilled Nursing Facility | Attending: Emergency Medicine | Admitting: Emergency Medicine

## 2024-05-30 ENCOUNTER — Other Ambulatory Visit: Payer: Self-pay

## 2024-05-30 ENCOUNTER — Encounter (HOSPITAL_COMMUNITY): Payer: Self-pay | Admitting: Emergency Medicine

## 2024-05-30 DIAGNOSIS — Z794 Long term (current) use of insulin: Secondary | ICD-10-CM | POA: Diagnosis not present

## 2024-05-30 DIAGNOSIS — Z79899 Other long term (current) drug therapy: Secondary | ICD-10-CM | POA: Insufficient documentation

## 2024-05-30 DIAGNOSIS — E039 Hypothyroidism, unspecified: Secondary | ICD-10-CM | POA: Insufficient documentation

## 2024-05-30 DIAGNOSIS — M79672 Pain in left foot: Secondary | ICD-10-CM | POA: Diagnosis not present

## 2024-05-30 DIAGNOSIS — E119 Type 2 diabetes mellitus without complications: Secondary | ICD-10-CM | POA: Insufficient documentation

## 2024-05-30 DIAGNOSIS — Z7984 Long term (current) use of oral hypoglycemic drugs: Secondary | ICD-10-CM | POA: Diagnosis not present

## 2024-05-30 DIAGNOSIS — M79671 Pain in right foot: Secondary | ICD-10-CM | POA: Insufficient documentation

## 2024-05-30 DIAGNOSIS — R600 Localized edema: Secondary | ICD-10-CM | POA: Diagnosis present

## 2024-05-30 DIAGNOSIS — J449 Chronic obstructive pulmonary disease, unspecified: Secondary | ICD-10-CM | POA: Insufficient documentation

## 2024-05-30 DIAGNOSIS — I10 Essential (primary) hypertension: Secondary | ICD-10-CM | POA: Diagnosis not present

## 2024-05-30 DIAGNOSIS — R531 Weakness: Secondary | ICD-10-CM | POA: Diagnosis not present

## 2024-05-30 LAB — CBC WITH DIFFERENTIAL/PLATELET
Abs Immature Granulocytes: 0.01 K/uL (ref 0.00–0.07)
Basophils Absolute: 0 K/uL (ref 0.0–0.1)
Basophils Relative: 1 %
Eosinophils Absolute: 0.2 K/uL (ref 0.0–0.5)
Eosinophils Relative: 3 %
HCT: 35.9 % — ABNORMAL LOW (ref 36.0–46.0)
Hemoglobin: 11 g/dL — ABNORMAL LOW (ref 12.0–15.0)
Immature Granulocytes: 0 %
Lymphocytes Relative: 30 %
Lymphs Abs: 2.2 K/uL (ref 0.7–4.0)
MCH: 30.2 pg (ref 26.0–34.0)
MCHC: 30.6 g/dL (ref 30.0–36.0)
MCV: 98.6 fL (ref 80.0–100.0)
Monocytes Absolute: 0.5 K/uL (ref 0.1–1.0)
Monocytes Relative: 8 %
Neutro Abs: 4.2 K/uL (ref 1.7–7.7)
Neutrophils Relative %: 58 %
Platelets: 192 K/uL (ref 150–400)
RBC: 3.64 MIL/uL — ABNORMAL LOW (ref 3.87–5.11)
RDW: 15.7 % — ABNORMAL HIGH (ref 11.5–15.5)
WBC: 7.2 K/uL (ref 4.0–10.5)
nRBC: 0 % (ref 0.0–0.2)

## 2024-05-30 LAB — COMPREHENSIVE METABOLIC PANEL WITH GFR
ALT: 17 U/L (ref 0–44)
AST: 24 U/L (ref 15–41)
Albumin: 3.7 g/dL (ref 3.5–5.0)
Alkaline Phosphatase: 144 U/L — ABNORMAL HIGH (ref 38–126)
Anion gap: 11 (ref 5–15)
BUN: 26 mg/dL — ABNORMAL HIGH (ref 8–23)
CO2: 31 mmol/L (ref 22–32)
Calcium: 9.5 mg/dL (ref 8.9–10.3)
Chloride: 100 mmol/L (ref 98–111)
Creatinine, Ser: 1.26 mg/dL — ABNORMAL HIGH (ref 0.44–1.00)
GFR, Estimated: 48 mL/min — ABNORMAL LOW
Glucose, Bld: 145 mg/dL — ABNORMAL HIGH (ref 70–99)
Potassium: 4 mmol/L (ref 3.5–5.1)
Sodium: 141 mmol/L (ref 135–145)
Total Bilirubin: 0.2 mg/dL (ref 0.0–1.2)
Total Protein: 6.6 g/dL (ref 6.5–8.1)

## 2024-05-30 LAB — URINALYSIS, ROUTINE W REFLEX MICROSCOPIC
Bilirubin Urine: NEGATIVE
Glucose, UA: NEGATIVE mg/dL
Hgb urine dipstick: NEGATIVE
Ketones, ur: NEGATIVE mg/dL
Leukocytes,Ua: NEGATIVE
Nitrite: NEGATIVE
Protein, ur: NEGATIVE mg/dL
Specific Gravity, Urine: 1.009 (ref 1.005–1.030)
pH: 5 (ref 5.0–8.0)

## 2024-05-30 LAB — MAGNESIUM: Magnesium: 2.1 mg/dL (ref 1.7–2.4)

## 2024-05-30 LAB — CBG MONITORING, ED: Glucose-Capillary: 142 mg/dL — ABNORMAL HIGH (ref 70–99)

## 2024-05-30 NOTE — Discharge Instructions (Signed)
 May give patient Tylenol  1000 g every 6 hours as needed for pain.  She was complaining of her she was hurting so this was most likely reason she would not walk.  She walked well with a walker while in ED this evening.  Have her follow-up with primary care doctor within the week for recheck.  Return to ED for any worsening symptoms including new falls or severe weakness

## 2024-05-30 NOTE — ED Notes (Signed)
 EMS Called To Take Her Back to [facility]

## 2024-05-30 NOTE — ED Notes (Signed)
 Ambulation trial is completed at this time with a walker. Pt was able to walk with a walker with no assistance. Pt's shoes were changed to a large pair of grip socks. Pt states her shoes felt too tight and uncomfortable.

## 2024-05-30 NOTE — ED Notes (Signed)
 Spoke with Randie from [facility] about pt walking with walker. She agreed to accept pt at this time.

## 2024-05-30 NOTE — Discharge Instructions (Signed)
 Your testing has been normal, there is no findings of kidney problems liver problems or anemia, no signs of pneumonia, your heart rate and blood pressure have been very normal, I do want you to follow-up closely with your doctor for repeat check within 48 hours, return to the ER for severe worsening symptoms

## 2024-05-30 NOTE — ED Notes (Signed)
 Pt fell asleep about an hr after food was given.   Pt is resting at this time

## 2024-05-30 NOTE — ED Triage Notes (Signed)
 Pt was seen earlier today for weakness and not able to ambulate and was seen by Dr. Cleotilde. Pt was sent back to group home via [facility] and the facility refused to accept her due to not ambulating.

## 2024-05-30 NOTE — ED Notes (Signed)
 Pt was given a sandwich, chips and a sprite for dinner.

## 2024-05-30 NOTE — ED Notes (Signed)
 Attempted to call group home so the could voice their concerns about pt ambulating, but there is no answer. I also called legal guardian's number which is [facility] DSS after hours number and left message with them as well.

## 2024-05-30 NOTE — ED Notes (Signed)
 In and out obtained, brief back on.

## 2024-05-30 NOTE — ED Provider Notes (Signed)
 " [place] EMERGENCY DEPARTMENT AT [facility] Provider Note   CSN: [id number] Arrival date & time: 05/30/24  1158     Patient presents with: Weakness   Carrie Clark is a 64 y.o. female.    Weakness  This patient is a 64 year old female known to the emergency department for a history of COPD, some degree of chronic pain, she has been seen in the emergency department multiple times for these complaints, she also has schizoaffective disorder and is currently in a nursing facility.  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  A review of the medical record shows that her initial blood gas had a CO2 of 62 and a pO2 of 50 on a venous blood gas.  She was in the hospital from December 22 to December 24, ultimately the patient was treated with ceftriaxone  and doxycycline , and was discharged to the assisted living facility with cefdinir  and doxycycline  for 4 more days which she has completed.  There has been no complaints of shortness of breath, staff at the facility where she lives reports that she was not wanting to walk today, she did not want to come to the hospital but the staff said that her legs were weak.  When the paramedics arrived the patient refused to assist with getting up although she had been walking earlier in the day.  Her affect was odd that she was laughing and having fun with the paramedics but refusing to move her legs.  She denies having any pain in her legs, she will not answer any other questions for me other than saying yes or no and denying fevers chills nausea vomiting coughing or shortness of breath.  Specifically she has no back pain  ,  Prior to Admission medications  Medication Sig Start Date End Date Taking? Authorizing Provider   acetaminophen  (TYLENOL ) 325 MG tablet Take 650 mg by mouth every 6 (six) hours as needed for moderate pain (pain score 4-6).    [provider]  albuterol  (VENTOLIN  HFA) 108 (90 Base) MCG/ACT inhaler Inhale 1-2 puffs into the lungs every 6 (six) hours as needed for wheezing or shortness of breath. 01/15/24   Suellen, Celeste A, PA-C  atorvastatin  (LIPITOR) 40 MG tablet Take 40 mg by mouth at bedtime.     [provider]  cefdinir  (OMNICEF ) 300 MG capsule Take 1 capsule (300 mg total) by mouth 2 (two) times daily. 05/19/24   Evonnie Lenis, MD  diazepam  (VALIUM ) 10 MG tablet Take 10 mg by mouth 2 (two) times daily.    [provider]  diclofenac  Sodium (VOLTAREN ) 1 % GEL Apply 2 g topically 4 (four) times daily.    [provider]  donepezil  (ARICEPT ) 10 MG tablet Take 10 mg by mouth at bedtime.    [provider]  doxycycline  (VIBRA -TABS) 100 MG tablet Take 1 tablet (100 mg total) by mouth every 12 (twelve) hours. 05/19/24   Evonnie Lenis, MD  DULoxetine  (CYMBALTA ) 30 MG capsule Take 1 capsule (30 mg total) by mouth daily. 11/17/19 05/27/27  Ernest Ronal BRAVO, MD  famotidine  (PEPCID ) 20 MG tablet Take 20 mg by mouth 2 (two) times daily.    [provider]  furosemide  (LASIX ) 20 MG tablet Take 1 tablet (20 mg total) by mouth daily.  11/11/14   Withrow, Norleen BROCKS, FNP  glycopyrrolate  (ROBINUL ) 1 MG tablet Take 1 mg by mouth 2 (two) times daily.    [provider]  insulin  aspart (NOVOLOG ) 100 UNIT/ML injection Inject 2-10 Units into the skin 3 (three) times daily before meals. Sliding scale: check sugar before meals <200=0; 200-250=2 units;251-300=4 units;301-350=6 units;351-400= 8 units;>400=10 units and call MD    [provider]  insulin  glargine (LANTUS ) 100 UNIT/ML injection Inject 15 Units into the skin daily. Hold for BS<160    [provider]  levocetirizine (XYZAL ) 5 MG tablet Take 5 mg by mouth daily. 04/28/24   [provider]  levothyroxine  (SYNTHROID ) 100 MCG tablet Take 100 mcg by mouth daily before breakfast.    [provider]  OLANZapine  (ZYPREXA ) 2.5 MG tablet Take 2.5 mg by mouth 2 (two) times daily. 12/29/23   [provider]  Oxcarbazepine  (TRILEPTAL ) 300 MG tablet Take 300 mg by mouth 2 (two) times daily. 12/28/20   [provider]  potassium chloride  SA (KLOR-CON  M) 20 MEQ tablet Take 20 mEq by mouth daily.    [provider]  prazosin  (MINIPRESS ) 1 MG capsule Take 1 mg by mouth at bedtime. 12/28/20   [provider]  SPIRIVA  HANDIHALER 18 MCG CAPS Irrigate with 1 capsule as directed daily. 05/06/24   [provider]  traZODone  (DESYREL ) 100 MG tablet Take 100 mg by mouth at bedtime. 12/29/23   [provider]  trihexyphenidyl (ARTANE) 2 MG tablet Take 2 mg by mouth at bedtime. 12/27/20   [provider]  VRAYLAR  4.5 MG CAPS Take 1 capsule by mouth daily.    [provider]  ziprasidone  (GEODON ) 80 MG capsule Take 80 mg by mouth at bedtime.    [provider]  zolpidem  (AMBIEN ) 5 MG tablet Take 5 mg by mouth at bedtime.    [provider]  fluPHENAZine  (PROLIXIN ) 10 MG tablet Take 1 tablet (10 mg total) by mouth 3 (three) times daily. 12/03/18 11/17/19  Mody, Sital, MD  fluPHENAZine  decanoate (PROLIXIN ) 25 MG/ML injection Inject 50 mg into the muscle every 28 (twenty-eight) days.  11/17/19  [provider]  lurasidone  (LATUDA ) 20 MG TABS tablet Take 20 mg by mouth daily in the afternoon.   11/17/19  [provider]    Allergies: Fish allergy    Review of Systems  Neurological:  Positive for weakness.  All other systems reviewed and are negative.   Updated Vital Signs BP (!) 152/74   Pulse (!) 55   Temp (!) 94.6 F (34.8 C) (Rectal)   Resp 20   Ht 1.626 m (5' 4)   Wt 98 kg   SpO2 92%   BMI 37.09 kg/m   Physical Exam Vitals and nursing note reviewed.  Constitutional:       General: She is not in acute distress.    Appearance: She is well-developed.  HENT:     Head: Normocephalic and atraumatic.     Mouth/Throat:     Pharynx: No oropharyngeal exudate.  Eyes:     General: No scleral icterus.       Right eye: No discharge.        Left eye: No discharge.     Conjunctiva/sclera: Conjunctivae normal.     Pupils: Pupils are equal, round, and reactive to light.  Neck:     Thyroid : No thyromegaly.     Vascular: No JVD.  Cardiovascular:     Rate and Rhythm: Normal  rate and regular rhythm.     Heart sounds: Normal heart sounds. No murmur heard.    No friction rub. No gallop.  Pulmonary:     Effort: Pulmonary effort is normal. No respiratory distress.     Breath sounds: Normal breath sounds. No wheezing or rales.  Abdominal:     General: Bowel sounds are normal. There is no distension.     Palpations: Abdomen is soft. There is no mass.     Tenderness: There is no abdominal tenderness.  Musculoskeletal:        General: No tenderness. Normal range of motion.     Cervical back: Normal range of motion and neck supple.     Right lower leg: Edema present.     Left lower leg: Edema present.     Comments: 1+ pitting edema to the bilateral lower extremities, appears symmetrical.  When I asked the patient to lift her legs she looks at me and laughs, when I try to lift her legs and then I dropped them it is clear that she has some muscle response to try to prevent it from dropping but does not keep it in the air.  When I because of painful stimuli to her bilateral legs she immediately flexes at the knees and bends both of her knees and then laughs at me.  Bilateral upper extremities with totally normal strength and tone  Lymphadenopathy:     Cervical: No cervical adenopathy.  Skin:    General: Skin is warm and dry.     Findings: No erythema or rash.  Neurological:     Mental Status: She is alert.     Coordination: Coordination normal.  Psychiatric:        Behavior:  Behavior normal.     (all labs ordered are listed, but only abnormal results are displayed) Labs Reviewed  URINALYSIS, ROUTINE W REFLEX MICROSCOPIC - Abnormal; Notable for the following components:      Result Value   Color, Urine STRAW (*)    All other components within normal limits  COMPREHENSIVE METABOLIC PANEL WITH GFR - Abnormal; Notable for the following components:   Glucose, Bld 145 (*)    BUN 26 (*)    Creatinine, Ser 1.26 (*)    Alkaline Phosphatase 144 (*)    GFR, Estimated 48 (*)    All other components within normal limits  CBC WITH DIFFERENTIAL/PLATELET - Abnormal; Notable for the following components:   RBC 3.64 (*)    Hemoglobin 11.0 (*)    HCT 35.9 (*)    RDW 15.7 (*)    All other components within normal limits  CBG MONITORING, ED - Abnormal; Notable for the following components:   Glucose-Capillary 142 (*)    All other components within normal limits  MAGNESIUM     EKG: None  Radiology: DG Chest Port 1 View Result Date: 05/30/2024 EXAM: 1 VIEW(S) XRAY OF THE CHEST 05/30/2024 12:28:00 PM COMPARISON: 05/17/2024 CLINICAL HISTORY: recent pna - weak FINDINGS: LUNGS AND PLEURA: Decreased conspicuity of left retrocardiac opacities. No pleural effusion. No pneumothorax. HEART AND MEDIASTINUM: No acute abnormality of the cardiac and mediastinal silhouettes. Aortic calcification. BONES AND SOFT TISSUES: No acute osseous abnormality. IMPRESSION: 1. Decreased conspicuity of left retrocardiac opacities, compared to 05/17/2024. 2. Aortic atherosclerosis. Electronically signed by: Evalene Coho MD 05/30/2024 12:32 PM EST RP Workstation: [id number]     Procedures   Medications Ordered in the ED - No data to display  Medical Decision Making Amount and/or Complexity of Data Reviewed Labs: ordered. Radiology: ordered.   The patient does not appear to have any obvious weakness to the legs.  Given that she was recently in the hospital  we will check a few different test to make sure there is no signs of electrolyte abnormalities including things like hyponatremia or recurrent infections, that being said the patient does not appear to be in any distress she is not tachycardic or hypoxic or febrile.`  Labs:  I  personally viewed and interpreted the labs which show CBC and metabolic panel unremarkable   Radiology Imaging: I personally viewed the images of the ordered radiographic studies and find x-ray shows no acute findings of any abnormalities I agree with the radiologist interpretation as well  After a couple of hours of observation the patient has had normal heart rates blood pressures and mental status.  She is now asking if she can return home, she does not want to be here anymore.  The patient is able to lift both of her legs off of the bed for me and move into range of motion.  I do not see any focal weakness and she has no back pain, no focal neurologic symptoms to suggest that this is a neurologic event, brain event cerebral event spinal event or sepsis.      Final diagnoses:  Generalized weakness    ED Discharge Orders     None          Cleotilde Rogue, MD 05/30/24 1420  "

## 2024-05-30 NOTE — ED Triage Notes (Signed)
 Per ems pt coming from [facility] c/o weakness and unable to walk. Ems report equal grip strength and patient was refusing to ambulate for staff.

## 2024-05-30 NOTE — ED Provider Notes (Signed)
 " [place] EMERGENCY DEPARTMENT AT [facility] Provider Note   CSN: [id number] Arrival date & time: 05/30/24  1631     Patient presents with: Weakness   Carrie Clark is a 64 y.o. female.  Patient is a 64 year old female with a history of schizoaffective disorder, hypothyroidism, hypertension, COPD, and diabetes who presents to the ED via EMS after facility refused to accept patient back this afternoon.  Patient was seen at emergency room earlier today for not wanting to walk.  Paramedics picked patient up at facility this morning where she refused to walk but she had been walking out of facility earlier today.  They noted her affect was odd and she was laughing in the ambulance.  She tells me now her feet are hurting but denies leg or back pain.  States she has not fallen.  No further complaints.  When questioned further, patient states her shoes are hurting her.  When we removed her shoes and placed on hospital socks, she was able to walk with a walker with minimal assistance.  She uses a walker at baseline.    Weakness Associated symptoms: no chest pain and no shortness of breath        Prior to Admission medications  Medication Sig Start Date End Date Taking? Authorizing Provider  acetaminophen  (TYLENOL ) 325 MG tablet Take 650 mg by mouth every 6 (six) hours as needed for moderate pain (pain score 4-6).    [provider]  albuterol  (VENTOLIN  HFA) 108 (90 Base) MCG/ACT inhaler Inhale 1-2 puffs into the lungs every 6 (six) hours as needed for wheezing or shortness of breath. 01/15/24   Suellen, Celeste A, PA-C  atorvastatin  (LIPITOR) 40 MG tablet Take 40 mg by mouth at bedtime.     [provider]  cefdinir  (OMNICEF ) 300 MG capsule Take 1 capsule (300 mg total) by mouth 2 (two) times daily. 05/19/24   Evonnie Lenis, MD  diazepam  (VALIUM ) 10 MG tablet Take 10 mg by mouth 2 (two) times daily.    [provider]  diclofenac  Sodium (VOLTAREN ) 1 % GEL Apply  2 g topically 4 (four) times daily.    [provider]  donepezil  (ARICEPT ) 10 MG tablet Take 10 mg by mouth at bedtime.    [provider]  doxycycline  (VIBRA -TABS) 100 MG tablet Take 1 tablet (100 mg total) by mouth every 12 (twelve) hours. 05/19/24   Evonnie Lenis, MD  DULoxetine  (CYMBALTA ) 30 MG capsule Take 1 capsule (30 mg total) by mouth daily. 11/17/19 05/27/27  Ernest Ronal BRAVO, MD  famotidine  (PEPCID ) 20 MG tablet Take 20 mg by mouth 2 (two) times daily.    [provider]  furosemide  (LASIX ) 20 MG tablet Take 1 tablet (20 mg total) by mouth daily. 11/11/14   Withrow, Norleen BROCKS, FNP  glycopyrrolate  (ROBINUL ) 1 MG tablet Take 1 mg by mouth 2 (two) times daily.    [provider]  insulin  aspart (NOVOLOG ) 100 UNIT/ML injection Inject 2-10 Units into the skin 3 (three) times daily before meals. Sliding scale: check sugar before meals <200=0; 200-250=2 units;251-300=4 units;301-350=6 units;351-400= 8 units;>400=10 units and call MD    [provider]  insulin  glargine (LANTUS ) 100 UNIT/ML injection Inject 15 Units into the skin daily. Hold for BS<160    [provider]  levocetirizine (XYZAL ) 5 MG tablet Take 5 mg by mouth daily. 04/28/24   [provider]  levothyroxine  (SYNTHROID ) 100 MCG tablet Take 100 mcg by mouth daily before breakfast.  [provider]  OLANZapine  (ZYPREXA ) 2.5 MG tablet Take 2.5 mg by mouth 2 (two) times daily. 12/29/23   [provider]  Oxcarbazepine  (TRILEPTAL ) 300 MG tablet Take 300 mg by mouth 2 (two) times daily. 12/28/20   [provider]  potassium chloride  SA (KLOR-CON  M) 20 MEQ tablet Take 20 mEq by mouth daily.    [provider]  prazosin  (MINIPRESS ) 1 MG capsule Take 1 mg by mouth at bedtime. 12/28/20   [provider]  SPIRIVA  HANDIHALER 18 MCG CAPS Irrigate with 1 capsule as directed daily. 05/06/24   [provider]  traZODone  (DESYREL ) 100 MG tablet Take  100 mg by mouth at bedtime. 12/29/23   [provider]  trihexyphenidyl (ARTANE) 2 MG tablet Take 2 mg by mouth at bedtime. 12/27/20   [provider]  VRAYLAR  4.5 MG CAPS Take 1 capsule by mouth daily.    [provider]  ziprasidone  (GEODON ) 80 MG capsule Take 80 mg by mouth at bedtime.    [provider]  zolpidem  (AMBIEN ) 5 MG tablet Take 5 mg by mouth at bedtime.    [provider]  fluPHENAZine  (PROLIXIN ) 10 MG tablet Take 1 tablet (10 mg total) by mouth 3 (three) times daily. 12/03/18 11/17/19  Mody, Sital, MD  fluPHENAZine  decanoate (PROLIXIN ) 25 MG/ML injection Inject 50 mg into the muscle every 28 (twenty-eight) days.  11/17/19  [provider]  lurasidone  (LATUDA ) 20 MG TABS tablet Take 20 mg by mouth daily in the afternoon.   11/17/19  [provider]    Allergies: Fish allergy    Review of Systems  Respiratory:  Negative for shortness of breath.   Cardiovascular:  Negative for chest pain.  Musculoskeletal:  Negative for back pain.  Neurological:  Positive for weakness.  All other systems reviewed and are negative.   Updated Vital Signs Pulse (!) 53   Ht 5' 4 (1.626 m)   Wt 98 kg   BMI 37.09 kg/m   Physical Exam Constitutional:      Appearance: Normal appearance.  HENT:     Head: Normocephalic and atraumatic.     Mouth/Throat:     Mouth: Mucous membranes are moist.     Pharynx: Oropharynx is clear.  Cardiovascular:     Rate and Rhythm: Normal rate.     Pulses: Normal pulses.     Comments: PT pulses 2+ bilaterally Pulmonary:     Effort: Pulmonary effort is normal.  Musculoskeletal:     Comments: Full range of motion of bilateral lower extremities with equal strength.  She moves both legs freely and willingly with no difficulty.  Skin:    General: Skin is warm and dry.  Neurological:     Mental Status: She is alert and oriented to person, place, and time.     (all labs ordered are listed, but only  abnormal results are displayed) Labs Reviewed - No data to display  EKG: None  Radiology: [facility] Chest Port 1 View Result Date: 05/30/2024 EXAM: 1 VIEW(S) XRAY OF THE CHEST 05/30/2024 12:28:00 PM COMPARISON: 05/17/2024 CLINICAL HISTORY: recent pna - weak FINDINGS: LUNGS AND PLEURA: Decreased conspicuity of left retrocardiac opacities. No pleural effusion. No pneumothorax. HEART AND MEDIASTINUM: No acute abnormality of the cardiac and mediastinal silhouettes. Aortic calcification. BONES AND SOFT TISSUES: No acute osseous abnormality. IMPRESSION: 1. Decreased conspicuity of left retrocardiac opacities, compared to 05/17/2024. 2. Aortic atherosclerosis. Electronically signed by: Evalene Coho MD 05/30/2024 12:32 PM EST RP Workstation: [id number]  Medications Ordered in the ED - No data to display                                 Medical Decision Making Patient is a 64 year old female who presents back to the ED after facility refused to accept patient back because EMS states that they were unsure if patient could walk.  On exam patient is alert and in no acute distress.  Physical exam as noted above.  She does have full range of motion of bilateral lower extremities with equal strength.  She is moving legs freely.  On conversation with patient, she states that her feet are hurting.  When we try to stand her up, she is complaining of her she was hurting.  We removed her shoes and placed socks on and she has no further complaints of leg or foot pain.  She was able to walk with a walker with minimal assistance.  Workup from earlier today reviewed and was negative for acute abnormalities.  Much less concerns for vascular or neurologic issue as she has good strength and pulses in the leg.  She is stable for discharge back to facility as she is ambulating at her baseline.  Facility was contacted and will accept patient back.  Patient is stable while in ED and awaiting transport.       Final  diagnoses:  Pain in both feet    ED Discharge Orders     None          Neysa Thersia GORMAN DEVONNA 05/30/24 1826    Francesca Elsie CROME, MD 05/30/24 2333  "

## 2024-06-02 ENCOUNTER — Emergency Department (HOSPITAL_COMMUNITY)

## 2024-06-02 ENCOUNTER — Inpatient Hospital Stay (HOSPITAL_COMMUNITY)
Admission: EM | Admit: 2024-06-02 | Discharge: 2024-06-16 | DRG: 689 | Disposition: A | Discharge status: Skilled Nursing Facility | Source: Skilled Nursing Facility | Attending: Family Medicine | Admitting: Family Medicine

## 2024-06-02 DIAGNOSIS — F250 Schizoaffective disorder, bipolar type: Secondary | ICD-10-CM | POA: Diagnosis present

## 2024-06-02 DIAGNOSIS — Z79890 Hormone replacement therapy (postmenopausal): Secondary | ICD-10-CM

## 2024-06-02 DIAGNOSIS — E039 Hypothyroidism, unspecified: Secondary | ICD-10-CM | POA: Diagnosis present

## 2024-06-02 DIAGNOSIS — I129 Hypertensive chronic kidney disease with stage 1 through stage 4 chronic kidney disease, or unspecified chronic kidney disease: Secondary | ICD-10-CM | POA: Diagnosis present

## 2024-06-02 DIAGNOSIS — R680 Hypothermia, not associated with low environmental temperature: Secondary | ICD-10-CM | POA: Diagnosis present

## 2024-06-02 DIAGNOSIS — Z91013 Allergy to seafood: Secondary | ICD-10-CM

## 2024-06-02 DIAGNOSIS — N390 Urinary tract infection, site not specified: Secondary | ICD-10-CM | POA: Diagnosis present

## 2024-06-02 DIAGNOSIS — Z9981 Dependence on supplemental oxygen: Secondary | ICD-10-CM

## 2024-06-02 DIAGNOSIS — Z794 Long term (current) use of insulin: Secondary | ICD-10-CM

## 2024-06-02 DIAGNOSIS — E1122 Type 2 diabetes mellitus with diabetic chronic kidney disease: Secondary | ICD-10-CM | POA: Diagnosis present

## 2024-06-02 DIAGNOSIS — Z87891 Personal history of nicotine dependence: Secondary | ICD-10-CM

## 2024-06-02 DIAGNOSIS — Z6837 Body mass index (BMI) 37.0-37.9, adult: Secondary | ICD-10-CM

## 2024-06-02 DIAGNOSIS — D631 Anemia in chronic kidney disease: Secondary | ICD-10-CM | POA: Diagnosis present

## 2024-06-02 DIAGNOSIS — G9341 Metabolic encephalopathy: Secondary | ICD-10-CM | POA: Diagnosis present

## 2024-06-02 DIAGNOSIS — N3000 Acute cystitis without hematuria: Principal | ICD-10-CM | POA: Diagnosis present

## 2024-06-02 DIAGNOSIS — R4182 Altered mental status, unspecified: Principal | ICD-10-CM

## 2024-06-02 DIAGNOSIS — E66812 Obesity, class 2: Secondary | ICD-10-CM | POA: Diagnosis present

## 2024-06-02 DIAGNOSIS — I959 Hypotension, unspecified: Secondary | ICD-10-CM | POA: Diagnosis present

## 2024-06-02 DIAGNOSIS — J449 Chronic obstructive pulmonary disease, unspecified: Secondary | ICD-10-CM | POA: Diagnosis present

## 2024-06-02 DIAGNOSIS — E782 Mixed hyperlipidemia: Secondary | ICD-10-CM | POA: Diagnosis present

## 2024-06-02 DIAGNOSIS — Z8673 Personal history of transient ischemic attack (TIA), and cerebral infarction without residual deficits: Secondary | ICD-10-CM

## 2024-06-02 DIAGNOSIS — Z87440 Personal history of urinary (tract) infections: Secondary | ICD-10-CM

## 2024-06-02 DIAGNOSIS — N1832 Chronic kidney disease, stage 3b: Secondary | ICD-10-CM | POA: Diagnosis present

## 2024-06-02 DIAGNOSIS — F419 Anxiety disorder, unspecified: Secondary | ICD-10-CM | POA: Diagnosis present

## 2024-06-02 DIAGNOSIS — E0822 Diabetes mellitus due to underlying condition with diabetic chronic kidney disease: Secondary | ICD-10-CM

## 2024-06-02 DIAGNOSIS — Z79899 Other long term (current) drug therapy: Secondary | ICD-10-CM

## 2024-06-02 DIAGNOSIS — T68XXXA Hypothermia, initial encounter: Secondary | ICD-10-CM

## 2024-06-02 LAB — BLOOD GAS, VENOUS
Acid-Base Excess: 13.4 mmol/L — ABNORMAL HIGH (ref 0.0–2.0)
Bicarbonate: 42.4 mmol/L — ABNORMAL HIGH (ref 20.0–28.0)
Drawn by: 71744
O2 Saturation: 91.3 %
Patient temperature: 33.4
pCO2, Ven: 64 mmHg — ABNORMAL HIGH (ref 44–60)
pH, Ven: 7.41 (ref 7.25–7.43)
pO2, Ven: 48 mmHg — ABNORMAL HIGH (ref 32–45)

## 2024-06-02 LAB — DIC (DISSEMINATED INTRAVASCULAR COAGULATION)PANEL
D-Dimer, Quant: 0.98 ug{FEU}/mL — ABNORMAL HIGH (ref 0.00–0.50)
Fibrinogen: 406 mg/dL (ref 210–475)
INR: 0.9 (ref 0.8–1.2)
Platelets: 146 K/uL — ABNORMAL LOW (ref 150–400)
Prothrombin Time: 12.6 s (ref 11.4–15.2)
Smear Review: NONE SEEN
aPTT: 28 s (ref 24–36)

## 2024-06-02 LAB — CBC WITH DIFFERENTIAL/PLATELET
Abs Immature Granulocytes: 0.01 K/uL (ref 0.00–0.07)
Basophils Absolute: 0 K/uL (ref 0.0–0.1)
Basophils Relative: 0 %
Eosinophils Absolute: 0.2 K/uL (ref 0.0–0.5)
Eosinophils Relative: 4 %
HCT: 35.4 % — ABNORMAL LOW (ref 36.0–46.0)
Hemoglobin: 10.8 g/dL — ABNORMAL LOW (ref 12.0–15.0)
Immature Granulocytes: 0 %
Lymphocytes Relative: 24 %
Lymphs Abs: 1.3 K/uL (ref 0.7–4.0)
MCH: 29.9 pg (ref 26.0–34.0)
MCHC: 30.5 g/dL (ref 30.0–36.0)
MCV: 98.1 fL (ref 80.0–100.0)
Monocytes Absolute: 0.5 K/uL (ref 0.1–1.0)
Monocytes Relative: 8 %
Neutro Abs: 3.5 K/uL (ref 1.7–7.7)
Neutrophils Relative %: 64 %
Platelets: 136 K/uL — ABNORMAL LOW (ref 150–400)
RBC: 3.61 MIL/uL — ABNORMAL LOW (ref 3.87–5.11)
RDW: 15.9 % — ABNORMAL HIGH (ref 11.5–15.5)
WBC: 5.6 K/uL (ref 4.0–10.5)
nRBC: 0 % (ref 0.0–0.2)

## 2024-06-02 LAB — HEPATIC FUNCTION PANEL
ALT: 16 U/L (ref 0–44)
AST: 24 U/L (ref 15–41)
Albumin: 3.6 g/dL (ref 3.5–5.0)
Alkaline Phosphatase: 140 U/L — ABNORMAL HIGH (ref 38–126)
Bilirubin, Direct: 0.1 mg/dL (ref 0.0–0.2)
Indirect Bilirubin: 0.1 mg/dL — ABNORMAL LOW (ref 0.3–0.9)
Total Bilirubin: 0.2 mg/dL (ref 0.0–1.2)
Total Protein: 6.1 g/dL — ABNORMAL LOW (ref 6.5–8.1)

## 2024-06-02 LAB — BASIC METABOLIC PANEL WITH GFR
Anion gap: 2 — ABNORMAL LOW (ref 5–15)
BUN: 37 mg/dL — ABNORMAL HIGH (ref 8–23)
CO2: 41 mmol/L — ABNORMAL HIGH (ref 22–32)
Calcium: 9.5 mg/dL (ref 8.9–10.3)
Chloride: 99 mmol/L (ref 98–111)
Creatinine, Ser: 1.45 mg/dL — ABNORMAL HIGH (ref 0.44–1.00)
GFR, Estimated: 41 mL/min — ABNORMAL LOW
Glucose, Bld: 148 mg/dL — ABNORMAL HIGH (ref 70–99)
Potassium: 4.5 mmol/L (ref 3.5–5.1)
Sodium: 143 mmol/L (ref 135–145)

## 2024-06-02 LAB — PRO BRAIN NATRIURETIC PEPTIDE: Pro Brain Natriuretic Peptide: 138 pg/mL

## 2024-06-02 LAB — AMMONIA: Ammonia: 29 umol/L (ref 9–35)

## 2024-06-02 LAB — ETHANOL: Alcohol, Ethyl (B): <15 mg/dL

## 2024-06-02 LAB — LACTIC ACID, PLASMA: Lactic Acid, Venous: 1.4 mmol/L (ref 0.5–1.9)

## 2024-06-02 LAB — CBG MONITORING, ED: Glucose-Capillary: 195 mg/dL — ABNORMAL HIGH (ref 70–99)

## 2024-06-02 LAB — MAGNESIUM: Magnesium: 2 mg/dL (ref 1.7–2.4)

## 2024-06-02 MED ORDER — LACTATED RINGERS IV BOLUS
1000.0000 mL | Freq: Once | INTRAVENOUS | Status: AC
  Administered 2024-06-02: 1000 mL via INTRAVENOUS

## 2024-06-02 MED ORDER — SODIUM CHLORIDE 0.9 % IV SOLN
2.0000 g | Freq: Once | INTRAVENOUS | Status: AC
  Administered 2024-06-02: 2 g via INTRAVENOUS
  Filled 2024-06-02: qty 12.5

## 2024-06-02 MED ORDER — LACTATED RINGERS IV BOLUS
1500.0000 mL | Freq: Once | INTRAVENOUS | Status: AC
  Administered 2024-06-03: 1500 mL via INTRAVENOUS

## 2024-06-02 MED ORDER — LACTATED RINGERS IV BOLUS: 1000.0000 mL | Freq: Once | INTRAVENOUS | Status: DC

## 2024-06-02 MED ORDER — NALOXONE HCL 0.4 MG/ML IJ SOLN
0.4000 mg | Freq: Once | INTRAMUSCULAR | Status: AC
  Administered 2024-06-02: 0.4 mg via INTRAVENOUS
  Filled 2024-06-02: qty 1

## 2024-06-02 MED ORDER — VANCOMYCIN HCL IN DEXTROSE 1-5 GM/200ML-% IV SOLN
1000.0000 mg | Freq: Once | INTRAVENOUS | Status: AC
  Administered 2024-06-03: 1000 mg via INTRAVENOUS
  Filled 2024-06-02: qty 200

## 2024-06-02 MED ORDER — NOREPINEPHRINE 4 MG/250ML-% IV SOLN
0.0000 ug/min | INTRAVENOUS | Status: DC
  Administered 2024-06-02: 2 ug/min via INTRAVENOUS
  Filled 2024-06-02: qty 250

## 2024-06-02 MED ORDER — HYDROCORTISONE SOD SUC (PF) 100 MG IJ SOLR
100.0000 mg | Freq: Once | INTRAMUSCULAR | Status: AC
  Administered 2024-06-02: 100 mg via INTRAVENOUS
  Filled 2024-06-02: qty 2

## 2024-06-02 MED ORDER — METRONIDAZOLE 500 MG/100ML IV SOLN
500.0000 mg | Freq: Once | INTRAVENOUS | Status: AC
  Administered 2024-06-02: 500 mg via INTRAVENOUS
  Filled 2024-06-02: qty 100

## 2024-06-02 NOTE — ED Triage Notes (Signed)
 [facility] from [facility]. Ems was called out for respiration problems but when ems arrived patient was breathing normally.  Patient is more so altered. Pupils are pinpoint. Able to be aroused with verbal stimuli  On 2l all the time with [place]

## 2024-06-02 NOTE — ED Provider Notes (Signed)
 " [place] [facility] AT [facility] Provider Note   CSN: [id number] Arrival date & time: 06/02/24  1931     Patient presents with: Altered Mental Status   Carrie Clark is a 64 y.o. female.    Altered Mental Status Patient presents for altered mental status.  Medical history includes COPD, CVA, HTN, schizophrenia.  EMS was initially called out for breathing difficulty.  When they arrived, breathing was unlabored.  Facility reported concern of altered mental status.  Per chart review, medication list includes Valium , Cymbalta , insulin , Synthroid , Geodon , Ambien .  Patient unable to provide any history at this time.     Prior to Admission medications  Medication Sig Start Date End Date Taking? Authorizing Provider  acetaminophen  (TYLENOL ) 325 MG tablet Take 650 mg by mouth every 6 (six) hours as needed for moderate pain (pain score 4-6).    [provider]  albuterol  (VENTOLIN  HFA) 108 (90 Base) MCG/ACT inhaler Inhale 1-2 puffs into the lungs every 6 (six) hours as needed for wheezing or shortness of breath. 01/15/24   Suellen, Celeste A, PA-C  atorvastatin  (LIPITOR) 40 MG tablet Take 40 mg by mouth at bedtime.     [provider]  cefdinir  (OMNICEF ) 300 MG capsule Take 1 capsule (300 mg total) by mouth 2 (two) times daily. 05/19/24   Evonnie Lenis, MD  diazepam  (VALIUM ) 10 MG tablet Take 10 mg by mouth 2 (two) times daily.    [provider]  diclofenac  Sodium (VOLTAREN ) 1 % GEL Apply 2 g topically 4 (four) times daily.    [provider]  donepezil  (ARICEPT ) 10 MG tablet Take 10 mg by mouth at bedtime.    [provider]  doxycycline  (VIBRA -TABS) 100 MG tablet Take 1 tablet (100 mg total) by mouth every 12 (twelve) hours. 05/19/24   Evonnie Lenis, MD  DULoxetine  (CYMBALTA ) 30 MG capsule Take 1 capsule (30 mg total) by mouth daily. 11/17/19 05/27/27  Ernest Ronal BRAVO, MD  famotidine  (PEPCID ) 20 MG tablet Take 20 mg by mouth 2 (two) times  daily.    [provider]  furosemide  (LASIX ) 20 MG tablet Take 1 tablet (20 mg total) by mouth daily. 11/11/14   Withrow, Norleen BROCKS, FNP  glycopyrrolate  (ROBINUL ) 1 MG tablet Take 1 mg by mouth 2 (two) times daily.    [provider]  insulin  aspart (NOVOLOG ) 100 UNIT/ML injection Inject 2-10 Units into the skin 3 (three) times daily before meals. Sliding scale: check sugar before meals <200=0; 200-250=2 units;251-300=4 units;301-350=6 units;351-400= 8 units;>400=10 units and call MD    [provider]  insulin  glargine (LANTUS ) 100 UNIT/ML injection Inject 15 Units into the skin daily. Hold for BS<160    [provider]  levocetirizine (XYZAL ) 5 MG tablet Take 5 mg by mouth daily. 04/28/24   [provider]  levothyroxine  (SYNTHROID ) 100 MCG tablet Take 100 mcg by mouth daily before breakfast.    [provider]  OLANZapine  (ZYPREXA ) 2.5 MG tablet Take 2.5 mg by mouth 2 (two) times daily. 12/29/23   [provider]  Oxcarbazepine  (TRILEPTAL ) 300 MG tablet Take 300 mg by mouth 2 (two) times daily. 12/28/20   [provider]  potassium chloride  SA (KLOR-CON  M) 20 MEQ tablet Take 20 mEq by mouth daily.    [provider]  prazosin  (MINIPRESS ) 1 MG capsule Take 1 mg by mouth at bedtime. 12/28/20   [provider]  SPIRIVA  HANDIHALER 18 MCG CAPS Irrigate with 1 capsule  as directed daily. 05/06/24   [provider]  traZODone  (DESYREL ) 100 MG tablet Take 100 mg by mouth at bedtime. 12/29/23   [provider]  trihexyphenidyl (ARTANE) 2 MG tablet Take 2 mg by mouth at bedtime. 12/27/20   [provider]  VRAYLAR  4.5 MG CAPS Take 1 capsule by mouth daily.    [provider]  ziprasidone  (GEODON ) 80 MG capsule Take 80 mg by mouth at bedtime.    [provider]  zolpidem  (AMBIEN ) 5 MG tablet Take 5 mg by mouth at bedtime.    [provider]  fluPHENAZine  (PROLIXIN ) 10 MG tablet  Take 1 tablet (10 mg total) by mouth 3 (three) times daily. 12/03/18 11/17/19  Mody, Sital, MD  fluPHENAZine  decanoate (PROLIXIN ) 25 MG/ML injection Inject 50 mg into the muscle every 28 (twenty-eight) days.  11/17/19  [provider]  lurasidone  (LATUDA ) 20 MG TABS tablet Take 20 mg by mouth daily in the afternoon.   11/17/19  [provider]    Allergies: Fish allergy    Review of Systems  Unable to perform ROS: Mental status change    Updated Vital Signs BP (!) 111/48   Pulse 65   Temp (!) 93 F (33.9 C) (Rectal)   Resp 13   SpO2 94%   Physical Exam Vitals and nursing note reviewed.  Constitutional:      General: She is not in acute distress.    Appearance: Normal appearance. She is well-developed. She is not ill-appearing, toxic-appearing or diaphoretic.  HENT:     Head: Normocephalic and atraumatic.     Right Ear: External ear normal.     Left Ear: External ear normal.     Nose: Nose normal.     Mouth/Throat:     Mouth: Mucous membranes are moist.  Eyes:     Extraocular Movements: Extraocular movements intact.     Conjunctiva/sclera: Conjunctivae normal.  Cardiovascular:     Rate and Rhythm: Normal rate and regular rhythm.     Heart sounds: No murmur heard. Pulmonary:     Effort: Pulmonary effort is normal. No respiratory distress.     Breath sounds: Decreased breath sounds present.  Abdominal:     General: There is no distension.     Palpations: Abdomen is soft.     Tenderness: There is no abdominal tenderness.  Musculoskeletal:        General: No swelling or deformity.     Cervical back: Normal range of motion and neck supple.  Skin:    General: Skin is warm and dry.     Coloration: Skin is not jaundiced or pale.  Neurological:     Mental Status: She is alert.     GCS: GCS eye subscore is 4. GCS verbal subscore is 4. GCS motor subscore is 6.  Psychiatric:        Mood and Affect: Mood normal.        Behavior: Behavior normal.     (all labs  ordered are listed, but only abnormal results are displayed) Labs Reviewed  CBC WITH DIFFERENTIAL/PLATELET - Abnormal; Notable for the following components:      Result Value   RBC 3.61 (*)    Hemoglobin 10.8 (*)    HCT 35.4 (*)    RDW 15.9 (*)    Platelets 136 (*)    All other components within normal limits  BASIC METABOLIC PANEL WITH GFR - Abnormal; Notable for the following components:   CO2 41 (*)  Glucose, Bld 148 (*)    BUN 37 (*)    Creatinine, Ser 1.45 (*)    GFR, Estimated 41 (*)    Anion gap 2 (*)    All other components within normal limits  BLOOD GAS, VENOUS - Abnormal; Notable for the following components:   pCO2, Ven 64 (*)    pO2, Ven 48 (*)    Bicarbonate 42.4 (*)    Acid-Base Excess 13.4 (*)    All other components within normal limits  HEPATIC FUNCTION PANEL - Abnormal; Notable for the following components:   Total Protein 6.1 (*)    Alkaline Phosphatase 140 (*)    Indirect Bilirubin 0.1 (*)    All other components within normal limits  DIC (DISSEMINATED INTRAVASCULAR COAGULATION)PANEL - Abnormal; Notable for the following components:   D-Dimer, Quant 0.98 (*)    Platelets 146 (*)    All other components within normal limits  CBG MONITORING, ED - Abnormal; Notable for the following components:   Glucose-Capillary 195 (*)    All other components within normal limits  CULTURE, BLOOD (ROUTINE X 2)  CULTURE, BLOOD (ROUTINE X 2)  AMMONIA  ETHANOL  MAGNESIUM   PRO BRAIN NATRIURETIC PEPTIDE  LACTIC ACID, PLASMA  URINALYSIS, ROUTINE W REFLEX MICROSCOPIC  URINE DRUG SCREEN  LACTIC ACID, PLASMA    EKG: None  Radiology: CT CHEST ABDOMEN PELVIS WO CONTRAST Result Date: 06/02/2024 CLINICAL DATA:  Sepsis EXAM: CT CHEST, ABDOMEN AND PELVIS WITHOUT CONTRAST TECHNIQUE: Multidetector CT imaging of the chest, abdomen and pelvis was performed following the standard protocol without IV contrast. RADIATION DOSE REDUCTION: This exam was performed according to the  departmental dose-optimization program which includes automated exposure control, adjustment of the mA and/or kV according to patient size and/or use of iterative reconstruction technique. COMPARISON:  Chest x-ray 06/02/2024, CT 01/15/2024 FINDINGS: CT CHEST FINDINGS Cardiovascular: Limited without intravenous contrast. Mild aortic atherosclerosis. No aneurysm. Mild cardiomegaly. No pericardial effusion Mediastinum/Nodes: Patent trachea. No thyroid  mass. No suspicious lymph nodes. Esophagus within normal limits. Lungs/Pleura: No pleural effusion or pneumothorax. Mild mosaic density within the bilateral lungs. Bandlike densities at the right middle lobe, lingula and lower lobes, favored to represent atelectasis. No consolidative airspace disease. Musculoskeletal: Sternum appears intact. No acute osseous abnormality CT ABDOMEN PELVIS FINDINGS Hepatobiliary: No focal liver abnormality is seen. No gallstones, gallbladder wall thickening, or biliary dilatation. Pancreas: Unremarkable. No pancreatic ductal dilatation or surrounding inflammatory changes. Spleen: Normal in size without focal abnormality. Adrenals/Urinary Tract: Adrenal glands are stable in appearance with left 2 cm nodule consistent with adenoma. Small nonobstructing left kidney stone. Distended urinary bladder with moderate air. Lobulated bladder contour with bladder diverticula. Stomach/Bowel: Stomach within normal limits. No dilated small bowel. No acute bowel wall thickening. Vascular/Lymphatic: Aortic atherosclerosis. No enlarged abdominal or pelvic lymph nodes. Reproductive: Uterus and bilateral adnexa are unremarkable. Other: No ascites or free air Musculoskeletal: No acute or suspicious osseous abnormality IMPRESSION: 1. Mild mosaic density within the bilateral lungs, nonspecific, but can be seen with small airways disease. Bandlike densities at the right middle lobe, lingula and lower lobes, favored to represent atelectasis. 2. Distended urinary  bladder with moderate air. Correlate for recent instrumentation, if no history of this, consider gas-forming UTI. Lobulated bladder contour with bladder diverticula. 3. Small nonobstructing left kidney stone. 4. Aortic atherosclerosis. Aortic Atherosclerosis (ICD10-I70.0). Electronically Signed   By: Luke Bun M.D.   On: 06/02/2024 23:18   CT HEAD WO CONTRAST Result Date: 06/02/2024 CLINICAL DATA:  Altered mental status.  EXAM: CT HEAD WITHOUT CONTRAST TECHNIQUE: Contiguous axial images were obtained from the base of the skull through the vertex without intravenous contrast. RADIATION DOSE REDUCTION: This exam was performed according to the departmental dose-optimization program which includes automated exposure control, adjustment of the mA and/or kV according to patient size and/or use of iterative reconstruction technique. COMPARISON:  November 19, 2022 FINDINGS: Brain: No evidence of acute infarction, hemorrhage, hydrocephalus, extra-axial collection or mass lesion/mass effect. Vascular: No hyperdense vessel or unexpected calcification. Skull: Normal. Negative for fracture or focal lesion. Sinuses/Orbits: No acute finding. Other: None. IMPRESSION: No acute intracranial pathology. Electronically Signed   By: Suzen Dials M.D.   On: 06/02/2024 21:15   DG Chest Port 1 View Result Date: 06/02/2024 EXAM: 1 VIEW(S) XRAY OF THE CHEST 06/02/2024 08:13:00 PM COMPARISON: 05/30/2024 CLINICAL HISTORY: Altered altered. FINDINGS: LUNGS AND PLEURA: Vascular congestion and interstitial prominence, which could reflect interstitial edema. No focal pulmonary opacity. No pleural effusion. No pneumothorax. HEART AND MEDIASTINUM: Cardiomegaly. BONES AND SOFT TISSUES: No acute osseous abnormality. IMPRESSION: 1. Cardiomegaly. 2. Vascular congestion and interstitial prominence, which could reflect interstitial edema. Electronically signed by: Franky Crease MD MD 06/02/2024 08:21 PM EST RP Workstation: [id number]      Procedures   Medications Ordered in the ED  metroNIDAZOLE  (FLAGYL ) IVPB 500 mg (500 mg Intravenous New Bag/Given 06/02/24 2316)  vancomycin  (VANCOCIN ) IVPB 1000 mg/200 mL premix (has no administration in time range)  norepinephrine  (LEVOPHED ) 4mg  in (0.016 mg/mL) premix infusion (2 mcg/min Intravenous New Bag/Given 06/02/24 2143)  lactated ringers  bolus 1,500 mL (has no administration in time range)  naloxone  (NARCAN ) injection 0.4 mg (0.4 mg Intravenous Given 06/02/24 2038)  lactated ringers  bolus 1,000 mL (1,000 mLs Intravenous New Bag/Given 06/02/24 2202)  ceFEPIme  (MAXIPIME ) 2 g in sodium chloride  0.9 % 100 mL IVPB (0 g Intravenous Stopped 06/02/24 2306)  hydrocortisone  sodium succinate  (SOLU-CORTEF ) 100 MG injection 100 mg (100 mg Intravenous Given 06/02/24 2210)                                    Medical Decision Making Amount and/or Complexity of Data Reviewed Labs: ordered. Radiology: ordered.  Risk Prescription drug management.   This patient presents to the ED for concern of altered mental status, this involves an extensive number of treatment options, and is a complaint that carries with it a high risk of complications and morbidity.  The differential diagnosis includes polypharmacy, seizure, CVA, sepsis, metabolic derangements   Co morbidities / Chronic conditions that complicate the patient evaluation  COPD, CVA, HTN, schizophrenia   Additional history obtained:  Additional history obtained from EMR External records from outside source obtained and reviewed including EMS   Lab Tests:  I Ordered, and personally interpreted labs.  The pertinent results include: Baseline hemoglobin, no leukocytosis, creatinine slightly increased from baseline, normal electrolytes   Imaging Studies ordered:  I ordered imaging studies including chest x-ray, CT head, CT of chest, abdomen, pelvis I independently visualized and interpreted imaging which showed (pending at time of  signout) I agree with the radiologist interpretation   Cardiac Monitoring: / EKG:  The patient was maintained on a cardiac monitor.  I personally viewed and interpreted the cardiac monitored which showed an underlying rhythm of: Sinus rhythm   Problem List / ED Course / Critical interventions / Medication management  Patient presenting for altered mental status.  On arrival, patient is confused and  somnolent.  She has pinpoint pupils.  She is on 2 L of oxygen which is reportedly her baseline.  She does have diminished breath sounds on lung auscultation.  Per chart review, she was seen in the ED on 2 separate occasions 3 days ago.  There was a report of not wanting to walk at the time.  She was unable to walk in the ED.  It is unclear if she is on any narcotic pain medication.  Based on exam, we will trial Narcan .  Workup was initiated.  Patient had no improvement with Narcan .  Rectal temperature showed hypothermia with a temperature of 92.1 degrees.  Warming blanket was applied.  Septic workup and treatment were initiated.  Prior to fluids being started, patient did have drop in blood pressure.  Stress dose steroid and Levophed  were ordered.  Patient was put on 3 mcg/min of Levophed .  She did have improvement in blood pressure with IV fluids.  Plan will be to wean down Levophed  if possible.  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  Temperature sensing Foley catheter was ordered.  Official read of CT imaging pending at time of signout.  Care of patient signed out to oncoming ED provider. I ordered medication including IV fluids and broad-spectrum antibiotics for empiric treatment of sepsis; stress dose steroid for hypothermia, Levophed  for hypotension Reevaluation of the patient after these medicines showed that the patient improved I have reviewed the patients home medicines and have made adjustments as needed  Social Determinants of Health:  Resides in nursing  facility  CRITICAL CARE Performed by: Bernardino Fireman   Total critical care time: 34 minutes  Critical care time was exclusive of separately billable procedures and treating other patients.  Critical care was necessary to treat or prevent imminent or life-threatening deterioration.  Critical care was time spent personally by me on the following activities: development of treatment plan with patient and/or surrogate as well as nursing, discussions with consultants, evaluation of patient's response to treatment, examination of patient, obtaining history from patient or surrogate, ordering and performing treatments and interventions, ordering and review of laboratory studies, ordering and review of radiographic studies, pulse oximetry and re-evaluation of patient's condition.       Final diagnoses:  Altered mental status, unspecified altered mental status type  Hypothermia, initial encounter  Hypotension, unspecified hypotension type    ED Discharge Orders     None          Fireman Bernardino, MD 06/02/24 2324  "

## 2024-06-03 ENCOUNTER — Encounter (HOSPITAL_COMMUNITY): Payer: Self-pay | Admitting: Internal Medicine

## 2024-06-03 ENCOUNTER — Inpatient Hospital Stay (HOSPITAL_COMMUNITY)

## 2024-06-03 DIAGNOSIS — N390 Urinary tract infection, site not specified: Secondary | ICD-10-CM

## 2024-06-03 DIAGNOSIS — J449 Chronic obstructive pulmonary disease, unspecified: Secondary | ICD-10-CM | POA: Diagnosis present

## 2024-06-03 DIAGNOSIS — Z87891 Personal history of nicotine dependence: Secondary | ICD-10-CM | POA: Diagnosis not present

## 2024-06-03 DIAGNOSIS — G9341 Metabolic encephalopathy: Secondary | ICD-10-CM

## 2024-06-03 DIAGNOSIS — T68XXXA Hypothermia, initial encounter: Secondary | ICD-10-CM | POA: Diagnosis not present

## 2024-06-03 DIAGNOSIS — N183 Chronic kidney disease, stage 3 unspecified: Secondary | ICD-10-CM | POA: Diagnosis not present

## 2024-06-03 DIAGNOSIS — Z6837 Body mass index (BMI) 37.0-37.9, adult: Secondary | ICD-10-CM | POA: Diagnosis not present

## 2024-06-03 DIAGNOSIS — Z794 Long term (current) use of insulin: Secondary | ICD-10-CM | POA: Diagnosis not present

## 2024-06-03 DIAGNOSIS — D631 Anemia in chronic kidney disease: Secondary | ICD-10-CM | POA: Diagnosis present

## 2024-06-03 DIAGNOSIS — E039 Hypothyroidism, unspecified: Secondary | ICD-10-CM

## 2024-06-03 DIAGNOSIS — Z91013 Allergy to seafood: Secondary | ICD-10-CM | POA: Diagnosis not present

## 2024-06-03 DIAGNOSIS — Z8673 Personal history of transient ischemic attack (TIA), and cerebral infarction without residual deficits: Secondary | ICD-10-CM | POA: Diagnosis not present

## 2024-06-03 DIAGNOSIS — F419 Anxiety disorder, unspecified: Secondary | ICD-10-CM | POA: Diagnosis present

## 2024-06-03 DIAGNOSIS — Z79899 Other long term (current) drug therapy: Secondary | ICD-10-CM | POA: Diagnosis not present

## 2024-06-03 DIAGNOSIS — E1122 Type 2 diabetes mellitus with diabetic chronic kidney disease: Secondary | ICD-10-CM | POA: Diagnosis present

## 2024-06-03 DIAGNOSIS — E66812 Obesity, class 2: Secondary | ICD-10-CM

## 2024-06-03 DIAGNOSIS — N3000 Acute cystitis without hematuria: Secondary | ICD-10-CM | POA: Diagnosis present

## 2024-06-03 DIAGNOSIS — E782 Mixed hyperlipidemia: Secondary | ICD-10-CM | POA: Diagnosis present

## 2024-06-03 DIAGNOSIS — Z87440 Personal history of urinary (tract) infections: Secondary | ICD-10-CM | POA: Diagnosis not present

## 2024-06-03 DIAGNOSIS — I959 Hypotension, unspecified: Secondary | ICD-10-CM

## 2024-06-03 DIAGNOSIS — R680 Hypothermia, not associated with low environmental temperature: Secondary | ICD-10-CM | POA: Diagnosis present

## 2024-06-03 DIAGNOSIS — Z79890 Hormone replacement therapy (postmenopausal): Secondary | ICD-10-CM | POA: Diagnosis not present

## 2024-06-03 DIAGNOSIS — Z9981 Dependence on supplemental oxygen: Secondary | ICD-10-CM | POA: Diagnosis not present

## 2024-06-03 DIAGNOSIS — N1832 Chronic kidney disease, stage 3b: Secondary | ICD-10-CM | POA: Diagnosis present

## 2024-06-03 DIAGNOSIS — I129 Hypertensive chronic kidney disease with stage 1 through stage 4 chronic kidney disease, or unspecified chronic kidney disease: Secondary | ICD-10-CM | POA: Diagnosis present

## 2024-06-03 DIAGNOSIS — F250 Schizoaffective disorder, bipolar type: Secondary | ICD-10-CM | POA: Diagnosis present

## 2024-06-03 LAB — GLUCOSE, CAPILLARY
Glucose-Capillary: 108 mg/dL — ABNORMAL HIGH (ref 70–99)
Glucose-Capillary: 347 mg/dL — ABNORMAL HIGH (ref 70–99)

## 2024-06-03 LAB — URINE DRUG SCREEN
Amphetamines: NEGATIVE
Barbiturates: NEGATIVE
Benzodiazepines: POSITIVE — AB
Cocaine: NEGATIVE
Fentanyl: NEGATIVE
Methadone Scn, Ur: NEGATIVE
Opiates: NEGATIVE
Tetrahydrocannabinol: NEGATIVE

## 2024-06-03 LAB — CBC
HCT: 34.5 % — ABNORMAL LOW (ref 36.0–46.0)
Hemoglobin: 10.8 g/dL — ABNORMAL LOW (ref 12.0–15.0)
MCH: 30.6 pg (ref 26.0–34.0)
MCHC: 31.3 g/dL (ref 30.0–36.0)
MCV: 97.7 fL (ref 80.0–100.0)
Platelets: 189 K/uL (ref 150–400)
RBC: 3.53 MIL/uL — ABNORMAL LOW (ref 3.87–5.11)
RDW: 16.2 % — ABNORMAL HIGH (ref 11.5–15.5)
WBC: 5.3 K/uL (ref 4.0–10.5)
nRBC: 0 % (ref 0.0–0.2)

## 2024-06-03 LAB — COMPREHENSIVE METABOLIC PANEL WITH GFR
ALT: 18 U/L (ref 0–44)
AST: 25 U/L (ref 15–41)
Albumin: 3.5 g/dL (ref 3.5–5.0)
Alkaline Phosphatase: 141 U/L — ABNORMAL HIGH (ref 38–126)
Anion gap: 0 — ABNORMAL LOW (ref 5–15)
BUN: 34 mg/dL — ABNORMAL HIGH (ref 8–23)
CO2: 40 mmol/L — ABNORMAL HIGH (ref 22–32)
Calcium: 9.1 mg/dL (ref 8.9–10.3)
Chloride: 100 mmol/L (ref 98–111)
Creatinine, Ser: 1.25 mg/dL — ABNORMAL HIGH (ref 0.44–1.00)
GFR, Estimated: 48 mL/min — ABNORMAL LOW
Glucose, Bld: 186 mg/dL — ABNORMAL HIGH (ref 70–99)
Potassium: 5 mmol/L (ref 3.5–5.1)
Sodium: 140 mmol/L (ref 135–145)
Total Bilirubin: 0.3 mg/dL (ref 0.0–1.2)
Total Protein: 5.8 g/dL — ABNORMAL LOW (ref 6.5–8.1)

## 2024-06-03 LAB — PHOSPHORUS: Phosphorus: 3.5 mg/dL (ref 2.5–4.6)

## 2024-06-03 LAB — URINALYSIS, ROUTINE W REFLEX MICROSCOPIC
Bilirubin Urine: NEGATIVE
Glucose, UA: NEGATIVE mg/dL
Ketones, ur: NEGATIVE mg/dL
Nitrite: NEGATIVE
Protein, ur: 30 mg/dL — AB
Specific Gravity, Urine: 1.011 (ref 1.005–1.030)
WBC, UA: >50 WBC/hpf (ref 0–5)
pH: 5 (ref 5.0–8.0)

## 2024-06-03 LAB — CBG MONITORING, ED
Glucose-Capillary: 177 mg/dL — ABNORMAL HIGH (ref 70–99)
Glucose-Capillary: 155 mg/dL — ABNORMAL HIGH (ref 70–99)

## 2024-06-03 LAB — MAGNESIUM: Magnesium: 1.7 mg/dL (ref 1.7–2.4)

## 2024-06-03 MED ORDER — ACETAMINOPHEN 650 MG RE SUPP: 650.0000 mg | Freq: Four times a day (QID) | RECTAL | Status: DC | PRN

## 2024-06-03 MED ORDER — ENOXAPARIN SODIUM 40 MG/0.4ML IJ SOSY
40.0000 mg | PREFILLED_SYRINGE | INTRAMUSCULAR | Status: DC
  Administered 2024-06-03 – 2024-06-16 (×12): 40 mg via SUBCUTANEOUS
  Filled 2024-06-03 (×14): qty 0.4

## 2024-06-03 MED ORDER — CHLORHEXIDINE GLUCONATE CLOTH 2 % EX PADS
6.0000 | MEDICATED_PAD | Freq: Every day | CUTANEOUS | Status: DC
  Administered 2024-06-04 – 2024-06-07 (×3): 6 via TOPICAL

## 2024-06-03 MED ORDER — INSULIN ASPART 100 UNIT/ML IJ SOLN
0.0000 [IU] | Freq: Every day | INTRAMUSCULAR | Status: DC
  Administered 2024-06-03: 4 [IU] via SUBCUTANEOUS
  Administered 2024-06-04 – 2024-06-12 (×4): 2 [IU] via SUBCUTANEOUS
  Administered 2024-06-13: 4 [IU] via SUBCUTANEOUS
  Administered 2024-06-14: 2 [IU] via SUBCUTANEOUS
  Filled 2024-06-03 (×8): qty 1

## 2024-06-03 MED ORDER — INSULIN ASPART 100 UNIT/ML IJ SOLN
0.0000 [IU] | Freq: Three times a day (TID) | INTRAMUSCULAR | Status: DC
  Administered 2024-06-03 (×2): 2 [IU] via SUBCUTANEOUS
  Administered 2024-06-05: 1 [IU] via SUBCUTANEOUS
  Administered 2024-06-05: 3 [IU] via SUBCUTANEOUS
  Administered 2024-06-05 – 2024-06-06 (×2): 5 [IU] via SUBCUTANEOUS
  Administered 2024-06-06 (×2): 1 [IU] via SUBCUTANEOUS
  Administered 2024-06-07 (×2): 2 [IU] via SUBCUTANEOUS
  Administered 2024-06-08: 3 [IU] via SUBCUTANEOUS
  Administered 2024-06-08: 2 [IU] via SUBCUTANEOUS
  Administered 2024-06-09: 3 [IU] via SUBCUTANEOUS
  Administered 2024-06-09: 1 [IU] via SUBCUTANEOUS
  Administered 2024-06-10: 7 [IU] via SUBCUTANEOUS
  Administered 2024-06-11 (×2): 3 [IU] via SUBCUTANEOUS
  Administered 2024-06-11: 1 [IU] via SUBCUTANEOUS
  Administered 2024-06-12 (×2): 2 [IU] via SUBCUTANEOUS
  Administered 2024-06-13: 5 [IU] via SUBCUTANEOUS
  Administered 2024-06-14: 7 [IU] via SUBCUTANEOUS
  Administered 2024-06-14: 3 [IU] via SUBCUTANEOUS
  Administered 2024-06-14: 5 [IU] via SUBCUTANEOUS
  Administered 2024-06-15 (×2): 9 [IU] via SUBCUTANEOUS
  Administered 2024-06-15: 3 [IU] via SUBCUTANEOUS
  Administered 2024-06-16: 5 [IU] via SUBCUTANEOUS
  Filled 2024-06-03 (×31): qty 1

## 2024-06-03 MED ORDER — IOHEXOL 350 MG/ML SOLN
75.0000 mL | Freq: Once | INTRAVENOUS | Status: AC | PRN
  Administered 2024-06-03: 75 mL via INTRAVENOUS

## 2024-06-03 MED ORDER — ACETAMINOPHEN 325 MG PO TABS
650.0000 mg | ORAL_TABLET | Freq: Four times a day (QID) | ORAL | Status: DC | PRN
  Administered 2024-06-12: 650 mg via ORAL
  Filled 2024-06-03: qty 2

## 2024-06-03 MED ORDER — LEVOTHYROXINE SODIUM 100 MCG PO TABS
100.0000 ug | ORAL_TABLET | Freq: Every day | ORAL | Status: DC
  Administered 2024-06-03 – 2024-06-16 (×12): 100 ug via ORAL
  Filled 2024-06-03: qty 2
  Filled 2024-06-03 (×11): qty 1

## 2024-06-03 MED ORDER — INSULIN GLARGINE-YFGN 100 UNIT/ML ~~LOC~~ SOLN
5.0000 [IU] | Freq: Every day | SUBCUTANEOUS | Status: DC
  Administered 2024-06-03 – 2024-06-15 (×11): 5 [IU] via SUBCUTANEOUS
  Filled 2024-06-03 (×14): qty 0.05

## 2024-06-03 MED ORDER — SODIUM CHLORIDE 0.9 % IV SOLN
2.0000 g | Freq: Two times a day (BID) | INTRAVENOUS | Status: DC
  Administered 2024-06-03 – 2024-06-05 (×5): 2 g via INTRAVENOUS
  Filled 2024-06-03 (×5): qty 12.5

## 2024-06-03 MED ORDER — PRAZOSIN HCL 1 MG PO CAPS
1.0000 mg | ORAL_CAPSULE | Freq: Every day | ORAL | Status: DC
  Administered 2024-06-03 – 2024-06-15 (×11): 1 mg via ORAL
  Filled 2024-06-03 (×14): qty 1

## 2024-06-03 MED ORDER — ONDANSETRON HCL 4 MG PO TABS: 4.0000 mg | ORAL_TABLET | Freq: Four times a day (QID) | ORAL | Status: DC | PRN

## 2024-06-03 MED ORDER — ONDANSETRON HCL 4 MG/2ML IJ SOLN: 4.0000 mg | Freq: Four times a day (QID) | INTRAMUSCULAR | Status: DC | PRN

## 2024-06-03 MED ORDER — ATORVASTATIN CALCIUM 40 MG PO TABS
40.0000 mg | ORAL_TABLET | Freq: Every day | ORAL | Status: DC
  Administered 2024-06-03 – 2024-06-15 (×11): 40 mg via ORAL
  Filled 2024-06-03 (×12): qty 1

## 2024-06-03 MED ORDER — ALBUTEROL SULFATE HFA 108 (90 BASE) MCG/ACT IN AERS: 1.0000 | INHALATION_SPRAY | Freq: Four times a day (QID) | RESPIRATORY_TRACT | Status: DC | PRN

## 2024-06-03 MED ORDER — DULOXETINE HCL 30 MG PO CPEP
30.0000 mg | ORAL_CAPSULE | Freq: Every day | ORAL | Status: DC
  Administered 2024-06-03 – 2024-06-16 (×13): 30 mg via ORAL
  Filled 2024-06-03 (×15): qty 1

## 2024-06-03 MED ORDER — VANCOMYCIN HCL 750 MG/150ML IV SOLN
750.0000 mg | INTRAVENOUS | Status: DC
  Administered 2024-06-03: 750 mg via INTRAVENOUS
  Filled 2024-06-03 (×2): qty 150

## 2024-06-03 MED ORDER — ALBUTEROL SULFATE (2.5 MG/3ML) 0.083% IN NEBU: 2.5000 mg | INHALATION_SOLUTION | Freq: Four times a day (QID) | RESPIRATORY_TRACT | Status: DC | PRN

## 2024-06-03 NOTE — Progress Notes (Signed)
 Pharmacy Antibiotic Note  Carrie Clark is a 64 y.o. female admitted on 06/02/2024 with sepsis/UTI.  Pharmacy has been consulted for Vancomycin  dosing. WBC WNL. Scr 1.45.   Plan: Vancomycin  750 mg IV q24h >>>Estimated AUC: 461 Cefepime  per MD Trend WBC, temp, renal function  F/U infectious work-up Drug levels as indicated  Temp (24hrs), Avg:95.2 F (35.1 C), Min:92.1 F (33.4 C), Max:97.5 F (36.4 C)  Recent Labs  Lab 05/30/24 1222 06/02/24 2206 06/03/24 0538  WBC 7.2 5.6 5.3  CREATININE 1.26* 1.45*  --   LATICACIDVEN  --  1.4  --     Estimated Creatinine Clearance: 45.7 mL/min (A) (by C-G formula based on SCr of 1.45 mg/dL (H)).    Allergies[1]  Lynwood Mckusick, PharmD, BCPS Clinical Pharmacist Phone: [phone]      [1]  Allergies Allergen Reactions   Fish Allergy Other (See Comments)    Allergy reaction not listed- on [facility]

## 2024-06-03 NOTE — H&P (Addendum)
 " History and Physical    Patient: Carrie Clark FMW:[id number] DOB: 15-Mar-1961 DOA: 06/02/2024 DOS: the patient was seen and examined on 06/03/2024 PCP: Pcp, No  Patient coming from: Group home  Chief Complaint:  Chief Complaint  Patient presents with   Altered Mental Status   HPI: Carrie Clark is a 64 y.o. female with medical history significant of hypertension, hypothyroidism, COPD, CKD 3B, type 2 diabetes mellitus, tobacco dependence, history of CVA, tardive dyskinesia who presents to the emergency department from [facility] place via EMS due to altered mental status.  EMS was activated due to concern for altered mental status, patient was on few CNS acting drugs Patient was somnolent at bedside, she was arousable, but quickly goes back to sleep and was unable to provide any history.  History was obtained from EDP and ED medical record.  ED course In the emergency department, patient was hypothermic with temperature of 92.7F, other vital signs were within normal range.  Workup in the ED showed normocytic anemia and platelets of 136.  BMP showed bicarb of 41, blood glucose 148, BUN 37, creatinine 1.45, eGFR 41, urine drug screen was positive for benzodiazepine, urinalysis was positive for UTI CT head without contrast showed no acute intracranial pathology. CT abdominal pelvis was suggestive of gas-forming UTI CT angiography of chest ruled out pulmonary embolism She was initially started on IV Levophed  due to soft BP, but was weaned off this.  IV Solu-Cortef , Flagyl  and vancomycin  and IV cefepime  given.    Review of Systems: As mentioned in the history of present illness. All other systems reviewed and are negative. Past Medical History:  Diagnosis Date   COPD (chronic obstructive pulmonary disease) (HCC)    CVA (cerebral infarction)    Depression    Hypertension    Hypothyroidism    Schizoaffective disorder, bipolar type (HCC)    Schizophrenia (HCC)    Stroke (HCC)    Tardive dyskinesia     Tardive dyskinesia    Past Surgical History:  Procedure Laterality Date   NO PAST SURGERIES     Social History:  reports that she has never smoked. She has never used smokeless tobacco. She reports that she does not currently use alcohol. She reports that she does not currently use drugs.  Allergies[1]  Family History  Family history unknown: Yes    Prior to Admission medications  Medication Sig Start Date End Date Taking? Authorizing Provider  acetaminophen  (TYLENOL ) 325 MG tablet Take 650 mg by mouth every 6 (six) hours as needed for moderate pain (pain score 4-6).    [provider]  albuterol  (VENTOLIN  HFA) 108 (90 Base) MCG/ACT inhaler Inhale 1-2 puffs into the lungs every 6 (six) hours as needed for wheezing or shortness of breath. 01/15/24   Suellen, Celeste A, PA-C  atorvastatin  (LIPITOR) 40 MG tablet Take 40 mg by mouth at bedtime.     [provider]  cefdinir  (OMNICEF ) 300 MG capsule Take 1 capsule (300 mg total) by mouth 2 (two) times daily. 05/19/24   Evonnie Lenis, MD  diazepam  (VALIUM ) 10 MG tablet Take 10 mg by mouth 2 (two) times daily.    [provider]  diclofenac  Sodium (VOLTAREN ) 1 % GEL Apply 2 g topically 4 (four) times daily.    [provider]  donepezil  (ARICEPT ) 10 MG tablet Take 10 mg by mouth at bedtime.    [provider]  doxycycline  (VIBRA -TABS) 100 MG tablet Take 1 tablet (100 mg total) by mouth every 12 (  twelve) hours. 05/19/24   Evonnie Lenis, MD  DULoxetine  (CYMBALTA ) 30 MG capsule Take 1 capsule (30 mg total) by mouth daily. 11/17/19 05/27/27  Ernest Ronal BRAVO, MD  famotidine  (PEPCID ) 20 MG tablet Take 20 mg by mouth 2 (two) times daily.    [provider]  furosemide  (LASIX ) 20 MG tablet Take 1 tablet (20 mg total) by mouth daily. 11/11/14   Withrow, Norleen BROCKS, FNP  glycopyrrolate  (ROBINUL ) 1 MG tablet Take 1 mg by mouth 2 (two) times daily.    [provider]  insulin  aspart (NOVOLOG ) 100 UNIT/ML  injection Inject 2-10 Units into the skin 3 (three) times daily before meals. Sliding scale: check sugar before meals <200=0; 200-250=2 units;251-300=4 units;301-350=6 units;351-400= 8 units;>400=10 units and call MD    [provider]  insulin  glargine (LANTUS ) 100 UNIT/ML injection Inject 15 Units into the skin daily. Hold for BS<160    [provider]  levocetirizine (XYZAL ) 5 MG tablet Take 5 mg by mouth daily. 04/28/24   [provider]  levothyroxine  (SYNTHROID ) 100 MCG tablet Take 100 mcg by mouth daily before breakfast.    [provider]  OLANZapine  (ZYPREXA ) 2.5 MG tablet Take 2.5 mg by mouth 2 (two) times daily. 12/29/23   [provider]  Oxcarbazepine  (TRILEPTAL ) 300 MG tablet Take 300 mg by mouth 2 (two) times daily. 12/28/20   [provider]  potassium chloride  SA (KLOR-CON  M) 20 MEQ tablet Take 20 mEq by mouth daily.    [provider]  prazosin  (MINIPRESS ) 1 MG capsule Take 1 mg by mouth at bedtime. 12/28/20   [provider]  SPIRIVA  HANDIHALER 18 MCG CAPS Irrigate with 1 capsule as directed daily. 05/06/24   [provider]  traZODone  (DESYREL ) 100 MG tablet Take 100 mg by mouth at bedtime. 12/29/23   [provider]  trihexyphenidyl  (ARTANE ) 2 MG tablet Take 2 mg by mouth at bedtime. 12/27/20   [provider]  VRAYLAR  4.5 MG CAPS Take 1 capsule by mouth daily.    [provider]  ziprasidone  (GEODON ) 80 MG capsule Take 80 mg by mouth at bedtime.    [provider]  zolpidem  (AMBIEN ) 5 MG tablet Take 5 mg by mouth at bedtime.    [provider]  fluPHENAZine  (PROLIXIN ) 10 MG tablet Take 1 tablet (10 mg total) by mouth 3 (three) times daily. 12/03/18 11/17/19  Mody, Sital, MD  fluPHENAZine  decanoate (PROLIXIN ) 25 MG/ML injection Inject 50 mg into the muscle every 28 (twenty-eight) days.  11/17/19  [provider]  lurasidone  (LATUDA ) 20 MG TABS tablet Take 20 mg  by mouth daily in the afternoon.   11/17/19  [provider]    Physical Exam: Vitals:   06/03/24 0236 06/03/24 0300 06/03/24 0330 06/03/24 0400  BP: (!) 164/68 (!) 156/63 (!) 144/61 (!) 161/66  Pulse: 76 70 73 83  Resp: 14 13 13 13   Temp: (!) 96.3 F (35.7 C) (!) 96.4 F (35.8 C) (!) 96.6 F (35.9 C) (!) 96.8 F (36 C)  TempSrc:      SpO2: 95% 93% 92% 94%   General: Somnolent, though easily arousable, but quickly goes back to sleep. Not in any acute distress.  HEENT: NCAT.  PERRL. Sclerae anicteric.  Moist mucosal membranes. Neck: Neck supple without lymphadenopathy. No carotid bruits. No masses palpated.  Cardiovascular: Regular rate with normal S1-S2 sounds. No murmurs, rubs or gallops auscultated. No JVD.  Respiratory: Clear breath sounds.  No accessory muscle  use. Abdomen: Soft, nontender, nondistended. Active bowel sounds. No masses or hepatosplenomegaly  Skin: No rashes, lesions, or ulcerations.  Dry, warm to touch. Musculoskeletal:  2+ dorsalis pedis and radial pulses. Good ROM.  No contractures  Psychiatric:  this cannot be assessed at this time due to patient's chronic condition. Neurologic: No focal neurological deficits.   Assessment and Plan: Acute metabolic encephalopathy possibly secondary to UTI Patient was empirically treated with IV vancomycin , cefepime  and Flagyl  Prior urine culture in August 2025 was positive for Staphylococcus lugdunensis Continue IV cefepime  and vancomycin   Hypothermia Patient was placed on Bair hugger Continue to monitor patient's temperature  Hypotension - resolved IV Solu-Cortef  was given Patient was initially placed on IV Levophed , but has since been weaned off this  Obesity class II (BMI 37.09) Diet and lifestyle modification  COPD  Continue Ventolin  as needed  Mixed hyperlipidemia Continue Lipitor   Schizoaffective disorder, bipolar type Continue home meds after med rec is updated  Type 2 diabetes mellitus,  uncontrolled with hyperglycemia A1c on 05/16/24 was 9.7 Continue ISS and hypoglycemia protocol    Essential hypertension (controlled) Continue prazosin    Acquired hypothyroidism Continue Synthroid    CKD stage 3b Stable,baseline creatinine 1.0-1.3   Advance Care Planning:   Code Status: Full Code   Consults: None  Family Communication: None at bedside  Severity of Illness: The appropriate patient status for this patient is INPATIENT. Inpatient status is judged to be reasonable and necessary in order to provide the required intensity of service to ensure the patient's safety. The patient's presenting symptoms, physical exam findings, and initial radiographic and laboratory data in the context of their chronic comorbidities is felt to place them at high risk for further clinical deterioration. Furthermore, it is not anticipated that the patient will be medically stable for discharge from the hospital within 2 midnights of admission.   * I certify that at the point of admission it is my clinical judgment that the patient will require inpatient hospital care spanning beyond 2 midnights from the point of admission due to high intensity of service, high risk for further deterioration and high frequency of surveillance required.*  Author: Hooper Petteway, DO 06/03/2024 5:23 AM  For on call review [email or web address].      [1]  Allergies Allergen Reactions   Fish Allergy Other (See Comments)    Allergy reaction not listed- on MAR   "

## 2024-06-03 NOTE — ED Notes (Signed)
 Pt was able to tell this RN that she was at a hospital and her name but not able to tell us  her birth date, year, or event.

## 2024-06-03 NOTE — ED Notes (Signed)
 Temp 96.5 and placed on bear hugger at this time.

## 2024-06-03 NOTE — ED Notes (Signed)
 [facility] updated-spoke with Reena.

## 2024-06-03 NOTE — Progress Notes (Addendum)
" °  Progress Note   Patient: Carrie Clark FMW:[id number] DOB: 1960-08-24 DOA: 06/02/2024     0 DOS: the patient was seen and examined on 06/03/2024  Same Day admission Note:   64 y.o. female with medical history significant of hypertension, hypothyroidism, COPD, CKD 3B, type 2 diabetes mellitus, tobacco dependence, history of CVA, tardive dyskinesia who presents to the emergency department from [facility] via EMS due to altered mental status.  ED course In the emergency department, patient was hypothermic with temperature of 92.21F, other vital signs were within normal range.  Workup in the ED showed normocytic anemia and platelets of 136.  BMP showed bicarb of 41, blood glucose 148, BUN 37, creatinine 1.45, eGFR 41, urine drug screen was positive for benzodiazepine, urinalysis was positive for UTI CT head without contrast showed no acute intracranial pathology. CT abdominal pelvis was suggestive of gas-forming UTI CT angiography of chest ruled out pulmonary embolism She was initially started on IV Levophed  due to soft BP, but was weaned off this.  IV Solu-Cortef , Flagyl  and vancomycin  and IV cefepime  given.  Admitted for acute metabolic encephalopathy in the setting of possible UTI. She is also hypothermic and is on warming blankets. Current temp is 97.2. Hypotension has resolved. She is a resident at [facility] and dependent on home O2.   Author: Deliliah Room, MD 06/03/2024 11:19 AM  For on call review [email or web address].  "

## 2024-06-03 NOTE — ED Notes (Signed)
 Pt was unable to hold the probe for the thermometer and the axilary did not make since.Treatment team wanted to wait until after the Narcan  to see if the pt would follow commands. KCM ~

## 2024-06-03 NOTE — ED Notes (Addendum)
 Pts temp foley read 97.8 at this time and the Bair hugger was taken off and the pt endorsed that she was hot.

## 2024-06-03 NOTE — TOC Initial Note (Signed)
 Transition of Care [facility]) - Initial/Assessment Note    Patient Details  Name: Carrie Clark MRN: [id number] Date of Birth: 08-01-1960  Transition of Care [facility]) CM/SW Contact:    Lucie Lunger, LCSWA Phone Number: 06/03/2024, 9:59 AM  Clinical Narrative:                 CSW notes per chart review that pt is high risk for readmission. CSW notes that pt is a resident at [facility]. CSW spoke to owner Randie to complete assessment. Pt states that pt is mostly independent with completing ADLs. Pt has home O2 supplied through [employer] and a rolling walker to use when needed. Pt has transportation provided by the facility.   CSW spoke with Randie about weekend D/C being possible. Pt states that pt if there are any medication changes pt will not be able to D/C over weekend as facility pharmacy is closed over the weekend. TOC to follow.   Expected Discharge Plan: Group Home Barriers to Discharge: Continued Medical Work up   Patient Goals and CMS Choice Patient states their goals for this hospitalization and ongoing recovery are:: return to group home CMS Medicare.gov Compare Post Acute Care list provided to:: Patient Represenative (must comment) Choice offered to / list presented to : [facility] POA / Guardian      Expected Discharge Plan and Services In-house Referral: Clinical Social Work Discharge Planning Services: CM Consult   Living arrangements for the past 2 months: Group Home                                      Prior Living Arrangements/Services Living arrangements for the past 2 months: Group Home Lives with:: Facility Resident Patient language and need for interpreter reviewed:: Yes Do you feel safe going back to the place where you live?: Yes      Need for Family Participation in Patient Care: Yes (Comment) Care giver support system in place?: Yes (comment) Current home services: DME Criminal Activity/Legal Involvement Pertinent to Current  Situation/Hospitalization: No - Comment as needed  Activities of Daily Living      Permission Sought/Granted                  Emotional Assessment Appearance:: Appears stated age       Alcohol / Substance Use: Not Applicable Psych Involvement: No (comment)  Admission diagnosis:  UTI (urinary tract infection) [N39.0] Patient Active Problem List   Diagnosis Date Noted   Lobar pneumonia 05/19/2024   Acute respiratory failure with hypoxia (HCC) 05/19/2024   CAP (community acquired pneumonia) 05/17/2024   Community acquired pneumonia 05/17/2024   Pain due to onychomycosis of toenails of both feet 10/18/2020   UTI (urinary tract infection) 12/03/2018   Hypothyroidism 12/03/2018   HTN (hypertension) 12/03/2018   COPD (chronic obstructive pulmonary disease) (HCC) 12/03/2018   Diabetes (HCC) 12/03/2018   Altered mental status 12/03/2018   Schizoaffective disorder, bipolar type (HCC) 09/11/2015   Aggression    Episode of behavior change    Psychoses (HCC)    PCP:  Lenora Lovena Mason, FNP Pharmacy:   [facility], [place] - [address] [place] [address] Phone: [phone] Fax: [phone]     Social Drivers of Health (SDOH) Social History: SDOH Screenings   Food Insecurity: No Food Insecurity (05/17/2024)  Housing: Patient Declined (05/18/2024)  Transportation Needs: No Transportation Needs (05/17/2024)  Utilities: Not At Risk (05/17/2024)  Tobacco Use: Low Risk (06/03/2024)   SDOH Interventions:     Readmission Risk Interventions    06/03/2024    9:57 AM 05/18/2024   10:05 AM  Readmission Risk Prevention Plan  Transportation Screening Complete Complete  Home Care Screening  Complete  Medication Review (RN CM)  Complete  HRI or Home Care Consult Complete   Social Work Consult for Recovery Care Planning/Counseling Complete   Palliative Care Screening Not Applicable   Medication Review Oceanographer) Complete

## 2024-06-03 NOTE — ED Provider Notes (Signed)
 Care assumed at shift change. Here from SNF with ?SOB but not present on EMS arrival. Noted to be hypothermic and low BP here, started on Levo and warming, treated as presumed sepsis. Labs largely unremarkable. Attempting to wean Levo before admission.  Physical Exam  BP (!) 120/55   Pulse 62   Temp (!) 93.9 F (34.4 C)   Resp 14   SpO2 94%   Physical Exam  Procedures  Procedures  ED Course / MDM   Clinical Course as of 06/03/24 0105  Thu Jun 03, 2024  0059 Levo weaned and MAPs remain sufficient. UA is consistent with UTI, already given board spectrum Abx. Will page Hospitalist for admission.  [CS]  0105 Spoke with Dr. Manfred, Hospitalist, who will evaluate for admission.  [CS]    Clinical Course User Index [CS] Roselyn Carlin NOVAK, MD   Medical Decision Making Risk Prescription drug management. Decision regarding hospitalization.          Roselyn Carlin NOVAK, MD 06/03/24 [phone]

## 2024-06-03 NOTE — Plan of Care (Signed)
 Patient appears baseline oriented.  Remains hypothermic with warming blanket and was changed to bearhugger.

## 2024-06-03 NOTE — ED Notes (Signed)
 Two extra warm blankets applied.

## 2024-06-03 NOTE — ED Notes (Signed)
 Warm blankets given and one wrapped around neck and head for warmth due to temp gradually coming down. Nad. Pt is alert and near baseline

## 2024-06-04 DIAGNOSIS — N3000 Acute cystitis without hematuria: Secondary | ICD-10-CM | POA: Diagnosis not present

## 2024-06-04 LAB — CBC
HCT: 33.8 % — ABNORMAL LOW (ref 36.0–46.0)
Hemoglobin: 10.2 g/dL — ABNORMAL LOW (ref 12.0–15.0)
MCH: 29.9 pg (ref 26.0–34.0)
MCHC: 30.2 g/dL (ref 30.0–36.0)
MCV: 99.1 fL (ref 80.0–100.0)
Platelets: 157 K/uL (ref 150–400)
RBC: 3.41 MIL/uL — ABNORMAL LOW (ref 3.87–5.11)
RDW: 16.3 % — ABNORMAL HIGH (ref 11.5–15.5)
WBC: 6.5 K/uL (ref 4.0–10.5)
nRBC: 0 % (ref 0.0–0.2)

## 2024-06-04 LAB — BASIC METABOLIC PANEL WITH GFR
Anion gap: 10 (ref 5–15)
BUN: 30 mg/dL — ABNORMAL HIGH (ref 8–23)
CO2: 31 mmol/L (ref 22–32)
Calcium: 9.3 mg/dL (ref 8.9–10.3)
Chloride: 102 mmol/L (ref 98–111)
Creatinine, Ser: 1.39 mg/dL — ABNORMAL HIGH (ref 0.44–1.00)
GFR, Estimated: 43 mL/min — ABNORMAL LOW
Glucose, Bld: 152 mg/dL — ABNORMAL HIGH (ref 70–99)
Potassium: 4.4 mmol/L (ref 3.5–5.1)
Sodium: 143 mmol/L (ref 135–145)

## 2024-06-04 LAB — GLUCOSE, CAPILLARY
Glucose-Capillary: 137 mg/dL — ABNORMAL HIGH (ref 70–99)
Glucose-Capillary: 259 mg/dL — ABNORMAL HIGH (ref 70–99)

## 2024-06-04 MED ORDER — DONEPEZIL HCL 5 MG PO TABS
10.0000 mg | ORAL_TABLET | Freq: Every day | ORAL | Status: DC
  Administered 2024-06-04 – 2024-06-15 (×10): 10 mg via ORAL
  Filled 2024-06-04 (×12): qty 2

## 2024-06-04 MED ORDER — ZIPRASIDONE MESYLATE 20 MG IM SOLR
20.0000 mg | INTRAMUSCULAR | Status: DC | PRN
  Administered 2024-06-05: 20 mg via INTRAMUSCULAR
  Filled 2024-06-04 (×2): qty 20

## 2024-06-04 MED ORDER — ZIPRASIDONE HCL 80 MG PO CAPS
80.0000 mg | ORAL_CAPSULE | Freq: Every day | ORAL | Status: DC
  Filled 2024-06-04: qty 1

## 2024-06-04 MED ORDER — TRAZODONE HCL 50 MG PO TABS
100.0000 mg | ORAL_TABLET | Freq: Every day | ORAL | Status: DC
  Administered 2024-06-04 – 2024-06-15 (×10): 100 mg via ORAL
  Filled 2024-06-04 (×12): qty 2

## 2024-06-04 MED ORDER — DIAZEPAM 5 MG PO TABS
10.0000 mg | ORAL_TABLET | Freq: Two times a day (BID) | ORAL | Status: DC | PRN
  Administered 2024-06-04 – 2024-06-05 (×2): 10 mg via ORAL
  Filled 2024-06-04 (×3): qty 2

## 2024-06-04 MED ORDER — OXCARBAZEPINE 300 MG PO TABS
300.0000 mg | ORAL_TABLET | Freq: Two times a day (BID) | ORAL | Status: DC
  Administered 2024-06-04 – 2024-06-16 (×21): 300 mg via ORAL
  Filled 2024-06-04 (×26): qty 1

## 2024-06-04 MED ORDER — FUROSEMIDE 20 MG PO TABS
20.0000 mg | ORAL_TABLET | Freq: Every day | ORAL | Status: DC
  Administered 2024-06-05 – 2024-06-16 (×10): 20 mg via ORAL
  Filled 2024-06-04 (×13): qty 1

## 2024-06-04 MED ORDER — TRIHEXYPHENIDYL HCL 2 MG PO TABS
2.0000 mg | ORAL_TABLET | Freq: Every day | ORAL | Status: DC
  Administered 2024-06-04 – 2024-06-15 (×10): 2 mg via ORAL
  Filled 2024-06-04 (×13): qty 1

## 2024-06-04 MED ORDER — OLANZAPINE 5 MG PO TABS
5.0000 mg | ORAL_TABLET | Freq: Two times a day (BID) | ORAL | Status: DC
  Administered 2024-06-04 – 2024-06-07 (×6): 5 mg via ORAL
  Filled 2024-06-04 (×8): qty 1

## 2024-06-04 MED ORDER — HYDRALAZINE HCL 20 MG/ML IJ SOLN: 10.0000 mg | INTRAMUSCULAR | Status: DC | PRN

## 2024-06-04 MED ORDER — ZIPRASIDONE HCL 40 MG PO CAPS
40.0000 mg | ORAL_CAPSULE | Freq: Two times a day (BID) | ORAL | Status: DC
  Administered 2024-06-05 – 2024-06-16 (×17): 40 mg via ORAL
  Filled 2024-06-04 (×27): qty 1

## 2024-06-04 NOTE — TOC Progression Note (Signed)
 Transition of Care [facility]) - Progression Note    Patient Details  Name: Carrie Clark MRN: [id number] Date of Birth: 01/07/1961  Transition of Care [facility]) CM/SW Contact  Noreen KATHEE [place], [place] Phone Number: 06/04/2024, 11:57 AM  Clinical Narrative:     CSW called Randie to see if they can accept pt this weekend. Randie stated that it will have to be during the week due to their pharmacy being closed on the weekend. Randie did share that she would need the DC summary along with patient oxygen order to be sent to there pharmacy [facility] pharmacy- Address: [address], [place], [place] [address] Fax: [phone] because they did not receive one last time- but patient does have the oxygen at [facility].  Expected Discharge Plan: Group Home Barriers to Discharge: Continued Medical Work up       Expected Discharge Plan and Services In-house Referral: Clinical Social Work Discharge Planning Services: CM Consult   Living arrangements for the past 2 months: Group Home                                       Social Drivers of Health (SDOH) Interventions SDOH Screenings   Food Insecurity: No Food Insecurity (05/17/2024)  Housing: Patient Declined (05/18/2024)  Transportation Needs: No Transportation Needs (05/17/2024)  Utilities: Not At Risk (05/17/2024)  Tobacco Use: Low Risk (06/03/2024)    Readmission Risk Interventions    06/04/2024   11:54 AM 06/03/2024    9:57 AM 05/18/2024   10:05 AM  Readmission Risk Prevention Plan  Transportation Screening Complete Complete Complete  Home Care Screening   Complete  Medication Review (RN CM)   Complete  HRI or Home Care Consult Complete Complete   Social Work Consult for Recovery Care Planning/Counseling Complete Complete   Palliative Care Screening Not Applicable Not Applicable   Medication Review Oceanographer) Complete Complete

## 2024-06-04 NOTE — Progress Notes (Signed)
 Patient with significant psychiatric history. She is refusing all her meds that MD restarted.  She is very irritable when staff enters room and has removed all tele leads and pulse oximeter.  She is more calm and talking to herself when no one is in room.  MD aware and will continue to give her space, for hopefull mood change.

## 2024-06-04 NOTE — Progress Notes (Signed)
 Pt has been refusing meds convinced her to take what I could. Hopefully she will take more later. Pt took Valium , donepezil , zyprexa ,trileptal  and trazodone .

## 2024-06-04 NOTE — Plan of Care (Signed)
 Medically patient has remained stable but mental status has worsened.  She is refusing all PO meds, meals, sugar checks, lead placement.  Verbally  aggressive at times but have kept distance.  Sister was visiting for about 2 hrs but could not get her to take her medications either.

## 2024-06-04 NOTE — Plan of Care (Signed)
" °  Problem: Education: Goal: Ability to describe self-care measures that may prevent or decrease complications (Diabetes Survival Skills Education) will improve Outcome: Not Applicable Goal: Individualized Educational Video(s) Outcome: Not Applicable   Problem: Health Behavior/Discharge Planning: Goal: Ability to identify and utilize available resources and services will improve Outcome: Not Applicable Goal: Ability to manage health-related needs will improve Outcome: Not Applicable   Problem: Nutritional: Goal: Maintenance of adequate nutrition will improve Outcome: Progressing Goal: Progress toward achieving an optimal weight will improve Outcome: Not Applicable   Problem: Metabolic: Goal: Ability to maintain appropriate glucose levels will improve Outcome: Progressing   Problem: Education: Goal: Knowledge of General Education information will improve Description: Including pain rating scale, medication(s)/side effects and non-pharmacologic comfort measures Outcome: Not Applicable   Problem: Health Behavior/Discharge Planning: Goal: Ability to manage health-related needs will improve Outcome: Not Applicable   Problem: Clinical Measurements: Goal: Ability to maintain clinical measurements within normal limits will improve Outcome: Progressing Goal: Will remain free from infection Outcome: Progressing Goal: Diagnostic test results will improve Outcome: Progressing Goal: Respiratory complications will improve Outcome: Progressing Goal: Cardiovascular complication will be avoided Outcome: Not Applicable   Problem: Activity: Goal: Risk for activity intolerance will decrease Outcome: Progressing   Problem: Nutrition: Goal: Adequate nutrition will be maintained Outcome: Not Applicable   Problem: Coping: Goal: Level of anxiety will decrease Outcome: Progressing   Problem: Elimination: Goal: Will not experience complications related to bowel motility Outcome:  Progressing Goal: Will not experience complications related to urinary retention Outcome: Progressing   Problem: Pain Managment: Goal: General experience of comfort will improve and/or be controlled Outcome: Not Applicable   Problem: Safety: Goal: Ability to remain free from injury will improve Outcome: Progressing   Problem: Skin Integrity: Goal: Risk for impaired skin integrity will decrease Outcome: Progressing   "

## 2024-06-04 NOTE — Progress Notes (Signed)
 " PROGRESS NOTE  Carrie Clark  FMW:[id number] DOB: July 18, 1960 DOA: 06/02/2024 PCP: Lenora Lovena Mason, FNP  Consultants  Brief Narrative: 64 y.o. female with medical history significant of hypertension, hypothyroidism, COPD, CKD 3B, type 2 diabetes mellitus, tobacco dependence, history of CVA, tardive dyskinesia who presents to the emergency department from [facility] via EMS due to altered mental status.   Admitted for acute metabolic encephalopathy in the setting of possible UTI. She is also hypothermic and is on warming blankets. Current temp is 97.2. Hypotension has resolved. She is a resident at [facility] and dependent on home O2.   Assessment & Plan: Acute metabolic encephalopathy possibly secondary to UTI Patient was empirically treated with IV vancomycin , cefepime  and Flagyl  Prior urine culture in August 2025 was positive for Staphylococcus lugdunensis-->this admit, urine culture not obtained unfortunately. Continue IV cefepime , can DC vanc.   Overall, she is more awake and alert but has become somewhat combative, see below   Hypothermia Patient was placed on Bair hugger Continue to monitor patient's temperature--> resolved.  Likely secondary to UTI above.  Schizoaffective disorder, bipolar type Med rec not completed until today.  Home meds not restarted until later this morning, however patient become paranoid by this time.  Refusing home meds. - She has also become somewhat paranoid that people here in the hospital are trying to hurt her. -Written for IM Geodon  if needed for acute agitation.  She is also on chronic twice daily diazepam , we can switch this to IM Ativan  if she becomes further agitated.    Hypotension - resolved IV Solu-Cortef  was given Patient was initially placed on IV Levophed , but has since been weaned off this Now elevated blood pressures are the issue.  Hypertension: - No formal documented history of the same. - Hydralazine   as needed written.  Systolics been rising as her agitation has increased in house. -Could also start amlodipine if needed, review of prior outpatient records showed blood pressures in the 140s to 160s systolic repeatedly.   Obesity class II (BMI 37.09) Diet and lifestyle modification   COPD  Continue Ventolin  as needed   Mixed hyperlipidemia Continue Lipitor   Type 2 diabetes mellitus, uncontrolled with hyperglycemia A1c on 05/16/24 was 9.7 Continue ISS and hypoglycemia protocol    Acquired hypothyroidism Continue Synthroid    CKD stage 3b Stable,baseline creatinine 1.0-1.3   DVT prophylaxis:  enoxaparin  (LOVENOX ) injection 40 mg Start: 06/03/24 1000 SCDs Start: 06/03/24 0429  Code Status:   Code Status: Full Code Level of care: Med-Surg Status is: Inpatient Dispo:  Transfer to floor bed today.    Subjective: Patient reports feeling better than prior to admission.  No complaints today other than feeling hungry.  Actively hallucinating.  **Seen and examined early this morning.  Patient did become more paranoid as they progressed.  Objective: Vitals:   06/04/24 0500 06/04/24 0600 06/04/24 0700 06/04/24 0800  BP: (!) 160/50 (!) 170/47 (!) 173/62 (!) 186/60  Pulse: 61 74 65 78  Resp: 13 16 (!) 34 18  Temp: (!) 97.5 F (36.4 C) (!) 97.3 F (36.3 C) (!) 97.3 F (36.3 C) (!) 97.5 F (36.4 C)  TempSrc:      SpO2: 92% (!) 87% 90% (!) 84%    Intake/Output Summary (Last 24 hours) at 06/04/2024 1003 Last data filed at 06/04/2024 0800 Gross per 24 hour  Intake 570.23 ml  Output 400 ml  Net 170.23 ml   There were no vitals filed for this visit. There is  no height or weight on file to calculate BMI.  Gen: 64 y.o. female in no apparent distress.  Nontoxic, lying in bed, talking and pointing to a corner of the room when I walked into the room.  Pulm: Non-labored breathing.  Clear to auscultation bilaterally.  CV: Regular rate and rhythm. No murmur, rub, or gallop. No JVD GI:  Abdomen soft, non-tender, non-distended, with normoactive bowel sounds. No organomegaly or masses felt. Ext: Warm, no deformities Skin: No rashes, lesions  Neuro: Alert and oriented to person, place. No focal neurological deficits. Psych: Calm, not anxious appearing.  Actively hallucinating that other people are in the room with her, talking to other people in room, but very easy redirected.     I have personally reviewed the following labs and images: CBC: Recent Labs  Lab 05/30/24 1222 06/02/24 2206 06/02/24 2208 06/03/24 0538 06/04/24 0815  WBC 7.2 5.6  --  5.3 6.5  NEUTROABS 4.2 3.5  --   --   --   HGB 11.0* 10.8*  --  10.8* 10.2*  HCT 35.9* 35.4*  --  34.5* 33.8*  MCV 98.6 98.1  --  97.7 99.1  PLT 192 136* 146* 189 157   BMP &GFR Recent Labs  Lab 05/30/24 1222 06/02/24 2206 06/03/24 0620 06/04/24 0815  NA 141 143 140 143  K 4.0 4.5 5.0 4.4  CL 100 99 100 102  CO2 31 41* 40* 31  GLUCOSE 145* 148* 186* 152*  BUN 26* 37* 34* 30*  CREATININE 1.26* 1.45* 1.25* 1.39*  CALCIUM  9.5 9.5 9.1 9.3  MG 2.1 2.0 1.7  --   PHOS  --   --  3.5  --    Estimated Creatinine Clearance: 47.7 mL/min (A) (by C-G formula based on SCr of 1.39 mg/dL (H)). Liver & Pancreas: Recent Labs  Lab 05/30/24 1222 06/02/24 2206 06/03/24 0620  AST 24 24 25   ALT 17 16 18   ALKPHOS 144* 140* 141*  BILITOT 0.2 0.2 0.3  PROT 6.6 6.1* 5.8*  ALBUMIN 3.7 3.6 3.5   No results for input(s): LIPASE, AMYLASE in the last 168 hours. Recent Labs  Lab 06/02/24 2206  AMMONIA 29   Diabetic: No results for input(s): HGBA1C in the last 72 hours. Recent Labs  Lab 06/03/24 0758 06/03/24 1137 06/03/24 1618 06/03/24 2148 06/04/24 0720  GLUCAP 177* 155* 108* 347* 137*   Cardiac Enzymes: No results for input(s): CKTOTAL, CKMB, CKMBINDEX, TROPONINI in the last 168 hours. Recent Labs    05/17/24 1101 06/02/24 2206  PROBNP 481.0* 138.0   Coagulation Profile: Recent Labs  Lab  06/02/24 2208  INR 0.9   Thyroid  Function Tests: No results for input(s): TSH, T4TOTAL, FREET4, T3FREE, THYROIDAB in the last 72 hours. Lipid Profile: No results for input(s): CHOL, HDL, LDLCALC, TRIG, CHOLHDL, LDLDIRECT in the last 72 hours. Anemia Panel: No results for input(s): VITAMINB12, FOLATE, FERRITIN, TIBC, IRON, RETICCTPCT in the last 72 hours. Urine analysis:    Component Value Date/Time   COLORURINE YELLOW 06/02/2024 2341   APPEARANCEUR HAZY (A) 06/02/2024 2341   LABSPEC 1.011 06/02/2024 2341   PHURINE 5.0 06/02/2024 2341   GLUCOSEU NEGATIVE 06/02/2024 2341   HGBUR MODERATE (A) 06/02/2024 2341   BILIRUBINUR NEGATIVE 06/02/2024 2341   KETONESUR NEGATIVE 06/02/2024 2341   PROTEINUR 30 (A) 06/02/2024 2341   UROBILINOGEN 0.2 12/09/2014 2020   NITRITE NEGATIVE 06/02/2024 2341   LEUKOCYTESUR MODERATE (A) 06/02/2024 2341   Sepsis Labs: Invalid input(s): PROCALCITONIN, LACTICIDVEN  Microbiology: Recent Results (  from the past 240 hours)  Blood culture (routine x 2)     Status: None (Preliminary result)   Collection Time: 06/02/24 10:06 PM   Specimen: BLOOD  Result Value Ref Range Status   Specimen Description BLOOD RIGHT ANTECUBITAL  Final   Special Requests   Final    AEROBIC BOTTLE ONLY Blood Culture results may not be optimal due to an inadequate volume of blood received in culture bottles   Culture   Final    NO GROWTH 2 DAYS Performed at [facility], [address], [place], [place] [address]    Report Status PENDING  Incomplete  Blood culture (routine x 2)     Status: None (Preliminary result)   Collection Time: 06/02/24 10:06 PM   Specimen: BLOOD  Result Value Ref Range Status   Specimen Description BLOOD BLOOD RIGHT FOREARM  Final   Special Requests   Final    AEROBIC BOTTLE ONLY Blood Culture results may not be optimal due to an inadequate volume of blood received in culture bottles   Culture   Final    NO GROWTH 2  DAYS Performed at [facility], [address] [place], [place] [address]    Report Status PENDING  Incomplete    Radiology Studies: No results found.  Scheduled Meds:  atorvastatin   40 mg Oral QHS   Chlorhexidine  Gluconate Cloth  6 each Topical Q0600   DULoxetine   30 mg Oral Daily   enoxaparin  (LOVENOX ) injection  40 mg Subcutaneous Q24H   insulin  aspart  0-5 Units Subcutaneous QHS   insulin  aspart  0-9 Units Subcutaneous TID WC   insulin  glargine-yfgn  5 Units Subcutaneous QHS   levothyroxine   100 mcg Oral QAC breakfast   prazosin   1 mg Oral QHS   Continuous Infusions:  ceFEPime  (MAXIPIME ) IV 2 g (06/03/24 2202)   norepinephrine  (LEVOPHED ) Adult infusion Stopped (06/03/24 0001)   vancomycin  750 mg (06/03/24 2202)     LOS: 1 day   35 minutes with more than 50% spent in reviewing records, counseling patient/family and coordinating care.  Reyes VEAR Gaw, MD [facility] [email or web address] 06/04/2024, 10:03 AM    "

## 2024-06-05 DIAGNOSIS — N3000 Acute cystitis without hematuria: Secondary | ICD-10-CM | POA: Diagnosis not present

## 2024-06-05 LAB — CBC
HCT: 34.7 % — ABNORMAL LOW (ref 36.0–46.0)
Hemoglobin: 10.7 g/dL — ABNORMAL LOW (ref 12.0–15.0)
MCH: 30.1 pg (ref 26.0–34.0)
MCHC: 30.8 g/dL (ref 30.0–36.0)
MCV: 97.7 fL (ref 80.0–100.0)
Platelets: 151 K/uL (ref 150–400)
RBC: 3.55 MIL/uL — ABNORMAL LOW (ref 3.87–5.11)
RDW: 16 % — ABNORMAL HIGH (ref 11.5–15.5)
WBC: 5.8 K/uL (ref 4.0–10.5)
nRBC: 0 % (ref 0.0–0.2)

## 2024-06-05 LAB — BASIC METABOLIC PANEL WITH GFR
Anion gap: 2 — ABNORMAL LOW (ref 5–15)
BUN: 21 mg/dL (ref 8–23)
CO2: 39 mmol/L — ABNORMAL HIGH (ref 22–32)
Calcium: 9.3 mg/dL (ref 8.9–10.3)
Chloride: 102 mmol/L (ref 98–111)
Creatinine, Ser: 1.2 mg/dL — ABNORMAL HIGH (ref 0.44–1.00)
GFR, Estimated: 51 mL/min — ABNORMAL LOW
Glucose, Bld: 113 mg/dL — ABNORMAL HIGH (ref 70–99)
Potassium: 3.9 mmol/L (ref 3.5–5.1)
Sodium: 143 mmol/L (ref 135–145)

## 2024-06-05 LAB — GLUCOSE, CAPILLARY
Glucose-Capillary: 127 mg/dL — ABNORMAL HIGH (ref 70–99)
Glucose-Capillary: 213 mg/dL — ABNORMAL HIGH (ref 70–99)
Glucose-Capillary: 274 mg/dL — ABNORMAL HIGH (ref 70–99)
Glucose-Capillary: 149 mg/dL — ABNORMAL HIGH (ref 70–99)

## 2024-06-05 MED ORDER — DIAZEPAM 5 MG PO TABS
5.0000 mg | ORAL_TABLET | Freq: Two times a day (BID) | ORAL | Status: DC | PRN
  Administered 2024-06-06 – 2024-06-16 (×6): 5 mg via ORAL
  Filled 2024-06-05 (×9): qty 1

## 2024-06-05 MED ORDER — SODIUM CHLORIDE 0.9 % IV SOLN
1.0000 g | INTRAVENOUS | Status: AC
  Administered 2024-06-05 – 2024-06-09 (×5): 1 g via INTRAVENOUS
  Filled 2024-06-05 (×5): qty 10

## 2024-06-05 NOTE — Plan of Care (Signed)
" °  Problem: Fluid Volume: Goal: Ability to maintain a balanced intake and output will improve Outcome: Progressing   Problem: Skin Integrity: Goal: Risk for impaired skin integrity will decrease Outcome: Progressing   Problem: Clinical Measurements: Goal: Ability to maintain clinical measurements within normal limits will improve Outcome: Progressing   Problem: Activity: Goal: Risk for activity intolerance will decrease Outcome: Progressing   Problem: Elimination: Goal: Will not experience complications related to urinary retention Outcome: Progressing   Problem: Skin Integrity: Goal: Risk for impaired skin integrity will decrease Outcome: Progressing   "

## 2024-06-05 NOTE — Plan of Care (Signed)
" °  Problem: Coping: Goal: Ability to adjust to condition or change in health will improve Outcome: Progressing   Problem: Fluid Volume: Goal: Ability to maintain a balanced intake and output will improve Outcome: Progressing   Problem: Metabolic: Goal: Ability to maintain appropriate glucose levels will improve Outcome: Progressing   Problem: Nutritional: Goal: Maintenance of adequate nutrition will improve Outcome: Progressing   Problem: Skin Integrity: Goal: Risk for impaired skin integrity will decrease Outcome: Progressing   Problem: Tissue Perfusion: Goal: Adequacy of tissue perfusion will improve Outcome: Progressing   Problem: Clinical Measurements: Goal: Ability to maintain clinical measurements within normal limits will improve Outcome: Progressing Goal: Will remain free from infection Outcome: Progressing Goal: Diagnostic test results will improve Outcome: Progressing Goal: Respiratory complications will improve Outcome: Progressing   Problem: Activity: Goal: Risk for activity intolerance will decrease Outcome: Progressing   Problem: Coping: Goal: Level of anxiety will decrease Outcome: Progressing   Problem: Elimination: Goal: Will not experience complications related to bowel motility Outcome: Progressing Goal: Will not experience complications related to urinary retention Outcome: Progressing   Problem: Safety: Goal: Ability to remain free from injury will improve Outcome: Progressing   Problem: Skin Integrity: Goal: Risk for impaired skin integrity will decrease Outcome: Progressing   "

## 2024-06-05 NOTE — Progress Notes (Signed)
 Pt willing and took PO meds his morning, pt still hallucinating and fighting whoever she sees, medicated with valium  med given early after discussion with on call NP Jesus.

## 2024-06-05 NOTE — Progress Notes (Signed)
 " PROGRESS NOTE  Carrie Clark  FMW:[id number] DOB: April 28, 1961 DOA: 06/02/2024 PCP: Lenora Lovena Mason, FNP  Consultants  Brief Narrative: 64 y.o. female with medical history significant of hypertension, hypothyroidism, COPD, CKD 3B, type 2 diabetes mellitus, tobacco dependence, history of CVA, tardive dyskinesia who presents to the emergency department from [facility] via EMS due to altered mental status.   Admitted for acute metabolic encephalopathy in the setting of possible UTI. She is also hypothermic and is on warming blankets. Current temp is 97.2. Hypotension has resolved. She is a resident at [facility] and dependent on home O2.  Evening of 1/9 into AM of 1/10, remained agitated and combative to anyone coming into her room.  Received a shot of geodon  yesterday evening, all AM psych meds, early dose of 10 mg valium .  This AM when I saw her, she was very groggy, difficult to awaken.  Breathing well, good air movement, just very sleepy.  See plan below.     Assessment & Plan: Acute metabolic encephalopathy possibly secondary to UTI - reason for initial admission.  - question of UTI --> treating as such, unfortunately culture never sent.  - she was very groggy this AM after receiving all of her AM meds, she is on a host of psych meds outpt.  Wonder how much polypharmacy playing into original presentation? - decreased her valium  to 5 mg and DC'ed order for Geodon  injection.   - continue home meds at current dosages.  Geodon  was split to 40 mg po bid to help with daytime aggression -- she's on 80 mg at night outpt per pharm records.  - Will Patient was empirically treated with IV vancomycin , cefepime  and Flagyl .   - as far as I can see on review of her micro records, her prior UTI's have all been sensitive to CTX.  Will de-escalate from IV cefepime , with plan to broaden if clinically worsens.    Schizoaffective disorder, bipolar type - see prior notes, especially  nursing notes.  She had missed several doses of her meds and became agitated, paranoid, and combative.   - no very groggy after receiving all home meds plus injection of geodon .   - decreased her BID valium  (home med) to 5 mg in effort to decrease risk of over-sedation, which could be contributing to #1 above.    Hypothermia Patient was placed on Bair hugger Continue to monitor patient's temperature--> resolved.  Likely secondary to UTI above.  Hypotension - resolved IV Solu-Cortef  was given Patient was initially placed on IV Levophed , but has since been weaned off this Now elevated blood pressures are the issue.  Hypertension: - No formal documented history of the same. - Hydralazine  as needed written.  Systolics been rising as her agitation has increased in house. -Could also start amlodipine if needed, review of prior outpatient records showed blood pressures in the 140s to 160s systolic repeatedly. - BPs better, will trend.    Obesity class II (BMI 37.09) Diet and lifestyle modification   COPD  Continue Ventolin  as needed   Mixed hyperlipidemia Continue Lipitor   Type 2 diabetes mellitus, uncontrolled with hyperglycemia A1c on 05/16/24 was 9.7 Continue ISS and hypoglycemia protocol    Acquired hypothyroidism Continue Synthroid    CKD stage 3b Stable,baseline creatinine 1.0-1.3   DVT prophylaxis:  enoxaparin  (LOVENOX ) injection 40 mg Start: 06/03/24 1000 SCDs Start: 06/03/24 0429  Code Status:   Code Status: Full Code Level of care: Med-Surg Status is: Inpatient Dispo:  Home  to group home on Monday, they cannot take her over the weekend.    Subjective: Pt very groggy, difficult to interact with on exam due to excessive sleepiness.    Objective: Vitals:   06/04/24 1853 06/04/24 2256 06/05/24 0318 06/05/24 0605  BP: (!) 161/90 (!) 178/90 (!) 140/52 (!) 153/66  Pulse: 60 (!) 57 (!) 56 61  Resp: 17 18 18 16   Temp: 98.6 F (37 C) 98.1 F (36.7 C) 97.6 F (36.4  C) 97.7 F (36.5 C)  TempSrc: Oral Oral Axillary Oral  SpO2: 93% 90% 92% 91%    Intake/Output Summary (Last 24 hours) at 06/05/2024 1217 Last data filed at 06/05/2024 1143 Gross per 24 hour  Intake 600 ml  Output 2050 ml  Net -1450 ml   There were no vitals filed for this visit. There is no height or weight on file to calculate BMI.  Gen: 64 y.o. female in no apparent distress.  Nontoxic, sitting up in bed asleep.  Good air movement, airway open/patent.  Pulm: Non-labored breathing.  Rhonchi BL bases.  CV: Regular rate and rhythm. No murmur, rub, or gallop. No JVD GI: Abdomen soft, non-tender, non-distended Ext: Warm, no deformities Skin: No rashes, lesions  Neuro: Not easily awakened, falls back asleep easily on exam.  Nontoxic appearing, moving arms and legs symmetrically.  Psych: unable to assess   I have personally reviewed the following labs and images: CBC: Recent Labs  Lab 05/30/24 1222 06/02/24 2206 06/02/24 2208 06/03/24 0538 06/04/24 0815 06/05/24 0535  WBC 7.2 5.6  --  5.3 6.5 5.8  NEUTROABS 4.2 3.5  --   --   --   --   HGB 11.0* 10.8*  --  10.8* 10.2* 10.7*  HCT 35.9* 35.4*  --  34.5* 33.8* 34.7*  MCV 98.6 98.1  --  97.7 99.1 97.7  PLT 192 136* 146* 189 157 151   BMP &GFR Recent Labs  Lab 05/30/24 1222 06/02/24 2206 06/03/24 0620 06/04/24 0815 06/05/24 0535  NA 141 143 140 143 143  K 4.0 4.5 5.0 4.4 3.9  CL 100 99 100 102 102  CO2 31 41* 40* 31 39*  GLUCOSE 145* 148* 186* 152* 113*  BUN 26* 37* 34* 30* 21  CREATININE 1.26* 1.45* 1.25* 1.39* 1.20*  CALCIUM  9.5 9.5 9.1 9.3 9.3  MG 2.1 2.0 1.7  --   --   PHOS  --   --  3.5  --   --    Estimated Creatinine Clearance: 55.3 mL/min (A) (by C-G formula based on SCr of 1.2 mg/dL (H)). Liver & Pancreas: Recent Labs  Lab 05/30/24 1222 06/02/24 2206 06/03/24 0620  AST 24 24 25   ALT 17 16 18   ALKPHOS 144* 140* 141*  BILITOT 0.2 0.2 0.3  PROT 6.6 6.1* 5.8*  ALBUMIN 3.7 3.6 3.5   No results for  input(s): LIPASE, AMYLASE in the last 168 hours. Recent Labs  Lab 06/02/24 2206  AMMONIA 29   Diabetic: No results for input(s): HGBA1C in the last 72 hours. Recent Labs  Lab 06/03/24 2148 06/04/24 0720 06/04/24 2116 06/05/24 0730 06/05/24 1123  GLUCAP 347* 137* 259* 127* 213*   Cardiac Enzymes: No results for input(s): CKTOTAL, CKMB, CKMBINDEX, TROPONINI in the last 168 hours. Recent Labs    05/17/24 1101 06/02/24 2206  PROBNP 481.0* 138.0   Coagulation Profile: Recent Labs  Lab 06/02/24 2208  INR 0.9   Thyroid  Function Tests: No results for input(s): TSH, T4TOTAL, FREET4, T3FREE, THYROIDAB  in the last 72 hours. Lipid Profile: No results for input(s): CHOL, HDL, LDLCALC, TRIG, CHOLHDL, LDLDIRECT in the last 72 hours. Anemia Panel: No results for input(s): VITAMINB12, FOLATE, FERRITIN, TIBC, IRON, RETICCTPCT in the last 72 hours. Urine analysis:    Component Value Date/Time   COLORURINE YELLOW 06/02/2024 2341   APPEARANCEUR HAZY (A) 06/02/2024 2341   LABSPEC 1.011 06/02/2024 2341   PHURINE 5.0 06/02/2024 2341   GLUCOSEU NEGATIVE 06/02/2024 2341   HGBUR MODERATE (A) 06/02/2024 2341   BILIRUBINUR NEGATIVE 06/02/2024 2341   KETONESUR NEGATIVE 06/02/2024 2341   PROTEINUR 30 (A) 06/02/2024 2341   UROBILINOGEN 0.2 12/09/2014 2020   NITRITE NEGATIVE 06/02/2024 2341   LEUKOCYTESUR MODERATE (A) 06/02/2024 2341   Sepsis Labs: Invalid input(s): PROCALCITONIN, LACTICIDVEN  Microbiology: Recent Results (from the past 240 hours)  Blood culture (routine x 2)     Status: None (Preliminary result)   Collection Time: 06/02/24 10:06 PM   Specimen: BLOOD  Result Value Ref Range Status   Specimen Description BLOOD RIGHT ANTECUBITAL  Final   Special Requests   Final    AEROBIC BOTTLE ONLY Blood Culture results may not be optimal due to an inadequate volume of blood received in culture bottles   Culture   Final    NO  GROWTH 3 DAYS Performed at [facility], [address] [place], [place] [address]    Report Status PENDING  Incomplete  Blood culture (routine x 2)     Status: None (Preliminary result)   Collection Time: 06/02/24 10:06 PM   Specimen: BLOOD  Result Value Ref Range Status   Specimen Description BLOOD BLOOD RIGHT FOREARM  Final   Special Requests   Final    AEROBIC BOTTLE ONLY Blood Culture results may not be optimal due to an inadequate volume of blood received in culture bottles   Culture   Final    NO GROWTH 3 DAYS Performed at [facility], [address] [place], [place] [address]    Report Status PENDING  Incomplete    Radiology Studies: No results found.  Scheduled Meds:  atorvastatin   40 mg Oral QHS   Chlorhexidine  Gluconate Cloth  6 each Topical Q0600   donepezil   10 mg Oral QHS   DULoxetine   30 mg Oral Daily   enoxaparin  (LOVENOX ) injection  40 mg Subcutaneous Q24H   furosemide   20 mg Oral Daily   insulin  aspart  0-5 Units Subcutaneous QHS   insulin  aspart  0-9 Units Subcutaneous TID WC   insulin  glargine-yfgn  5 Units Subcutaneous QHS   levothyroxine   100 mcg Oral QAC breakfast   OLANZapine   5 mg Oral BID   Oxcarbazepine   300 mg Oral BID   prazosin   1 mg Oral QHS   traZODone   100 mg Oral QHS   trihexyphenidyl   2 mg Oral QHS   ziprasidone   40 mg Oral BID WC   Continuous Infusions:  ceFEPime  (MAXIPIME ) IV 2 g (06/05/24 0830)   norepinephrine  (LEVOPHED ) Adult infusion Stopped (06/03/24 0001)     LOS: 2 days   35 minutes with more than 50% spent in reviewing records, counseling patient/family and coordinating care.  Reyes VEAR Gaw, MD [facility] [email or web address] 06/05/2024, 12:17 PM    "

## 2024-06-05 NOTE — Plan of Care (Signed)
  Problem: Coping: Goal: Ability to adjust to condition or change in health will improve Outcome: Progressing   Problem: Fluid Volume: Goal: Ability to maintain a balanced intake and output will improve Outcome: Progressing   Problem: Metabolic: Goal: Ability to maintain appropriate glucose levels will improve Outcome: Progressing   Problem: Nutritional: Goal: Maintenance of adequate nutrition will improve Outcome: Progressing   Problem: Skin Integrity: Goal: Risk for impaired skin integrity will decrease Outcome: Progressing   Problem: Tissue Perfusion: Goal: Adequacy of tissue perfusion will improve Outcome: Progressing   Problem: Clinical Measurements: Goal: Ability to maintain clinical measurements within normal limits will improve Outcome: Progressing

## 2024-06-06 DIAGNOSIS — N3000 Acute cystitis without hematuria: Secondary | ICD-10-CM | POA: Diagnosis not present

## 2024-06-06 LAB — GLUCOSE, CAPILLARY
Glucose-Capillary: 145 mg/dL — ABNORMAL HIGH (ref 70–99)
Glucose-Capillary: 140 mg/dL — ABNORMAL HIGH (ref 70–99)
Glucose-Capillary: 268 mg/dL — ABNORMAL HIGH (ref 70–99)
Glucose-Capillary: 206 mg/dL — ABNORMAL HIGH (ref 70–99)

## 2024-06-06 NOTE — Progress Notes (Signed)
 " PROGRESS NOTE  Carrie Clark  FMW:[id number] DOB: 1961-03-12 DOA: 06/02/2024 PCP: Lenora Lovena Mason, FNP  Consultants  Brief Narrative: 64 y.o. female with medical history significant of hypertension, hypothyroidism, COPD, CKD 3B, type 2 diabetes mellitus, tobacco dependence, history of CVA, tardive dyskinesia who presents to the emergency department from [facility] via EMS due to altered mental status.   Admitted for acute metabolic encephalopathy in the setting of possible UTI. She is also hypothermic and is on warming blankets. Current temp is 97.2. Hypotension has resolved. She is a resident at [facility] and dependent on home O2.  Evening of 1/9 into AM of 1/10, remained agitated and combative to anyone coming into her room.  Received a shot of geodon  yesterday evening, all AM psych meds, early dose of 10 mg valium ,, became groggy.  IM Geodon  discontinued, oral Geodon  dose cut in half, home Valium  moved as needed.  More awake and alert and interactive though still with hallucinations since med changes occurred.      Assessment & Plan: Acute metabolic encephalopathy possibly secondary to UTI - reason for initial admission.  - question of UTI --> treating as such, unfortunately culture never sent.  -On ceftriaxone  currently, day 5 of treatment would be 1/12.  Can likely stop antibiotics at that point.  Schizoaffective disorder, bipolar type - She is on the majority of her home medications, still holding her Vraylar .  Geodon  has been cut to half her home dose.  Less groggy than she has been and still with mild hallucinations but no further agitation.  Garbled speech is somewhat hard to understand but she can make herself understood -Hard of hearing. - decreased her BID valium  (home med) to 5 mg in effort to decrease risk of over-sedation, which could have been  contributing to #1 above.    Hypothermia Patient was placed on Bair hugger initially Continue to  monitor patient's temperature--> resolved.  Likely secondary to UTI above.  Hypotension - resolved IV Solu-Cortef  was given Patient was initially placed on IV Levophed , but has since been weaned off this Now elevated blood pressures are the issue.  Elevated BP - No formal documented history of the same. - Hydralazine  as needed written.  Systolics been rising as her agitation has increased in house. - better yesterday, now rising again.  Will hold on treatment and just use hydralazine  as needed as well.   Obesity class II (BMI 37.09) Diet and lifestyle modification   COPD  Continue Ventolin  as needed   Mixed hyperlipidemia Continue Lipitor   Type 2 diabetes mellitus, uncontrolled with hyperglycemia A1c on 05/16/24 was 9.7 Continue ISS and hypoglycemia protocol    Acquired hypothyroidism Continue Synthroid    CKD stage 3b Stable,baseline creatinine 1.0-1.3   DVT prophylaxis:  enoxaparin  (LOVENOX ) injection 40 mg Start: 06/03/24 1000 SCDs Start: 06/03/24 0429  Code Status:   Code Status: Full Code Level of care: Med-Surg Status is: Inpatient Dispo:  Home to group home on Monday, they cannot take her over the weekend.    Subjective: Patient more awake today.  Some garbled speech which is hard to understand but she could make herself understood after some time.  No further agitation.  Still actively hallucinating.  Objective: Vitals:   06/06/24 0945 06/06/24 0947 06/06/24 0953 06/06/24 1214  BP:    (!) 167/64  Pulse: (!) 56 (!) 54 (!) 48 (!) 52  Resp:    19  Temp:    97.8 F (36.6 C)  TempSrc:  Oral  SpO2: (!) 89% (!) 86%  93%    Intake/Output Summary (Last 24 hours) at 06/06/2024 1421 Last data filed at 06/06/2024 1244 Gross per 24 hour  Intake 1060 ml  Output 950 ml  Net 110 ml   There were no vitals filed for this visit. There is no height or weight on file to calculate BMI.  Gen: 64 y.o. female in no apparent distress.  Nontoxic, sitting up in bed  eating breakfast, still a little slumped in bed.  Good air movement, airway open/patent.  Pulm: Non-labored breathing.  Rhonchi BL bases.  CV: Regular rate and rhythm.  GI: Abdomen soft, non-tender, non-distended Ext: Warm, no deformities Skin: No rashes, lesions  Neuro: Garbled speech.  Oriented to person and place.  Moving arms and legs symmetrically without focal deficits. Psych: unable to assess   I have personally reviewed the following labs and images: CBC: Recent Labs  Lab 06/02/24 2206 06/02/24 2208 06/03/24 0538 06/04/24 0815 06/05/24 0535  WBC 5.6  --  5.3 6.5 5.8  NEUTROABS 3.5  --   --   --   --   HGB 10.8*  --  10.8* 10.2* 10.7*  HCT 35.4*  --  34.5* 33.8* 34.7*  MCV 98.1  --  97.7 99.1 97.7  PLT 136* 146* 189 157 151   BMP &GFR Recent Labs  Lab 06/02/24 2206 06/03/24 0620 06/04/24 0815 06/05/24 0535  NA 143 140 143 143  K 4.5 5.0 4.4 3.9  CL 99 100 102 102  CO2 41* 40* 31 39*  GLUCOSE 148* 186* 152* 113*  BUN 37* 34* 30* 21  CREATININE 1.45* 1.25* 1.39* 1.20*  CALCIUM  9.5 9.1 9.3 9.3  MG 2.0 1.7  --   --   PHOS  --  3.5  --   --    Estimated Creatinine Clearance: 55.3 mL/min (A) (by C-G formula based on SCr of 1.2 mg/dL (H)). Liver & Pancreas: Recent Labs  Lab 06/02/24 2206 06/03/24 0620  AST 24 25  ALT 16 18  ALKPHOS 140* 141*  BILITOT 0.2 0.3  PROT 6.1* 5.8*  ALBUMIN 3.6 3.5   No results for input(s): LIPASE, AMYLASE in the last 168 hours. Recent Labs  Lab 06/02/24 2206  AMMONIA 29   Diabetic: No results for input(s): HGBA1C in the last 72 hours. Recent Labs  Lab 06/05/24 1123 06/05/24 1630 06/05/24 2114 06/06/24 0759 06/06/24 1116  GLUCAP 213* 274* 149* 145* 140*   Cardiac Enzymes: No results for input(s): CKTOTAL, CKMB, CKMBINDEX, TROPONINI in the last 168 hours. Recent Labs    05/17/24 1101 06/02/24 2206  PROBNP 481.0* 138.0   Coagulation Profile: Recent Labs  Lab 06/02/24 2208  INR 0.9   Thyroid   Function Tests: No results for input(s): TSH, T4TOTAL, FREET4, T3FREE, THYROIDAB in the last 72 hours. Lipid Profile: No results for input(s): CHOL, HDL, LDLCALC, TRIG, CHOLHDL, LDLDIRECT in the last 72 hours. Anemia Panel: No results for input(s): VITAMINB12, FOLATE, FERRITIN, TIBC, IRON, RETICCTPCT in the last 72 hours. Urine analysis:    Component Value Date/Time   COLORURINE YELLOW 06/02/2024 2341   APPEARANCEUR HAZY (A) 06/02/2024 2341   LABSPEC 1.011 06/02/2024 2341   PHURINE 5.0 06/02/2024 2341   GLUCOSEU NEGATIVE 06/02/2024 2341   HGBUR MODERATE (A) 06/02/2024 2341   BILIRUBINUR NEGATIVE 06/02/2024 2341   KETONESUR NEGATIVE 06/02/2024 2341   PROTEINUR 30 (A) 06/02/2024 2341   UROBILINOGEN 0.2 12/09/2014 2020   NITRITE NEGATIVE 06/02/2024 2341   LEUKOCYTESUR  MODERATE (A) 06/02/2024 2341   Sepsis Labs: Invalid input(s): PROCALCITONIN, LACTICIDVEN  Microbiology: Recent Results (from the past 240 hours)  Blood culture (routine x 2)     Status: None (Preliminary result)   Collection Time: 06/02/24 10:06 PM   Specimen: BLOOD  Result Value Ref Range Status   Specimen Description BLOOD RIGHT ANTECUBITAL  Final   Special Requests   Final    AEROBIC BOTTLE ONLY Blood Culture results may not be optimal due to an inadequate volume of blood received in culture bottles   Culture   Final    NO GROWTH 4 DAYS Performed at [facility], [address] [place], [place] [address]    Report Status PENDING  Incomplete  Blood culture (routine x 2)     Status: None (Preliminary result)   Collection Time: 06/02/24 10:06 PM   Specimen: BLOOD  Result Value Ref Range Status   Specimen Description BLOOD BLOOD RIGHT FOREARM  Final   Special Requests   Final    AEROBIC BOTTLE ONLY Blood Culture results may not be optimal due to an inadequate volume of blood received in culture bottles   Culture   Final    NO GROWTH 4 DAYS Performed at [facility], [address] [place], [place] [address]    Report Status PENDING  Incomplete    Radiology Studies: No results found.  Scheduled Meds:  atorvastatin   40 mg Oral QHS   Chlorhexidine  Gluconate Cloth  6 each Topical Q0600   donepezil   10 mg Oral QHS   DULoxetine   30 mg Oral Daily   enoxaparin  (LOVENOX ) injection  40 mg Subcutaneous Q24H   furosemide   20 mg Oral Daily   insulin  aspart  0-5 Units Subcutaneous QHS   insulin  aspart  0-9 Units Subcutaneous TID WC   insulin  glargine-yfgn  5 Units Subcutaneous QHS   levothyroxine   100 mcg Oral QAC breakfast   OLANZapine   5 mg Oral BID   Oxcarbazepine   300 mg Oral BID   prazosin   1 mg Oral QHS   traZODone   100 mg Oral QHS   trihexyphenidyl   2 mg Oral QHS   ziprasidone   40 mg Oral BID WC   Continuous Infusions:  cefTRIAXone  (ROCEPHIN )  IV Stopped (06/05/24 1839)   norepinephrine  (LEVOPHED ) Adult infusion Stopped (06/03/24 0001)     LOS: 3 days   35 minutes with more than 50% spent in reviewing records, counseling patient/family and coordinating care.  Reyes VEAR Gaw, MD [facility] [email or web address] 06/06/2024, 2:21 PM    "

## 2024-06-06 NOTE — Plan of Care (Signed)
" °  Problem: Coping: Goal: Ability to adjust to condition or change in health will improve Outcome: Progressing   Problem: Fluid Volume: Goal: Ability to maintain a balanced intake and output will improve Outcome: Progressing   Problem: Metabolic: Goal: Ability to maintain appropriate glucose levels will improve Outcome: Progressing   Problem: Nutritional: Goal: Maintenance of adequate nutrition will improve Outcome: Progressing   Problem: Skin Integrity: Goal: Risk for impaired skin integrity will decrease Outcome: Progressing   Problem: Tissue Perfusion: Goal: Adequacy of tissue perfusion will improve Outcome: Progressing   Problem: Clinical Measurements: Goal: Ability to maintain clinical measurements within normal limits will improve Outcome: Progressing Goal: Will remain free from infection Outcome: Progressing Goal: Diagnostic test results will improve Outcome: Progressing Goal: Respiratory complications will improve Outcome: Progressing   Problem: Activity: Goal: Risk for activity intolerance will decrease Outcome: Progressing   Problem: Coping: Goal: Level of anxiety will decrease Outcome: Progressing   Problem: Elimination: Goal: Will not experience complications related to bowel motility Outcome: Progressing Goal: Will not experience complications related to urinary retention Outcome: Progressing   Problem: Safety: Goal: Ability to remain free from injury will improve Outcome: Progressing   Problem: Skin Integrity: Goal: Risk for impaired skin integrity will decrease Outcome: Progressing   "

## 2024-06-07 DIAGNOSIS — N3000 Acute cystitis without hematuria: Secondary | ICD-10-CM | POA: Diagnosis not present

## 2024-06-07 DIAGNOSIS — N390 Urinary tract infection, site not specified: Secondary | ICD-10-CM | POA: Diagnosis not present

## 2024-06-07 LAB — GLUCOSE, CAPILLARY
Glucose-Capillary: 154 mg/dL — ABNORMAL HIGH (ref 70–99)
Glucose-Capillary: 194 mg/dL — ABNORMAL HIGH (ref 70–99)
Glucose-Capillary: 183 mg/dL — ABNORMAL HIGH (ref 70–99)

## 2024-06-07 LAB — CULTURE, BLOOD (ROUTINE X 2)
Culture: NO GROWTH
Culture: NO GROWTH

## 2024-06-07 MED ORDER — OXCARBAZEPINE 300 MG PO TABS: 300.0000 mg | ORAL_TABLET | Freq: Two times a day (BID) | ORAL | 0 refills | Status: AC

## 2024-06-07 MED ORDER — FAMOTIDINE 20 MG PO TABS
20.0000 mg | ORAL_TABLET | Freq: Two times a day (BID) | ORAL | Status: DC
  Administered 2024-06-07 – 2024-06-16 (×15): 20 mg via ORAL
  Filled 2024-06-07 (×18): qty 1

## 2024-06-07 MED ORDER — CARIPRAZINE HCL 1.5 MG PO CAPS
4.5000 mg | ORAL_CAPSULE | Freq: Every day | ORAL | Status: DC
  Administered 2024-06-07 – 2024-06-16 (×9): 4.5 mg via ORAL
  Filled 2024-06-07 (×11): qty 3

## 2024-06-07 MED ORDER — OLANZAPINE 2.5 MG PO TABS: 5.0000 mg | ORAL_TABLET | Freq: Two times a day (BID) | ORAL | 0 refills | Status: DC

## 2024-06-07 MED ORDER — TRIHEXYPHENIDYL HCL 2 MG PO TABS: 2.0000 mg | ORAL_TABLET | Freq: Every day | ORAL | 0 refills | Status: AC

## 2024-06-07 MED ORDER — ZIPRASIDONE HCL 40 MG PO CAPS: 40.0000 mg | ORAL_CAPSULE | Freq: Two times a day (BID) | ORAL | 0 refills | Status: DC

## 2024-06-07 MED ORDER — DIAZEPAM 5 MG PO TABS: 5.0000 mg | ORAL_TABLET | Freq: Two times a day (BID) | ORAL | 0 refills | Status: DC | PRN

## 2024-06-07 NOTE — Progress Notes (Signed)
 " PROGRESS NOTE    Carrie Clark  FMW:[id number] DOB: 03-23-1961 DOA: 06/02/2024 PCP: Lenora Lovena Mason, FNP    Brief Narrative:   64 y.o. female with medical history significant of hypertension, hypothyroidism, COPD, CKD 3B, type 2 diabetes mellitus, tobacco dependence, history of CVA, tardive dyskinesia who presents to the emergency department from [facility] via EMS due to altered mental status.    Admitted for acute metabolic encephalopathy in the setting of possible UTI. She is also hypothermic and is on warming blankets. Current temp is 97.2. Hypotension has resolved. She is a resident at [facility] and dependent on home O2.   Evening of 1/9 into AM of 1/10, remained agitated and combative to anyone coming into her room.  Received a shot of geodon  yesterday evening, all AM psych meds, early dose of 10 mg valium ,, became groggy.  IM Geodon  discontinued, oral Geodon  dose cut in half, home Valium  moved as needed.  More awake and alert and interactive though still with hallucinations since med changes occurred.     Assessment & Plan:   Acute metabolic encephalopathy possibly secondary to UTI - reason for initial admission.  Treated with 5 days of IV antibiotics.   Schizoaffective disorder, bipolar type Agitation Initially due to agitation she received higher doses of Geodon  and Valium  causing some overt sedation.  Slowly now that been adjusted, will slowly uptitrate as deemed appropriate.  Current medications: Cymbalta  30 mg daily Zyprexa  5 mg twice daily Trileptal  300 mg twice daily Cariprazine  4.5mg  daily Geodon  40 mg twice daily (PTA 80mg  at bedtime) Valium  5 mg twice daily as needed (PTA scheduled)    Hypothermia; resolved.  Patient was placed on Bair hugger initially Continue to monitor patient's temperature--> resolved.  Likely secondary to UTI above.   Hypotension - resolved Received steroids and briefly was on Levophed .   Elevated BP - No  formal documented history of the same. - Hydralazine  as needed written.  Systolics been rising as her agitation has increased in house. - better yesterday, now rising again.  Will hold on treatment and just use hydralazine  as needed as well.   Obesity class II (BMI 37.09) Diet and lifestyle modification   COPD  Continue Ventolin  as needed   Mixed hyperlipidemia Continue Lipitor   Type 2 diabetes mellitus, uncontrolled with hyperglycemia A1c on 05/16/24 was 9.7 Continue ISS and hypoglycemia protocol    Acquired hypothyroidism Continue Synthroid    CKD stage 3b Stable,baseline creatinine 1.0-1.3   DVT prophylaxis: enoxaparin    Code Status:   Code Status: Full Code Level of care: Med-Surg Status is: Inpatient Dispo: Overall stable for discharge PT Follow up Recs:   Subjective:  Seen at bedside sitting up in the recliner does not have any complaints.  She ate her breakfast.  Examination:  General exam: Appears calm and comfortable  Respiratory system: Clear to auscultation. Respiratory effort normal. Cardiovascular system: S1 & S2 heard, RRR. No JVD, murmurs, rubs, gallops or clicks. No pedal edema. Gastrointestinal system: Abdomen is nondistended, soft and nontender. No organomegaly or masses felt. Normal bowel sounds heard. Central nervous system: Alert and oriented. No focal neurological deficits. Extremities: Symmetric 5 x 5 power. Skin: No rashes, lesions or ulcers Psychiatry: Judgement and insight appear poor                Diet Orders (From admission, onward)     Start     Ordered   06/03/24 0431  Diet heart healthy/carb modified Room service appropriate?  Yes; Fluid consistency: Thin  Diet effective now       Question Answer Comment  Diet-HS Snack? Nothing   Room service appropriate? Yes   Fluid consistency: Thin      06/03/24 0432            Objective: Vitals:   06/06/24 1818 06/06/24 1821 06/06/24 2057 06/07/24 0421  BP:  (!) 178/75  (!) 140/62 (!) 133/51  Pulse: (!) 48 (!) 52 (!) 59 (!) 54  Resp:   18 20  Temp:   (!) 97.4 F (36.3 C) 98.3 F (36.8 C)  TempSrc:   Axillary   SpO2:  91% 92% 93%    Intake/Output Summary (Last 24 hours) at 06/07/2024 1100 Last data filed at 06/07/2024 9374 Gross per 24 hour  Intake 340 ml  Output 1600 ml  Net -1260 ml   There were no vitals filed for this visit.  Scheduled Meds:  atorvastatin   40 mg Oral QHS   cariprazine   4.5 mg Oral Daily   Chlorhexidine  Gluconate Cloth  6 each Topical Q0600   donepezil   10 mg Oral QHS   DULoxetine   30 mg Oral Daily   enoxaparin  (LOVENOX ) injection  40 mg Subcutaneous Q24H   famotidine   20 mg Oral BID   furosemide   20 mg Oral Daily   insulin  aspart  0-5 Units Subcutaneous QHS   insulin  aspart  0-9 Units Subcutaneous TID WC   insulin  glargine-yfgn  5 Units Subcutaneous QHS   levothyroxine   100 mcg Oral QAC breakfast   OLANZapine   5 mg Oral BID   Oxcarbazepine   300 mg Oral BID   prazosin   1 mg Oral QHS   traZODone   100 mg Oral QHS   trihexyphenidyl   2 mg Oral QHS   ziprasidone   40 mg Oral BID WC   Continuous Infusions:  cefTRIAXone  (ROCEPHIN )  IV Stopped (06/06/24 1858)    Nutritional status     There is no height or weight on file to calculate BMI.  Data Reviewed:   CBC: Recent Labs  Lab 06/02/24 2206 06/02/24 2208 06/03/24 0538 06/04/24 0815 06/05/24 0535  WBC 5.6  --  5.3 6.5 5.8  NEUTROABS 3.5  --   --   --   --   HGB 10.8*  --  10.8* 10.2* 10.7*  HCT 35.4*  --  34.5* 33.8* 34.7*  MCV 98.1  --  97.7 99.1 97.7  PLT 136* 146* 189 157 151   Basic Metabolic Panel: Recent Labs  Lab 06/02/24 2206 06/03/24 0620 06/04/24 0815 06/05/24 0535  NA 143 140 143 143  K 4.5 5.0 4.4 3.9  CL 99 100 102 102  CO2 41* 40* 31 39*  GLUCOSE 148* 186* 152* 113*  BUN 37* 34* 30* 21  CREATININE 1.45* 1.25* 1.39* 1.20*  CALCIUM  9.5 9.1 9.3 9.3  MG 2.0 1.7  --   --   PHOS  --  3.5  --   --    GFR: Estimated Creatinine  Clearance: 55.3 mL/min (A) (by C-G formula based on SCr of 1.2 mg/dL (H)). Liver Function Tests: Recent Labs  Lab 06/02/24 2206 06/03/24 0620  AST 24 25  ALT 16 18  ALKPHOS 140* 141*  BILITOT 0.2 0.3  PROT 6.1* 5.8*  ALBUMIN 3.6 3.5   No results for input(s): LIPASE, AMYLASE in the last 168 hours. Recent Labs  Lab 06/02/24 2206  AMMONIA 29   Coagulation Profile: Recent Labs  Lab 06/02/24 2208  INR 0.9  Cardiac Enzymes: No results for input(s): CKTOTAL, CKMB, CKMBINDEX, TROPONINI in the last 168 hours. BNP (last 3 results) Recent Labs    05/17/24 1101 06/02/24 2206  PROBNP 481.0* 138.0   HbA1C: No results for input(s): HGBA1C in the last 72 hours. CBG: Recent Labs  Lab 06/06/24 0759 06/06/24 1116 06/06/24 1612 06/06/24 1956 06/07/24 0726  GLUCAP 145* 140* 268* 206* 154*   Lipid Profile: No results for input(s): CHOL, HDL, LDLCALC, TRIG, CHOLHDL, LDLDIRECT in the last 72 hours. Thyroid  Function Tests: No results for input(s): TSH, T4TOTAL, FREET4, T3FREE, THYROIDAB in the last 72 hours. Anemia Panel: No results for input(s): VITAMINB12, FOLATE, FERRITIN, TIBC, IRON, RETICCTPCT in the last 72 hours. Sepsis Labs: Recent Labs  Lab 06/02/24 2206  LATICACIDVEN 1.4    Recent Results (from the past 240 hours)  Blood culture (routine x 2)     Status: None   Collection Time: 06/02/24 10:06 PM   Specimen: BLOOD  Result Value Ref Range Status   Specimen Description BLOOD RIGHT ANTECUBITAL  Final   Special Requests   Final    AEROBIC BOTTLE ONLY Blood Culture results may not be optimal due to an inadequate volume of blood received in culture bottles   Culture   Final    NO GROWTH 5 DAYS Performed at [facility], [address], [place], [place] [address]    Report Status 06/07/2024 FINAL  Final  Blood culture (routine x 2)     Status: None   Collection Time: 06/02/24 10:06 PM   Specimen: BLOOD  Result Value  Ref Range Status   Specimen Description BLOOD BLOOD RIGHT FOREARM  Final   Special Requests   Final    AEROBIC BOTTLE ONLY Blood Culture results may not be optimal due to an inadequate volume of blood received in culture bottles   Culture   Final    NO GROWTH 5 DAYS Performed at [facility], [address], [place], [place] [address]    Report Status 06/07/2024 FINAL  Final         Radiology Studies: No results found.         LOS: 4 days   Time spent= 35 mins    Burgess JAYSON Dare, MD [facility]  If 7PM-7AM, please contact night-coverage  06/07/2024, 11:00 AM  "

## 2024-06-07 NOTE — Hospital Course (Addendum)
 Brief Narrative:   64 y.o. female with medical history significant of hypertension, hypothyroidism, COPD, CKD 3B, type 2 diabetes mellitus, tobacco dependence, history of CVA, tardive dyskinesia who presents to the emergency department from [facility] via EMS due to altered mental status.    Admitted for acute metabolic encephalopathy in the setting of possible UTI. She is also hypothermic and is on warming blankets. Current temp is 97.2. Hypotension has resolved. She is a resident at [facility] and dependent on home O2.   Evening of 1/9 into AM of 1/10, remained agitated and combative to anyone coming into her room.  Received a shot of geodon  yesterday evening, all AM psych meds, early dose of 10 mg valium ,, became groggy.  IM Geodon  discontinued, oral Geodon  dose cut in half, home Valium  moved as needed.  More awake and alert and interactive though still with hallucinations since med changes occurred.     Assessment & Plan:   Acute metabolic encephalopathy possibly secondary to UTI - reason for initial admission.  Treated with 5 days of IV antibiotics.   Schizoaffective disorder, bipolar type Agitation Initially due to agitation she received higher doses of Geodon  and Valium  causing some overt sedation.  Slowly now that been adjusted, will slowly uptitrate as deemed appropriate.  Current medications: Cymbalta  30 mg daily Zyprexa  5 mg twice daily Trileptal  300 mg twice daily Cariprazine  4.5mg  daily Geodon  40 mg twice daily (PTA 80mg  at bedtime) Valium  5 mg twice daily as needed (PTA scheduled)    Hypothermia; resolved.  Patient was placed on Bair hugger initially Continue to monitor patient's temperature--> resolved.  Likely secondary to UTI above.   Hypotension - resolved Received steroids and briefly was on Levophed .   Elevated BP - No formal documented history of the same. - Hydralazine  as needed written.  Systolics been rising as her agitation has  increased in house. - better yesterday, now rising again.  Will hold on treatment and just use hydralazine  as needed as well.   Obesity class II (BMI 37.09) Diet and lifestyle modification   COPD  Continue Ventolin  as needed   Mixed hyperlipidemia Continue Lipitor   Type 2 diabetes mellitus, uncontrolled with hyperglycemia A1c on 05/16/24 was 9.7 Continue ISS and hypoglycemia protocol    Acquired hypothyroidism Continue Synthroid    CKD stage 3b Stable,baseline creatinine 1.0-1.3   DVT prophylaxis: enoxaparin    Code Status:   Code Status: Full Code Level of care: Med-Surg Status is: Inpatient Dispo: Overall stable for discharge PT Follow up Recs:   Subjective:  Seen at bedside sitting up in the recliner does not have any complaints.  She ate her breakfast.  Examination:  General exam: Appears calm and comfortable  Respiratory system: Clear to auscultation. Respiratory effort normal. Cardiovascular system: S1 & S2 heard, RRR. No JVD, murmurs, rubs, gallops or clicks. No pedal edema. Gastrointestinal system: Abdomen is nondistended, soft and nontender. No organomegaly or masses felt. Normal bowel sounds heard. Central nervous system: Alert and oriented. No focal neurological deficits. Extremities: Symmetric 5 x 5 power. Skin: No rashes, lesions or ulcers Psychiatry: Judgement and insight appear poor

## 2024-06-07 NOTE — TOC Progression Note (Signed)
 Transition of Care [facility]) - Progression Note    Patient Details  Name: Carrie Clark MRN: [id number] Date of Birth: [birth year]  Transition of [facility]) CM/SW Contact  Noreen KATHEE [place], [place] Phone Number: 06/07/2024, 2:58 PM  Clinical Narrative:    CSW spoke with Randie and made her aware that patient was recommended for SNF. Randie provide CSW with patient [facility]- [facility] Candy contact info. CSW called lee and he gave CSW permission to send patient referral out to [facility].   Expected Discharge Plan: Skilled Nursing Facility Barriers to Discharge: No SNF bed, Continued Medical Work up, English As A Second Language Teacher       Expected Discharge Plan and Services In-house Referral: Clinical Social Work Discharge Planning Services: CM Consult   Living arrangements for the past 2 months: Group Home                                       Social Drivers of Health (SDOH) Interventions SDOH Screenings   Food Insecurity: No Food Insecurity (06/05/2024)  Housing: Patient Unable To Answer (06/05/2024)  Transportation Needs: No Transportation Needs (06/05/2024)  Utilities: Not At Risk (06/05/2024)  Tobacco Use: Low Risk (06/03/2024)    Readmission Risk Interventions    06/07/2024    2:57 PM 06/04/2024   11:54 AM 06/03/2024    9:57 AM  Readmission Risk Prevention Plan  Transportation Screening Complete Complete Complete  HRI or Home Care Consult Complete Complete Complete  Social Work Consult for Recovery Care Planning/Counseling Complete Complete Complete  Palliative Care Screening Not Applicable Not Applicable Not Applicable  Medication Review Oceanographer) Complete Complete Complete

## 2024-06-07 NOTE — Plan of Care (Signed)
" °  Problem: Acute Rehab PT Goals(only PT should resolve) Goal: Pt Will Go Supine/Side To Sit Outcome: Progressing Flowsheets (Taken 06/07/2024 1550) Pt will go Supine/Side to Sit:  with contact guard assist  with minimal assist Goal: Patient Will Transfer Sit To/From Stand Outcome: Progressing Flowsheets (Taken 06/07/2024 1550) Patient will transfer sit to/from stand:  with contact guard assist  with minimal assist Goal: Pt Will Transfer Bed To Chair/Chair To Bed Outcome: Progressing Flowsheets (Taken 06/07/2024 1550) Pt will Transfer Bed to Chair/Chair to Bed:  with contact guard assist  with min assist Goal: Pt Will Ambulate Outcome: Progressing Flowsheets (Taken 06/07/2024 1550) Pt will Ambulate:  25 feet  with minimal assist  with rolling walker   3:51 PM, 06/07/2024 Lynwood Music, MPT Physical Therapist with [facility] [phone] office [phone] mobile phone  "

## 2024-06-07 NOTE — [place] FL2 (Addendum)
 " [place]  MEDICAID FL2 LEVEL OF CARE FORM     IDENTIFICATION  Patient Name: Carrie Clark Birthdate: 1961/05/14 Sex: female Admission Date (Current Location): 06/02/2024  [facility] and [place] Number:  [employer] and Address:  [facility],  [address] [place] [address]      Provider Number: [id number]  Attending Physician Name and Address:  Caleen Burgess BROCKS, MD  Relative Name and Phone Number:  Jama Candy [facility] DSS ) - [phone]    Current Level of Care: Hospital Recommended Level of Care: Skilled Nursing Facility Prior Approval Number:    Date Approved/Denied:   PASRR Number:  [id number]  Discharge Plan: SNF    Current Diagnoses: Patient Active Problem List   Diagnosis Date Noted   Lobar pneumonia 05/19/2024   Acute respiratory failure with hypoxia (HCC) 05/19/2024   CAP (community acquired pneumonia) 05/17/2024   Community acquired pneumonia 05/17/2024   Pain due to onychomycosis of toenails of both feet 10/18/2020   UTI (urinary tract infection) 12/03/2018   Hypothyroidism 12/03/2018   HTN (hypertension) 12/03/2018   COPD (chronic obstructive pulmonary disease) (HCC) 12/03/2018   Diabetes (HCC) 12/03/2018   Altered mental status 12/03/2018   Schizoaffective disorder, bipolar type (HCC) 09/11/2015   Aggression    Episode of behavior change    Psychoses (HCC)     Orientation RESPIRATION BLADDER Height & Weight     Self  Normal External catheter Weight:   Height:     BEHAVIORAL SYMPTOMS/MOOD NEUROLOGICAL BOWEL NUTRITION STATUS      Incontinent Diet (Heart Healthy)  AMBULATORY STATUS COMMUNICATION OF NEEDS Skin   Extensive Assist Verbally Normal                       Personal Care Assistance Level of Assistance  Bathing, Feeding, Dressing Bathing Assistance: Maximum assistance Feeding assistance: Independent Dressing Assistance: Maximum assistance     Functional Limitations Info  Sight, Hearing, Speech  Sight Info: Adequate Hearing Info: Adequate Speech Info: Impaired    SPECIAL CARE FACTORS FREQUENCY  PT (By licensed PT), OT (By licensed OT)     PT Frequency: 5 x a week OT Frequency: 5 x a week            Contractures Contractures Info: Not present    Additional Factors Info  Code Status, Allergies, Psychotropic Code Status Info: FULL Allergies Info: Fish Allergy Psychotropic Info: Cymbalta , Zyprexa , Trazodone , Geodon , trileptal , cariprazin         Current Medications (06/07/2024):  This is the current hospital active medication list Current Facility-Administered Medications  Medication Dose Route Frequency Provider Last Rate Last Admin   acetaminophen  (TYLENOL ) tablet 650 mg  650 mg Oral Q6H PRN Adefeso, Oladapo, DO       Or   acetaminophen  (TYLENOL ) suppository 650 mg  650 mg Rectal Q6H PRN Adefeso, Oladapo, DO       albuterol  (PROVENTIL ) (2.5 MG/3ML) 0.083% nebulizer solution 2.5 mg  2.5 mg Nebulization Q6H PRN Carolee Browning T, RPH       atorvastatin  (LIPITOR) tablet 40 mg  40 mg Oral QHS Adefeso, Oladapo, DO   40 mg at 06/06/24 2137   cariprazine  (VRAYLAR ) capsule 4.5 mg  4.5 mg Oral Daily Amin, Ankit C, MD   4.5 mg at 06/07/24 1215   cefTRIAXone  (ROCEPHIN ) 1 g in sodium chloride  0.9 % 100 mL IVPB  1 g Intravenous Q24H Elpidio Reyes DEL, MD   Stopped at 06/06/24 1858  Chlorhexidine  Gluconate Cloth 2 % PADS 6 each  6 each Topical Q0600 Rashid, Farhan, MD   6 each at 06/07/24 0534   diazepam  (VALIUM ) tablet 5 mg  5 mg Oral Q12H PRN Elpidio Reyes DEL, MD   5 mg at 06/06/24 0403   donepezil  (ARICEPT ) tablet 10 mg  10 mg Oral QHS Elpidio Reyes DEL, MD   10 mg at 06/06/24 2137   DULoxetine  (CYMBALTA ) DR capsule 30 mg  30 mg Oral Daily Adefeso, Oladapo, DO   30 mg at 06/07/24 0855   enoxaparin  (LOVENOX ) injection 40 mg  40 mg Subcutaneous Q24H Adefeso, Oladapo, DO   40 mg at 06/07/24 9144   famotidine  (PEPCID ) tablet 20 mg  20 mg Oral BID Amin, Ankit C, MD   20 mg at  06/07/24 1104   furosemide  (LASIX ) tablet 20 mg  20 mg Oral Daily Elpidio Reyes DEL, MD   20 mg at 06/07/24 0855   hydrALAZINE  (APRESOLINE ) injection 10 mg  10 mg Intravenous Q4H PRN Elpidio Reyes DEL, MD       insulin  aspart (novoLOG ) injection 0-5 Units  0-5 Units Subcutaneous QHS Adefeso, Oladapo, DO   2 Units at 06/06/24 2137   insulin  aspart (novoLOG ) injection 0-9 Units  0-9 Units Subcutaneous TID WC Adefeso, Oladapo, DO   2 Units at 06/07/24 1215   insulin  glargine-yfgn (SEMGLEE ) injection 5 Units  5 Units Subcutaneous QHS Adefeso, Oladapo, DO   5 Units at 06/06/24 2137   levothyroxine  (SYNTHROID ) tablet 100 mcg  100 mcg Oral QAC breakfast Adefeso, Oladapo, DO   100 mcg at 06/07/24 0516   OLANZapine  (ZYPREXA ) tablet 5 mg  5 mg Oral BID Elpidio Reyes DEL, MD   5 mg at 06/07/24 9144   ondansetron  (ZOFRAN ) tablet 4 mg  4 mg Oral Q6H PRN Adefeso, Oladapo, DO       Or   ondansetron  (ZOFRAN ) injection 4 mg  4 mg Intravenous Q6H PRN Adefeso, Oladapo, DO       Oxcarbazepine  (TRILEPTAL ) tablet 300 mg  300 mg Oral BID Elpidio Reyes DEL, MD   300 mg at 06/07/24 0855   prazosin  (MINIPRESS ) capsule 1 mg  1 mg Oral QHS Adefeso, Oladapo, DO   1 mg at 06/06/24 2138   traZODone  (DESYREL ) tablet 100 mg  100 mg Oral QHS Elpidio Reyes DEL, MD   100 mg at 06/06/24 2142   trihexyphenidyl  (ARTANE ) tablet 2 mg  2 mg Oral QHS Elpidio Reyes DEL, MD   2 mg at 06/06/24 2138   ziprasidone  (GEODON ) capsule 40 mg  40 mg Oral BID WC Elpidio Reyes DEL, MD   40 mg at 06/07/24 9144     Discharge Medications: Please see discharge summary for a list of discharge medications.  Relevant Imaging Results:  Relevant Lab Results:   Additional Information SSN: [id number]  Noreen KATHEE [place], [place]     "

## 2024-06-07 NOTE — Evaluation (Signed)
 Physical Therapy Evaluation Patient Details Name: Carrie Clark MRN: [id number] DOB: 12-04-1960 Today's Date: 06/07/2024  History of Present Illness  Carrie Clark is a 64 y.o. female with medical history significant of hypertension, hypothyroidism, COPD, CKD 3B, type 2 diabetes mellitus, tobacco dependence, history of CVA, tardive dyskinesia who presents to the emergency department from [facility] place via EMS due to altered mental status.  EMS was activated due to concern for altered mental status, patient was on few CNS acting drugs  Patient was somnolent at bedside, she was arousable, but quickly goes back to sleep and was unable to provide any history.  History was obtained from EDP and ED medical record.   Clinical Impression  Patient required increased time for sitting up at bedside, very unsteady on feet and limited to a few side steps before having to sit due to loss of balance. Patient tolerated sitting up in chair after therapy. Patient will benefit from continued skilled physical therapy in hospital and recommended venue below to increase strength, balance, endurance for safe ADLs and gait.          If plan is discharge home, recommend the following: A lot of help with bathing/dressing/bathroom;A lot of help with walking and/or transfers;Help with stairs or ramp for entrance;Assist for transportation;Assistance with cooking/housework   Can travel by private vehicle   No    Equipment Recommendations None recommended by PT  Recommendations for Other Services       Functional Status Assessment Patient has had a recent decline in their functional status and demonstrates the ability to make significant improvements in function in a reasonable and predictable amount of time.     Precautions / Restrictions Precautions Precautions: Fall Recall of Precautions/Restrictions: Impaired Restrictions Weight Bearing Restrictions Per Provider Order: No      Mobility  Bed Mobility Overal  bed mobility: Needs Assistance Bed Mobility: Supine to Sit     Supine to sit: Mod assist     General bed mobility comments: increased time, labored movement    Transfers Overall transfer level: Needs assistance Equipment used: Rolling walker (2 wheels) Transfers: Sit to/from Stand, Bed to chair/wheelchair/BSC Sit to Stand: Mod assist   Step pivot transfers: Mod assist       General transfer comment: unsteady labored movement    Ambulation/Gait Ambulation/Gait assistance: Mod assist Gait Distance (Feet): 4 Feet Assistive device: Rolling walker (2 wheels) Gait Pattern/deviations: Step-through pattern, Decreased step length - right, Decreased stride length Gait velocity: slow     General Gait Details: limited to a few slow labored side steps before having to sit due to loss of balance  Stairs            Wheelchair Mobility     Tilt Bed    Modified Rankin (Stroke Patients Only)       Balance Overall balance assessment: Needs assistance Sitting-balance support: Feet supported, No upper extremity supported Sitting balance-Leahy Scale: Fair Sitting balance - Comments: seated at EOB   Standing balance support: Reliant on assistive device for balance, During functional activity, Bilateral upper extremity supported Standing balance-Leahy Scale: Poor Standing balance comment: using RW                             Pertinent Vitals/Pain Pain Assessment Pain Assessment: No/denies pain    Home Living Family/patient expects to be discharged to:: Group home  Home Equipment: Agricultural Consultant (2 wheels);Wheelchair - manual Additional Comments: Patient is poor historian, from group home per chart    Prior Function Prior Level of Function : Needs assist       Physical Assist : Mobility (physical);ADLs (physical) Mobility (physical): Bed mobility;Transfers;Gait;Stairs   Mobility Comments: household ambulaton using RW ADLs  Comments: Assisted by group home staff     Extremity/Trunk Assessment   Upper Extremity Assessment Upper Extremity Assessment: Generalized weakness    Lower Extremity Assessment Lower Extremity Assessment: Generalized weakness    Cervical / Trunk Assessment Cervical / Trunk Assessment: Kyphotic  Communication   Communication Communication: No apparent difficulties    Cognition Arousal: Alert Behavior During Therapy: Flat affect                             Following commands: Intact       Cueing Cueing Techniques: Verbal cues, Tactile cues     General Comments      Exercises     Assessment/Plan    PT Assessment Patient needs continued PT services  PT Problem List Decreased strength;Decreased activity tolerance;Decreased balance;Decreased mobility       PT Treatment Interventions DME instruction;Gait training;Stair training;Functional mobility training;Therapeutic activities;Therapeutic exercise;Balance training;Patient/family education    PT Goals (Current goals can be found in the Care Plan section)  Acute Rehab PT Goals Patient Stated Goal: return home PT Goal Formulation: With patient Time For Goal Achievement: 06/21/24 Potential to Achieve Goals: Good    Frequency Min 3X/week     Co-evaluation               AM-PAC PT 6 Clicks Mobility  Outcome Measure Help needed turning from your back to your side while in a flat bed without using bedrails?: A Little Help needed moving from lying on your back to sitting on the side of a flat bed without using bedrails?: A Lot Help needed moving to and from a bed to a chair (including a wheelchair)?: A Lot Help needed standing up from a chair using your arms (e.g., wheelchair or bedside chair)?: A Lot Help needed to walk in hospital room?: A Lot Help needed climbing 3-5 steps with a railing? : A Lot 6 Click Score: 13    End of Session   Activity Tolerance: Patient tolerated treatment  well;Patient limited by fatigue Patient left: in chair;with call bell/phone within reach;with chair alarm set Nurse Communication: Mobility status PT Visit Diagnosis: Unsteadiness on feet (R26.81);Other abnormalities of gait and mobility (R26.89);Muscle weakness (generalized) (M62.81)    Time: 9082-9058 PT Time Calculation (min) (ACUTE ONLY): 24 min   Charges:   PT Evaluation $PT Eval Moderate Complexity: 1 Mod PT Treatments $Therapeutic Activity: 23-37 mins PT General Charges $$ ACUTE PT VISIT: 1 Visit         3:49 PM, 06/07/2024 Lynwood Music, MPT Physical Therapist with [facility] [phone] office [phone] mobile phone
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
## 2024-06-08 ENCOUNTER — Encounter (HOSPITAL_COMMUNITY): Payer: Self-pay | Admitting: Internal Medicine

## 2024-06-08 DIAGNOSIS — N3000 Acute cystitis without hematuria: Secondary | ICD-10-CM | POA: Diagnosis not present

## 2024-06-08 LAB — GLUCOSE, CAPILLARY
Glucose-Capillary: 206 mg/dL — ABNORMAL HIGH (ref 70–99)
Glucose-Capillary: 151 mg/dL — ABNORMAL HIGH (ref 70–99)
Glucose-Capillary: 115 mg/dL — ABNORMAL HIGH (ref 70–99)
Glucose-Capillary: 128 mg/dL — ABNORMAL HIGH (ref 70–99)

## 2024-06-08 MED ORDER — OLANZAPINE 5 MG PO TABS
10.0000 mg | ORAL_TABLET | Freq: Every day | ORAL | Status: DC
  Administered 2024-06-08 – 2024-06-15 (×7): 10 mg via ORAL
  Filled 2024-06-08 (×8): qty 2

## 2024-06-08 MED ORDER — OLANZAPINE 5 MG PO TABS
5.0000 mg | ORAL_TABLET | Freq: Every day | ORAL | Status: DC
  Administered 2024-06-09 – 2024-06-16 (×7): 5 mg via ORAL
  Filled 2024-06-08 (×9): qty 1

## 2024-06-08 MED ORDER — DIAZEPAM 5 MG/ML IJ SOLN
2.0000 mg | Freq: Once | INTRAMUSCULAR | Status: AC | PRN
  Administered 2024-06-08: 2 mg via INTRAVENOUS
  Filled 2024-06-08: qty 2

## 2024-06-08 MED ORDER — DIAZEPAM 5 MG/ML IJ SOLN
2.0000 mg | Freq: Once | INTRAMUSCULAR | Status: DC | PRN
  Filled 2024-06-08: qty 2

## 2024-06-08 MED ORDER — OLANZAPINE 10 MG IM SOLR: 5.0000 mg | Freq: Once | INTRAMUSCULAR | Status: AC | PRN

## 2024-06-08 MED ORDER — OLANZAPINE 10 MG IM SOLR
INTRAMUSCULAR | Status: AC
  Administered 2024-06-08: 5 mg via INTRAMUSCULAR
  Filled 2024-06-08: qty 10

## 2024-06-08 NOTE — Progress Notes (Addendum)
"  ° °      Overnight   NAME: Carrie Clark MRN: [id number] DOB : February 27, 1961    Date of Service   06/08/2024   HPI/Events of Note    Notified by Nursing staff for agitation and some combative/threatening behaviors.  Through various point tonight , she has refused to take medications and had prolonged boisterous/aggressive periods. Patient has refused multiple Nursing staff approached with PRN or scheduled medications (orally)     Interventions/ Plan   IV dosing of scheduled medication for anxiety/aggression. Continue Attending orders      Update 0551 hrs.  Notified by nursing staff for severe agitation, aggressive behavior, attempting to wander around the room and out of bed refusing to be assisted, or return to the bed. Patient has rambling thoughts and speech, hallucinations, aggressive behavior.  Patient has a history of schizoaffective disorder, bipolar type, schizophrenia.  Patient varies between answering questions, and aggressive speech including nondirect threatening comments, profanity, aggressive actions, and striking at staff. Patient also directs profanity, and threats that existent objects and entities, and will return to appropriate answers occasionally intermittently.  Patient is on scheduled Zyprexa .  At this time we will use equivalent dosing IM. due to aggressive nature of patient's behavior and inability to give prescribed, or alternate (IV) route.  Update [id number] Patient still has fluctuating, rambling speech and rambling, however reduced aggression towards staff. Zyprexa  IM is at 30 minutes post administration.  Patient is currently sitting in a recliner with a chair alarm and RN. A sitter is ordered.     Lynwood Kipper BSN MSNA MSN ACNPC-AG Acute Care Nurse Practitioner [facility]  "

## 2024-06-08 NOTE — Progress Notes (Signed)
 Patient very anxious, agitated, restless, yelling out constantly and combative with staff this morning. She took all of her scheduled meds as well as PRN anxiety med. She calmed down after taking meds. NT and myself got her up from her chair and ambulated her to the bathroom, where she had a BM and voided. She agreed to lay down to rest at that time. Assisted her to the bed and she went to sleep. Still resting quietly at this time. Safety sitter order discontinued.

## 2024-06-08 NOTE — TOC Progression Note (Signed)
 Transition of Care [facility]) - Progression Note    Patient Details  Name: Carrie Clark MRN: [id number] Date of Birth: [birth year]  Transition of [facility]) CM/SW Contact  Carrie DELENA Bigness, LCSW Phone Number: 06/08/2024, 10:06 AM  Clinical Narrative:    CSW reviewed bed offer for SNF w/ pt's legal guardian, Jama Candy [phone]). Mr Candy has accepted offer for placement at [facility]. Insurance auth being requested.   [facility] [address] [place], [place] [address] [phone] Overall rating ?   Expected Discharge Plan: Skilled Nursing Facility Barriers to Discharge: No SNF bed, Continued Medical Work up, English As A Second Language Teacher               Expected Discharge Plan and Services In-house Referral: Clinical Social Work Discharge Planning Services: CM Consult   Living arrangements for the past 2 months: Group Home                                       Social Drivers of Health (SDOH) Interventions SDOH Screenings   Food Insecurity: No Food Insecurity (06/05/2024)  Housing: Patient Unable To Answer (06/05/2024)  Transportation Needs: No Transportation Needs (06/05/2024)  Utilities: Not At Risk (06/05/2024)  Tobacco Use: Low Risk (06/03/2024)    Readmission Risk Interventions    06/07/2024    2:57 PM 06/04/2024   11:54 AM 06/03/2024    9:57 AM  Readmission Risk Prevention Plan  Transportation Screening Complete Complete Complete  HRI or Home Care Consult Complete Complete Complete  Social Work Consult for Recovery Care Planning/Counseling Complete Complete Complete  Palliative Care Screening Not Applicable Not Applicable Not Applicable  Medication Review Oceanographer) Complete Complete Complete

## 2024-06-08 NOTE — Progress Notes (Signed)
 " PROGRESS NOTE    Carrie Clark  FMW:[id number] DOB: 02-21-1961 DOA: 06/02/2024 PCP: Lenora Lovena Mason, FNP    Brief Narrative:   64 y.o. female with medical history significant of hypertension, hypothyroidism, COPD, CKD 3B, type 2 diabetes mellitus, tobacco dependence, history of CVA, tardive dyskinesia who presents to the emergency department from [facility] via EMS due to altered mental status.    Admitted for acute metabolic encephalopathy in the setting of possible UTI. She is also hypothermic and is on warming blankets. Current temp is 97.2. Hypotension has resolved. She is a resident at [facility] and dependent on home O2.   Evening of 1/9 into AM of 1/10, remained agitated and combative to anyone coming into her room.  Received a shot of geodon  yesterday evening, all AM psych meds, early dose of 10 mg valium ,, became groggy.  IM Geodon  discontinued, oral Geodon  dose cut in half, home Valium  moved as needed.    Currently awaiting placement.  Assessment & Plan:   Acute metabolic encephalopathy possibly secondary to UTI - reason for initial admission.  Treated with 5 days of IV antibiotics.   Schizoaffective disorder, bipolar type Agitation Initially due to agitation she received higher doses of Geodon  and Valium  causing some overt sedation.  Slowly now that been adjusted, will slowly uptitrate as deemed appropriate.  Current medications: Cymbalta  30 mg daily Zyprexa  5 mg in the morning and 10 mg at bedtime Trileptal  300 mg twice daily Cariprazine  4.5mg  daily Geodon  40 mg twice daily (PTA 80mg  at bedtime) Valium  5 mg twice daily as needed (PTA scheduled)  Pharmacy to investigate  why patient was on both Zyprexa  and Geodon  outpatient.  Should follow-up outpatient psychiatry    Hypothermia; resolved.  Patient was placed on Bair hugger initially Continue to monitor patient's temperature--> resolved.  Likely secondary to UTI above.   Hypotension -  resolved Received steroids and briefly was on Levophed .   Elevated BP - No formal documented history of the same. - Hydralazine  as needed written.  Systolics been rising as her agitation has increased in house. - better yesterday, now rising again.  Will hold on treatment and just use hydralazine  as needed as well.   Obesity class II (BMI 37.09) Diet and lifestyle modification   COPD  Continue Ventolin  as needed   Mixed hyperlipidemia Continue Lipitor   Type 2 diabetes mellitus, uncontrolled with hyperglycemia A1c on 05/16/24 was 9.7 Continue ISS and hypoglycemia protocol    Acquired hypothyroidism Continue Synthroid    CKD stage 3b Stable,baseline creatinine 1.0-1.3   DVT prophylaxis: enoxaparin    Code Status:   Code Status: Full Code Level of care: Med-Surg Status is: Inpatient Dispo: Overall stable for discharge PT Follow up Recs:   Subjective:  Some agitation last night requiring sitter and IM Zyprexa . This morning she is found in her bed.  Does not any complaints  Examination:  General exam: Appears calm and comfortable  Respiratory system: Clear to auscultation. Respiratory effort normal. Cardiovascular system: S1 & S2 heard, RRR. No JVD, murmurs, rubs, gallops or clicks. No pedal edema. Gastrointestinal system: Abdomen is nondistended, soft and nontender. No organomegaly or masses felt. Normal bowel sounds heard. Central nervous system: Alert and oriented. No focal neurological deficits. Extremities: Symmetric 5 x 5 power. Skin: No rashes, lesions or ulcers Psychiatry: Judgement and insight appear poor                Diet Orders (From admission, onward)     Start  Ordered   06/03/24 0431  Diet heart healthy/carb modified Room service appropriate? Yes; Fluid consistency: Thin  Diet effective now       Question Answer Comment  Diet-HS Snack? Nothing   Room service appropriate? Yes   Fluid consistency: Thin      06/03/24 0432             Objective: Vitals:   06/06/24 2057 06/07/24 0421 06/07/24 1310 06/07/24 1946  BP: (!) 140/62 (!) 133/51 (!) 173/65 (!) 158/50  Pulse: (!) 59 (!) 54 (!) 59 74  Resp: 18 20 18 18   Temp: (!) 97.4 F (36.3 C) 98.3 F (36.8 C) (!) 97.3 F (36.3 C) 99.6 F (37.6 C)  TempSrc: Axillary  Oral Oral  SpO2: 92% 93% 93% 96%    Intake/Output Summary (Last 24 hours) at 06/08/2024 1147 Last data filed at 06/08/2024 0018 Gross per 24 hour  Intake 100 ml  Output 500 ml  Net -400 ml   There were no vitals filed for this visit.  Scheduled Meds:  atorvastatin   40 mg Oral QHS   cariprazine   4.5 mg Oral Daily   Chlorhexidine  Gluconate Cloth  6 each Topical Q0600   donepezil   10 mg Oral QHS   DULoxetine   30 mg Oral Daily   enoxaparin  (LOVENOX ) injection  40 mg Subcutaneous Q24H   famotidine   20 mg Oral BID   furosemide   20 mg Oral Daily   insulin  aspart  0-5 Units Subcutaneous QHS   insulin  aspart  0-9 Units Subcutaneous TID WC   insulin  glargine-yfgn  5 Units Subcutaneous QHS   levothyroxine   100 mcg Oral QAC breakfast   OLANZapine   10 mg Oral QHS   [START ON 06/09/2024] OLANZapine   5 mg Oral Daily   Oxcarbazepine   300 mg Oral BID   prazosin   1 mg Oral QHS   traZODone   100 mg Oral QHS   trihexyphenidyl   2 mg Oral QHS   ziprasidone   40 mg Oral BID WC   Continuous Infusions:  cefTRIAXone  (ROCEPHIN )  IV Stopped (06/07/24 1625)    Nutritional status     There is no height or weight on file to calculate BMI.  Data Reviewed:   CBC: Recent Labs  Lab 06/02/24 2206 06/02/24 2208 06/03/24 0538 06/04/24 0815 06/05/24 0535  WBC 5.6  --  5.3 6.5 5.8  NEUTROABS 3.5  --   --   --   --   HGB 10.8*  --  10.8* 10.2* 10.7*  HCT 35.4*  --  34.5* 33.8* 34.7*  MCV 98.1  --  97.7 99.1 97.7  PLT 136* 146* 189 157 151   Basic Metabolic Panel: Recent Labs  Lab 06/02/24 2206 06/03/24 0620 06/04/24 0815 06/05/24 0535  NA 143 140 143 143  K 4.5 5.0 4.4 3.9  CL 99 100 102 102  CO2 41*  40* 31 39*  GLUCOSE 148* 186* 152* 113*  BUN 37* 34* 30* 21  CREATININE 1.45* 1.25* 1.39* 1.20*  CALCIUM  9.5 9.1 9.3 9.3  MG 2.0 1.7  --   --   PHOS  --  3.5  --   --    GFR: Estimated Creatinine Clearance: 55.3 mL/min (A) (by C-G formula based on SCr of 1.2 mg/dL (H)). Liver Function Tests: Recent Labs  Lab 06/02/24 2206 06/03/24 0620  AST 24 25  ALT 16 18  ALKPHOS 140* 141*  BILITOT 0.2 0.3  PROT 6.1* 5.8*  ALBUMIN 3.6 3.5   No results for  input(s): LIPASE, AMYLASE in the last 168 hours. Recent Labs  Lab 06/02/24 2206  AMMONIA 29   Coagulation Profile: Recent Labs  Lab 06/02/24 2208  INR 0.9   Cardiac Enzymes: No results for input(s): CKTOTAL, CKMB, CKMBINDEX, TROPONINI in the last 168 hours. BNP (last 3 results) Recent Labs    05/17/24 1101 06/02/24 2206  PROBNP 481.0* 138.0   HbA1C: No results for input(s): HGBA1C in the last 72 hours. CBG: Recent Labs  Lab 06/07/24 0726 06/07/24 1127 06/07/24 1952 06/08/24 0826 06/08/24 1139  GLUCAP 154* 194* 183* 206* 151*   Lipid Profile: No results for input(s): CHOL, HDL, LDLCALC, TRIG, CHOLHDL, LDLDIRECT in the last 72 hours. Thyroid  Function Tests: No results for input(s): TSH, T4TOTAL, FREET4, T3FREE, THYROIDAB in the last 72 hours. Anemia Panel: No results for input(s): VITAMINB12, FOLATE, FERRITIN, TIBC, IRON, RETICCTPCT in the last 72 hours. Sepsis Labs: Recent Labs  Lab 06/02/24 2206  LATICACIDVEN 1.4    Recent Results (from the past 240 hours)  Blood culture (routine x 2)     Status: None   Collection Time: 06/02/24 10:06 PM   Specimen: BLOOD  Result Value Ref Range Status   Specimen Description BLOOD RIGHT ANTECUBITAL  Final   Special Requests   Final    AEROBIC BOTTLE ONLY Blood Culture results may not be optimal due to an inadequate volume of blood received in culture bottles   Culture   Final    NO GROWTH 5 DAYS Performed at [facility], [address], [place], [place] [address]    Report Status 06/07/2024 FINAL  Final  Blood culture (routine x 2)     Status: None   Collection Time: 06/02/24 10:06 PM   Specimen: BLOOD  Result Value Ref Range Status   Specimen Description BLOOD BLOOD RIGHT FOREARM  Final   Special Requests   Final    AEROBIC BOTTLE ONLY Blood Culture results may not be optimal due to an inadequate volume of blood received in culture bottles   Culture   Final    NO GROWTH 5 DAYS Performed at [facility], [address], [place], [place] [address]    Report Status 06/07/2024 FINAL  Final         Radiology Studies: No results found.         LOS: 5 days   Time spent= 35 mins    Burgess JAYSON Dare, MD [facility]  If 7PM-7AM, please contact night-coverage  06/08/2024, 11:47 AM  "

## 2024-06-08 NOTE — Progress Notes (Addendum)
 Pt refused all scheduled medications throughout shift. Pt yelling, climbing out of bed, and trying to hit staff. PRN valium  2mg  IV administer x2 throughout shift without any changes. IM zyprexa  injection of 5mg  administered. Advised charge in need of safety sitter for upcoming shift. Vitals signs were unable to obtain on last round.

## 2024-06-08 NOTE — Plan of Care (Signed)
  Problem: Skin Integrity: Goal: Risk for impaired skin integrity will decrease Outcome: Progressing   Problem: Activity: Goal: Risk for activity intolerance will decrease Outcome: Progressing

## 2024-06-08 NOTE — Care Management Important Message (Signed)
 Important Message  Patient Details  Name: Carrie Clark MRN: [id number] Date of Birth: 1961/04/15   Important Message Given:  Yes - Medicare IM (mailed to [address], [place], [place] [address])     [name] 06/08/2024, 3:23 PM

## 2024-06-09 DIAGNOSIS — N3000 Acute cystitis without hematuria: Secondary | ICD-10-CM | POA: Diagnosis not present

## 2024-06-09 LAB — GLUCOSE, CAPILLARY
Glucose-Capillary: 142 mg/dL — ABNORMAL HIGH (ref 70–99)
Glucose-Capillary: 211 mg/dL — ABNORMAL HIGH (ref 70–99)
Glucose-Capillary: 377 mg/dL — ABNORMAL HIGH (ref 70–99)

## 2024-06-09 NOTE — Progress Notes (Signed)
 Physical Therapy Treatment Patient Details Name: Carrie Clark MRN: [id number] DOB: 06/20/1960 Today's Date: 06/09/2024   History of Present Illness Carrie Clark is a 64 y.o. female with medical history significant of hypertension, hypothyroidism, COPD, CKD 3B, type 2 diabetes mellitus, tobacco dependence, history of CVA, tardive dyskinesia who presents to the emergency department from [facility] via EMS due to altered mental status.  EMS was activated due to concern for altered mental status, patient was on few CNS acting drugs  Patient was somnolent at bedside, she was arousable, but quickly goes back to sleep and was unable to provide any history.  History was obtained from EDP and ED medical record.    PT Comments  Patient presents in chair (assisted by nursing staff) and requires encouragement for participating with therapy. Patient tolerated standing with RW and able to complete a few side steps before having to sit due to fatigue and demonstrates good return for repositioning her self when put back to bed. Patient will benefit from continued skilled physical therapy in hospital and recommended venue below to increase strength, balance, endurance for safe ADLs and gait.     If plan is discharge home, recommend the following: A lot of help with bathing/dressing/bathroom;A lot of help with walking and/or transfers;Help with stairs or ramp for entrance;Assist for transportation;Assistance with cooking/housework   Can travel by private vehicle     No  Equipment Recommendations  None recommended by PT    Recommendations for Other Services       Precautions / Restrictions Precautions Precautions: Fall Recall of Precautions/Restrictions: Impaired Restrictions Weight Bearing Restrictions Per Provider Order: No     Mobility  Bed Mobility Overal bed mobility: Needs Assistance Bed Mobility: Sit to Supine       Sit to supine: Contact guard assist   General bed mobility comments:  slightly labored movement    Transfers Overall transfer level: Needs assistance Equipment used: Rolling walker (2 wheels) Transfers: Sit to/from Stand, Bed to chair/wheelchair/BSC Sit to Stand: Min assist   Step pivot transfers: Min assist, Mod assist       General transfer comment: unsteady on feet    Ambulation/Gait Ambulation/Gait assistance: Mod assist Gait Distance (Feet): 5 Feet Assistive device: Rolling walker (2 wheels) Gait Pattern/deviations: Step-through pattern, Decreased step length - right, Decreased stride length Gait velocity: slow     General Gait Details: limited to a few side steps at beddside due to c/o fatigue   Stairs             Wheelchair Mobility     Tilt Bed    Modified Rankin (Stroke Patients Only)       Balance Overall balance assessment: Needs assistance Sitting-balance support: Feet supported, No upper extremity supported Sitting balance-Leahy Scale: Fair Sitting balance - Comments: fair/good seated at EOB   Standing balance support: Reliant on assistive device for balance, During functional activity, Bilateral upper extremity supported Standing balance-Leahy Scale: Poor Standing balance comment: fair/poor using RW                            Communication Communication Communication: No apparent difficulties  Cognition Arousal: Alert Behavior During Therapy: Agitated, Restless                             Following commands: Impaired Following commands impaired: Follows one step commands inconsistently, Follows one step commands with increased time  Cueing Cueing Techniques: Verbal cues, Tactile cues  Exercises      General Comments        Pertinent Vitals/Pain Pain Assessment Pain Assessment: No/denies pain    Home Living                          Prior Function            PT Goals (current goals can now be found in the care plan section) Acute Rehab PT Goals Patient  Stated Goal: return home PT Goal Formulation: With patient Time For Goal Achievement: 06/21/24 Potential to Achieve Goals: Good Progress towards PT goals: Progressing toward goals    Frequency    Min 3X/week      PT Plan      Co-evaluation              AM-PAC PT 6 Clicks Mobility   Outcome Measure  Help needed turning from your back to your side while in a flat bed without using bedrails?: A Little Help needed moving from lying on your back to sitting on the side of a flat bed without using bedrails?: A Little Help needed moving to and from a bed to a chair (including a wheelchair)?: A Lot Help needed standing up from a chair using your arms (e.g., wheelchair or bedside chair)?: A Lot Help needed to walk in hospital room?: A Lot Help needed climbing 3-5 steps with a railing? : A Lot 6 Click Score: 14    End of Session   Activity Tolerance: Patient tolerated treatment well;Patient limited by fatigue;Treatment limited secondary to agitation Patient left: in bed;with call bell/phone within reach;with bed alarm set Nurse Communication: Mobility status PT Visit Diagnosis: Unsteadiness on feet (R26.81);Other abnormalities of gait and mobility (R26.89);Muscle weakness (generalized) (M62.81)     Time: 8498-8475 PT Time Calculation (min) (ACUTE ONLY): 23 min  Charges:    $Therapeutic Activity: 23-37 mins PT General Charges $$ ACUTE PT VISIT: 1 Visit                     3:57 PM, 06/09/2024 Lynwood Music, MPT Physical Therapist with [facility] [phone] office [phone] mobile phone

## 2024-06-09 NOTE — Progress Notes (Signed)
 Patient becoming verbally agitated. Constantly yelling out and cussing at anyone who comes to the room. Often hear patient yelling at someone in the room. Admin PRN Valium  w/some positive results on recheck. Refused to allow tech to check blood sugar. Did accept PO meds. Will continue to monitor.

## 2024-06-09 NOTE — TOC Progression Note (Addendum)
 Transition of Care [facility]) - Progression Note    Patient Details  Name: Carrie Clark MRN: [id number] Date of Birth: [birth year]  [facility] CM/SW Contact  Hoy DELENA Bigness, LCSW Phone Number: 06/09/2024, 11:40 AM  Clinical Narrative:    Informed that [facility] will not have a bed available until Monday. CSW spoke with pt's legal guardian who is agreeable to have bed search expanded. SNF referrals have been faxed out to more facilities and currently awaiting bed offers.   ADDENDUM: Spoke with pt's legal guardian, Jama to review bed offers for SNF. Pt's LG has accepted offer for placement at [facility]. Tammy w/ [facility] states she will need to assess pt in person prior to pt coming to their facility. Tammy to assess pt in person tomorrow 1/15. ICM will follow.    Expected Discharge Plan: Skilled Nursing Facility Barriers to Discharge: Requiring sitter/restraints               Expected Discharge Plan and Services In-house Referral: Clinical Social Work Discharge Planning Services: CM Consult   Living arrangements for the past 2 months: Group Home                                       Social Drivers of Health (SDOH) Interventions SDOH Screenings   Food Insecurity: No Food Insecurity (06/05/2024)  Housing: Patient Unable To Answer (06/05/2024)  Transportation Needs: No Transportation Needs (06/05/2024)  Utilities: Not At Risk (06/05/2024)  Tobacco Use: Low Risk (06/03/2024)    Readmission Risk Interventions    06/07/2024    2:57 PM 06/04/2024   11:54 AM 06/03/2024    9:57 AM  Readmission Risk Prevention Plan  Transportation Screening Complete Complete Complete  HRI or Home Care Consult Complete Complete Complete  Social Work Consult for Recovery Care Planning/Counseling Complete Complete Complete  Palliative Care Screening Not Applicable Not Applicable Not Applicable  Medication Review Oceanographer) Complete Complete Complete

## 2024-06-09 NOTE — Plan of Care (Signed)
" °  Problem: Coping: Goal: Ability to adjust to condition or change in health will improve Outcome: Progressing   Problem: Nutritional: Goal: Maintenance of adequate nutrition will improve Outcome: Progressing   Problem: Clinical Measurements: Goal: Ability to maintain clinical measurements within normal limits will improve Outcome: Progressing Goal: Will remain free from infection Outcome: Progressing Goal: Diagnostic test results will improve Outcome: Progressing Goal: Respiratory complications will improve Outcome: Progressing   Problem: Safety: Goal: Ability to remain free from injury will improve Outcome: Progressing   "

## 2024-06-09 NOTE — Progress Notes (Signed)
 Mobility Specialist Progress Note:    06/09/24 1205  Mobility  Activity Pivoted/transferred from bed to chair  Level of Assistance Moderate assist, patient does 50-74%  Assistive Device Front wheel walker  Distance Ambulated (ft) 3 ft  Range of Motion/Exercises Active;All extremities  Activity Response Tolerated well  Mobility Referral Yes  Mobility visit 1 Mobility  Mobility Specialist Start Time (ACUTE ONLY) 1205  Mobility Specialist Stop Time (ACUTE ONLY) 1225  Mobility Specialist Time Calculation (min) (ACUTE ONLY) 20 min   Pt received in bed, agreeable to mobility. Required ModA to stand and transfer with RW. Tolerated well, confused and required verbal cues during transfer. Alarm on, call bell in reach. NT in room, all needs met.  Trenese Haft Mobility Specialist Please contact via Special Educational Needs Teacher or  Rehab office at [phone]

## 2024-06-09 NOTE — Progress Notes (Signed)
 " PROGRESS NOTE    Carrie Clark  FMW:[id number] DOB: 1960-08-24 DOA: 06/02/2024 PCP: Lenora Lovena Mason, FNP   Brief Narrative:    64 y.o. female with medical history significant of hypertension, hypothyroidism, COPD, CKD 3B, type 2 diabetes mellitus, tobacco dependence, history of CVA, tardive dyskinesia who presents to the emergency department from [facility] via EMS due to altered mental status.    Admitted for acute metabolic encephalopathy in the setting of possible UTI. She is also hypothermic and is on warming blankets. Current temp is 97.2. Hypotension has resolved. She is a resident at [facility] and dependent on home O2.   Evening of 1/9 into AM of 1/10, remained agitated and combative to anyone coming into her room.  Received a shot of geodon  yesterday evening, all AM psych meds, early dose of 10 mg valium ,, became groggy.  IM Geodon  discontinued, oral Geodon  dose cut in half, home Valium  moved as needed.     Currently awaiting placement.  Assessment & Plan:   Principal Problem:   UTI (urinary tract infection)  Assessment and Plan:   Acute metabolic encephalopathy possibly secondary to UTI - reason for initial admission.  Treated with 5 days of IV antibiotics.   Schizoaffective disorder, bipolar type Agitation Initially due to agitation she received higher doses of Geodon  and Valium  causing some overt sedation.  Slowly now that been adjusted, will slowly uptitrate as deemed appropriate.   Current medications: Cymbalta  30 mg daily Zyprexa  5 mg in the morning and 10 mg at bedtime Trileptal  300 mg twice daily Cariprazine  4.5mg  daily Geodon  40 mg twice daily (PTA 80mg  at bedtime) Valium  5 mg twice daily as needed (PTA scheduled)   Pharmacy to investigate  why patient was on both Zyprexa  and Geodon  outpatient.  Should follow-up outpatient psychiatry - Patient will need to wait another 24 hours before being discharged to facility to ensure  stability from mental standpoint.     Hypothermia; resolved.  Patient was placed on Bair hugger initially Continue to monitor patient's temperature--> resolved.  Likely secondary to UTI above.   Hypotension - resolved Received steroids and briefly was on Levophed .   Elevated BP - No formal documented history of the same. - Hydralazine  as needed written.  Systolics been rising as her agitation has increased in house. - better yesterday, now rising again.  Will hold on treatment and just use hydralazine  as needed as well.   Obesity class II (BMI 37.09) Diet and lifestyle modification   COPD  Continue Ventolin  as needed   Mixed hyperlipidemia Continue Lipitor   Type 2 diabetes mellitus, uncontrolled with hyperglycemia A1c on 05/16/24 was 9.7 Continue ISS and hypoglycemia protocol    Acquired hypothyroidism Continue Synthroid    CKD stage 3b Stable,baseline creatinine 1.0-1.3    DVT prophylaxis:Lovenox  Code Status: Full Family Communication: None at bedside Disposition Plan:  Status is: Inpatient Remains inpatient appropriate because: Need for placement and psychiatric stabilization.   Consultants:  None  Procedures:  None  Antimicrobials:  Anti-infectives (From admission, onward)    Start     Dose/Rate Route Frequency Ordered Stop   06/05/24 1600  cefTRIAXone  (ROCEPHIN ) 1 g in sodium chloride  0.9 % 100 mL IVPB        1 g 200 mL/hr over 30 Minutes Intravenous Every 24 hours 06/05/24 1224     06/03/24 2200  vancomycin  (VANCOREADY) IVPB 750 mg/150 mL  Status:  Discontinued        750 mg 150 mL/hr over  60 Minutes Intravenous Every 24 hours 06/03/24 0625 06/04/24 1315   06/03/24 1000  ceFEPIme  (MAXIPIME ) 2 g in sodium chloride  0.9 % 100 mL IVPB  Status:  Discontinued        2 g 200 mL/hr over 30 Minutes Intravenous Every 12 hours 06/03/24 0623 06/05/24 1224   06/02/24 2115  ceFEPIme  (MAXIPIME ) 2 g in sodium chloride  0.9 % 100 mL IVPB        2 g 200 mL/hr over 30  Minutes Intravenous  Once 06/02/24 2111 06/02/24 2306   06/02/24 2115  metroNIDAZOLE  (FLAGYL ) IVPB 500 mg        500 mg 100 mL/hr over 60 Minutes Intravenous  Once 06/02/24 2111 06/03/24 0016   06/02/24 2115  vancomycin  (VANCOCIN ) IVPB 1000 mg/200 mL premix        1,000 mg 200 mL/hr over 60 Minutes Intravenous  Once 06/02/24 2111 06/03/24 0112       Subjective: Patient seen and evaluated today with no new acute complaints or concerns. No acute concerns or events noted overnight.  She has been without sitter for 24 hours and appears to be stable from psychiatric standpoint.  Objective: Vitals:   06/07/24 1946 06/08/24 1431 06/08/24 1939 06/09/24 0452  BP: (!) 158/50 (!) 152/73 (!) 175/63 (!) 168/59  Pulse: 74 78 64 60  Resp: 18 18 18 18   Temp: 99.6 F (37.6 C) 97.7 F (36.5 C) 98.3 F (36.8 C) (!) 97.3 F (36.3 C)  TempSrc: Oral Axillary  Oral  SpO2: 96% 90% 92% 100%    Intake/Output Summary (Last 24 hours) at 06/09/2024 1215 Last data filed at 06/09/2024 1156 Gross per 24 hour  Intake --  Output 550 ml  Net -550 ml   There were no vitals filed for this visit.  Examination:  General exam: Appears calm and comfortable  Respiratory system: Clear to auscultation. Respiratory effort normal. Cardiovascular system: S1 & S2 heard, RRR.  Gastrointestinal system: Abdomen is soft Central nervous system: Alert and awake Extremities: No edema Skin: No significant lesions noted Psychiatry: Flat affect.    Data Reviewed: I have personally reviewed following labs and imaging studies  CBC: Recent Labs  Lab 06/02/24 2206 06/02/24 2208 06/03/24 0538 06/04/24 0815 06/05/24 0535  WBC 5.6  --  5.3 6.5 5.8  NEUTROABS 3.5  --   --   --   --   HGB 10.8*  --  10.8* 10.2* 10.7*  HCT 35.4*  --  34.5* 33.8* 34.7*  MCV 98.1  --  97.7 99.1 97.7  PLT 136* 146* 189 157 151   Basic Metabolic Panel: Recent Labs  Lab 06/02/24 2206 06/03/24 0620 06/04/24 0815 06/05/24 0535  NA 143  140 143 143  K 4.5 5.0 4.4 3.9  CL 99 100 102 102  CO2 41* 40* 31 39*  GLUCOSE 148* 186* 152* 113*  BUN 37* 34* 30* 21  CREATININE 1.45* 1.25* 1.39* 1.20*  CALCIUM  9.5 9.1 9.3 9.3  MG 2.0 1.7  --   --   PHOS  --  3.5  --   --    GFR: Estimated Creatinine Clearance: 55.3 mL/min (A) (by C-G formula based on SCr of 1.2 mg/dL (H)). Liver Function Tests: Recent Labs  Lab 06/02/24 2206 06/03/24 0620  AST 24 25  ALT 16 18  ALKPHOS 140* 141*  BILITOT 0.2 0.3  PROT 6.1* 5.8*  ALBUMIN 3.6 3.5   No results for input(s): LIPASE, AMYLASE in the last 168 hours. Recent Labs  Lab 06/02/24 2206  AMMONIA 29   Coagulation Profile: Recent Labs  Lab 06/02/24 2208  INR 0.9   Cardiac Enzymes: No results for input(s): CKTOTAL, CKMB, CKMBINDEX, TROPONINI in the last 168 hours. BNP (last 3 results) Recent Labs    05/17/24 1101 06/02/24 2206  PROBNP 481.0* 138.0   HbA1C: No results for input(s): HGBA1C in the last 72 hours. CBG: Recent Labs  Lab 06/08/24 1139 06/08/24 1620 06/08/24 2124 06/09/24 0812 06/09/24 1119  GLUCAP 151* 115* 128* 142* 211*   Lipid Profile: No results for input(s): CHOL, HDL, LDLCALC, TRIG, CHOLHDL, LDLDIRECT in the last 72 hours. Thyroid  Function Tests: No results for input(s): TSH, T4TOTAL, FREET4, T3FREE, THYROIDAB in the last 72 hours. Anemia Panel: No results for input(s): VITAMINB12, FOLATE, FERRITIN, TIBC, IRON, RETICCTPCT in the last 72 hours. Sepsis Labs: Recent Labs  Lab 06/02/24 2206  LATICACIDVEN 1.4    Recent Results (from the past 240 hours)  Blood culture (routine x 2)     Status: None   Collection Time: 06/02/24 10:06 PM   Specimen: BLOOD  Result Value Ref Range Status   Specimen Description BLOOD RIGHT ANTECUBITAL  Final   Special Requests   Final    AEROBIC BOTTLE ONLY Blood Culture results may not be optimal due to an inadequate volume of blood received in culture bottles    Culture   Final    NO GROWTH 5 DAYS Performed at [facility], [address], [place], [place] [address]    Report Status 06/07/2024 FINAL  Final  Blood culture (routine x 2)     Status: None   Collection Time: 06/02/24 10:06 PM   Specimen: BLOOD  Result Value Ref Range Status   Specimen Description BLOOD BLOOD RIGHT FOREARM  Final   Special Requests   Final    AEROBIC BOTTLE ONLY Blood Culture results may not be optimal due to an inadequate volume of blood received in culture bottles   Culture   Final    NO GROWTH 5 DAYS Performed at [facility], [address] [place], [place] [address]    Report Status 06/07/2024 FINAL  Final         Radiology Studies: No results found.      Scheduled Meds:  atorvastatin   40 mg Oral QHS   cariprazine   4.5 mg Oral Daily   donepezil   10 mg Oral QHS   DULoxetine   30 mg Oral Daily   enoxaparin  (LOVENOX ) injection  40 mg Subcutaneous Q24H   famotidine   20 mg Oral BID   furosemide   20 mg Oral Daily   insulin  aspart  0-5 Units Subcutaneous QHS   insulin  aspart  0-9 Units Subcutaneous TID WC   insulin  glargine-yfgn  5 Units Subcutaneous QHS   levothyroxine   100 mcg Oral QAC breakfast   OLANZapine   10 mg Oral QHS   OLANZapine   5 mg Oral Daily   Oxcarbazepine   300 mg Oral BID   prazosin   1 mg Oral QHS   traZODone   100 mg Oral QHS   trihexyphenidyl   2 mg Oral QHS   ziprasidone   40 mg Oral BID WC   Continuous Infusions:  cefTRIAXone  (ROCEPHIN )  IV 1 g (06/08/24 1709)     LOS: 6 days    Time spent: 55 minutes    Marsel Gail D Maree, DO [facility]  If 7PM-7AM, please contact night-coverage [email or web address] 06/09/2024, 12:15 PM   "

## 2024-06-10 DIAGNOSIS — N3000 Acute cystitis without hematuria: Secondary | ICD-10-CM | POA: Diagnosis not present

## 2024-06-10 LAB — CREATININE, SERUM
Creatinine, Ser: 1.25 mg/dL — ABNORMAL HIGH (ref 0.44–1.00)
GFR, Estimated: 48 mL/min — ABNORMAL LOW

## 2024-06-10 LAB — GLUCOSE, CAPILLARY
Glucose-Capillary: 237 mg/dL — ABNORMAL HIGH (ref 70–99)
Glucose-Capillary: 302 mg/dL — ABNORMAL HIGH (ref 70–99)
Glucose-Capillary: 53 mg/dL — ABNORMAL LOW (ref 70–99)
Glucose-Capillary: 137 mg/dL — ABNORMAL HIGH (ref 70–99)
Glucose-Capillary: 242 mg/dL — ABNORMAL HIGH (ref 70–99)
Glucose-Capillary: 226 mg/dL — ABNORMAL HIGH (ref 70–99)

## 2024-06-10 MED ORDER — DIAZEPAM 5 MG PO TABS: 5.0000 mg | ORAL_TABLET | Freq: Two times a day (BID) | ORAL | 0 refills | Status: AC | PRN

## 2024-06-10 MED ORDER — OLANZAPINE 10 MG PO TABS: 10.0000 mg | ORAL_TABLET | Freq: Every day | ORAL | 0 refills | Status: AC

## 2024-06-10 MED ORDER — OLANZAPINE 5 MG PO TABS: 5.0000 mg | ORAL_TABLET | Freq: Every day | ORAL | 0 refills | Status: DC

## 2024-06-10 MED ORDER — ZIPRASIDONE HCL 40 MG PO CAPS: 40.0000 mg | ORAL_CAPSULE | Freq: Two times a day (BID) | ORAL | 0 refills | Status: DC

## 2024-06-10 MED ORDER — OLANZAPINE 10 MG PO TABS: 10.0000 mg | ORAL_TABLET | Freq: Every day | ORAL | 0 refills | Status: DC

## 2024-06-10 MED ORDER — OLANZAPINE 5 MG PO TABS: 5.0000 mg | ORAL_TABLET | Freq: Every day | ORAL | 0 refills | Status: AC

## 2024-06-10 NOTE — Plan of Care (Signed)
" °  Problem: Coping: Goal: Ability to adjust to condition or change in health will improve Outcome: Progressing   Problem: Fluid Volume: Goal: Ability to maintain a balanced intake and output will improve Outcome: Progressing   Problem: Metabolic: Goal: Ability to maintain appropriate glucose levels will improve Outcome: Progressing   Problem: Nutritional: Goal: Maintenance of adequate nutrition will improve Outcome: Progressing   Problem: Skin Integrity: Goal: Risk for impaired skin integrity will decrease Outcome: Progressing   Problem: Tissue Perfusion: Goal: Adequacy of tissue perfusion will improve Outcome: Progressing   Problem: Clinical Measurements: Goal: Ability to maintain clinical measurements within normal limits will improve Outcome: Progressing Goal: Will remain free from infection Outcome: Progressing Goal: Diagnostic test results will improve Outcome: Progressing Goal: Respiratory complications will improve Outcome: Progressing   Problem: Activity: Goal: Risk for activity intolerance will decrease Outcome: Progressing   Problem: Coping: Goal: Level of anxiety will decrease Outcome: Progressing   Problem: Elimination: Goal: Will not experience complications related to bowel motility Outcome: Progressing Goal: Will not experience complications related to urinary retention Outcome: Progressing   Problem: Safety: Goal: Ability to remain free from injury will improve Outcome: Progressing   Problem: Skin Integrity: Goal: Risk for impaired skin integrity will decrease Outcome: Progressing   "

## 2024-06-10 NOTE — TOC Progression Note (Addendum)
 Transition of Care [facility]) - Progression Note    Patient Details  Name: Carrie Clark MRN: [id number] Date of Birth: [birth year]  [facility] CM/SW Contact  Hoy DELENA Bigness, LCSW Phone Number: 06/10/2024, 12:42 PM  Clinical Narrative:    Madelin with [facility] assessed pt in person and spoke with pt's group home director. Tammy confirmed pt is able to admit to [facility] for STR however, they will not have a bed available until tomorrow 1/16. MD notified. Avoidable days added.   ADDENDUM: CSW discovered that pt's current PASRR is for group home level only. New PASRR has been requested for SNF and currently pending assignment.    Expected Discharge Plan: Skilled Nursing Facility Barriers to Discharge: Requiring sitter/restraints               Expected Discharge Plan and Services In-house Referral: Clinical Social Work Discharge Planning Services: CM Consult   Living arrangements for the past 2 months: Group Home Expected Discharge Date: 06/10/24                                     Social Drivers of Health (SDOH) Interventions SDOH Screenings   Food Insecurity: No Food Insecurity (06/05/2024)  Housing: Patient Unable To Answer (06/05/2024)  Transportation Needs: No Transportation Needs (06/05/2024)  Utilities: Not At Risk (06/05/2024)  Tobacco Use: Low Risk (06/03/2024)    Readmission Risk Interventions    06/07/2024    2:57 PM 06/04/2024   11:54 AM 06/03/2024    9:57 AM  Readmission Risk Prevention Plan  Transportation Screening Complete Complete Complete  HRI or Home Care Consult Complete Complete Complete  Social Work Consult for Recovery Care Planning/Counseling Complete Complete Complete  Palliative Care Screening Not Applicable Not Applicable Not Applicable  Medication Review Oceanographer) Complete Complete Complete

## 2024-06-10 NOTE — Care Management Important Message (Signed)
 Important Message  Patient Details  Name: Carrie Clark MRN: [id number] Date of Birth: 19-Jun-1960   Important Message Given:  Yes - Medicare IM     Idelle Reimann L Rachel Samples 06/10/2024, 10:29 AM

## 2024-06-10 NOTE — Inpatient Diabetes Management (Signed)
 Inpatient Diabetes Program Recommendations  AACE/ADA: New Consensus Statement on Inpatient Glycemic Control   Target Ranges:  Prepandial:   less than 140 mg/dL      Peak postprandial:   less than 180 mg/dL (1-2 hours)      Critically ill patients:  140 - 180 mg/dL    Latest Reference Range & Units 06/09/24 08:12 06/09/24 11:19 06/09/24 20:51  Glucose-Capillary 70 - 99 mg/dL 857 (H) 788 (H) 622 (H)    Latest Reference Range & Units 06/08/24 08:26 06/08/24 11:39 06/08/24 16:20 06/08/24 21:24  Glucose-Capillary 70 - 99 mg/dL 793 (H) 848 (H) 884 (H) 128 (H)   Review of Glycemic Control  Diabetes history: DM2 Outpatient Diabetes medications: Lantus  15 units daily, Novolog  2-10 units TID with meals Current orders for Inpatient glycemic control: Semglee  5 units at bedtime, Novolog  0-9 units TID with meals, Novolog  0-5 units QHS  Inpatient Diabetes Program Recommendations:    Insulin : Per chart, patient refused Semglee  and Novolog  bedtime correction last night.  Please consider changing frequency of Semglee  5 units to daily (to start this morning).  Thanks, Earnie Gainer, RN, MSN, CDCES Diabetes Coordinator Inpatient Diabetes Program [phone] (Team Pager from 8am to 5pm)

## 2024-06-10 NOTE — Discharge Summary (Signed)
 Physician Discharge Summary  Carrie Clark FMW:[id number] DOB: 1960/08/23 DOA: 06/02/2024  PCP: Lenora Lovena Mason, FNP  Admit date: 06/02/2024  Discharge date: 06/10/2024  Admitted From: Group home  Disposition:  SNF  Recommendations for Outpatient Follow-up:  Follow up with PCP in 1-2 weeks See medication adjustments as noted below for schizoaffective disorder, bipolar type UTI treatment completed  Home Health: None  Equipment/Devices: None  Discharge Condition:Stable  CODE STATUS: Full  Diet recommendation: Heart Healthy/carb modified  Brief/Interim Summary: 64 y.o. female with medical history significant of hypertension, hypothyroidism, COPD, CKD 3B, type 2 diabetes mellitus, tobacco dependence, history of CVA, tardive dyskinesia who presents to the emergency department from [facility] via EMS due to altered mental status.    Admitted for acute metabolic encephalopathy in the setting of possible UTI. She is also hypothermic and is on warming blankets. Current temp is 97.2. Hypotension has resolved. She is a resident at [facility] and dependent on home O2.   Evening of 1/9 into AM of 1/10, remained agitated and combative to anyone coming into her room.  Received a shot of geodon  yesterday evening, all AM psych meds, early dose of 10 mg valium ,, became groggy.  IM Geodon  discontinued, oral Geodon  dose cut in half, home Valium  moved as needed.  She did require sitter at bedside, but has now remained without sitter for over 48 hours after adjustments to her medications she has no longer had any significant agitation.  She was waiting on placement and is now medically stable for discharge.  She has completed her course of IV antibiotics for UTI over the course of 5 days.  No other acute events or concerns noted.  Discharge Diagnoses:  Principal Problem:   UTI (urinary tract infection)  Principal discharge diagnosis: Acute metabolic encephalopathy likely  secondary to UTI.  Discharge Instructions  Discharge Instructions     Increase activity slowly   Complete by: As directed       Allergies as of 06/10/2024       Reactions   Fish Allergy Other (See Comments)   Allergy reaction not listed- on MAR        Medication List     STOP taking these medications    cefdinir  300 MG capsule Commonly known as: OMNICEF    doxycycline  100 MG tablet Commonly known as: VIBRA -TABS   potassium chloride  SA 20 MEQ tablet Commonly known as: KLOR-CON  M       TAKE these medications    trihexyphenidyl  2 MG tablet Commonly known as: ARTANE  Take 1 tablet (2 mg total) by mouth at bedtime. The timing of this medication is very important.   acetaminophen  325 MG tablet Commonly known as: TYLENOL  Take 650 mg by mouth every 6 (six) hours as needed for moderate pain (pain score 4-6).   albuterol  108 (90 Base) MCG/ACT inhaler Commonly known as: VENTOLIN  HFA Inhale 1-2 puffs into the lungs every 6 (six) hours as needed for wheezing or shortness of breath.   atorvastatin  40 MG tablet Commonly known as: LIPITOR Take 40 mg by mouth at bedtime.   diazepam  5 MG tablet Commonly known as: VALIUM  Take 1 tablet (5 mg total) by mouth every 12 (twelve) hours as needed for anxiety (agitation). What changed:  medication strength how much to take when to take this reasons to take this   diclofenac  Sodium 1 % Gel Commonly known as: VOLTAREN  Apply 2 g topically 4 (four) times daily as needed (pain).   donepezil  10 MG  tablet Commonly known as: ARICEPT  Take 10 mg by mouth at bedtime.   DULoxetine  30 MG capsule Commonly known as: Cymbalta  Take 1 capsule (30 mg total) by mouth daily.   famotidine  20 MG tablet Commonly known as: PEPCID  Take 20 mg by mouth 2 (two) times daily.   furosemide  20 MG tablet Commonly known as: LASIX  Take 1 tablet (20 mg total) by mouth daily.   glycopyrrolate  1 MG tablet Commonly known as: ROBINUL  Take 1 mg by  mouth 2 (two) times daily.   insulin  aspart 100 UNIT/ML injection Commonly known as: novoLOG  Inject 2-10 Units into the skin 3 (three) times daily before meals. Sliding scale: check sugar before meals <200=0; 200-250=2 units;251-300=4 units;301-350=6 units;351-400= 8 units;>400=10 units and call MD   insulin  glargine 100 UNIT/ML injection Commonly known as: LANTUS  Inject 15 Units into the skin daily. Hold for BS<160   levocetirizine 5 MG tablet Commonly known as: XYZAL  Take 5 mg by mouth daily.   levothyroxine  100 MCG tablet Commonly known as: SYNTHROID  Take 100 mcg by mouth daily before breakfast.   OLANZapine  10 MG tablet Commonly known as: ZYPREXA  Take 1 tablet (10 mg total) by mouth at bedtime. What changed:  medication strength how much to take when to take this Another medication with the same name was removed. Continue taking this medication, and follow the directions you see here.   OLANZapine  5 MG tablet Commonly known as: ZYPREXA  Take 1 tablet (5 mg total) by mouth daily. Start taking on: June 11, 2024 What changed:  medication strength when to take this Another medication with the same name was removed. Continue taking this medication, and follow the directions you see here.   Oxcarbazepine  300 MG tablet Commonly known as: TRILEPTAL  Take 1 tablet (300 mg total) by mouth 2 (two) times daily.   prazosin  1 MG capsule Commonly known as: MINIPRESS  Take 1 mg by mouth at bedtime.   Spiriva  HandiHaler 18 MCG Caps Generic drug: Tiotropium Bromide  Irrigate with 1 capsule as directed daily.   traZODone  100 MG tablet Commonly known as: DESYREL  Take 100 mg by mouth at bedtime.   Vraylar  4.5 MG Caps Generic drug: Cariprazine  HCl Take 1 capsule by mouth daily.   ziprasidone  40 MG capsule Commonly known as: GEODON  Take 1 capsule (40 mg total) by mouth 2 (two) times daily with a meal. What changed:  medication strength how much to take when to take this    zolpidem  5 MG tablet Commonly known as: AMBIEN  Take 5 mg by mouth at bedtime.        Contact information for follow-up providers     Lenora Lovena Mason, FNP Follow up in 1 week(s).   Specialty: Family Medicine Contact information: [address] [place] [address] [phone]              Contact information for after-discharge care     Destination     [facility]   Service: Skilled Nursing Contact information: [address] [place]  [address] [phone]                    Allergies[1]  Consultations: None   Procedures/Studies: CT Angio Chest Pulmonary Embolism (PE) W or WO Contrast Result Date: 06/03/2024 EXAM: CTA of the Chest with contrast for PE 06/03/2024 02:26:03 AM TECHNIQUE: CTA of the chest was performed without and with the administration of 75 mL of iohexol  (OMNIPAQUE ) 350 MG/ML injection. Multiplanar reformatted images are provided for review.  MIP images are provided for review. Automated exposure control, iterative reconstruction, and/or weight based adjustment of the mA/kV was utilized to reduce the radiation dose to as low as reasonably achievable. COMPARISON: 06/02/2024 CLINICAL HISTORY: Pulmonary embolism (PE) suspected, low to intermediate prob, positive D-dimer. FINDINGS: PULMONARY ARTERIES: Pulmonary arteries are adequately opacified for evaluation. No pulmonary embolism. Main pulmonary artery is normal in caliber. MEDIASTINUM: The heart and pericardium demonstrate no acute abnormality. There is no acute abnormality of the thoracic aorta. LYMPH NODES: No mediastinal, hilar or axillary lymphadenopathy. LUNGS AND PLEURA: Areas of linear airspace densities throughout the lungs bilaterally with scattered mosaic attenuation. Findings are similar to earlier study. No pleural effusion or pneumothorax. UPPER ABDOMEN: Limited images of the upper abdomen are unremarkable. SOFT TISSUES AND BONES: No acute bone  or soft tissue abnormality. IMPRESSION: 1. No pulmonary embolism. 2. Bilateral linear airspace densities with scattered mosaic attenuation, similar to prior. Electronically signed by: Franky Crease MD MD 06/03/2024 02:42 AM EST RP Workstation: [id number]   CT CHEST ABDOMEN PELVIS WO CONTRAST Result Date: 06/02/2024 CLINICAL DATA:  Sepsis EXAM: CT CHEST, ABDOMEN AND PELVIS WITHOUT CONTRAST TECHNIQUE: Multidetector CT imaging of the chest, abdomen and pelvis was performed following the standard protocol without IV contrast. RADIATION DOSE REDUCTION: This exam was performed according to the departmental dose-optimization program which includes automated exposure control, adjustment of the mA and/or kV according to patient size and/or use of iterative reconstruction technique. COMPARISON:  Chest x-ray 06/02/2024, CT 01/15/2024 FINDINGS: CT CHEST FINDINGS Cardiovascular: Limited without intravenous contrast. Mild aortic atherosclerosis. No aneurysm. Mild cardiomegaly. No pericardial effusion Mediastinum/Nodes: Patent trachea. No thyroid  mass. No suspicious lymph nodes. Esophagus within normal limits. Lungs/Pleura: No pleural effusion or pneumothorax. Mild mosaic density within the bilateral lungs. Bandlike densities at the right middle lobe, lingula and lower lobes, favored to represent atelectasis. No consolidative airspace disease. Musculoskeletal: Sternum appears intact. No acute osseous abnormality CT ABDOMEN PELVIS FINDINGS Hepatobiliary: No focal liver abnormality is seen. No gallstones, gallbladder wall thickening, or biliary dilatation. Pancreas: Unremarkable. No pancreatic ductal dilatation or surrounding inflammatory changes. Spleen: Normal in size without focal abnormality. Adrenals/Urinary Tract: Adrenal glands are stable in appearance with left 2 cm nodule consistent with adenoma. Small nonobstructing left kidney stone. Distended urinary bladder with moderate air. Lobulated bladder contour with bladder  diverticula. Stomach/Bowel: Stomach within normal limits. No dilated small bowel. No acute bowel wall thickening. Vascular/Lymphatic: Aortic atherosclerosis. No enlarged abdominal or pelvic lymph nodes. Reproductive: Uterus and bilateral adnexa are unremarkable. Other: No ascites or free air Musculoskeletal: No acute or suspicious osseous abnormality IMPRESSION: 1. Mild mosaic density within the bilateral lungs, nonspecific, but can be seen with small airways disease. Bandlike densities at the right middle lobe, lingula and lower lobes, favored to represent atelectasis. 2. Distended urinary bladder with moderate air. Correlate for recent instrumentation, if no history of this, consider gas-forming UTI. Lobulated bladder contour with bladder diverticula. 3. Small nonobstructing left kidney stone. 4. Aortic atherosclerosis. Aortic Atherosclerosis (ICD10-I70.0). Electronically Signed   By: Luke Bun M.D.   On: 06/02/2024 23:18   CT HEAD WO CONTRAST Result Date: 06/02/2024 CLINICAL DATA:  Altered mental status. EXAM: CT HEAD WITHOUT CONTRAST TECHNIQUE: Contiguous axial images were obtained from the base of the skull through the vertex without intravenous contrast. RADIATION DOSE REDUCTION: This exam was performed according to the departmental dose-optimization program which includes automated exposure control, adjustment of the mA and/or kV according to patient size and/or use of iterative reconstruction technique. COMPARISON:  November 19, 2022 FINDINGS: Brain: No evidence of acute infarction, hemorrhage, hydrocephalus, extra-axial collection or mass lesion/mass effect. Vascular: No hyperdense vessel or unexpected calcification. Skull: Normal. Negative for fracture or focal lesion. Sinuses/Orbits: No acute finding. Other: None. IMPRESSION: No acute intracranial pathology. Electronically Signed   By: Suzen Dials M.D.   On: 06/02/2024 21:15   DG Chest Port 1 View Result Date: 06/02/2024 EXAM: 1 VIEW(S) XRAY OF  THE CHEST 06/02/2024 08:13:00 PM COMPARISON: 05/30/2024 CLINICAL HISTORY: Altered altered. FINDINGS: LUNGS AND PLEURA: Vascular congestion and interstitial prominence, which could reflect interstitial edema. No focal pulmonary opacity. No pleural effusion. No pneumothorax. HEART AND MEDIASTINUM: Cardiomegaly. BONES AND SOFT TISSUES: No acute osseous abnormality. IMPRESSION: 1. Cardiomegaly. 2. Vascular congestion and interstitial prominence, which could reflect interstitial edema. Electronically signed by: Franky Crease MD MD 06/02/2024 08:21 PM EST RP Workstation: [id number]   DG Chest Port 1 View Result Date: 05/30/2024 EXAM: 1 VIEW(S) XRAY OF THE CHEST 05/30/2024 12:28:00 PM COMPARISON: 05/17/2024 CLINICAL HISTORY: recent pna - weak FINDINGS: LUNGS AND PLEURA: Decreased conspicuity of left retrocardiac opacities. No pleural effusion. No pneumothorax. HEART AND MEDIASTINUM: No acute abnormality of the cardiac and mediastinal silhouettes. Aortic calcification. BONES AND SOFT TISSUES: No acute osseous abnormality. IMPRESSION: 1. Decreased conspicuity of left retrocardiac opacities, compared to 05/17/2024. 2. Aortic atherosclerosis. Electronically signed by: Evalene Coho MD 05/30/2024 12:32 PM EST RP Workstation: [id number]   US  Venous Img Lower Unilateral Left (DVT) Result Date: 05/17/2024 CLINICAL DATA:  edema.  SHORTNESS OF BREATH. EXAM: LEFT LOWER EXTREMITY VENOUS DOPPLER ULTRASOUND TECHNIQUE: Gray-scale sonography with compression, as well as color and duplex ultrasound, were performed to evaluate the deep venous system(s) from the level of the common femoral vein through the popliteal and proximal calf veins. COMPARISON:  CHEST XR, CONCURRENT. FINDINGS: VENOUS Normal compressibility of the common femoral, superficial femoral, and popliteal veins, as well as the visualized calf veins. Visualized portions of profunda femoral vein and great saphenous vein unremarkable. No filling defects to suggest DVT on  grayscale or color Doppler imaging. Doppler waveforms show normal direction of venous flow, normal respiratory plasticity and response to augmentation. Limited views of the contralateral common femoral vein are unremarkable. OTHER No evidence of superficial thrombophlebitis or abnormal fluid collection. Limitations: none IMPRESSION: No evidence of femoropopliteal DVT or superficial thrombophlebitis within the LEFT lower extremity. Electronically Signed   By: Thom Hall M.D.   On: 05/17/2024 12:58   DG Chest Port 1 View Result Date: 05/17/2024 CLINICAL DATA:  Shortness of breath. EXAM: PORTABLE CHEST 1 VIEW COMPARISON:  01/15/2024 FINDINGS: The heart size and mediastinal contours are within normal limits. Increased patchy opacities in left retrocardiac lung base, suspicious for infiltrate. No pleural effusion. IMPRESSION: Possible new left retrocardiac infiltrate. Continued chest radiographic follow-up recommended. Electronically Signed   By: Norleen DELENA Kil M.D.   On: 05/17/2024 11:43     Discharge Exam: Vitals:   06/09/24 1453 06/10/24 0500  BP: (!) 180/149 (!) 140/62  Pulse: 69   Resp: 20 20  Temp: (!) 97.4 F (36.3 C) 97.6 F (36.4 C)  SpO2: 92%    Vitals:   06/08/24 1939 06/09/24 0452 06/09/24 1453 06/10/24 0500  BP: (!) 175/63 (!) 168/59 (!) 180/149 (!) 140/62  Pulse: 64 60 69   Resp: 18 18 20 20   Temp:  (!) 97.3 F (36.3 C) (!) 97.4 F (36.3 C) 97.6 F (36.4 C)  TempSrc:  Oral Oral Oral  SpO2: 92% 100% 92%     General: Pt  is alert, awake, not in acute distress Cardiovascular: RRR, S1/S2 +, no rubs, no gallops Respiratory: CTA bilaterally, no wheezing, no rhonchi Abdominal: Soft, NT, ND, bowel sounds + Extremities: no edema, no cyanosis    The results of significant diagnostics from this hospitalization (including imaging, microbiology, ancillary and laboratory) are listed below for reference.     Microbiology: Recent Results (from the past 240 hours)  Blood culture  (routine x 2)     Status: None   Collection Time: 06/02/24 10:06 PM   Specimen: BLOOD  Result Value Ref Range Status   Specimen Description BLOOD RIGHT ANTECUBITAL  Final   Special Requests   Final    AEROBIC BOTTLE ONLY Blood Culture results may not be optimal due to an inadequate volume of blood received in culture bottles   Culture   Final    NO GROWTH 5 DAYS Performed at [facility], [address], [place], [place] [address]    Report Status 06/07/2024 FINAL  Final  Blood culture (routine x 2)     Status: None   Collection Time: 06/02/24 10:06 PM   Specimen: BLOOD  Result Value Ref Range Status   Specimen Description BLOOD BLOOD RIGHT FOREARM  Final   Special Requests   Final    AEROBIC BOTTLE ONLY Blood Culture results may not be optimal due to an inadequate volume of blood received in culture bottles   Culture   Final    NO GROWTH 5 DAYS Performed at [facility], [address], [place], [place] [address]    Report Status 06/07/2024 FINAL  Final     Labs: BNP (last 3 results) Recent Labs    10/09/23 1011  BNP 20.0   Basic Metabolic Panel: Recent Labs  Lab 06/04/24 0815 06/05/24 0535 06/10/24 0501  NA 143 143  --   K 4.4 3.9  --   CL 102 102  --   CO2 31 39*  --   GLUCOSE 152* 113*  --   BUN 30* 21  --   CREATININE 1.39* 1.20* 1.25*  CALCIUM  9.3 9.3  --    Liver Function Tests: No results for input(s): AST, ALT, ALKPHOS, BILITOT, PROT, ALBUMIN in the last 168 hours. No results for input(s): LIPASE, AMYLASE in the last 168 hours. No results for input(s): AMMONIA in the last 168 hours. CBC: Recent Labs  Lab 06/04/24 0815 06/05/24 0535  WBC 6.5 5.8  HGB 10.2* 10.7*  HCT 33.8* 34.7*  MCV 99.1 97.7  PLT 157 151   Cardiac Enzymes: No results for input(s): CKTOTAL, CKMB, CKMBINDEX, TROPONINI in the last 168 hours. BNP: Invalid input(s): POCBNP CBG: Recent Labs  Lab 06/08/24 2124 06/09/24 0812 06/09/24 1119  06/09/24 2051 06/10/24 0734  GLUCAP 128* 142* 211* 377* 237*   D-Dimer No results for input(s): DDIMER in the last 72 hours. Hgb A1c No results for input(s): HGBA1C in the last 72 hours. Lipid Profile No results for input(s): CHOL, HDL, LDLCALC, TRIG, CHOLHDL, LDLDIRECT in the last 72 hours. Thyroid  function studies No results for input(s): TSH, T4TOTAL, T3FREE, THYROIDAB in the last 72 hours.  Invalid input(s): FREET3 Anemia work up No results for input(s): VITAMINB12, FOLATE, FERRITIN, TIBC, IRON, RETICCTPCT in the last 72 hours. Urinalysis    Component Value Date/Time   COLORURINE YELLOW 06/02/2024 2341   APPEARANCEUR HAZY (A) 06/02/2024 2341   LABSPEC 1.011 06/02/2024 2341   PHURINE 5.0 06/02/2024 2341   GLUCOSEU NEGATIVE 06/02/2024 2341   HGBUR MODERATE (A) 06/02/2024  2341   BILIRUBINUR NEGATIVE 06/02/2024 2341   KETONESUR NEGATIVE 06/02/2024 2341   PROTEINUR 30 (A) 06/02/2024 2341   UROBILINOGEN 0.2 12/09/2014 2020   NITRITE NEGATIVE 06/02/2024 2341   LEUKOCYTESUR MODERATE (A) 06/02/2024 2341   Sepsis Labs Recent Labs  Lab 06/04/24 0815 06/05/24 0535  WBC 6.5 5.8   Microbiology Recent Results (from the past 240 hours)  Blood culture (routine x 2)     Status: None   Collection Time: 06/02/24 10:06 PM   Specimen: BLOOD  Result Value Ref Range Status   Specimen Description BLOOD RIGHT ANTECUBITAL  Final   Special Requests   Final    AEROBIC BOTTLE ONLY Blood Culture results may not be optimal due to an inadequate volume of blood received in culture bottles   Culture   Final    NO GROWTH 5 DAYS Performed at [facility], [address] [place], [place] [address]    Report Status 06/07/2024 FINAL  Final  Blood culture (routine x 2)     Status: None   Collection Time: 06/02/24 10:06 PM   Specimen: BLOOD  Result Value Ref Range Status   Specimen Description BLOOD BLOOD RIGHT FOREARM  Final   Special Requests   Final     AEROBIC BOTTLE ONLY Blood Culture results may not be optimal due to an inadequate volume of blood received in culture bottles   Culture   Final    NO GROWTH 5 DAYS Performed at [facility], [address], [place], [place] [address]    Report Status 06/07/2024 FINAL  Final     Time coordinating discharge: 35 minutes  SIGNED:   Adron JONETTA Fairly, DO [facility] 06/10/2024, 10:20 AM  If 7PM-7AM, please contact night-coverage [email or web address]     [1]  Allergies Allergen Reactions   Fish Allergy Other (See Comments)    Allergy reaction not listed- on [facility]

## 2024-06-10 NOTE — Progress Notes (Signed)
 Pt declined assessment and meds upon multiple attempts throughout the night. Erminio Cone, NP notified. No further orders.

## 2024-06-10 NOTE — TOC PASRR Note (Signed)
 30 Day PASRR Note   Patient Details  Name: Carrie Clark Date of Birth: 12/10/1960   Transition of Care [facility]) CM/SW Contact:    Hoy DELENA Bigness, LCSW Phone Number: 06/10/2024, 1:13 PM  To Whom It May Concern:  Please be advised that this patient will require a short-term nursing home stay - anticipated 30 days or less for rehabilitation and strengthening.   The plan is for return home.

## 2024-06-10 NOTE — [place] FL2 (Signed)
 " [place]  MEDICAID FL2 LEVEL OF CARE FORM     IDENTIFICATION  Patient Name: Carrie Clark Birthdate: 05/11/1961 Sex: female Admission Date (Current Location): 06/02/2024  [place] and [place] Number:  Ray[place] [id number] N Facility and Address:  [facility],  [address] [place] [address]      Provider Number: [id number]  Attending Physician Name and Address:  Maree Adron BIRCH, DO  Relative Name and Phone Number:  Carrie Clark [facility] DSS ) - [phone]    Current Level of Care: Hospital Recommended Level of Care: Skilled Nursing Facility Prior Approval Number:    Date Approved/Denied:   PASRR Number: Pending  Discharge Plan: SNF    Current Diagnoses: Patient Active Problem List   Diagnosis Date Noted   Lobar pneumonia 05/19/2024   Acute respiratory failure with hypoxia (HCC) 05/19/2024   CAP (community acquired pneumonia) 05/17/2024   Community acquired pneumonia 05/17/2024   Pain due to onychomycosis of toenails of both feet 10/18/2020   UTI (urinary tract infection) 12/03/2018   Hypothyroidism 12/03/2018   HTN (hypertension) 12/03/2018   COPD (chronic obstructive pulmonary disease) (HCC) 12/03/2018   Diabetes (HCC) 12/03/2018   Altered mental status 12/03/2018   Schizoaffective disorder, bipolar type (HCC) 09/11/2015   Aggression    Episode of behavior change    Psychoses (HCC)     Orientation RESPIRATION BLADDER Height & Weight     Self  Normal Incontinent, External catheter Weight:   Height:     BEHAVIORAL SYMPTOMS/MOOD NEUROLOGICAL BOWEL NUTRITION STATUS      Incontinent Diet (Heart healthy)  AMBULATORY STATUS COMMUNICATION OF NEEDS Skin   Extensive Assist Verbally Normal                       Personal Care Assistance Level of Assistance  Bathing, Dressing, Feeding Bathing Assistance: Maximum assistance Feeding assistance: Independent Dressing Assistance: Maximum assistance     Functional Limitations Info  Sight,  Hearing, Speech Sight Info: Adequate Hearing Info: Adequate Speech Info: Impaired    SPECIAL CARE FACTORS FREQUENCY  OT (By licensed OT), PT (By licensed PT)     PT Frequency: 5x/wk OT Frequency: 5x/wk            Contractures Contractures Info: Not present    Additional Factors Info  Code Status, Allergies, Psychotropic Code Status Info: FULL Allergies Info: Fish Allergy Psychotropic Info: Cymbalta , Zyprexa , Trazodone , Geodon , trileptal , cariprazin         Current Medications (06/10/2024):  This is the current hospital active medication list Current Facility-Administered Medications  Medication Dose Route Frequency Provider Last Rate Last Admin   acetaminophen  (TYLENOL ) tablet 650 mg  650 mg Oral Q6H PRN Adefeso, Oladapo, DO       Or   acetaminophen  (TYLENOL ) suppository 650 mg  650 mg Rectal Q6H PRN Adefeso, Oladapo, DO       albuterol  (PROVENTIL ) (2.5 MG/3ML) 0.083% nebulizer solution 2.5 mg  2.5 mg Nebulization Q6H PRN Carolee Rosaline DASEN, RPH       atorvastatin  (LIPITOR) tablet 40 mg  40 mg Oral QHS Adefeso, Oladapo, DO   40 mg at 06/08/24 2211   cariprazine  (VRAYLAR ) capsule 4.5 mg  4.5 mg Oral Daily Amin, Ankit C, MD   4.5 mg at 06/09/24 9167   diazepam  (VALIUM ) injection 2 mg  2 mg Intravenous Once PRN Daniels, James K, NP       diazepam  (VALIUM ) tablet 5 mg  5 mg Oral Q12H PRN Elpidio Purchase  H, MD   5 mg at 06/09/24 1454   donepezil  (ARICEPT ) tablet 10 mg  10 mg Oral QHS Elpidio Reyes DEL, MD   10 mg at 06/08/24 2212   DULoxetine  (CYMBALTA ) DR capsule 30 mg  30 mg Oral Daily Adefeso, Oladapo, DO   30 mg at 06/09/24 9167   enoxaparin  (LOVENOX ) injection 40 mg  40 mg Subcutaneous Q24H Adefeso, Oladapo, DO   40 mg at 06/09/24 0831   famotidine  (PEPCID ) tablet 20 mg  20 mg Oral BID Amin, Ankit C, MD   20 mg at 06/09/24 9167   furosemide  (LASIX ) tablet 20 mg  20 mg Oral Daily Elpidio Reyes DEL, MD   20 mg at 06/09/24 9167   hydrALAZINE  (APRESOLINE ) injection 10 mg  10 mg  Intravenous Q4H PRN Elpidio Reyes DEL, MD       insulin  aspart (novoLOG ) injection 0-5 Units  0-5 Units Subcutaneous QHS Adefeso, Oladapo, DO   2 Units at 06/06/24 2137   insulin  aspart (novoLOG ) injection 0-9 Units  0-9 Units Subcutaneous TID WC Adefeso, Oladapo, DO   7 Units at 06/10/24 1143   insulin  glargine-yfgn (SEMGLEE ) injection 5 Units  5 Units Subcutaneous QHS Adefeso, Oladapo, DO   5 Units at 06/08/24 2213   levothyroxine  (SYNTHROID ) tablet 100 mcg  100 mcg Oral QAC breakfast Adefeso, Oladapo, DO   100 mcg at 06/09/24 9388   OLANZapine  (ZYPREXA ) tablet 10 mg  10 mg Oral QHS Amin, Ankit C, MD   10 mg at 06/08/24 2211   OLANZapine  (ZYPREXA ) tablet 5 mg  5 mg Oral Daily Amin, Ankit C, MD   5 mg at 06/09/24 9167   ondansetron  (ZOFRAN ) tablet 4 mg  4 mg Oral Q6H PRN Adefeso, Oladapo, DO       Or   ondansetron  (ZOFRAN ) injection 4 mg  4 mg Intravenous Q6H PRN Adefeso, Oladapo, DO       Oxcarbazepine  (TRILEPTAL ) tablet 300 mg  300 mg Oral BID Elpidio Reyes DEL, MD   300 mg at 06/09/24 9167   prazosin  (MINIPRESS ) capsule 1 mg  1 mg Oral QHS Adefeso, Oladapo, DO   1 mg at 06/08/24 2212   traZODone  (DESYREL ) tablet 100 mg  100 mg Oral QHS Elpidio Reyes DEL, MD   100 mg at 06/08/24 2210   trihexyphenidyl  (ARTANE ) tablet 2 mg  2 mg Oral QHS Elpidio Reyes DEL, MD   2 mg at 06/08/24 2210   ziprasidone  (GEODON ) capsule 40 mg  40 mg Oral BID WC Elpidio Reyes DEL, MD   40 mg at 06/09/24 1658     Discharge Medications: Please see discharge summary for a list of discharge medications.  Relevant Imaging Results:  Relevant Lab Results:   Additional Information SSN: [id number]  Hoy DELENA Bigness, LCSW     "

## 2024-06-11 DIAGNOSIS — N3000 Acute cystitis without hematuria: Secondary | ICD-10-CM | POA: Diagnosis not present

## 2024-06-11 LAB — GLUCOSE, CAPILLARY
Glucose-Capillary: 206 mg/dL — ABNORMAL HIGH (ref 70–99)
Glucose-Capillary: 220 mg/dL — ABNORMAL HIGH (ref 70–99)
Glucose-Capillary: 142 mg/dL — ABNORMAL HIGH (ref 70–99)
Glucose-Capillary: 100 mg/dL — ABNORMAL HIGH (ref 70–99)

## 2024-06-11 NOTE — Progress Notes (Signed)
 " PROGRESS NOTE    Carrie Clark  FMW:[id number] DOB: May 09, 1961 DOA: 06/02/2024 PCP: Lenora Lovena Mason, FNP   Brief Narrative:    64 y.o. female with medical history significant of hypertension, hypothyroidism, COPD, CKD 3B, type 2 diabetes mellitus, tobacco dependence, history of CVA, tardive dyskinesia who presents to the emergency department from [facility] via EMS due to altered mental status.    Admitted for acute metabolic encephalopathy in the setting of possible UTI. She is also hypothermic and is on warming blankets. Current temp is 97.2. Hypotension has resolved. She is a resident at [facility] and dependent on home O2.   Evening of 1/9 into AM of 1/10, remained agitated and combative to anyone coming into her room.  Received a shot of geodon  yesterday evening, all AM psych meds, early dose of 10 mg valium ,, became groggy.  IM Geodon  discontinued, oral Geodon  dose cut in half, home Valium  moved as needed.     Currently awaiting placement with in person evaluation on Tuesday per TOC.  Assessment & Plan:   Principal Problem:   UTI (urinary tract infection)  Assessment and Plan:   Acute metabolic encephalopathy possibly secondary to UTI-resolved - reason for initial admission.  Treated with 5 days of IV antibiotics.   Schizoaffective disorder, bipolar type Agitation Initially due to agitation she received higher doses of Geodon  and Valium  causing some overt sedation.  Slowly now that been adjusted, will slowly uptitrate as deemed appropriate.   Current medications: Cymbalta  30 mg daily Zyprexa  5 mg in the morning and 10 mg at bedtime Trileptal  300 mg twice daily Cariprazine  4.5mg  daily Geodon  40 mg twice daily (PTA 80mg  at bedtime) Valium  5 mg twice daily as needed (PTA scheduled)   Pharmacy to investigate  why patient was on both Zyprexa  and Geodon  outpatient.  Should follow-up outpatient psychiatry - Patient will need to wait another 24  hours before being discharged to facility to ensure stability from mental standpoint.     Hypothermia; resolved.  Patient was placed on Bair hugger initially Continue to monitor patient's temperature--> resolved.  Likely secondary to UTI above.   Hypotension - resolved Received steroids and briefly was on Levophed .   Elevated BP - No formal documented history of the same. - Hydralazine  as needed written.  Systolics been rising as her agitation has increased in house. - better yesterday, now rising again.  Will hold on treatment and just use hydralazine  as needed as well.   Obesity class II (BMI 37.09) Diet and lifestyle modification   COPD  Continue Ventolin  as needed   Mixed hyperlipidemia Continue Lipitor   Type 2 diabetes mellitus, uncontrolled with hyperglycemia A1c on 05/16/24 was 9.7 Continue ISS and hypoglycemia protocol    Acquired hypothyroidism Continue Synthroid    CKD stage 3b Stable,baseline creatinine 1.0-1.3    DVT prophylaxis:Lovenox  Code Status: Full Family Communication: None at bedside Disposition Plan:  Status is: Inpatient Remains inpatient appropriate because: Need for placement and psychiatric stabilization.   Consultants:  None  Procedures:  None  Antimicrobials:  Anti-infectives (From admission, onward)    Start     Dose/Rate Route Frequency Ordered Stop   06/05/24 1600  cefTRIAXone  (ROCEPHIN ) 1 g in sodium chloride  0.9 % 100 mL IVPB        1 g 200 mL/hr over 30 Minutes Intravenous Every 24 hours 06/05/24 1224 06/09/24 2115   06/03/24 2200  vancomycin  (VANCOREADY) IVPB 750 mg/150 mL  Status:  Discontinued  750 mg 150 mL/hr over 60 Minutes Intravenous Every 24 hours 06/03/24 0625 06/04/24 1315   06/03/24 1000  ceFEPIme  (MAXIPIME ) 2 g in sodium chloride  0.9 % 100 mL IVPB  Status:  Discontinued        2 g 200 mL/hr over 30 Minutes Intravenous Every 12 hours 06/03/24 0623 06/05/24 1224   06/02/24 2115  ceFEPIme  (MAXIPIME ) 2 g in  sodium chloride  0.9 % 100 mL IVPB        2 g 200 mL/hr over 30 Minutes Intravenous  Once 06/02/24 2111 06/02/24 2306   06/02/24 2115  metroNIDAZOLE  (FLAGYL ) IVPB 500 mg        500 mg 100 mL/hr over 60 Minutes Intravenous  Once 06/02/24 2111 06/03/24 0016   06/02/24 2115  vancomycin  (VANCOCIN ) IVPB 1000 mg/200 mL premix        1,000 mg 200 mL/hr over 60 Minutes Intravenous  Once 06/02/24 2111 06/03/24 0112       Subjective: Patient seen and evaluated today with no new acute complaints or concerns. No acute concerns or events noted overnight.  She has remained stable from a psychiatric standpoint with no sitter at bedside.  Currently somnolent.  Objective: Vitals:   06/10/24 0500 06/10/24 1319 06/10/24 2111 06/11/24 0500  BP: (!) 140/62 (!) 106/59 (!) 153/60 (!) 108/47  Pulse:  87 (!) 57 60  Resp: 20 20 20 20   Temp: 97.6 F (36.4 C) 98.2 F (36.8 C)  (!) 97.5 F (36.4 C)  TempSrc: Oral Oral  Axillary  SpO2:  94% 96% 90%    Intake/Output Summary (Last 24 hours) at 06/11/2024 1311 Last data filed at 06/10/2024 1700 Gross per 24 hour  Intake 150 ml  Output --  Net 150 ml   There were no vitals filed for this visit.  Examination:  General exam: Appears calm and comfortable, somnolent Respiratory system: Clear to auscultation. Respiratory effort normal. Cardiovascular system: S1 & S2 heard, RRR.  Gastrointestinal system: Abdomen is soft Central nervous system: Somnolent Extremities: No edema Skin: No significant lesions noted Psychiatry: Flat affect.    Data Reviewed: I have personally reviewed following labs and imaging studies  CBC: Recent Labs  Lab 06/05/24 0535  WBC 5.8  HGB 10.7*  HCT 34.7*  MCV 97.7  PLT 151   Basic Metabolic Panel: Recent Labs  Lab 06/05/24 0535 06/10/24 0501  NA 143  --   K 3.9  --   CL 102  --   CO2 39*  --   GLUCOSE 113*  --   BUN 21  --   CREATININE 1.20* 1.25*  CALCIUM  9.3  --    GFR: Estimated Creatinine Clearance: 53  mL/min (A) (by C-G formula based on SCr of 1.25 mg/dL (H)). Liver Function Tests: No results for input(s): AST, ALT, ALKPHOS, BILITOT, PROT, ALBUMIN in the last 168 hours.  No results for input(s): LIPASE, AMYLASE in the last 168 hours. No results for input(s): AMMONIA in the last 168 hours.  Coagulation Profile: No results for input(s): INR, PROTIME in the last 168 hours.  Cardiac Enzymes: No results for input(s): CKTOTAL, CKMB, CKMBINDEX, TROPONINI in the last 168 hours. BNP (last 3 results) Recent Labs    05/17/24 1101 06/02/24 2206  PROBNP 481.0* 138.0   HbA1C: No results for input(s): HGBA1C in the last 72 hours. CBG: Recent Labs  Lab 06/10/24 1737 06/10/24 1949 06/10/24 2248 06/11/24 0725 06/11/24 1105  GLUCAP 137* 242* 226* 206* 220*   Lipid Profile: No results  for input(s): CHOL, HDL, LDLCALC, TRIG, CHOLHDL, LDLDIRECT in the last 72 hours. Thyroid  Function Tests: No results for input(s): TSH, T4TOTAL, FREET4, T3FREE, THYROIDAB in the last 72 hours. Anemia Panel: No results for input(s): VITAMINB12, FOLATE, FERRITIN, TIBC, IRON, RETICCTPCT in the last 72 hours. Sepsis Labs: No results for input(s): PROCALCITON, LATICACIDVEN in the last 168 hours.   Recent Results (from the past 240 hours)  Blood culture (routine x 2)     Status: None   Collection Time: 06/02/24 10:06 PM   Specimen: BLOOD  Result Value Ref Range Status   Specimen Description BLOOD RIGHT ANTECUBITAL  Final   Special Requests   Final    AEROBIC BOTTLE ONLY Blood Culture results may not be optimal due to an inadequate volume of blood received in culture bottles   Culture   Final    NO GROWTH 5 DAYS Performed at [facility], [address], [place], [place] [address]    Report Status 06/07/2024 FINAL  Final  Blood culture (routine x 2)     Status: None   Collection Time: 06/02/24 10:06 PM   Specimen: BLOOD  Result Value  Ref Range Status   Specimen Description BLOOD BLOOD RIGHT FOREARM  Final   Special Requests   Final    AEROBIC BOTTLE ONLY Blood Culture results may not be optimal due to an inadequate volume of blood received in culture bottles   Culture   Final    NO GROWTH 5 DAYS Performed at [facility], [address], [place], [place] [address]    Report Status 06/07/2024 FINAL  Final         Radiology Studies: No results found.      Scheduled Meds:  atorvastatin   40 mg Oral QHS   cariprazine   4.5 mg Oral Daily   donepezil   10 mg Oral QHS   DULoxetine   30 mg Oral Daily   enoxaparin  (LOVENOX ) injection  40 mg Subcutaneous Q24H   famotidine   20 mg Oral BID   furosemide   20 mg Oral Daily   insulin  aspart  0-5 Units Subcutaneous QHS   insulin  aspart  0-9 Units Subcutaneous TID WC   insulin  glargine-yfgn  5 Units Subcutaneous QHS   levothyroxine   100 mcg Oral QAC breakfast   OLANZapine   10 mg Oral QHS   OLANZapine   5 mg Oral Daily   Oxcarbazepine   300 mg Oral BID   prazosin   1 mg Oral QHS   traZODone   100 mg Oral QHS   trihexyphenidyl   2 mg Oral QHS   ziprasidone   40 mg Oral BID WC     LOS: 8 days    Time spent: 55 minutes    Linnae Rasool JONETTA Fairly, DO [facility]  If 7PM-7AM, please contact night-coverage [email or web address] 06/11/2024, 1:11 PM   "

## 2024-06-11 NOTE — Plan of Care (Signed)
   Problem: Coping: Goal: Ability to adjust to condition or change in health will improve Outcome: Progressing   Problem: Nutritional: Goal: Maintenance of adequate nutrition will improve Outcome: Progressing   Problem: Skin Integrity: Goal: Risk for impaired skin integrity will decrease Outcome: Progressing   Problem: Activity: Goal: Risk for activity intolerance will decrease Outcome: Progressing

## 2024-06-11 NOTE — TOC Progression Note (Signed)
 Transition of Care [facility]) - Progression Note    Patient Details  Name: Carrie Clark MRN: [id number] Date of Birth: 1960/11/14  [facility] CM/SW Contact  Hoy DELENA Bigness, LCSW Phone Number: 06/11/2024, 12:37 PM  Clinical Narrative:    Pt's PASRR still pending. Spoke with Nelwyn Shoulder w/ [facility] who states an in person evaluation will need to be completed. Per Nelwyn he is unable to come today to assess pt and their office is closed on Monday due to the holiday. Nelwyn to assess pt on Tuesday around 10:30am. New authorization will need to be requested closer to DC date. Avoidable days added.   Expected Discharge Plan: Skilled Nursing Facility Barriers to Discharge: No SNF bed               Expected Discharge Plan and Services In-house Referral: Clinical Social Work Discharge Planning Services: CM Consult Post Acute Care Choice: Skilled Nursing Facility Living arrangements for the past 2 months: Group Home Expected Discharge Date: 06/10/24               DME Arranged: N/A DME Agency: NA                   Social Drivers of Health (SDOH) Interventions SDOH Screenings   Food Insecurity: No Food Insecurity (06/05/2024)  Housing: Patient Unable To Answer (06/05/2024)  Transportation Needs: No Transportation Needs (06/05/2024)  Utilities: Not At Risk (06/05/2024)  Tobacco Use: Low Risk (06/03/2024)    Readmission Risk Interventions    06/10/2024   12:47 PM 06/07/2024    2:57 PM 06/04/2024   11:54 AM  Readmission Risk Prevention Plan  Transportation Screening Complete Complete Complete  PCP or Specialist Appt within 3-5 Days Complete    HRI or Home Care Consult Complete Complete Complete  Social Work Consult for Recovery Care Planning/Counseling Complete Complete Complete  Palliative Care Screening Not Applicable Not Applicable Not Applicable  Medication Review Oceanographer) Complete Complete Complete

## 2024-06-12 DIAGNOSIS — N3000 Acute cystitis without hematuria: Secondary | ICD-10-CM | POA: Diagnosis not present

## 2024-06-12 LAB — GLUCOSE, CAPILLARY
Glucose-Capillary: 208 mg/dL — ABNORMAL HIGH (ref 70–99)
Glucose-Capillary: 152 mg/dL — ABNORMAL HIGH (ref 70–99)
Glucose-Capillary: 237 mg/dL — ABNORMAL HIGH (ref 70–99)
Glucose-Capillary: 242 mg/dL — ABNORMAL HIGH (ref 70–99)

## 2024-06-12 NOTE — Progress Notes (Signed)
 " PROGRESS NOTE    Carrie Clark  FMW:[id number] DOB: 1961-02-19 DOA: 06/02/2024 PCP: Lenora Lovena Mason, FNP   Brief Narrative:    64 y.o. female with medical history significant of hypertension, hypothyroidism, COPD, CKD 3B, type 2 diabetes mellitus, tobacco dependence, history of CVA, tardive dyskinesia who presents to the emergency department from [facility] via EMS due to altered mental status.    Admitted for acute metabolic encephalopathy in the setting of possible UTI. She is also hypothermic and is on warming blankets. Current temp is 97.2. Hypotension has resolved. She is a resident at [facility] and dependent on home O2.   Evening of 1/9 into AM of 1/10, remained agitated and combative to anyone coming into her room.  Received a shot of geodon  yesterday evening, all AM psych meds, early dose of 10 mg valium ,, became groggy.  IM Geodon  discontinued, oral Geodon  dose cut in half, home Valium  moved as needed.     Currently awaiting placement with in person evaluation on Tuesday per TOC.  Assessment & Plan:   Principal Problem:   UTI (urinary tract infection)  Assessment and Plan:   Acute metabolic encephalopathy possibly secondary to UTI-resolved - reason for initial admission.  Treated with 5 days of IV antibiotics.   Schizoaffective disorder, bipolar type Agitation Initially due to agitation she received higher doses of Geodon  and Valium  causing some overt sedation.  Slowly now that been adjusted, will slowly uptitrate as deemed appropriate.   Current medications: Cymbalta  30 mg daily Zyprexa  5 mg in the morning and 10 mg at bedtime Trileptal  300 mg twice daily Cariprazine  4.5mg  daily Geodon  40 mg twice daily (PTA 80mg  at bedtime) Valium  5 mg twice daily as needed (PTA scheduled)   Pharmacy to investigate  why patient was on both Zyprexa  and Geodon  outpatient.  Should follow-up outpatient psychiatry - Patient will need to wait another 24  hours before being discharged to facility to ensure stability from mental standpoint.     Hypothermia; resolved.  Patient was placed on Bair hugger initially Continue to monitor patient's temperature--> resolved.  Likely secondary to UTI above.   Hypotension - resolved Received steroids and briefly was on Levophed .   Elevated BP - No formal documented history of the same. - Hydralazine  as needed written.  Systolics been rising as her agitation has increased in house. - better yesterday, now rising again.  Will hold on treatment and just use hydralazine  as needed as well.   Obesity class II (BMI 37.09) Diet and lifestyle modification   COPD  Continue Ventolin  as needed   Mixed hyperlipidemia Continue Lipitor   Type 2 diabetes mellitus, uncontrolled with hyperglycemia A1c on 05/16/24 was 9.7 Continue ISS and hypoglycemia protocol    Acquired hypothyroidism Continue Synthroid    CKD stage 3b Stable,baseline creatinine 1.0-1.3    DVT prophylaxis:Lovenox  Code Status: Full Family Communication: None at bedside Disposition Plan:  Status is: Inpatient Remains inpatient appropriate because: Need for placement and psychiatric stabilization.   Consultants:  None  Procedures:  None  Antimicrobials:  Anti-infectives (From admission, onward)    Start     Dose/Rate Route Frequency Ordered Stop   06/05/24 1600  cefTRIAXone  (ROCEPHIN ) 1 g in sodium chloride  0.9 % 100 mL IVPB        1 g 200 mL/hr over 30 Minutes Intravenous Every 24 hours 06/05/24 1224 06/09/24 2115   06/03/24 2200  vancomycin  (VANCOREADY) IVPB 750 mg/150 mL  Status:  Discontinued  750 mg 150 mL/hr over 60 Minutes Intravenous Every 24 hours 06/03/24 0625 06/04/24 1315   06/03/24 1000  ceFEPIme  (MAXIPIME ) 2 g in sodium chloride  0.9 % 100 mL IVPB  Status:  Discontinued        2 g 200 mL/hr over 30 Minutes Intravenous Every 12 hours 06/03/24 0623 06/05/24 1224   06/02/24 2115  ceFEPIme  (MAXIPIME ) 2 g in  sodium chloride  0.9 % 100 mL IVPB        2 g 200 mL/hr over 30 Minutes Intravenous  Once 06/02/24 2111 06/02/24 2306   06/02/24 2115  metroNIDAZOLE  (FLAGYL ) IVPB 500 mg        500 mg 100 mL/hr over 60 Minutes Intravenous  Once 06/02/24 2111 06/03/24 0016   06/02/24 2115  vancomycin  (VANCOCIN ) IVPB 1000 mg/200 mL premix        1,000 mg 200 mL/hr over 60 Minutes Intravenous  Once 06/02/24 2111 06/03/24 0112       Subjective: Patient seen and evaluated today with no new acute complaints or concerns. No acute concerns or events noted overnight.  She has remained stable from a psychiatric standpoint.  Objective: Vitals:   06/11/24 0500 06/11/24 1439 06/11/24 2153 06/12/24 0419  BP: (!) 108/47 (!) 153/76 134/66 (!) 144/66  Pulse: 60 (!) 58 60 (!) 55  Resp: 20 18 10 14   Temp: (!) 97.5 F (36.4 C) 97.6 F (36.4 C) 97.6 F (36.4 C) (!) 97.5 F (36.4 C)  TempSrc: Axillary Oral Oral Oral  SpO2: 90% 93% 91% 90%    Intake/Output Summary (Last 24 hours) at 06/12/2024 1203 Last data filed at 06/11/2024 2000 Gross per 24 hour  Intake 200 ml  Output --  Net 200 ml   There were no vitals filed for this visit.  Examination:  General exam: Appears calm and comfortable Respiratory system: Clear to auscultation. Respiratory effort normal. Cardiovascular system: S1 & S2 heard, RRR.  Gastrointestinal system: Abdomen is soft Central nervous system: Awake and alert Extremities: No edema Skin: No significant lesions noted Psychiatry: Flat affect.    Data Reviewed: I have personally reviewed following labs and imaging studies  CBC: No results for input(s): WBC, NEUTROABS, HGB, HCT, MCV, PLT in the last 168 hours.  Basic Metabolic Panel: Recent Labs  Lab 06/10/24 0501  CREATININE 1.25*   GFR: Estimated Creatinine Clearance: 53 mL/min (A) (by C-G formula based on SCr of 1.25 mg/dL (H)). Liver Function Tests: No results for input(s): AST, ALT, ALKPHOS, BILITOT,  PROT, ALBUMIN in the last 168 hours.  No results for input(s): LIPASE, AMYLASE in the last 168 hours. No results for input(s): AMMONIA in the last 168 hours.  Coagulation Profile: No results for input(s): INR, PROTIME in the last 168 hours.  Cardiac Enzymes: No results for input(s): CKTOTAL, CKMB, CKMBINDEX, TROPONINI in the last 168 hours. BNP (last 3 results) Recent Labs    05/17/24 1101 06/02/24 2206  PROBNP 481.0* 138.0   HbA1C: No results for input(s): HGBA1C in the last 72 hours. CBG: Recent Labs  Lab 06/11/24 1105 06/11/24 1634 06/11/24 2148 06/12/24 0722 06/12/24 1113  GLUCAP 220* 142* 100* 208* 152*   Lipid Profile: No results for input(s): CHOL, HDL, LDLCALC, TRIG, CHOLHDL, LDLDIRECT in the last 72 hours. Thyroid  Function Tests: No results for input(s): TSH, T4TOTAL, FREET4, T3FREE, THYROIDAB in the last 72 hours. Anemia Panel: No results for input(s): VITAMINB12, FOLATE, FERRITIN, TIBC, IRON, RETICCTPCT in the last 72 hours. Sepsis Labs: No results for input(s): PROCALCITON, LATICACIDVEN  in the last 168 hours.   Recent Results (from the past 240 hours)  Blood culture (routine x 2)     Status: None   Collection Time: 06/02/24 10:06 PM   Specimen: BLOOD  Result Value Ref Range Status   Specimen Description BLOOD RIGHT ANTECUBITAL  Final   Special Requests   Final    AEROBIC BOTTLE ONLY Blood Culture results may not be optimal due to an inadequate volume of blood received in culture bottles   Culture   Final    NO GROWTH 5 DAYS Performed at [facility], [address], [place], [place] [address]    Report Status 06/07/2024 FINAL  Final  Blood culture (routine x 2)     Status: None   Collection Time: 06/02/24 10:06 PM   Specimen: BLOOD  Result Value Ref Range Status   Specimen Description BLOOD BLOOD RIGHT FOREARM  Final   Special Requests   Final    AEROBIC BOTTLE ONLY Blood Culture results  may not be optimal due to an inadequate volume of blood received in culture bottles   Culture   Final    NO GROWTH 5 DAYS Performed at [facility], [address] [place], [place] [address]    Report Status 06/07/2024 FINAL  Final         Radiology Studies: No results found.      Scheduled Meds:  atorvastatin   40 mg Oral QHS   cariprazine   4.5 mg Oral Daily   donepezil   10 mg Oral QHS   DULoxetine   30 mg Oral Daily   enoxaparin  (LOVENOX ) injection  40 mg Subcutaneous Q24H   famotidine   20 mg Oral BID   furosemide   20 mg Oral Daily   insulin  aspart  0-5 Units Subcutaneous QHS   insulin  aspart  0-9 Units Subcutaneous TID WC   insulin  glargine-yfgn  5 Units Subcutaneous QHS   levothyroxine   100 mcg Oral QAC breakfast   OLANZapine   10 mg Oral QHS   OLANZapine   5 mg Oral Daily   Oxcarbazepine   300 mg Oral BID   prazosin   1 mg Oral QHS   traZODone   100 mg Oral QHS   trihexyphenidyl   2 mg Oral QHS   ziprasidone   40 mg Oral BID WC     LOS: 9 days    Time spent: 55 minutes    Trust Leh JONETTA Fairly, DO [facility]  If 7PM-7AM, please contact night-coverage [email or web address] 06/12/2024, 12:03 PM   "

## 2024-06-12 NOTE — Plan of Care (Signed)
" °  Problem: Coping: Goal: Ability to adjust to condition or change in health will improve Outcome: Progressing   Problem: Metabolic: Goal: Ability to maintain appropriate glucose levels will improve Outcome: Progressing   Problem: Nutritional: Goal: Maintenance of adequate nutrition will improve Outcome: Progressing   Problem: Activity: Goal: Risk for activity intolerance will decrease Outcome: Progressing   Problem: Coping: Goal: Level of anxiety will decrease Outcome: Progressing   "

## 2024-06-13 DIAGNOSIS — N3000 Acute cystitis without hematuria: Secondary | ICD-10-CM | POA: Diagnosis not present

## 2024-06-13 LAB — GLUCOSE, CAPILLARY
Glucose-Capillary: 164 mg/dL — ABNORMAL HIGH (ref 70–99)
Glucose-Capillary: 273 mg/dL — ABNORMAL HIGH (ref 70–99)
Glucose-Capillary: 342 mg/dL — ABNORMAL HIGH (ref 70–99)

## 2024-06-13 NOTE — Progress Notes (Signed)
 " PROGRESS NOTE    Carrie Clark  FMW:[id number] DOB: [birth year] DOA: 06/02/2024 PCP: Lenora Lovena Mason, FNP   Brief Narrative:    64 y.o. female with medical history significant of hypertension, hypothyroidism, COPD, CKD 3B, type 2 diabetes mellitus, tobacco dependence, history of CVA, tardive dyskinesia who presents to the emergency department from [facility] via EMS due to altered mental status.    Admitted for acute metabolic encephalopathy in the setting of possible UTI. She is also hypothermic and is on warming blankets. Current temp is 97.2. Hypotension has resolved. She is a resident at [facility] and dependent on home O2.   Evening of 1/9 into AM of 1/10, remained agitated and combative to anyone coming into her room.  Received a shot of geodon  yesterday evening, all AM psych meds, early dose of 10 mg valium ,, became groggy.  IM Geodon  discontinued, oral Geodon  dose cut in half, home Valium  moved as needed.     Currently awaiting placement with in person evaluation on Tuesday per TOC.  Assessment & Plan:   Principal Problem:   UTI (urinary tract infection)  Assessment and Plan:   Acute metabolic encephalopathy possibly secondary to UTI-resolved - reason for initial admission.  Treated with 5 days of IV antibiotics.   Schizoaffective disorder, bipolar type Agitation Initially due to agitation she received higher doses of Geodon  and Valium  causing some overt sedation.  Slowly now that been adjusted, will slowly uptitrate as deemed appropriate.   Current medications: Cymbalta  30 mg daily Zyprexa  5 mg in the morning and 10 mg at bedtime Trileptal  300 mg twice daily Cariprazine  4.5mg  daily Geodon  40 mg twice daily (PTA 80mg  at bedtime) Valium  5 mg twice daily as needed (PTA scheduled)   Pharmacy to investigate  why patient was on both Zyprexa  and Geodon  outpatient.  Should follow-up outpatient psychiatry - Patient will need to wait another 24  hours before being discharged to facility to ensure stability from mental standpoint.     Hypothermia; resolved.  Patient was placed on Bair hugger initially Continue to monitor patient's temperature--> resolved.  Likely secondary to UTI above.   Hypotension - resolved Received steroids and briefly was on Levophed .   Elevated BP - No formal documented history of the same. - Hydralazine  as needed written.  Systolics been rising as her agitation has increased in house. - better yesterday, now rising again.  Will hold on treatment and just use hydralazine  as needed as well.   Obesity class II (BMI 37.09) Diet and lifestyle modification   COPD  Continue Ventolin  as needed   Mixed hyperlipidemia Continue Lipitor   Type 2 diabetes mellitus, uncontrolled with hyperglycemia A1c on 05/16/24 was 9.7 Continue ISS and hypoglycemia protocol    Acquired hypothyroidism Continue Synthroid    CKD stage 3b Stable,baseline creatinine 1.0-1.3    DVT prophylaxis:Lovenox  Code Status: Full Family Communication: None at bedside Disposition Plan:  Status is: Inpatient Remains inpatient appropriate because: Need for placement and psychiatric stabilization.   Consultants:  None  Procedures:  None  Antimicrobials:  Anti-infectives (From admission, onward)    Start     Dose/Rate Route Frequency Ordered Stop   06/05/24 1600  cefTRIAXone  (ROCEPHIN ) 1 g in sodium chloride  0.9 % 100 mL IVPB        1 g 200 mL/hr over 30 Minutes Intravenous Every 24 hours 06/05/24 1224 06/09/24 2115   06/03/24 2200  vancomycin  (VANCOREADY) IVPB 750 mg/150 mL  Status:  Discontinued  750 mg 150 mL/hr over 60 Minutes Intravenous Every 24 hours 06/03/24 0625 06/04/24 1315   06/03/24 1000  ceFEPIme  (MAXIPIME ) 2 g in sodium chloride  0.9 % 100 mL IVPB  Status:  Discontinued        2 g 200 mL/hr over 30 Minutes Intravenous Every 12 hours 06/03/24 0623 06/05/24 1224   06/02/24 2115  ceFEPIme  (MAXIPIME ) 2 g in  sodium chloride  0.9 % 100 mL IVPB        2 g 200 mL/hr over 30 Minutes Intravenous  Once 06/02/24 2111 06/02/24 2306   06/02/24 2115  metroNIDAZOLE  (FLAGYL ) IVPB 500 mg        500 mg 100 mL/hr over 60 Minutes Intravenous  Once 06/02/24 2111 06/03/24 0016   06/02/24 2115  vancomycin  (VANCOCIN ) IVPB 1000 mg/200 mL premix        1,000 mg 200 mL/hr over 60 Minutes Intravenous  Once 06/02/24 2111 06/03/24 0112       Subjective: Patient seen and evaluated today and is currently somnolent, but has remained hyper aroused and somewhat manic over the last 24 hours.  No significant agitation or combativeness noted.  Objective: Vitals:   06/12/24 0419 06/12/24 1347 06/12/24 1927 06/13/24 0612  BP: (!) 144/66 (!) 150/57 (!) 125/106 (!) 168/94  Pulse: (!) 55 (!) 51 (!) 57 72  Resp: 14 16 16 16   Temp: (!) 97.5 F (36.4 C) 97.7 F (36.5 C)  98.1 F (36.7 C)  TempSrc: Oral Oral  Oral  SpO2: 90% 95% 92% 91%    Intake/Output Summary (Last 24 hours) at 06/13/2024 1210 Last data filed at 06/12/2024 1700 Gross per 24 hour  Intake 240 ml  Output 900 ml  Net -660 ml   There were no vitals filed for this visit.  Examination:  General exam: Appears calm and comfortable, somnolent Respiratory system: Clear to auscultation. Respiratory effort normal. Cardiovascular system: S1 & S2 heard, RRR.  Gastrointestinal system: Abdomen is soft Central nervous system: Somnolent Extremities: No edema Skin: No significant lesions noted Psychiatry: Flat affect.    Data Reviewed: I have personally reviewed following labs and imaging studies  CBC: No results for input(s): WBC, NEUTROABS, HGB, HCT, MCV, PLT in the last 168 hours.  Basic Metabolic Panel: Recent Labs  Lab 06/10/24 0501  CREATININE 1.25*   GFR: Estimated Creatinine Clearance: 53 mL/min (A) (by C-G formula based on SCr of 1.25 mg/dL (H)). Liver Function Tests: No results for input(s): AST, ALT, ALKPHOS, BILITOT,  PROT, ALBUMIN in the last 168 hours.  No results for input(s): LIPASE, AMYLASE in the last 168 hours. No results for input(s): AMMONIA in the last 168 hours.  Coagulation Profile: No results for input(s): INR, PROTIME in the last 168 hours.  Cardiac Enzymes: No results for input(s): CKTOTAL, CKMB, CKMBINDEX, TROPONINI in the last 168 hours. BNP (last 3 results) Recent Labs    05/17/24 1101 06/02/24 2206  PROBNP 481.0* 138.0   HbA1C: No results for input(s): HGBA1C in the last 72 hours. CBG: Recent Labs  Lab 06/12/24 0722 06/12/24 1113 06/12/24 1656 06/12/24 2040 06/13/24 0733  GLUCAP 208* 152* 237* 242* 164*   Lipid Profile: No results for input(s): CHOL, HDL, LDLCALC, TRIG, CHOLHDL, LDLDIRECT in the last 72 hours. Thyroid  Function Tests: No results for input(s): TSH, T4TOTAL, FREET4, T3FREE, THYROIDAB in the last 72 hours. Anemia Panel: No results for input(s): VITAMINB12, FOLATE, FERRITIN, TIBC, IRON, RETICCTPCT in the last 72 hours. Sepsis Labs: No results for input(s): PROCALCITON, LATICACIDVEN in  the last 168 hours.   No results found for this or any previous visit (from the past 240 hours).        Radiology Studies: No results found.      Scheduled Meds:  atorvastatin   40 mg Oral QHS   cariprazine   4.5 mg Oral Daily   donepezil   10 mg Oral QHS   DULoxetine   30 mg Oral Daily   enoxaparin  (LOVENOX ) injection  40 mg Subcutaneous Q24H   famotidine   20 mg Oral BID   furosemide   20 mg Oral Daily   insulin  aspart  0-5 Units Subcutaneous QHS   insulin  aspart  0-9 Units Subcutaneous TID WC   insulin  glargine-yfgn  5 Units Subcutaneous QHS   levothyroxine   100 mcg Oral QAC breakfast   OLANZapine   10 mg Oral QHS   OLANZapine   5 mg Oral Daily   Oxcarbazepine   300 mg Oral BID   prazosin   1 mg Oral QHS   traZODone   100 mg Oral QHS   trihexyphenidyl   2 mg Oral QHS   ziprasidone   40 mg Oral BID  WC     LOS: 10 days    Time spent: 55 minutes    Piero Mustard Carrie Fairly, DO [facility]  If 7PM-7AM, please contact night-coverage [email or web address] 06/13/2024, 12:10 PM   "

## 2024-06-13 NOTE — Plan of Care (Signed)
" °  Problem: Nutritional: Goal: Maintenance of adequate nutrition will improve Outcome: Progressing   Problem: Clinical Measurements: Goal: Will remain free from infection Outcome: Progressing Goal: Diagnostic test results will improve Outcome: Progressing   Problem: Elimination: Goal: Will not experience complications related to bowel motility Outcome: Progressing Goal: Will not experience complications related to urinary retention Outcome: Progressing   Problem: Coping: Goal: Ability to adjust to condition or change in health will improve Outcome: Not Progressing   Problem: Activity: Goal: Risk for activity intolerance will decrease Outcome: Not Progressing   "

## 2024-06-14 DIAGNOSIS — N3000 Acute cystitis without hematuria: Secondary | ICD-10-CM | POA: Diagnosis not present

## 2024-06-14 LAB — GLUCOSE, CAPILLARY
Glucose-Capillary: 246 mg/dL — ABNORMAL HIGH (ref 70–99)
Glucose-Capillary: 294 mg/dL — ABNORMAL HIGH (ref 70–99)
Glucose-Capillary: 325 mg/dL — ABNORMAL HIGH (ref 70–99)
Glucose-Capillary: 210 mg/dL — ABNORMAL HIGH (ref 70–99)

## 2024-06-14 NOTE — Plan of Care (Signed)
  Problem: Coping: Goal: Level of anxiety will decrease Outcome: Progressing   Problem: Skin Integrity: Goal: Risk for impaired skin integrity will decrease Outcome: Progressing   

## 2024-06-14 NOTE — Plan of Care (Signed)
" °  Problem: Metabolic: Goal: Ability to maintain appropriate glucose levels will improve Outcome: Progressing   Problem: Nutritional: Goal: Maintenance of adequate nutrition will improve Outcome: Progressing   Problem: Clinical Measurements: Goal: Ability to maintain clinical measurements within normal limits will improve Outcome: Progressing   Problem: Clinical Measurements: Goal: Will remain free from infection Outcome: Progressing   "

## 2024-06-14 NOTE — Progress Notes (Signed)
 " PROGRESS NOTE    Carrie Clark  FMW:[id number] DOB: 03-18-1961 DOA: 06/02/2024 PCP: Lenora Lovena Mason, FNP   Brief Narrative:    64 y.o. female with medical history significant of hypertension, hypothyroidism, COPD, CKD 3B, type 2 diabetes mellitus, tobacco dependence, history of CVA, tardive dyskinesia who presents to the emergency department from [facility] via EMS due to altered mental status.    Admitted for acute metabolic encephalopathy in the setting of possible UTI. She is also hypothermic and is on warming blankets. Current temp is 97.2. Hypotension has resolved. She is a resident at [facility] and dependent on home O2.   Evening of 1/9 into AM of 1/10, remained agitated and combative to anyone coming into her room.  Received a shot of geodon  yesterday evening, all AM psych meds, early dose of 10 mg valium ,, became groggy.  IM Geodon  discontinued, oral Geodon  dose cut in half, home Valium  moved as needed.     Currently awaiting level 2 PASSAR.  Assessment & Plan:   Principal Problem:   UTI (urinary tract infection)  Assessment and Plan:   Acute metabolic encephalopathy possibly secondary to UTI-resolved - reason for initial admission.  Treated with 5 days of IV antibiotics.   Schizoaffective disorder, bipolar type Agitation Initially due to agitation she received higher doses of Geodon  and Valium  causing some overt sedation.  Slowly now that been adjusted, will slowly uptitrate as deemed appropriate.   Current medications: Cymbalta  30 mg daily Zyprexa  5 mg in the morning and 10 mg at bedtime Trileptal  300 mg twice daily Cariprazine  4.5mg  daily Geodon  40 mg twice daily (PTA 80mg  at bedtime) Valium  5 mg twice daily as needed (PTA scheduled)   Pharmacy to investigate  why patient was on both Zyprexa  and Geodon  outpatient.  Should follow-up outpatient psychiatry - Patient will need to wait another 24 hours before being discharged to facility  to ensure stability from mental standpoint.     Hypothermia; resolved.  Patient was placed on Bair hugger initially Continue to monitor patient's temperature--> resolved.  Likely secondary to UTI above.   Hypotension - resolved Received steroids and briefly was on Levophed .   Elevated BP - No formal documented history of the same. - Hydralazine  as needed written.  Systolics been rising as her agitation has increased in house. - better yesterday, now rising again.  Will hold on treatment and just use hydralazine  as needed as well.   Obesity class II (BMI 37.09) Diet and lifestyle modification   COPD  Continue Ventolin  as needed   Mixed hyperlipidemia Continue Lipitor   Type 2 diabetes mellitus, uncontrolled with hyperglycemia A1c on 05/16/24 was 9.7 Continue ISS and hypoglycemia protocol    Acquired hypothyroidism Continue Synthroid    CKD stage 3b Stable,baseline creatinine 1.0-1.3    DVT prophylaxis:Lovenox  Code Status: Full Family Communication: None at bedside Disposition Plan:  Status is: Inpatient Remains inpatient appropriate because: Need for placement and psychiatric stabilization.   Consultants:  None  Procedures:  None  Antimicrobials:  Anti-infectives (From admission, onward)    Start     Dose/Rate Route Frequency Ordered Stop   06/05/24 1600  cefTRIAXone  (ROCEPHIN ) 1 g in sodium chloride  0.9 % 100 mL IVPB        1 g 200 mL/hr over 30 Minutes Intravenous Every 24 hours 06/05/24 1224 06/09/24 2115   06/03/24 2200  vancomycin  (VANCOREADY) IVPB 750 mg/150 mL  Status:  Discontinued        750 mg 150  mL/hr over 60 Minutes Intravenous Every 24 hours 06/03/24 0625 06/04/24 1315   06/03/24 1000  ceFEPIme  (MAXIPIME ) 2 g in sodium chloride  0.9 % 100 mL IVPB  Status:  Discontinued        2 g 200 mL/hr over 30 Minutes Intravenous Every 12 hours 06/03/24 0623 06/05/24 1224   06/02/24 2115  ceFEPIme  (MAXIPIME ) 2 g in sodium chloride  0.9 % 100 mL IVPB         2 g 200 mL/hr over 30 Minutes Intravenous  Once 06/02/24 2111 06/02/24 2306   06/02/24 2115  metroNIDAZOLE  (FLAGYL ) IVPB 500 mg        500 mg 100 mL/hr over 60 Minutes Intravenous  Once 06/02/24 2111 06/03/24 0016   06/02/24 2115  vancomycin  (VANCOCIN ) IVPB 1000 mg/200 mL premix        1,000 mg 200 mL/hr over 60 Minutes Intravenous  Once 06/02/24 2111 06/03/24 0112       Subjective: Patient seen and evaluated today and is currently stable from psychiatric standpoint with no acute events noted overnight.  Patient is awaiting PASSR for placement.  Objective: Vitals:   06/13/24 0612 06/13/24 1405 06/13/24 2035 06/14/24 0534  BP: (!) 168/94 (!) 146/67 (!) 142/64 119/70  Pulse: 72 82 80 73  Resp: 16 17 18 18   Temp: 98.1 F (36.7 C) 98.7 F (37.1 C) 98.3 F (36.8 C) 97.8 F (36.6 C)  TempSrc: Oral Oral  Oral  SpO2: 91% 91% 95% 97%    Intake/Output Summary (Last 24 hours) at 06/14/2024 1016 Last data filed at 06/13/2024 1700 Gross per 24 hour  Intake 240 ml  Output --  Net 240 ml   There were no vitals filed for this visit.  Examination:  General exam: Appears calm and comfortable, somnolent Respiratory system: Clear to auscultation. Respiratory effort normal. Cardiovascular system: S1 & S2 heard, RRR.  Gastrointestinal system: Abdomen is soft Central nervous system: Somnolent Extremities: No edema Skin: No significant lesions noted Psychiatry: Flat affect.    Data Reviewed: I have personally reviewed following labs and imaging studies  CBC: No results for input(s): WBC, NEUTROABS, HGB, HCT, MCV, PLT in the last 168 hours.  Basic Metabolic Panel: Recent Labs  Lab 06/10/24 0501  CREATININE 1.25*   GFR: CrCl cannot be calculated (Unknown ideal weight.). Liver Function Tests: No results for input(s): AST, ALT, ALKPHOS, BILITOT, PROT, ALBUMIN in the last 168 hours.  No results for input(s): LIPASE, AMYLASE in the last 168 hours. No  results for input(s): AMMONIA in the last 168 hours.  Coagulation Profile: No results for input(s): INR, PROTIME in the last 168 hours.  Cardiac Enzymes: No results for input(s): CKTOTAL, CKMB, CKMBINDEX, TROPONINI in the last 168 hours. BNP (last 3 results) Recent Labs    05/17/24 1101 06/02/24 2206  PROBNP 481.0* 138.0   HbA1C: No results for input(s): HGBA1C in the last 72 hours. CBG: Recent Labs  Lab 06/12/24 2040 06/13/24 0733 06/13/24 1601 06/13/24 2034 06/14/24 0729  GLUCAP 242* 164* 273* 342* 246*   Lipid Profile: No results for input(s): CHOL, HDL, LDLCALC, TRIG, CHOLHDL, LDLDIRECT in the last 72 hours. Thyroid  Function Tests: No results for input(s): TSH, T4TOTAL, FREET4, T3FREE, THYROIDAB in the last 72 hours. Anemia Panel: No results for input(s): VITAMINB12, FOLATE, FERRITIN, TIBC, IRON, RETICCTPCT in the last 72 hours. Sepsis Labs: No results for input(s): PROCALCITON, LATICACIDVEN in the last 168 hours.   No results found for this or any previous visit (from the past 240  hours).        Radiology Studies: No results found.      Scheduled Meds:  atorvastatin   40 mg Oral QHS   cariprazine   4.5 mg Oral Daily   donepezil   10 mg Oral QHS   DULoxetine   30 mg Oral Daily   enoxaparin  (LOVENOX ) injection  40 mg Subcutaneous Q24H   famotidine   20 mg Oral BID   furosemide   20 mg Oral Daily   insulin  aspart  0-5 Units Subcutaneous QHS   insulin  aspart  0-9 Units Subcutaneous TID WC   insulin  glargine-yfgn  5 Units Subcutaneous QHS   levothyroxine   100 mcg Oral QAC breakfast   OLANZapine   10 mg Oral QHS   OLANZapine   5 mg Oral Daily   Oxcarbazepine   300 mg Oral BID   prazosin   1 mg Oral QHS   traZODone   100 mg Oral QHS   trihexyphenidyl   2 mg Oral QHS   ziprasidone   40 mg Oral BID WC     LOS: 11 days    Time spent: 55 minutes    Cola Highfill JONETTA Fairly, DO [facility]  If 7PM-7AM,  please contact night-coverage [email or web address] 06/14/2024, 10:16 AM   "

## 2024-06-14 NOTE — Progress Notes (Signed)
 Mobility Specialist Progress Note:    06/14/24 1430  Mobility  Activity Ambulated with assistance  Level of Assistance Minimal assist, patient does 75% or more  Assistive Device Front wheel walker  Distance Ambulated (ft) 30 ft  Range of Motion/Exercises Active;All extremities  Activity Response Tolerated well  Mobility Referral Yes  Mobility visit 1 Mobility  Mobility Specialist Start Time (ACUTE ONLY) 1430  Mobility Specialist Stop Time (ACUTE ONLY) 1450  Mobility Specialist Time Calculation (min) (ACUTE ONLY) 20 min   Pt received in bed, agreeable to mobility. Required MinA to stand and CGA to ambulate with RW. Tolerated well, confused and required many verbal cues during session. Left in chair, alarm on. Call bell in reach, nurse in room. All needs met.  Rimsha Trembley Mobility Specialist Please contact via Special Educational Needs Teacher or  Rehab office at [phone]

## 2024-06-15 DIAGNOSIS — N3000 Acute cystitis without hematuria: Secondary | ICD-10-CM | POA: Diagnosis not present

## 2024-06-15 LAB — GLUCOSE, CAPILLARY
Glucose-Capillary: 205 mg/dL — ABNORMAL HIGH (ref 70–99)
Glucose-Capillary: 388 mg/dL — ABNORMAL HIGH (ref 70–99)
Glucose-Capillary: 394 mg/dL — ABNORMAL HIGH (ref 70–99)
Glucose-Capillary: 177 mg/dL — ABNORMAL HIGH (ref 70–99)

## 2024-06-15 NOTE — Care Management Important Message (Deleted)
 Important Message  Patient Details  Name: Carrie Clark MRN: [id number] Date of Birth: 1961-04-05   Important Message Given:  Yes - Medicare IM (copy mailed to [address], [place], [place] [address])     [name] 06/15/2024, 2:17 PM

## 2024-06-15 NOTE — TOC Progression Note (Signed)
 Transition of Care [facility]) - Progression Note    Patient Details  Name: Carrie Clark MRN: [id number] Date of Birth: [birth year]  [facility]) CM/SW Contact  Hoy DELENA Bigness, LCSW Phone Number: 06/15/2024, 2:04 PM  Clinical Narrative:    Spoke with Debbie at [facility] who shares they are able to accept pt and have a bed available for pt.  Ashok Shoulder with [facility] met with pt to complete evaluation for PASRR. PASRR number still pending.  Insurance shara has been requested and is currently pending.    Expected Discharge Plan: Skilled Nursing Facility Barriers to Discharge: No SNF bed               Expected Discharge Plan and Services In-house Referral: Clinical Social Work Discharge Planning Services: CM Consult Post Acute Care Choice: Skilled Nursing Facility Living arrangements for the past 2 months: Group Home Expected Discharge Date: 06/10/24               DME Arranged: N/A DME Agency: NA                   Social Drivers of Health (SDOH) Interventions SDOH Screenings   Food Insecurity: No Food Insecurity (06/05/2024)  Housing: Patient Unable To Answer (06/05/2024)  Transportation Needs: No Transportation Needs (06/05/2024)  Utilities: Not At Risk (06/05/2024)  Tobacco Use: Low Risk (06/03/2024)    Readmission Risk Interventions    06/10/2024   12:47 PM 06/07/2024    2:57 PM 06/04/2024   11:54 AM  Readmission Risk Prevention Plan  Transportation Screening Complete Complete Complete  PCP or Specialist Appt within 3-5 Days Complete    HRI or Home Care Consult Complete Complete Complete  Social Work Consult for Recovery Care Planning/Counseling Complete Complete Complete  Palliative Care Screening Not Applicable Not Applicable Not Applicable  Medication Review Oceanographer) Complete Complete Complete

## 2024-06-15 NOTE — Plan of Care (Signed)
" °  Problem: Coping: Goal: Ability to adjust to condition or change in health will improve Outcome: Progressing   Problem: Fluid Volume: Goal: Ability to maintain a balanced intake and output will improve Outcome: Progressing   Problem: Metabolic: Goal: Ability to maintain appropriate glucose levels will improve Outcome: Progressing   Problem: Nutritional: Goal: Maintenance of adequate nutrition will improve Outcome: Progressing   Problem: Skin Integrity: Goal: Risk for impaired skin integrity will decrease Outcome: Progressing   Problem: Tissue Perfusion: Goal: Adequacy of tissue perfusion will improve Outcome: Progressing   Problem: Clinical Measurements: Goal: Ability to maintain clinical measurements within normal limits will improve Outcome: Progressing Goal: Will remain free from infection Outcome: Progressing Goal: Diagnostic test results will improve Outcome: Progressing Goal: Respiratory complications will improve Outcome: Progressing   Problem: Activity: Goal: Risk for activity intolerance will decrease Outcome: Progressing   Problem: Coping: Goal: Level of anxiety will decrease Outcome: Progressing   Problem: Elimination: Goal: Will not experience complications related to bowel motility Outcome: Progressing Goal: Will not experience complications related to urinary retention Outcome: Progressing   Problem: Safety: Goal: Ability to remain free from injury will improve Outcome: Progressing   Problem: Skin Integrity: Goal: Risk for impaired skin integrity will decrease Outcome: Progressing   "

## 2024-06-15 NOTE — Progress Notes (Signed)
 Mobility Specialist Progress Note:    06/15/24 0900  Mobility  Activity Ambulated with assistance  Level of Assistance Minimal assist, patient does 75% or more  Assistive Device Front wheel walker  Distance Ambulated (ft) 15 ft  Range of Motion/Exercises Active;All extremities  Activity Response Tolerated well  Mobility Referral Yes  Mobility visit 1 Mobility  Mobility Specialist Start Time (ACUTE ONLY) 0900  Mobility Specialist Stop Time (ACUTE ONLY) 0920  Mobility Specialist Time Calculation (min) (ACUTE ONLY) 20 min   Pt received in bed, agreeable to mobility. Required MinA to stand and CGA to ambulate with RW. Tolerated well, required many verbal cues during session. Left in chair, alarm on. Call bell in reach, all needs met.  Padme Arriaga Mobility Specialist Please contact via Special Educational Needs Teacher or  Rehab office at [phone]

## 2024-06-15 NOTE — Progress Notes (Signed)
 Physical Therapy Treatment Patient Details Name: Carrie Clark MRN: [id number] DOB: 09-Mar-1961 Today's Date: 06/15/2024   History of Present Illness Carrie Clark is a 64 y.o. female with medical history significant of hypertension, hypothyroidism, COPD, CKD 3B, type 2 diabetes mellitus, tobacco dependence, history of CVA, tardive dyskinesia who presents to the emergency department from [facility] via EMS due to altered mental status.  EMS was activated due to concern for altered mental status, patient was on few CNS acting drugs  Patient was somnolent at bedside, she was arousable, but quickly goes back to sleep and was unable to provide any history.  History was obtained from EDP and ED medical record.    PT Comments  Patient presents seated in chair (assisted by nursing staff) and agreeable for therapy. Patient demonstrates fair/poor carryover for following directions while completing BLE exercises and limited to completing LAQ's, increased endurance/distance for gait training with slow slightly labored movement without loss of balance, but, limited due to c/o fatigue. Patient put back to bed after therapy to be cleaned by nursing staff. Patient will benefit from continued skilled physical therapy in hospital and recommended venue below to increase strength, balance, endurance for safe ADLs and gait.     If plan is discharge home, recommend the following: A lot of help with bathing/dressing/bathroom;A lot of help with walking and/or transfers;Help with stairs or ramp for entrance;Assist for transportation;Assistance with cooking/housework   Can travel by private vehicle     No  Equipment Recommendations  None recommended by PT    Recommendations for Other Services       Precautions / Restrictions Precautions Precautions: Fall Recall of Precautions/Restrictions: Intact Restrictions Weight Bearing Restrictions Per Provider Order: No     Mobility  Bed Mobility Overal bed mobility:  Needs Assistance Bed Mobility: Sit to Supine     Supine to sit: Min assist     General bed mobility comments: labored movement, mild diffiuclty moving legs onto bed    Transfers Overall transfer level: Needs assistance Equipment used: Rolling walker (2 wheels) Transfers: Sit to/from Stand, Bed to chair/wheelchair/BSC Sit to Stand: Min assist   Step pivot transfers: Min assist       General transfer comment: unsteady labored movement    Ambulation/Gait Ambulation/Gait assistance: Mod assist, Min assist Gait Distance (Feet): 35 Feet Assistive device: Rolling walker (2 wheels) Gait Pattern/deviations: Decreased step length - right, Decreased step length - left, Decreased stride length Gait velocity: decreased     General Gait Details: increased endurance/distance for gait training using RW with slow slightly labored movment, no loss of balance and limited mostly due to fatigue   Stairs             Wheelchair Mobility     Tilt Bed    Modified Rankin (Stroke Patients Only)       Balance Overall balance assessment: Needs assistance Sitting-balance support: Feet supported, No upper extremity supported Sitting balance-Leahy Scale: Good Sitting balance - Comments: seated at EOB   Standing balance support: Reliant on assistive device for balance, During functional activity, Bilateral upper extremity supported Standing balance-Leahy Scale: Poor Standing balance comment: fair/poor using RW                            Communication Communication Communication: No apparent difficulties  Cognition Arousal: Alert Behavior During Therapy: Anxious, WFL for tasks assessed/performed  Following commands: Impaired Following commands impaired: Follows one step commands with increased time    Cueing Cueing Techniques: Verbal cues, Tactile cues  Exercises General Exercises - Lower Extremity Long Arc Quad: AROM,  Seated, Strengthening, Both, 10 reps    General Comments        Pertinent Vitals/Pain Pain Assessment Pain Assessment: No/denies pain    Home Living                          Prior Function            PT Goals (current goals can now be found in the care plan section) Acute Rehab PT Goals Patient Stated Goal: return home Time For Goal Achievement: 06/21/24 Potential to Achieve Goals: Good Progress towards PT goals: Progressing toward goals    Frequency    Min 3X/week      PT Plan      Co-evaluation              AM-PAC PT 6 Clicks Mobility   Outcome Measure  Help needed turning from your back to your side while in a flat bed without using bedrails?: A Little Help needed moving from lying on your back to sitting on the side of a flat bed without using bedrails?: A Little Help needed moving to and from a bed to a chair (including a wheelchair)?: A Little Help needed standing up from a chair using your arms (e.g., wheelchair or bedside chair)?: A Little Help needed to walk in hospital room?: A Lot Help needed climbing 3-5 steps with a railing? : A Lot 6 Click Score: 16    End of Session Equipment Utilized During Treatment: Gait belt Activity Tolerance: Patient tolerated treatment well;Patient limited by fatigue Patient left: in bed;with call bell/phone within reach;with nursing/sitter in room Nurse Communication: Mobility status PT Visit Diagnosis: Unsteadiness on feet (R26.81);Other abnormalities of gait and mobility (R26.89);Muscle weakness (generalized) (M62.81)     Time: 8975-8955 PT Time Calculation (min) (ACUTE ONLY): 20 min  Charges:    $Gait Training: 8-22 mins $Therapeutic Activity: 8-22 mins PT General Charges $$ ACUTE PT VISIT: 1 Visit                     12:26 PM, 06/15/24 Lynwood Music, MPT Physical Therapist with [facility] [phone] office [phone] mobile phone

## 2024-06-15 NOTE — Progress Notes (Signed)
 " PROGRESS NOTE    Carrie Clark  FMW:[id number] DOB: 04/05/1961 DOA: 06/02/2024 PCP: Lenora Lovena Mason, FNP   Brief Narrative:    64 y.o. female with medical history significant of hypertension, hypothyroidism, COPD, CKD 3B, type 2 diabetes mellitus, tobacco dependence, history of CVA, tardive dyskinesia who presents to the emergency department from [facility] via EMS due to altered mental status.    Admitted for acute metabolic encephalopathy in the setting of possible UTI. She is also hypothermic and is on warming blankets. Current temp is 97.2. Hypotension has resolved. She is a resident at [facility] and dependent on home O2.   Evening of 1/9 into AM of 1/10, remained agitated and combative to anyone coming into her room.  Received a shot of geodon  yesterday evening, all AM psych meds, early dose of 10 mg valium ,, became groggy.  IM Geodon  discontinued, oral Geodon  dose cut in half, home Valium  moved as needed.     Currently awaiting level 2 PASSAR and insurance auth for SNF.  Assessment & Plan:   Principal Problem:   UTI (urinary tract infection)  Assessment and Plan:   Acute metabolic encephalopathy possibly secondary to UTI-resolved - reason for initial admission.  Treated with 5 days of IV antibiotics.   Schizoaffective disorder, bipolar type Agitation Initially due to agitation she received higher doses of Geodon  and Valium  causing some overt sedation.  Slowly now that been adjusted, will slowly uptitrate as deemed appropriate.   Current medications: Cymbalta  30 mg daily Zyprexa  5 mg in the morning and 10 mg at bedtime Trileptal  300 mg twice daily Cariprazine  4.5mg  daily Geodon  40 mg twice daily (PTA 80mg  at bedtime) Valium  5 mg twice daily as needed (PTA scheduled)   Pharmacy to investigate  why patient was on both Zyprexa  and Geodon  outpatient.  Should follow-up outpatient psychiatry - Patient will need to wait another 24 hours before  being discharged to facility to ensure stability from mental standpoint.     Hypothermia; resolved.  Patient was placed on Bair hugger initially Continue to monitor patient's temperature--> resolved.  Likely secondary to UTI above.   Hypotension - resolved Received steroids and briefly was on Levophed .   Elevated BP - No formal documented history of the same. - Hydralazine  as needed written.  Systolics been rising as her agitation has increased in house. - better yesterday, now rising again.  Will hold on treatment and just use hydralazine  as needed as well.   Obesity class II (BMI 37.09) Diet and lifestyle modification   COPD  Continue Ventolin  as needed   Mixed hyperlipidemia Continue Lipitor   Type 2 diabetes mellitus, uncontrolled with hyperglycemia A1c on 05/16/24 was 9.7 Continue ISS and hypoglycemia protocol    Acquired hypothyroidism Continue Synthroid    CKD stage 3b Stable,baseline creatinine 1.0-1.3    DVT prophylaxis:Lovenox  Code Status: Full Family Communication: None at bedside Disposition Plan:  Status is: Inpatient Remains inpatient appropriate because: Need for placement and psychiatric stabilization.   Consultants:  None  Procedures:  None  Antimicrobials:  Anti-infectives (From admission, onward)    Start     Dose/Rate Route Frequency Ordered Stop   06/05/24 1600  cefTRIAXone  (ROCEPHIN ) 1 g in sodium chloride  0.9 % 100 mL IVPB        1 g 200 mL/hr over 30 Minutes Intravenous Every 24 hours 06/05/24 1224 06/09/24 2115   06/03/24 2200  vancomycin  (VANCOREADY) IVPB 750 mg/150 mL  Status:  Discontinued  750 mg 150 mL/hr over 60 Minutes Intravenous Every 24 hours 06/03/24 0625 06/04/24 1315   06/03/24 1000  ceFEPIme  (MAXIPIME ) 2 g in sodium chloride  0.9 % 100 mL IVPB  Status:  Discontinued        2 g 200 mL/hr over 30 Minutes Intravenous Every 12 hours 06/03/24 0623 06/05/24 1224   06/02/24 2115  ceFEPIme  (MAXIPIME ) 2 g in sodium  chloride 0.9 % 100 mL IVPB        2 g 200 mL/hr over 30 Minutes Intravenous  Once 06/02/24 2111 06/02/24 2306   06/02/24 2115  metroNIDAZOLE  (FLAGYL ) IVPB 500 mg        500 mg 100 mL/hr over 60 Minutes Intravenous  Once 06/02/24 2111 06/03/24 0016   06/02/24 2115  vancomycin  (VANCOCIN ) IVPB 1000 mg/200 mL premix        1,000 mg 200 mL/hr over 60 Minutes Intravenous  Once 06/02/24 2111 06/03/24 0112       Subjective: Patient seen and evaluated today and is currently stable from psychiatric standpoint with no acute events noted overnight.  Patient is awaiting PASSR and insurance auth for placement to SNF.  Objective: Vitals:   06/14/24 1250 06/14/24 2126 06/15/24 0601 06/15/24 1305  BP: 120/73 (!) 158/72 (!) 144/67 (!) 153/71  Pulse: 75 75 (!) 54 (!) 55  Resp: 18 19 16 17   Temp: 98 F (36.7 C) 97.6 F (36.4 C) 97.8 F (36.6 C) 98.2 F (36.8 C)  TempSrc: Oral Axillary Oral Oral  SpO2: 97% 100% 92% 91%    Intake/Output Summary (Last 24 hours) at 06/15/2024 1626 Last data filed at 06/15/2024 1317 Gross per 24 hour  Intake 720 ml  Output 800 ml  Net -80 ml   There were no vitals filed for this visit.  Examination:  General exam: Appears calm and comfortable Respiratory system: Clear to auscultation. Respiratory effort normal. Cardiovascular system: S1 & S2 heard, RRR.  Gastrointestinal system: Abdomen is soft Central nervous system: awake and alert Extremities: No edema Skin: No significant lesions noted Psychiatry: Flat affect.    Data Reviewed: I have personally reviewed following labs and imaging studies  CBC: No results for input(s): WBC, NEUTROABS, HGB, HCT, MCV, PLT in the last 168 hours.  Basic Metabolic Panel: Recent Labs  Lab 06/10/24 0501  CREATININE 1.25*   GFR: CrCl cannot be calculated (Unknown ideal weight.). Liver Function Tests: No results for input(s): AST, ALT, ALKPHOS, BILITOT, PROT, ALBUMIN in the last 168  hours.  No results for input(s): LIPASE, AMYLASE in the last 168 hours. No results for input(s): AMMONIA in the last 168 hours.  Coagulation Profile: No results for input(s): INR, PROTIME in the last 168 hours.  Cardiac Enzymes: No results for input(s): CKTOTAL, CKMB, CKMBINDEX, TROPONINI in the last 168 hours. BNP (last 3 results) Recent Labs    05/17/24 1101 06/02/24 2206  PROBNP 481.0* 138.0   HbA1C: No results for input(s): HGBA1C in the last 72 hours. CBG: Recent Labs  Lab 06/14/24 1605 06/14/24 2120 06/15/24 0746 06/15/24 1114 06/15/24 1618  GLUCAP 325* 210* 205* 388* 394*   Lipid Profile: No results for input(s): CHOL, HDL, LDLCALC, TRIG, CHOLHDL, LDLDIRECT in the last 72 hours. Thyroid  Function Tests: No results for input(s): TSH, T4TOTAL, FREET4, T3FREE, THYROIDAB in the last 72 hours. Anemia Panel: No results for input(s): VITAMINB12, FOLATE, FERRITIN, TIBC, IRON, RETICCTPCT in the last 72 hours. Sepsis Labs: No results for input(s): PROCALCITON, LATICACIDVEN in the last 168 hours.   No  results found for this or any previous visit (from the past 240 hours).        Radiology Studies: No results found.      Scheduled Meds:  atorvastatin   40 mg Oral QHS   cariprazine   4.5 mg Oral Daily   donepezil   10 mg Oral QHS   DULoxetine   30 mg Oral Daily   enoxaparin  (LOVENOX ) injection  40 mg Subcutaneous Q24H   famotidine   20 mg Oral BID   furosemide   20 mg Oral Daily   insulin  aspart  0-5 Units Subcutaneous QHS   insulin  aspart  0-9 Units Subcutaneous TID WC   insulin  glargine-yfgn  5 Units Subcutaneous QHS   levothyroxine   100 mcg Oral QAC breakfast   OLANZapine   10 mg Oral QHS   OLANZapine   5 mg Oral Daily   Oxcarbazepine   300 mg Oral BID   prazosin   1 mg Oral QHS   traZODone   100 mg Oral QHS   trihexyphenidyl   2 mg Oral QHS   ziprasidone   40 mg Oral BID WC     LOS: 12 days    Time  spent: 55 minutes    Valena Ivanov JONETTA Fairly, DO [facility]  If 7PM-7AM, please contact night-coverage [email or web address] 06/15/2024, 4:26 PM   "

## 2024-06-16 DIAGNOSIS — N3000 Acute cystitis without hematuria: Secondary | ICD-10-CM | POA: Diagnosis not present

## 2024-06-16 DIAGNOSIS — G9341 Metabolic encephalopathy: Secondary | ICD-10-CM | POA: Diagnosis not present

## 2024-06-16 DIAGNOSIS — N183 Chronic kidney disease, stage 3 unspecified: Secondary | ICD-10-CM | POA: Diagnosis not present

## 2024-06-16 LAB — GLUCOSE, CAPILLARY
Glucose-Capillary: 298 mg/dL — ABNORMAL HIGH (ref 70–99)
Glucose-Capillary: 436 mg/dL — ABNORMAL HIGH (ref 70–99)

## 2024-06-16 MED ORDER — ALBUTEROL SULFATE HFA 108 (90 BASE) MCG/ACT IN AERS: 2.0000 | INHALATION_SPRAY | Freq: Four times a day (QID) | RESPIRATORY_TRACT | 1 refills | Status: AC | PRN

## 2024-06-16 MED ORDER — INSULIN GLARGINE 100 UNIT/ML ~~LOC~~ SOLN: 15.0000 [IU] | Freq: Every day | SUBCUTANEOUS | 11 refills | Status: AC

## 2024-06-16 MED ORDER — DIAZEPAM 5 MG PO TABS: 5.0000 mg | ORAL_TABLET | Freq: Once | ORAL | Status: DC

## 2024-06-16 MED ORDER — INSULIN ASPART 100 UNIT/ML IJ SOLN
20.0000 [IU] | Freq: Once | INTRAMUSCULAR | Status: AC
  Administered 2024-06-16: 20 [IU] via SUBCUTANEOUS
  Filled 2024-06-16: qty 1

## 2024-06-16 NOTE — Discharge Summary (Signed)
 "                                                                                 Carrie Clark, is a 64 y.o. female  DOB 02-May-1961  MRN [id number].  Admission date:  06/02/2024  Admitting Physician  Deliliah Room, MD  Discharge Date:  06/16/2024   Primary MD  Lenora Lovena Mason, FNP  Recommendations for primary care physician for things to follow:  1)Avoid ibuprofen /Advil /Aleve/Motrin Josefine Powders/Naproxen/BC powders/Meloxicam/Diclofenac /Indomethacin and other Nonsteroidal anti-inflammatory medications as these will make you more likely to bleed and can cause stomach ulcers, can also cause Kidney problems.   2)Please note that there has been changes to your medications--  Admission Diagnosis  UTI (urinary tract infection) [N39.0] Acute cystitis without hematuria [N30.00] Hypothermia, initial encounter [T68.XXXA] Hypotension, unspecified hypotension type [I95.9] Altered mental status, unspecified altered mental status type [R41.82]   Discharge Diagnosis  UTI (urinary tract infection) [N39.0] Acute cystitis without hematuria [N30.00] Hypothermia, initial encounter [T68.XXXA] Hypotension, unspecified hypotension type [I95.9] Altered mental status, unspecified altered mental status type [R41.82]    Principal Problem:   UTI (urinary tract infection)      Past Medical History:  Diagnosis Date   COPD (chronic obstructive pulmonary disease) (HCC)    CVA (cerebral infarction)    Depression    Hypertension    Hypothyroidism    Schizoaffective disorder, bipolar type (HCC)    Schizophrenia (HCC)    Stroke (HCC)    Tardive dyskinesia    Tardive dyskinesia     Past Surgical History:  Procedure Laterality Date   NO PAST SURGERIES       HPI  from the history and physical done on the day of admission:   HPI: [name] is a 64 y.o. female with medical history significant of hypertension, hypothyroidism, COPD, CKD 3B, type 2 diabetes mellitus, tobacco dependence, history of CVA,  tardive dyskinesia who presents to the emergency department from [facility] place via EMS due to altered mental status.  EMS was activated due to concern for altered mental status, patient was on few CNS acting drugs Patient was somnolent at bedside, she was arousable, but quickly goes back to sleep and was unable to provide any history.  History was obtained from EDP and ED medical record.   ED course In the emergency department, patient was hypothermic with temperature of 92.59F, other vital signs were within normal range.  Workup in the ED showed normocytic anemia and platelets of 136.  BMP showed bicarb of 41, blood glucose 148, BUN 37, creatinine 1.45, eGFR 41, urine drug screen was positive for benzodiazepine, urinalysis was positive for UTI CT head without contrast showed no acute intracranial pathology. CT abdominal pelvis was suggestive of gas-forming UTI CT angiography of chest ruled out pulmonary embolism She was initially started on IV Levophed  due to soft BP, but was weaned off this.  IV Solu-Cortef , Flagyl  and vancomycin  and IV cefepime  given.     Review of Systems: As mentioned in the history of present illness. All other systems reviewed and are negative.   Hospital Course:     Brief Narrative:   64 y.o. female with medical history significant of hypertension, hypothyroidism,  COPD, CKD 3B, type 2 diabetes mellitus, tobacco dependence, history of CVA, tardive dyskinesia who presents to the emergency department from [facility] via EMS due to altered mental status.    Admitted for acute metabolic encephalopathy in the setting of possible UTI. She is also hypothermic and is on warming blankets. Current temp is 97.2. Hypotension has resolved. She is a resident at [facility] and dependent on home O2.   Evening of 1/9 into AM of 1/10, remained agitated and combative to anyone coming into her room.  Received a shot of geodon  yesterday evening, all AM psych meds,  early dose of 10 mg valium ,, became groggy.  IM Geodon  discontinued, oral Geodon  dose cut in half, home Valium  moved as needed.    Assessment & Plan:  1)Acute metabolic encephalopathy possibly secondary to UTI - reason for initial admission.   --It appears that no urine culture was sent Patient completed 5 days of IV antibiotics--- no further antibiotics indicated at this time --Encephalopathy overall improved, patient appears back to baseline   2)Schizoaffective disorder, bipolar type Agitation Initially due to agitation she received higher doses of Geodon  and Valium  causing some overt sedation.  Slowly now that been adjusted, will slowly uptitrate as deemed appropriate.  Current Mental Health medications: Cymbalta  30 mg daily Zyprexa  5 mg in the morning and 10 mg at bedtime Trileptal  300 mg twice daily Cariprazine  4.5mg  daily Valium  5 mg twice daily as needed (PTA scheduled) - Trazodone  100 mg nightly   Hypothermia; resolved.  Patient was placed on Bair hugger initially -Hypothermia has resolved   Hypotension - resolved Received steroids and briefly was on Levophed .   Obesity class II (BMI 37.09) Diet and lifestyle modification   COPD  Stable, no acute flareup, continue bronchodilators as needed   Mixed hyperlipidemia Continue Lipitor   Type 2 diabetes mellitus, uncontrolled with hyperglycemia A1c on 05/16/24 was 9.7 Continue Lantus  and Use Novolog /Humalog Sliding scale insulin  with Accu-Cheks/Fingersticks as ordered    Acquired hypothyroidism Continue levothyroxine    CKD stage 3b Stable,baseline creatinine 1.0-1.3  Code Status:   Code Status: Full Code  Discharge Condition: stable   Follow UP   Contact information for follow-up providers     Lenora Lovena Mason, FNP Follow up in 1 week(s).   Specialty: Family Medicine Contact information: [address], [address] [place] [address] [phone]              Contact information for  after-discharge care     Destination     [facility] .   Service: Skilled Nursing Contact information: [address] [place]  [address] [phone]                     Diet and Activity recommendation:  As advised  Discharge Instructions    Discharge Instructions     Call MD for:  difficulty breathing, headache or visual disturbances   Complete by: As directed    Call MD for:  persistant dizziness or light-headedness   Complete by: As directed    Call MD for:  persistant nausea and vomiting   Complete by: As directed    Call MD for:  temperature >100.4   Complete by: As directed    Diet - low sodium heart healthy   Complete by: As directed    Diet Carb Modified   Complete by: As directed    Discharge instructions   Complete by: As directed    1)Avoid ibuprofen /Advil /Aleve/Motrin Josefine  Powders/Naproxen/BC powders/Meloxicam/Diclofenac /Indomethacin and other Nonsteroidal anti-inflammatory medications as these will make you more likely to bleed and can cause stomach ulcers, can also cause Kidney problems.   2)Please note that there has been changes to your medications--   Increase activity slowly   Complete by: As directed    Increase activity slowly   Complete by: As directed        Discharge Medications     Allergies as of 06/16/2024       Reactions   Fish Allergy Other (See Comments)   Allergy reaction not listed- on MAR        Medication List     STOP taking these medications    cefdinir  300 MG capsule Commonly known as: OMNICEF    doxycycline  100 MG tablet Commonly known as: VIBRA -TABS   potassium chloride  SA 20 MEQ tablet Commonly known as: KLOR-CON  M   ziprasidone  80 MG capsule Commonly known as: GEODON    zolpidem  5 MG tablet Commonly known as: AMBIEN        TAKE these medications    trihexyphenidyl  2 MG tablet Commonly known as: ARTANE  Take 1 tablet (2 mg total) by mouth at bedtime. The timing of this  medication is very important.   acetaminophen  325 MG tablet Commonly known as: TYLENOL  Take 650 mg by mouth every 6 (six) hours as needed for moderate pain (pain score 4-6).   albuterol  108 (90 Base) MCG/ACT inhaler Commonly known as: VENTOLIN  HFA Inhale 2 puffs into the lungs every 6 (six) hours as needed for wheezing or shortness of breath. What changed: how much to take   atorvastatin  40 MG tablet Commonly known as: LIPITOR Take 40 mg by mouth at bedtime.   diazepam  5 MG tablet Commonly known as: VALIUM  Take 1 tablet (5 mg total) by mouth every 12 (twelve) hours as needed for anxiety (agitation). What changed:  medication strength how much to take when to take this reasons to take this   diclofenac  Sodium 1 % Gel Commonly known as: VOLTAREN  Apply 2 g topically 4 (four) times daily as needed (pain).   donepezil  10 MG tablet Commonly known as: ARICEPT  Take 10 mg by mouth at bedtime.   DULoxetine  30 MG capsule Commonly known as: Cymbalta  Take 1 capsule (30 mg total) by mouth daily.   famotidine  20 MG tablet Commonly known as: PEPCID  Take 20 mg by mouth 2 (two) times daily.   furosemide  20 MG tablet Commonly known as: LASIX  Take 1 tablet (20 mg total) by mouth daily.   glycopyrrolate  1 MG tablet Commonly known as: ROBINUL  Take 1 mg by mouth 2 (two) times daily.   insulin  aspart 100 UNIT/ML injection Commonly known as: novoLOG  Inject 2-10 Units into the skin 3 (three) times daily before meals. Sliding scale: check sugar before meals <200=0; 200-250=2 units;251-300=4 units;301-350=6 units;351-400= 8 units;>400=10 units and call MD   insulin  glargine 100 UNIT/ML injection Commonly known as: LANTUS  Inject 0.15 mLs (15 Units total) into the skin daily. What changed: additional instructions   levocetirizine 5 MG tablet Commonly known as: XYZAL  Take 5 mg by mouth daily.   levothyroxine  100 MCG tablet Commonly known as: SYNTHROID  Take 100 mcg by mouth daily  before breakfast.   OLANZapine  10 MG tablet Commonly known as: ZYPREXA  Take 1 tablet (10 mg total) by mouth at bedtime. What changed:  medication strength how much to take when to take this Another medication with the same name was removed. Continue taking this medication, and follow the directions you  see here.   OLANZapine  5 MG tablet Commonly known as: ZYPREXA  Take 1 tablet (5 mg total) by mouth daily. What changed:  medication strength when to take this Another medication with the same name was removed. Continue taking this medication, and follow the directions you see here.   Oxcarbazepine  300 MG tablet Commonly known as: TRILEPTAL  Take 1 tablet (300 mg total) by mouth 2 (two) times daily.   prazosin  1 MG capsule Commonly known as: MINIPRESS  Take 1 mg by mouth at bedtime.   Spiriva  HandiHaler 18 MCG Caps Generic drug: Tiotropium Bromide  Irrigate with 1 capsule as directed daily.   traZODone  100 MG tablet Commonly known as: DESYREL  Take 100 mg by mouth at bedtime.   Vraylar  4.5 MG Caps Generic drug: Cariprazine  HCl Take 1 capsule by mouth daily.       Major procedures and Radiology Reports - PLEASE review detailed and final reports for all details, in brief -   CT Angio Chest Pulmonary Embolism (PE) W or WO Contrast Result Date: 06/03/2024 EXAM: CTA of the Chest with contrast for PE 06/03/2024 02:26:03 AM TECHNIQUE: CTA of the chest was performed without and with the administration of 75 mL of iohexol  (OMNIPAQUE ) 350 MG/ML injection. Multiplanar reformatted images are provided for review. MIP images are provided for review. Automated exposure control, iterative reconstruction, and/or weight based adjustment of the mA/kV was utilized to reduce the radiation dose to as low as reasonably achievable. COMPARISON: 06/02/2024 CLINICAL HISTORY: Pulmonary embolism (PE) suspected, low to intermediate prob, positive D-dimer. FINDINGS: PULMONARY ARTERIES: Pulmonary arteries are  adequately opacified for evaluation. No pulmonary embolism. Main pulmonary artery is normal in caliber. MEDIASTINUM: The heart and pericardium demonstrate no acute abnormality. There is no acute abnormality of the thoracic aorta. LYMPH NODES: No mediastinal, hilar or axillary lymphadenopathy. LUNGS AND PLEURA: Areas of linear airspace densities throughout the lungs bilaterally with scattered mosaic attenuation. Findings are similar to earlier study. No pleural effusion or pneumothorax. UPPER ABDOMEN: Limited images of the upper abdomen are unremarkable. SOFT TISSUES AND BONES: No acute bone or soft tissue abnormality. IMPRESSION: 1. No pulmonary embolism. 2. Bilateral linear airspace densities with scattered mosaic attenuation, similar to prior. Electronically signed by: Franky Crease MD MD 06/03/2024 02:42 AM EST RP Workstation: [id number]   CT CHEST ABDOMEN PELVIS WO CONTRAST Result Date: 06/02/2024 CLINICAL DATA:  Sepsis EXAM: CT CHEST, ABDOMEN AND PELVIS WITHOUT CONTRAST TECHNIQUE: Multidetector CT imaging of the chest, abdomen and pelvis was performed following the standard protocol without IV contrast. RADIATION DOSE REDUCTION: This exam was performed according to the departmental dose-optimization program which includes automated exposure control, adjustment of the mA and/or kV according to patient size and/or use of iterative reconstruction technique. COMPARISON:  Chest x-ray 06/02/2024, CT 01/15/2024 FINDINGS: CT CHEST FINDINGS Cardiovascular: Limited without intravenous contrast. Mild aortic atherosclerosis. No aneurysm. Mild cardiomegaly. No pericardial effusion Mediastinum/Nodes: Patent trachea. No thyroid  mass. No suspicious lymph nodes. Esophagus within normal limits. Lungs/Pleura: No pleural effusion or pneumothorax. Mild mosaic density within the bilateral lungs. Bandlike densities at the right middle lobe, lingula and lower lobes, favored to represent atelectasis. No consolidative airspace disease.  Musculoskeletal: Sternum appears intact. No acute osseous abnormality CT ABDOMEN PELVIS FINDINGS Hepatobiliary: No focal liver abnormality is seen. No gallstones, gallbladder wall thickening, or biliary dilatation. Pancreas: Unremarkable. No pancreatic ductal dilatation or surrounding inflammatory changes. Spleen: Normal in size without focal abnormality. Adrenals/Urinary Tract: Adrenal glands are stable in appearance with left 2 cm nodule consistent with adenoma. Small nonobstructing  left kidney stone. Distended urinary bladder with moderate air. Lobulated bladder contour with bladder diverticula. Stomach/Bowel: Stomach within normal limits. No dilated small bowel. No acute bowel wall thickening. Vascular/Lymphatic: Aortic atherosclerosis. No enlarged abdominal or pelvic lymph nodes. Reproductive: Uterus and bilateral adnexa are unremarkable. Other: No ascites or free air Musculoskeletal: No acute or suspicious osseous abnormality IMPRESSION: 1. Mild mosaic density within the bilateral lungs, nonspecific, but can be seen with small airways disease. Bandlike densities at the right middle lobe, lingula and lower lobes, favored to represent atelectasis. 2. Distended urinary bladder with moderate air. Correlate for recent instrumentation, if no history of this, consider gas-forming UTI. Lobulated bladder contour with bladder diverticula. 3. Small nonobstructing left kidney stone. 4. Aortic atherosclerosis. Aortic Atherosclerosis (ICD10-I70.0). Electronically Signed   By: Luke Bun M.D.   On: 06/02/2024 23:18   CT HEAD WO CONTRAST Result Date: 06/02/2024 CLINICAL DATA:  Altered mental status. EXAM: CT HEAD WITHOUT CONTRAST TECHNIQUE: Contiguous axial images were obtained from the base of the skull through the vertex without intravenous contrast. RADIATION DOSE REDUCTION: This exam was performed according to the departmental dose-optimization program which includes automated exposure control, adjustment of the mA  and/or kV according to patient size and/or use of iterative reconstruction technique. COMPARISON:  November 19, 2022 FINDINGS: Brain: No evidence of acute infarction, hemorrhage, hydrocephalus, extra-axial collection or mass lesion/mass effect. Vascular: No hyperdense vessel or unexpected calcification. Skull: Normal. Negative for fracture or focal lesion. Sinuses/Orbits: No acute finding. Other: None. IMPRESSION: No acute intracranial pathology. Electronically Signed   By: Suzen Dials M.D.   On: 06/02/2024 21:15   DG Chest Port 1 View Result Date: 06/02/2024 EXAM: 1 VIEW(S) XRAY OF THE CHEST 06/02/2024 08:13:00 PM COMPARISON: 05/30/2024 CLINICAL HISTORY: Altered altered. FINDINGS: LUNGS AND PLEURA: Vascular congestion and interstitial prominence, which could reflect interstitial edema. No focal pulmonary opacity. No pleural effusion. No pneumothorax. HEART AND MEDIASTINUM: Cardiomegaly. BONES AND SOFT TISSUES: No acute osseous abnormality. IMPRESSION: 1. Cardiomegaly. 2. Vascular congestion and interstitial prominence, which could reflect interstitial edema. Electronically signed by: Franky Crease MD MD 06/02/2024 08:21 PM EST RP Workstation: [id number]   DG Chest Port 1 View Result Date: 05/30/2024 EXAM: 1 VIEW(S) XRAY OF THE CHEST 05/30/2024 12:28:00 PM COMPARISON: 05/17/2024 CLINICAL HISTORY: recent pna - weak FINDINGS: LUNGS AND PLEURA: Decreased conspicuity of left retrocardiac opacities. No pleural effusion. No pneumothorax. HEART AND MEDIASTINUM: No acute abnormality of the cardiac and mediastinal silhouettes. Aortic calcification. BONES AND SOFT TISSUES: No acute osseous abnormality. IMPRESSION: 1. Decreased conspicuity of left retrocardiac opacities, compared to 05/17/2024. 2. Aortic atherosclerosis. Electronically signed by: Evalene Coho MD 05/30/2024 12:32 PM EST RP Workstation: [id number]   US  Venous Img Lower Unilateral Left (DVT) Result Date: 05/17/2024 CLINICAL DATA:  edema.  SHORTNESS OF  BREATH. EXAM: LEFT LOWER EXTREMITY VENOUS DOPPLER ULTRASOUND TECHNIQUE: Gray-scale sonography with compression, as well as color and duplex ultrasound, were performed to evaluate the deep venous system(s) from the level of the common femoral vein through the popliteal and proximal calf veins. COMPARISON:  CHEST XR, CONCURRENT. FINDINGS: VENOUS Normal compressibility of the common femoral, superficial femoral, and popliteal veins, as well as the visualized calf veins. Visualized portions of profunda femoral vein and great saphenous vein unremarkable. No filling defects to suggest DVT on grayscale or color Doppler imaging. Doppler waveforms show normal direction of venous flow, normal respiratory plasticity and response to augmentation. Limited views of the contralateral common femoral vein are unremarkable. OTHER No evidence of superficial thrombophlebitis or  abnormal fluid collection. Limitations: none IMPRESSION: No evidence of femoropopliteal DVT or superficial thrombophlebitis within the LEFT lower extremity. Electronically Signed   By: Thom Hall M.D.   On: 05/17/2024 12:58   DG Chest Port 1 View Result Date: 05/17/2024 CLINICAL DATA:  Shortness of breath. EXAM: PORTABLE CHEST 1 VIEW COMPARISON:  01/15/2024 FINDINGS: The heart size and mediastinal contours are within normal limits. Increased patchy opacities in left retrocardiac lung base, suspicious for infiltrate. No pleural effusion. IMPRESSION: Possible new left retrocardiac infiltrate. Continued chest radiographic follow-up recommended. Electronically Signed   By: Norleen DELENA Kil M.D.   On: 05/17/2024 11:43   Today   Subjective    Monita Toutant today has no new complaints - Mood is stabilized, patient is cooperative   Patient has been seen and examined prior to discharge   Objective   Blood pressure 113/63, pulse 88, temperature 98.2 F (36.8 C), temperature source Oral, resp. rate 18, SpO2 91%.   Intake/Output Summary (Last 24 hours) at  06/16/2024 1045 Last data filed at 06/16/2024 0916 Gross per 24 hour  Intake 1200 ml  Output 1900 ml  Net -700 ml    Exam Gen:- Awake Alert, no acute distress  HEENT:- [place].AT, No sclera icterus Neck-Supple Neck,No JVD,.  Lungs-  CTAB , good air movement bilaterally CV- S1, S2 normal, regular Abd-  +ve B.Sounds, Abd Soft, No tenderness,    Extremity/Skin:- No  edema,   good pulses Psych-affect is appropriate, Episodes of occasional confusion or disorientation, overall cooperative, no agitation Neuro-no new focal deficits, no tremors    Data Review   CBC w Diff:  Lab Results  Component Value Date   WBC 5.8 06/05/2024   HGB 10.7 (L) 06/05/2024   HCT 34.7 (L) 06/05/2024   PLT 151 06/05/2024   LYMPHOPCT 24 06/02/2024   MONOPCT 8 06/02/2024   EOSPCT 4 06/02/2024   BASOPCT 0 06/02/2024    CMP:  Lab Results  Component Value Date   NA 143 06/05/2024   K 3.9 06/05/2024   CL 102 06/05/2024   CO2 39 (H) 06/05/2024   BUN 21 06/05/2024   CREATININE 1.25 (H) 06/10/2024   PROT 5.8 (L) 06/03/2024   ALBUMIN 3.5 06/03/2024   BILITOT 0.3 06/03/2024   ALKPHOS 141 (H) 06/03/2024   AST 25 06/03/2024   ALT 18 06/03/2024   Total Discharge time is about 33 minutes  Rendall Carwin M.D on 06/16/2024 at 10:45 AM  Go to [email or web address] -  for contact info  [facility] - Office  [phone]   "

## 2024-06-16 NOTE — Progress Notes (Signed)
 Mobility Specialist Progress Note:    06/16/24 1150  Mobility  Activity Pivoted/transferred from bed to chair  Level of Assistance Minimal assist, patient does 75% or more  Assistive Device Front wheel walker  Distance Ambulated (ft) 4 ft  Range of Motion/Exercises Active;All extremities  Activity Response Tolerated well  Mobility Referral Yes  Mobility visit 1 Mobility  Mobility Specialist Start Time (ACUTE ONLY) 1150  Mobility Specialist Stop Time (ACUTE ONLY) 1202  Mobility Specialist Time Calculation (min) (ACUTE ONLY) 12 min   Pt received sitting EOB, NT requesting assistance transferring to chair. Required MinA to stand and CGA to transfer with RW. Tolerated well ,confused and required verbal cues during transfer. NT in room, all needs met.  Gilmer Kaminsky Mobility Specialist Please contact via Special Educational Needs Teacher or  Rehab office at [phone]

## 2024-06-16 NOTE — TOC Transition Note (Addendum)
 Transition of Care [facility]) - Discharge Note   Patient Details  Name: Carrie Clark MRN: [id number] Date of Birth: [birth year]  Transition of Care [facility]) CM/SW Contact:  Hoy DELENA Bigness, LCSW Phone Number: 06/16/2024, 11:02 AM   Clinical Narrative:    Pt's PASRR returned: [id number] F. Pt's insurance auth approved for SNF. [facility] able to accept today.  Pt will be going to room A15-1 at [facility]. RN to call report to [phone]. Pt's legal guardian is aware and agreeable to discharge plans. Medical necessity form printed to the unit. [facility] called at [phone] for transport.    ADDENDUM: EMS refused to transport pt. Received approval to utilize [employer]. Ride Waiver signed by legal guardian and emailed to transportation. [facility] has been arranged.   Final next level of care: Skilled Nursing Facility Barriers to Discharge: Barriers Resolved   Patient Goals and CMS Choice Patient states their goals for this hospitalization and ongoing recovery are:: return to group home CMS Medicare.gov Compare Post Acute Care list provided to:: Patient Represenative (must comment) Choice offered to / list presented to : [facility] POA / Guardian [place] ownership interest in [facility].provided to:: [facility] POA / Guardian    Discharge Placement PASRR number recieved: 06/16/24            Patient chooses bed at: Other - please specify in the comment section below: [name] Patient to be transferred to facility by: [facility] Name of family member notified: Legal Guardian, Jama Candy Patient and family notified of of transfer: 06/16/24  Discharge Plan and Services Additional resources added to the After Visit Summary for   In-house Referral: Clinical Social Work Discharge Planning Services: CM Consult Post Acute Care Choice: Skilled Nursing Facility          DME Arranged: N/A DME Agency: NA                  Social Drivers of Health (SDOH) Interventions SDOH  Screenings   Food Insecurity: No Food Insecurity (06/05/2024)  Housing: Patient Unable To Answer (06/05/2024)  Transportation Needs: No Transportation Needs (06/05/2024)  Utilities: Not At Risk (06/05/2024)  Tobacco Use: Low Risk (06/03/2024)     Readmission Risk Interventions    06/10/2024   12:47 PM 06/07/2024    2:57 PM 06/04/2024   11:54 AM  Readmission Risk Prevention Plan  Transportation Screening Complete Complete Complete  PCP or Specialist Appt within 3-5 Days Complete    HRI or Home Care Consult Complete Complete Complete  Social Work Consult for Recovery Care Planning/Counseling Complete Complete Complete  Palliative Care Screening Not Applicable Not Applicable Not Applicable  Medication Review Ocean[employer]) Complete Complete Complete

## 2024-06-16 NOTE — Plan of Care (Signed)
" °  Problem: Coping: Goal: Ability to adjust to condition or change in health will improve Outcome: Progressing   Problem: Fluid Volume: Goal: Ability to maintain a balanced intake and output will improve Outcome: Progressing   Problem: Metabolic: Goal: Ability to maintain appropriate glucose levels will improve Outcome: Progressing   Problem: Nutritional: Goal: Maintenance of adequate nutrition will improve Outcome: Progressing   Problem: Skin Integrity: Goal: Risk for impaired skin integrity will decrease Outcome: Progressing   Problem: Safety: Goal: Ability to remain free from injury will improve Outcome: Progressing   "

## 2024-06-16 NOTE — Plan of Care (Signed)
" °  Problem: Coping: Goal: Ability to adjust to condition or change in health will improve Outcome: Progressing   Problem: Fluid Volume: Goal: Ability to maintain a balanced intake and output will improve Outcome: Progressing   Problem: Metabolic: Goal: Ability to maintain appropriate glucose levels will improve Outcome: Progressing   Problem: Nutritional: Goal: Maintenance of adequate nutrition will improve Outcome: Progressing   Problem: Skin Integrity: Goal: Risk for impaired skin integrity will decrease Outcome: Progressing   Problem: Tissue Perfusion: Goal: Adequacy of tissue perfusion will improve Outcome: Progressing   Problem: Clinical Measurements: Goal: Ability to maintain clinical measurements within normal limits will improve Outcome: Progressing Goal: Will remain free from infection Outcome: Progressing Goal: Diagnostic test results will improve Outcome: Progressing Goal: Respiratory complications will improve Outcome: Progressing   Problem: Activity: Goal: Risk for activity intolerance will decrease Outcome: Progressing   Problem: Coping: Goal: Level of anxiety will decrease Outcome: Progressing   Problem: Elimination: Goal: Will not experience complications related to bowel motility Outcome: Progressing Goal: Will not experience complications related to urinary retention Outcome: Progressing   Problem: Safety: Goal: Ability to remain free from injury will improve Outcome: Progressing   Problem: Skin Integrity: Goal: Risk for impaired skin integrity will decrease Outcome: Progressing   "

## 2024-06-17 NOTE — Care Management Important Message (Signed)
 Important Message  Patient Details  Name: Carrie Clark MRN: [id number] Date of Birth: 1960/12/03   Important Message Given:  Yes - Medicare IM (late entry, letter mailed on 06/16/24)     Jashun Puertas L Lakeitha Basques 06/17/2024, 1:09 PM
